# Patient Record
Sex: Female | Born: 1960 | Race: White | Hispanic: No | Marital: Married | Smoking: Former smoker
Health system: Southern US, Community
[De-identification: ages and names within clinical notes are randomized; demographics above are authoritative.]

## PROBLEM LIST (undated history)

## (undated) DIAGNOSIS — U071 COVID-19: Secondary | ICD-10-CM

## (undated) DIAGNOSIS — J309 Allergic rhinitis, unspecified: Principal | ICD-10-CM

## (undated) DIAGNOSIS — B019 Varicella without complication: Secondary | ICD-10-CM

## (undated) DIAGNOSIS — R490 Dysphonia: Principal | ICD-10-CM

## (undated) DIAGNOSIS — M069 Rheumatoid arthritis, unspecified: Secondary | ICD-10-CM

## (undated) DIAGNOSIS — H6980 Other specified disorders of Eustachian tube, unspecified ear: Secondary | ICD-10-CM

## (undated) DIAGNOSIS — I10 Essential (primary) hypertension: Secondary | ICD-10-CM

## (undated) DIAGNOSIS — N393 Stress incontinence (female) (male): Secondary | ICD-10-CM

## (undated) DIAGNOSIS — R739 Hyperglycemia, unspecified: Secondary | ICD-10-CM

## (undated) DIAGNOSIS — Z0000 Encounter for general adult medical examination without abnormal findings: Secondary | ICD-10-CM

## (undated) DIAGNOSIS — G43109 Migraine with aura, not intractable, without status migrainosus: Secondary | ICD-10-CM

## (undated) DIAGNOSIS — K219 Gastro-esophageal reflux disease without esophagitis: Secondary | ICD-10-CM

## (undated) DIAGNOSIS — N3281 Overactive bladder: Secondary | ICD-10-CM

## (undated) DIAGNOSIS — E213 Hyperparathyroidism, unspecified: Secondary | ICD-10-CM

## (undated) DIAGNOSIS — E785 Hyperlipidemia, unspecified: Secondary | ICD-10-CM

## (undated) DIAGNOSIS — R05 Cough: Secondary | ICD-10-CM

## (undated) DIAGNOSIS — R51 Headache: Secondary | ICD-10-CM

## (undated) DIAGNOSIS — F4310 Post-traumatic stress disorder, unspecified: Secondary | ICD-10-CM

## (undated) DIAGNOSIS — R319 Hematuria, unspecified: Secondary | ICD-10-CM

## (undated) DIAGNOSIS — R000 Tachycardia, unspecified: Secondary | ICD-10-CM

## (undated) DIAGNOSIS — C50919 Malignant neoplasm of unspecified site of unspecified female breast: Secondary | ICD-10-CM

## (undated) DIAGNOSIS — E669 Obesity, unspecified: Secondary | ICD-10-CM

## (undated) DIAGNOSIS — Z87891 Personal history of nicotine dependence: Secondary | ICD-10-CM

## (undated) DIAGNOSIS — R945 Abnormal results of liver function studies: Secondary | ICD-10-CM

## (undated) HISTORY — DX: Encounter for general adult medical examination without abnormal findings: Z00.00

## (undated) HISTORY — DX: COVID-19: U07.1

## (undated) HISTORY — PX: OTHER SURGICAL HISTORY: SHX169

## (undated) HISTORY — PX: BREAST EXCISIONAL BIOPSY: SUR124

## (undated) HISTORY — DX: Headache: R51

## (undated) HISTORY — PX: FOOT SURGERY: SHX648

## (undated) HISTORY — DX: Hyperglycemia, unspecified: R73.9

## (undated) HISTORY — DX: Post-traumatic stress disorder, unspecified: F43.10

## (undated) HISTORY — DX: Malignant neoplasm of unspecified site of unspecified female breast: C50.919

## (undated) HISTORY — DX: Varicella without complication: B01.9

## (undated) HISTORY — DX: Allergic rhinitis, unspecified: J30.9

## (undated) HISTORY — DX: Rheumatoid arthritis, unspecified: M06.9

## (undated) HISTORY — DX: Dysphonia: R49.0

## (undated) HISTORY — PX: TUBAL LIGATION: SHX77

## (undated) HISTORY — DX: Migraine with aura, not intractable, without status migrainosus: G43.109

## (undated) HISTORY — DX: Stress incontinence (female) (male): N39.3

## (undated) HISTORY — PX: WISDOM TOOTH EXTRACTION: SHX21

## (undated) HISTORY — DX: Hyperlipidemia, unspecified: E78.5

## (undated) HISTORY — PX: BREAST SURGERY: SHX581

## (undated) HISTORY — DX: Gastro-esophageal reflux disease without esophagitis: K21.9

## (undated) HISTORY — DX: Hematuria, unspecified: R31.9

## (undated) HISTORY — DX: Personal history of nicotine dependence: Z87.891

## (undated) HISTORY — DX: Cough: R05

## (undated) HISTORY — DX: Overactive bladder: N32.81

## (undated) HISTORY — DX: Obesity, unspecified: E66.9

## (undated) HISTORY — DX: Other specified disorders of Eustachian tube, unspecified ear: H69.80

## (undated) HISTORY — DX: Tachycardia, unspecified: R00.0

## (undated) HISTORY — DX: Abnormal results of liver function studies: R94.5

## (undated) HISTORY — DX: Essential (primary) hypertension: I10

---

## 2000-04-30 ENCOUNTER — Other Ambulatory Visit: Admission: RE | Admit: 2000-04-30 | Discharge: 2000-04-30 | Payer: Self-pay | Admitting: Obstetrics & Gynecology

## 2001-03-19 ENCOUNTER — Ambulatory Visit (HOSPITAL_COMMUNITY): Admission: RE | Admit: 2001-03-19 | Discharge: 2001-03-19 | Payer: Self-pay | Admitting: General Surgery

## 2001-06-23 ENCOUNTER — Other Ambulatory Visit: Admission: RE | Admit: 2001-06-23 | Discharge: 2001-06-23 | Payer: Self-pay | Admitting: Family Medicine

## 2002-08-10 ENCOUNTER — Other Ambulatory Visit: Admission: RE | Admit: 2002-08-10 | Discharge: 2002-08-10 | Payer: Self-pay | Admitting: Obstetrics and Gynecology

## 2003-07-27 ENCOUNTER — Encounter: Admission: RE | Admit: 2003-07-27 | Discharge: 2003-07-27 | Payer: Self-pay | Admitting: General Surgery

## 2003-08-23 ENCOUNTER — Other Ambulatory Visit: Admission: RE | Admit: 2003-08-23 | Discharge: 2003-08-23 | Payer: Self-pay | Admitting: Obstetrics and Gynecology

## 2004-01-05 ENCOUNTER — Ambulatory Visit (HOSPITAL_COMMUNITY): Admission: RE | Admit: 2004-01-05 | Discharge: 2004-01-05 | Payer: Self-pay | Admitting: Obstetrics and Gynecology

## 2004-03-10 HISTORY — PX: PELVIC LAPAROSCOPY: SHX162

## 2004-10-03 ENCOUNTER — Encounter: Admission: RE | Admit: 2004-10-03 | Discharge: 2004-10-03 | Payer: Self-pay | Admitting: Family Medicine

## 2004-12-09 ENCOUNTER — Other Ambulatory Visit: Admission: RE | Admit: 2004-12-09 | Discharge: 2004-12-09 | Payer: Self-pay | Admitting: Obstetrics and Gynecology

## 2006-01-16 ENCOUNTER — Encounter: Admission: RE | Admit: 2006-01-16 | Discharge: 2006-01-16 | Payer: Self-pay | Admitting: Obstetrics and Gynecology

## 2006-03-10 LAB — HM COLONOSCOPY

## 2006-08-14 ENCOUNTER — Emergency Department (HOSPITAL_COMMUNITY): Admission: EM | Admit: 2006-08-14 | Discharge: 2006-08-14 | Payer: Self-pay | Admitting: Emergency Medicine

## 2008-03-22 ENCOUNTER — Encounter: Admission: RE | Admit: 2008-03-22 | Discharge: 2008-03-22 | Payer: Self-pay | Admitting: Obstetrics and Gynecology

## 2008-06-16 ENCOUNTER — Encounter: Payer: Self-pay | Admitting: Cardiovascular Disease

## 2008-09-28 ENCOUNTER — Ambulatory Visit: Payer: Self-pay | Admitting: Sports Medicine

## 2008-09-28 DIAGNOSIS — M216X9 Other acquired deformities of unspecified foot: Secondary | ICD-10-CM | POA: Insufficient documentation

## 2008-09-28 DIAGNOSIS — IMO0002 Reserved for concepts with insufficient information to code with codable children: Secondary | ICD-10-CM | POA: Insufficient documentation

## 2008-09-28 DIAGNOSIS — M25569 Pain in unspecified knee: Secondary | ICD-10-CM | POA: Insufficient documentation

## 2008-10-05 ENCOUNTER — Ambulatory Visit: Payer: Self-pay | Admitting: Sports Medicine

## 2009-04-17 ENCOUNTER — Encounter: Admission: RE | Admit: 2009-04-17 | Discharge: 2009-04-17 | Payer: Self-pay | Admitting: Obstetrics and Gynecology

## 2009-08-17 ENCOUNTER — Encounter: Payer: Self-pay | Admitting: Cardiovascular Disease

## 2010-03-10 LAB — HM PAP SMEAR

## 2010-03-20 ENCOUNTER — Encounter
Admission: RE | Admit: 2010-03-20 | Discharge: 2010-03-20 | Payer: Self-pay | Source: Home / Self Care | Attending: Obstetrics and Gynecology | Admitting: Obstetrics and Gynecology

## 2010-03-31 ENCOUNTER — Encounter: Payer: Self-pay | Admitting: Obstetrics and Gynecology

## 2010-04-11 NOTE — Assessment & Plan Note (Signed)
Summary: FU KNEE PAIN/JW   Vital Signs:  Patient profile:   50 year old female Height:      66 inches Weight:      170 pounds BMI:     27.54 BP sitting:   100 / 72  Vitals Entered By: Lillia Pauls CMA (October 05, 2008 10:16 AM)  History of Present Illness: Patient is much better after bilat poes anserine injections last week she wants to get back to exercise she notes first day she cut her work shift to 8 hrs by second day able to go 12 hrs now walking and has done a little of elliptical without much pain has not run  comes for reck  Physical Exam  General:  Well-developed,well-nourished,in no acute distress; alert,appropriate and cooperative throughout examination Msk:  RT knee exam shows no effusion; stable ligaments; negative Mcmurray's and provocative meniscal tests; non painful patellar compression; patellar and quadriceps tendons unremarkable mild swellin over pes anserine bursa persists with slight TTP  LT knee exam shows no effusion; stable ligaments; negative Mcmurray's and provocative meniscal tests; non painful patellar compression; patellar and quadriceps tendons unremarkable also with some swelling over pes anserine but not warm and minimal TTP   good strength on hip adduction, abduction and flesion  good quad strength  sartorius strength slighlty donw bue ? 2/2 pain   Impression & Recommendations:  Problem # 1:  KNEE PAIN, BILATERAL (ICD-719.46) This has resolved down to a mild level 2/10 or so does not hurt a lot of time too much standing or exercise will bring this out  Problem # 2:  ANSERINE BURSITIS (ICD-726.61) Much improived signs wise after injections still slight swelling  will give easy exercise program hamstrings quads sartorius hip abductors and adductors  she may choose to go back to elliptical primarily  cont use somforthotic insoles with scaphoid pads  RTC as needed

## 2010-07-03 ENCOUNTER — Encounter: Payer: Self-pay | Admitting: Cardiovascular Disease

## 2010-07-26 NOTE — Op Note (Signed)
[name], [name]             ACCOUNT NO.:  [id number]   MEDICAL RECORD NO.:  [id number]          PATIENT TYPE:  AMB   LOCATION:  SDC                           FACILITY:  [facility]   PHYSICIAN:  Zenaida Niece, M.D.DATE OF BIRTH:  10-30-1960   DATE OF PROCEDURE:  01/05/2004  DATE OF DISCHARGE:                                 OPERATIVE REPORT   PREOPERATIVE DIAGNOSES:  Pelvic pain.   POSTOPERATIVE DIAGNOSES:  Pelvic pain.   PROCEDURE:  Diagnostic laparoscopy.   SURGEON:  Zenaida Niece, M.D.   ANESTHESIA:  General endotracheal.   ESTIMATED BLOOD LOSS:  Less than 50 mL.   FINDINGS:  She had minimal adhesions of the sigmoid colon to the left pelvic  brim and the omentum was adherent just to beneath the umbilicus. She had a  normal appendix, liver edge and gallbladder.  Pelvic anatomy was otherwise  very normal with evidence of prior tubal ligation.   DESCRIPTION OF PROCEDURE:  The patient was taken to the operating room and  placed in the dorsal supine position.  General anesthesia was induced and  she was placed in mobile stirrups. Abdomen was prepped and draped in the  usual sterile fashion, bladder drained with a red rubber catheter, Hulka  tenaculum applied to the cervix for uterine manipulation. Infraumbilical  skin was then infiltrated with Marcaine 0.25% and a 1-1/2 cm horizontal  incision was made in her previous scar.  The Veress needle was inserted into  the peritoneal cavity and placement confirmed by the water drop test and an  opening pressure of 3 mmHg.  CO2 gas was insufflated to a pressure of 12  mmHg and the Veress needle was removed. The 10/11 disposable trocar was then  introduced and placement confirmed by the laparoscope. Inspection revealed  the above mentioned findings. Both tubes and ovaries, ovarian fossa,  anterior and posterior cul-de-sacs and the uterus appeared very normal. I  was able to find no significant source for her pain. There were a  few  adhesions of the sigmoid to the left pelvic brim and these were taken down  with scissors.  Anatomy otherwise looked normal. I was able to just  visualize an adhesion of the omentum to the left of the umbilicus. The  laparoscope was removed and all gas allowed to deflate from the abdomen. The  umbilical trocar was removed. The fascia was closed with a figure-of-eight  suture of #0 Vicryl and skin was closed with interrupted subcuticular  sutures of 4-0 Vicryl followed by Steri-Strips and a Band-Aid.  Hulka  tenaculum was then removed from the cervix. The patient was extubated in the  operating room and taken to the recovery room in stable condition after  tolerating the procedure well.  Counts were correct and she had PAS hose on  throughout the procedure.     Todd   TDM/MEDQ  D:  01/05/2004  T:  01/05/2004  Job:  [id number]

## 2010-07-26 NOTE — Op Note (Signed)
[facility]  Patient:    Sandra [name], Sandra [name] Visit Number: [id number] MRN: [id number]          Service Type: DSU Location: DAY Attending Physician:  Cherylynn Ridges Proc. 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  PROCEDURE:  Fistulectomy.  SURGEON:  Jimmye Norman, M.D.  ASSISTANT:  None.  ANESTHESIA:  General endotracheal.  ESTIMATED BLOOD LOSS:  Less than 10 cc.  COMPLICATIONS:  None.  CONDITION:  Stable.  INDICATIONS FOR OPERATION:  The patient is a 50 year old female where for a current abscess in the 7 oclock position, currently responding to antibiotics.  She now comes in for fistula in ano repair.  FINDINGS:  The patient had a indurated cavity at the 7 oclock position, which was open and tracked into sinus that coursed somewhat superiorly and then laterally along the patients left side, up to an anal crypt at approximately the 10 oclock position.  DESCRIPTION OF OPERATION:  The patient was taken to the operating room and placed on the table initially in the supine position.  After an adequate endotracheal anesthetic was administered, she was flipped into the jackknife prone position for surgery.  She was then prepped in the usual sterile manner.  An anal retractor was placed as we inspected the indurated fluctuant area in the 7 oclock position.  Using an anal hook, we could identify a crypt perforation only at approximately 10 oclock position.  We opened the cavity externally on the skin and the perianal area; we were able to get into a cavity which coursed superiorly and then somewhat to the patients left laterally, up to an area identified by the  crypt and approximately in the 10 oclock position.   This whole tract was scarred and indurated, and we used a curet to clean out the scarred out until we had opened up the tract using electrocautery.  We obtained hemostasis with cautery and also with a suture ligature of 3-0 Vicryl and 3-0 chromic.  The entire indurated tract was scraped with a curet.  Care was taken to spare the external sphincter muscle; however, we did probably cut across at least in one spot.  The length of the opening was approximately 3 cm long.  We irrigated with saline, then we packed it with the Gelfoam wrapped in bupivacaine.  All sponge, needle and instrument counts were correct.  We also injected .25% Marcaine with epinephrine.Attending Physician:  Cherylynn Ridges DD:  03/19/01 TD:  03/20/01 Job: [id number]

## 2010-07-26 NOTE — H&P (Signed)
Sandra [name], MODESITT             ACCOUNT NO.:  [id number]   MEDICAL RECORD NO.:  [id number]          PATIENT TYPE:  AMB   LOCATION:  SDC                           FACILITY:  [facility]   PHYSICIAN:  Zenaida Niece, M.D.DATE OF BIRTH:  06-23-1960   DATE OF ADMISSION:  DATE OF DISCHARGE:                                HISTORY & PHYSICAL   DATE OF ADMISSION:  January 05, 2004   CHIEF COMPLAINT:  Pelvic pain.   HISTORY OF PRESENT ILLNESS:  This is a 50 year old white female para 2-0-1-2  whom I first saw in June 2005 for pelvic pain.  At that time she had had  pain for approximately 2 months that was fairly constant and worse with  prolonged sitting, and worse with a full rectum and slightly better after  bowel movements.  She also had some perineal pressure.  She had no  dyspareunia.  Exam revealed a slightly tender deep left posterior fornix but  an otherwise normal exam.  She was then treated with Lupron Depot 3 month  11.25 mg for the possibility of endometriosis.  She was treated with Prempro  0.3/1.5 for menopausal symptoms from the Lupron.  On follow-up on September  29 she reported that she had initially a fair amount of improvement in her  pain but now there was minimal improvement in her pain.  All options were  discussed and the patient wishes to proceed with laparoscopic evaluation.   PAST MEDICAL HISTORY:  Significant for osteopenia and a history of  microscopic hematuria for which she has been seen by Dr. Vernie Ammons.   OBSTETRICAL HISTORY:  One spontaneous abortion treated with a D&C and two  vaginal deliveries at term without complications.   SURGERIES:  Tubal ligation, perineal fistula repair, and left breast biopsy.   ALLERGIES:  PENICILLIN and SULFA.   CURRENT MEDICATIONS:  Prempro 0.45/1.5.   GYNECOLOGICAL HISTORY:  No history of abnormal Pap smears.   SOCIAL HISTORY:  The patient is married, has quit smoking, and denies  alcohol or drug use.  The patient is an OR  nurse at [employer].   FAMILY HISTORY:  Maternal great aunt with breast cancer and her mother had  renal cell carcinoma.   PHYSICAL EXAMINATION:  GENERAL:  This is a well-developed white female in no  acute distress.  VITAL SIGNS:  Weight is 156 pounds.  Vital signs are stable.  NECK:  Supple without lymphadenopathy or thyromegaly.  LUNGS:  Clear to auscultation.  HEART:  Regular rate and rhythm without murmur.  ABDOMEN:  Soft, nontender, nondistended, without palpable masses.  EXTREMITIES:  No edema and are nontender.  PELVIC:  External genitalia is normal.  On speculum exam, cervix is normal  and Pap smear was performed which has returned normal.  On bimanual exam,  she has a small, anteverted, nontender uterus and no palpable masses.  However, she is tender in the left posterior cul-de-sac.   ASSESSMENT:  Chronic pelvic pain which has not responded to Lupron Depot.  All surgical and nonsurgical options have been discussed with the patient  and she wishes to proceed  with laparoscopic evaluation.  Risks of surgery  including bleeding, infection, and damage to surrounding organs have been  discussed and she understands.   PLAN:  Admit the patient for a diagnostic laparoscopy with possible  adhesiolysis and possible fulguration of endometriosis.     Todd   TDM/MEDQ  D:  01/04/2004  T:  01/04/2004  Job:  [id number]

## 2010-09-16 ENCOUNTER — Ambulatory Visit: Payer: 59 | Attending: Orthopedic Surgery | Admitting: Physical Therapy

## 2010-09-16 DIAGNOSIS — M25669 Stiffness of unspecified knee, not elsewhere classified: Secondary | ICD-10-CM | POA: Insufficient documentation

## 2010-09-16 DIAGNOSIS — M25569 Pain in unspecified knee: Secondary | ICD-10-CM | POA: Insufficient documentation

## 2010-09-16 DIAGNOSIS — IMO0001 Reserved for inherently not codable concepts without codable children: Secondary | ICD-10-CM | POA: Insufficient documentation

## 2010-09-18 ENCOUNTER — Ambulatory Visit: Payer: 59 | Admitting: Rehabilitation

## 2010-09-25 ENCOUNTER — Ambulatory Visit: Payer: 59 | Admitting: Physical Therapy

## 2010-09-26 ENCOUNTER — Encounter: Payer: Commercial Managed Care - PPO | Admitting: Rehabilitation

## 2010-09-30 ENCOUNTER — Ambulatory Visit: Payer: 59 | Admitting: Physical Therapy

## 2010-10-02 ENCOUNTER — Ambulatory Visit: Payer: 59 | Admitting: Rehabilitation

## 2010-10-07 ENCOUNTER — Ambulatory Visit: Payer: 59 | Admitting: Rehabilitation

## 2010-10-08 ENCOUNTER — Ambulatory Visit: Payer: 59 | Admitting: Rehabilitation

## 2010-10-15 ENCOUNTER — Ambulatory Visit: Payer: 59 | Attending: Family Medicine | Admitting: Rehabilitation

## 2010-10-15 DIAGNOSIS — IMO0001 Reserved for inherently not codable concepts without codable children: Secondary | ICD-10-CM | POA: Insufficient documentation

## 2010-10-15 DIAGNOSIS — M25569 Pain in unspecified knee: Secondary | ICD-10-CM | POA: Insufficient documentation

## 2010-10-15 DIAGNOSIS — M25669 Stiffness of unspecified knee, not elsewhere classified: Secondary | ICD-10-CM | POA: Insufficient documentation

## 2010-10-16 ENCOUNTER — Ambulatory Visit: Payer: 59 | Admitting: Rehabilitation

## 2010-12-26 LAB — CBC
HCT: 38.5
Hemoglobin: 13.1
MCHC: 33.9
MCV: 88.3
Platelets: 235
RBC: 4.36
RDW: 13.2
WBC: 4.4

## 2010-12-26 LAB — POCT CARDIAC MARKERS
CKMB, poc: 1.5
Myoglobin, poc: 57.1
Operator id: 4295
Troponin i, poc: <0.05

## 2010-12-26 LAB — DIFFERENTIAL
Basophils Absolute: 0
Basophils Relative: 0
Eosinophils Absolute: 0
Eosinophils Relative: 0
Lymphocytes Relative: 32
Lymphs Abs: 1.4
Monocytes Absolute: 0.7
Monocytes Relative: 17 — ABNORMAL HIGH
Neutro Abs: 2.3
Neutrophils Relative %: 51

## 2010-12-26 LAB — COMPREHENSIVE METABOLIC PANEL
ALT: 22
AST: 21
Albumin: 3.9
Alkaline Phosphatase: 61
BUN: 8
CO2: 27
Calcium: 10
Chloride: 106
Creatinine, Ser: 0.72
GFR calc Af Amer: >60
GFR calc non Af Amer: >60
Glucose, Bld: 93
Potassium: 3.8
Sodium: 138
Total Bilirubin: 0.6
Total Protein: 6.7

## 2010-12-26 LAB — D-DIMER, QUANTITATIVE: D-Dimer, Quant: <0.22

## 2011-01-01 ENCOUNTER — Encounter: Payer: Self-pay | Admitting: Cardiovascular Disease

## 2011-01-02 ENCOUNTER — Encounter: Payer: Self-pay | Admitting: Cardiovascular Disease

## 2011-01-02 ENCOUNTER — Ambulatory Visit (INDEPENDENT_AMBULATORY_CARE_PROVIDER_SITE_OTHER): Payer: 59 | Admitting: Cardiovascular Disease

## 2011-01-02 VITALS — BP 120/80 | HR 80 | Resp 18 | Ht 65.0 in | Wt 162.8 lb

## 2011-01-02 DIAGNOSIS — I10 Essential (primary) hypertension: Secondary | ICD-10-CM

## 2011-01-02 DIAGNOSIS — E785 Hyperlipidemia, unspecified: Secondary | ICD-10-CM

## 2011-01-02 DIAGNOSIS — R072 Precordial pain: Secondary | ICD-10-CM

## 2011-01-02 DIAGNOSIS — R079 Chest pain, unspecified: Secondary | ICD-10-CM

## 2011-01-02 NOTE — Patient Instructions (Signed)
Your physician recommends that you schedule a follow-up appointment as needed.   Your physician has requested that you have a stress echocardiogram. For further information please visit [email or web address] Please follow instruction sheet as given.

## 2011-01-03 ENCOUNTER — Encounter: Payer: Self-pay | Admitting: Cardiovascular Disease

## 2011-01-03 DIAGNOSIS — I10 Essential (primary) hypertension: Secondary | ICD-10-CM | POA: Insufficient documentation

## 2011-01-03 DIAGNOSIS — E785 Hyperlipidemia, unspecified: Secondary | ICD-10-CM | POA: Insufficient documentation

## 2011-01-03 DIAGNOSIS — R0789 Other chest pain: Secondary | ICD-10-CM | POA: Insufficient documentation

## 2011-01-03 NOTE — Assessment & Plan Note (Signed)
Blood pressure is well controlled on lisinopril hydrochlorothiazide

## 2011-01-03 NOTE — Assessment & Plan Note (Signed)
The patient is on a statin drug. I do not have a copy of her lipids but she tells me they are well treated.

## 2011-01-03 NOTE — Progress Notes (Signed)
HPI:  This is a 49 year old woman presenting for initial evaluation of chest pain. She describes progressive symptoms of right sided chest and shoulder discomfort that feels like a "pressure."  The patient also describes this as a "dull pain." The pain is not affected by arm movement, coughing, or deep breathing. It is not clearly exertional in nature but occasionally occurs with stress and exertion. She denies substernal or left sided chest pain. However, she recently had an episode with a "squeezing" sensation in her left upper arm other associated symptoms include palpitations, headaches, lightheadedness, and shortness of breath. All these symptoms have been frequent over the past month. The patient has had no evaluation of these symptoms up until now. She works as an Producer, television/film/video at [employer].  Outpatient Encounter Prescriptions as of 01/02/2011  Medication Sig Dispense Refill  . aspirin 81 MG tablet Take 81 mg by mouth daily.        Marland Kitchen lisinopril-hydrochlorothiazide (PRINZIDE,ZESTORETIC) 10-12.5 MG per tablet Take 1 tablet by mouth daily.        . simvastatin (ZOCOR) 40 MG tablet Take 40 mg by mouth at bedtime.          Penicillins  Past Medical History  Diagnosis Date  . Hypertension   . Chest pain   . Hyperlipidemia   . Rapid heart beat     No past surgical history on file.  History   Social History  . Marital Status: Married    Spouse Name: N/A    Number of Children: 2  . Years of Education: N/A   Occupational History  . RN [employer]   Social History Main Topics  . Smoking status: Former Smoker    Quit date: 03/10/2005  . Smokeless tobacco: Not on file  . Alcohol Use: No  . Drug Use: No  . Sexually Active: Not on file   Other Topics Concern  . Not on file   Social History Narrative  . No narrative on file    Family History  Problem Relation Age of Onset  . Hypertension Mother 30    alive  . Hypertension Father 85    alive  . Arrhythmia  Father   . Hypertension Brother 56    alive  . Hypertension Sister     2    ROS: General: no fevers/chills/night sweats, positive for hot flashes Eyes: no blurry vision, diplopia, or amaurosis ENT: no sore throat or hearing loss, positive for seasonal allergies with rhinorrhea Resp: no cough, wheezing, or hemoptysis CV: no edema or palpitations GI: no abdominal pain, nausea, vomiting, diarrhea, or constipation GU: no dysuria, frequency, or hematuria Skin: no rash Neuro: Positive for headache, numbness, tingling, or weakness of extremities Musculoskeletal: no joint pain or swelling Heme: no bleeding, DVT, or easy bruising Endo: no polydipsia or polyuria  BP 120/80  Pulse 80  Resp 18  Ht 5\' 5"  (1.651 m)  Wt 162 lb 12.8 oz (73.846 kg)  BMI 27.09 kg/m2  PHYSICAL EXAM: Pt is alert and oriented, WD, WN, in no distress. HEENT: normal Neck: JVP normal. Carotid upstrokes normal without bruits. No thyromegaly. Lungs: equal expansion, clear bilaterally CV: Apex is discrete and nondisplaced, RRR without murmur or gallop Abd: soft, NT, +BS, no bruit, no hepatosplenomegaly Back: no CVA tenderness Ext: no C/C/E        Femoral pulses 2+= without bruits        DP/PT pulses intact and = Skin: warm and dry without rash Neuro: CNII-XII  intact             Strength intact = bilaterally  EKG:  Normal sinus rhythm with sinus arrhythmia 78 beats per minute, within normal limits.  ASSESSMENT AND PLAN:

## 2011-01-03 NOTE — Assessment & Plan Note (Signed)
The patient has chest pain with typical and atypical features. Her electrocardiogram is within normal limits. She does have significant cardiovascular risk factors include hyperlipidemia, essential hypertension, and a 20-pack-year history of tobacco. She does not have a premature family history of coronary artery disease or diabetes. I have recommended a stress echocardiogram to evaluate for myocardial ischemia for evaluation of her progressive chest pain syndrome and shortness of breath. She will continue on her medical regimen which includes aspirin, simvastatin, and lisinopril.

## 2011-01-16 ENCOUNTER — Ambulatory Visit (HOSPITAL_COMMUNITY): Payer: 59 | Attending: Internal Medicine | Admitting: Radiology

## 2011-01-16 ENCOUNTER — Ambulatory Visit (HOSPITAL_BASED_OUTPATIENT_CLINIC_OR_DEPARTMENT_OTHER): Payer: 59 | Admitting: Radiology

## 2011-01-16 DIAGNOSIS — R079 Chest pain, unspecified: Secondary | ICD-10-CM

## 2011-01-16 DIAGNOSIS — R0602 Shortness of breath: Secondary | ICD-10-CM | POA: Insufficient documentation

## 2011-01-16 DIAGNOSIS — R42 Dizziness and giddiness: Secondary | ICD-10-CM | POA: Insufficient documentation

## 2011-01-16 DIAGNOSIS — I10 Essential (primary) hypertension: Secondary | ICD-10-CM | POA: Insufficient documentation

## 2011-01-16 DIAGNOSIS — R0989 Other specified symptoms and signs involving the circulatory and respiratory systems: Secondary | ICD-10-CM

## 2011-01-16 DIAGNOSIS — R002 Palpitations: Secondary | ICD-10-CM | POA: Insufficient documentation

## 2011-01-16 DIAGNOSIS — M79609 Pain in unspecified limb: Secondary | ICD-10-CM | POA: Insufficient documentation

## 2011-01-16 DIAGNOSIS — Z87891 Personal history of nicotine dependence: Secondary | ICD-10-CM | POA: Insufficient documentation

## 2011-01-16 DIAGNOSIS — R072 Precordial pain: Secondary | ICD-10-CM | POA: Insufficient documentation

## 2011-01-16 DIAGNOSIS — E785 Hyperlipidemia, unspecified: Secondary | ICD-10-CM | POA: Insufficient documentation

## 2011-02-06 ENCOUNTER — Telehealth: Payer: Self-pay | Admitting: Cardiovascular Disease

## 2011-02-06 NOTE — Telephone Encounter (Signed)
Left message for pt to call back  °

## 2011-02-06 NOTE — Telephone Encounter (Signed)
FU Call: Pt returning calling regarding results of pt stress test. Please call pt after 2 pm.

## 2011-02-06 NOTE — Telephone Encounter (Signed)
Pt aware of stress echo by phone.

## 2011-03-11 LAB — HM MAMMOGRAPHY

## 2011-03-19 ENCOUNTER — Other Ambulatory Visit: Payer: Self-pay | Admitting: Obstetrics and Gynecology

## 2011-03-19 DIAGNOSIS — Z1231 Encounter for screening mammogram for malignant neoplasm of breast: Secondary | ICD-10-CM

## 2011-04-24 ENCOUNTER — Ambulatory Visit
Admission: RE | Admit: 2011-04-24 | Discharge: 2011-04-24 | Disposition: A | Discharge status: Home / Self Care | Payer: 59 | Source: Ambulatory Visit | Attending: Obstetrics and Gynecology | Admitting: Obstetrics and Gynecology

## 2011-04-24 DIAGNOSIS — Z1231 Encounter for screening mammogram for malignant neoplasm of breast: Secondary | ICD-10-CM

## 2011-06-11 ENCOUNTER — Encounter: Payer: Self-pay | Admitting: Sports Medicine

## 2011-06-11 ENCOUNTER — Ambulatory Visit (INDEPENDENT_AMBULATORY_CARE_PROVIDER_SITE_OTHER): Payer: 59 | Admitting: Sports Medicine

## 2011-06-11 VITALS — BP 122/79 | HR 82

## 2011-06-11 DIAGNOSIS — M719 Bursopathy, unspecified: Secondary | ICD-10-CM

## 2011-06-11 DIAGNOSIS — M75101 Unspecified rotator cuff tear or rupture of right shoulder, not specified as traumatic: Secondary | ICD-10-CM | POA: Insufficient documentation

## 2011-06-11 DIAGNOSIS — M67919 Unspecified disorder of synovium and tendon, unspecified shoulder: Secondary | ICD-10-CM

## 2011-06-11 DIAGNOSIS — M25519 Pain in unspecified shoulder: Secondary | ICD-10-CM

## 2011-06-11 DIAGNOSIS — M25511 Pain in right shoulder: Secondary | ICD-10-CM

## 2011-06-11 MED ORDER — MELOXICAM 15 MG PO TABS: 15.0000 mg | ORAL_TABLET | Freq: Every day | ORAL | Status: DC

## 2011-06-11 NOTE — Assessment & Plan Note (Signed)
Start meloxicam on a regular basis for 2 weeks  Do range of motion exercises and be careful with lifting  Ice or heat during the evenings  Recheck in 2 weeks

## 2011-06-11 NOTE — Assessment & Plan Note (Signed)
We will limit her to do range of motion exercises only and he'll were sure she's not getting signs of a frozen shoulder  On her return we should decide whether to move ahead with injection or nitroglycerin if she has not improved adequately

## 2011-06-11 NOTE — Progress Notes (Signed)
  Subjective:    Patient ID: Sandra [name], female    DOB: [birth year], 51 y.o.   MRN: [id number]  HPI  Pt presents to clinic for eval of rt shoulder pain x 1 month. Does not recall an injury, but pain is gradually getting worse. Shoulder pain is anterior and posterior with elevation and abduction. Pain also radiates down to elbow, and she has recently noticed tingling in her rt hand. 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 Shoulder pain is worst at the end of the day, hard to fall asleep due to pain.  Aleve has been slightly helpful for pain.    Review of Systems     Objective:   Physical Exam  Full neck ROM Painful arc at 70-90 degrees on rt Lacks 20 deg of full abduction and elevation Slight limitation of forward flexion and IR Speed's neg bilat Yergaon's very painful on rt Empty can very pain on rt IR and ER strong, but ER was painful on rt Hawkins painful on rt    MSK [place] Bicipital tendon shows some increased fluid at the superior area  Supraspinatous tendon is intact Subdeltoid bursa is swollen A.c. joint shows some moderate arthritis Infraspinatus and teres minor tendons are normal Subscapularis tendon is difficult to visualize and may have some calcifications      Assessment & Plan:

## 2011-06-11 NOTE — Patient Instructions (Addendum)
Please take mobic once daily for the next 2 weeks  Do range of motion shoulder exercises daily, use broom stick to stretch shoulder on pulley exercise  It is ok to use ice or heat for comfort   Please follow up in 2 weeks   Thank you for seeing [place] today!

## 2011-06-18 ENCOUNTER — Ambulatory Visit: Payer: 59 | Admitting: Sports Medicine

## 2011-06-20 ENCOUNTER — Ambulatory Visit (INDEPENDENT_AMBULATORY_CARE_PROVIDER_SITE_OTHER): Payer: 59 | Admitting: Family Medicine

## 2011-06-20 VITALS — BP 121/85

## 2011-06-20 DIAGNOSIS — M25511 Pain in right shoulder: Secondary | ICD-10-CM

## 2011-06-20 DIAGNOSIS — M67919 Unspecified disorder of synovium and tendon, unspecified shoulder: Secondary | ICD-10-CM

## 2011-06-20 DIAGNOSIS — M25519 Pain in unspecified shoulder: Secondary | ICD-10-CM

## 2011-06-20 DIAGNOSIS — M75101 Unspecified rotator cuff tear or rupture of right shoulder, not specified as traumatic: Secondary | ICD-10-CM

## 2011-06-20 NOTE — Progress Notes (Signed)
  Subjective:    Patient ID: Sandra [name], female    DOB: 1960-05-13, 51 y.o.   MRN: [id number]  HPI Shoulder pain about the last month. Was seen one week ago and started on some pendulum exercises and wall crawls. Is having continued pain so bad that she has difficulty sleeping at night. Pain with any forward flexion or lateral raise. Reports no specific injury that started this. Gradual in onset. Pain is 5-6/10 currently. Pain radiates into the upper deltoid and down to the elbow. Right hand dominant.   PERTINENT  PMH / PSH: Hypertension. She is on aspirin. Review of Systems    denies fever. Has noted no redness or warmth of the shoulder joint or right upper extremity. Objective:   Physical Exam  Vital signs reviewed. GENERAL: Well developed, well nourished, no acute distress SHOULDER: Right. Full range of motion but pain with any passive or active elevation above 80 in the forward flexion plane and 90 in the lateral raise plain. Pain with initiating placing her hand behind her back so we did not complete that exam. She has intact strength in all planes of the rotator cuff. Skin around the shoulder is without lesion, rash, warmth, erythema. IMAGING: Reviewed her images on the ultrasound from her last office visit. Rotator cuff muscles appear intact although the subscapularis muscle was not particularly well seen. There was some small amount of fluid around the biceps tendon. There were some calcifications and degenerative changes.      INJECTION: Patient was given informed consent, signed copy in the chart. Appropriate time out was taken. Area prepped and draped in usual sterile fashion. One cc of methylprednisolone 40 mg/ml plus  3 cc of 1% lidocaine  And 1 cc of bupivacaine without epinephrine was injected into the right subacromial space using a(n) posterior approach. The patient tolerated the procedure well. There were no complications. Post procedure instructions were  given.  Assessment & Plan:  Rotator cuff syndrome. Corticosteroid injection today. I will also increase her exercise regimen in the following manner. In one week she will add internal and external rotation with Thera-Band. I will not have her do supraspinatus strengthening at this time. She should gradually improve over the next one to 4 weeks. She does not I would have her return to clinic. Consider evaluation by x-ray of her shoulder looking for a acromion.

## 2011-06-30 ENCOUNTER — Encounter: Payer: Self-pay | Admitting: Sports Medicine

## 2011-06-30 ENCOUNTER — Ambulatory Visit (INDEPENDENT_AMBULATORY_CARE_PROVIDER_SITE_OTHER): Payer: 59 | Admitting: Sports Medicine

## 2011-06-30 ENCOUNTER — Ambulatory Visit: Payer: 59 | Admitting: Sports Medicine

## 2011-06-30 VITALS — BP 129/91 | HR 83

## 2011-06-30 DIAGNOSIS — M67919 Unspecified disorder of synovium and tendon, unspecified shoulder: Secondary | ICD-10-CM

## 2011-06-30 DIAGNOSIS — M75101 Unspecified rotator cuff tear or rupture of right shoulder, not specified as traumatic: Secondary | ICD-10-CM

## 2011-06-30 DIAGNOSIS — M719 Bursopathy, unspecified: Secondary | ICD-10-CM

## 2011-06-30 NOTE — Patient Instructions (Signed)
Continue with your exercises and come back to see [place] in two weeks.

## 2011-06-30 NOTE — Progress Notes (Signed)
Patient ID: Sandra [name], female   DOB: [birth year], 51 y.o.   MRN: [id number] Patient is a 51 year old female diagnosed with rotator cuff syndrome. She had an injection one week ago at the sports medicine clinic. She reports approximately a 70% improvement in her pain. The patient continues with her exercises and is also noticing improvement in her range of motion. She continues to take Mobic nightly.  Objective: General: Overweight female, well-developed, no distress Shoulder: Right shoulder has no pain on palpation and no deformity. Patient has 5 out of 5 strength to flexion, extension, and abduction. Resistance to either flexion or abduction does reproduce her pain. Range of motion testing shows that she now has 120 of abduction without pain to 90 of flexion without pain.  Assessment and plan: Rotator cuff syndrome of the right shoulder: We will continue with conservative treatment with exercises focusing on range of motion rather than strength. Patient is instructed to continue her Mobic nightly and return to clinic in 2 weeks. If she does not have a pain-free full range of motion at that time we will likely consider starting nitroglycerin patches. Otherwise we will plan to begin general strengthening exercises at that time.

## 2011-07-23 ENCOUNTER — Ambulatory Visit (INDEPENDENT_AMBULATORY_CARE_PROVIDER_SITE_OTHER): Payer: 59 | Admitting: Sports Medicine

## 2011-07-23 VITALS — BP 120/86

## 2011-07-23 DIAGNOSIS — M719 Bursopathy, unspecified: Secondary | ICD-10-CM

## 2011-07-23 DIAGNOSIS — M67919 Unspecified disorder of synovium and tendon, unspecified shoulder: Secondary | ICD-10-CM

## 2011-07-23 DIAGNOSIS — M25511 Pain in right shoulder: Secondary | ICD-10-CM

## 2011-07-23 DIAGNOSIS — M25519 Pain in unspecified shoulder: Secondary | ICD-10-CM

## 2011-07-23 DIAGNOSIS — M75101 Unspecified rotator cuff tear or rupture of right shoulder, not specified as traumatic: Secondary | ICD-10-CM

## 2011-07-23 NOTE — Assessment & Plan Note (Signed)
Rotator cuff impingement is significantly improvement today.  Encouraged patient to continue doing exercises. She has been doing them a few times daily.  May continue to take Alleve prn.  Discussed how patient may still have pain, especially with crossovers, due to [facility] joint arthritis.  Follow-up in 4 weeks.

## 2011-07-23 NOTE — Patient Instructions (Addendum)
Continue to do the exercises at least once a day:  -Pendulum exercises to loosen joint -Walk up wall -In and out -Theraband exercises: 90 deg and then higher as tolerated   Follow-up in 6 weeks.

## 2011-07-23 NOTE — Progress Notes (Signed)
  Subjective:    Patient ID: Sandra [name], female    DOB: January 12, 1961, 51 y.o.   MRN: [id number]  HPI This is a 51 year old female who presented with right shoulder pain in April 2013 and diagnosed with rotator cuff tendinopathy and AC arthritis.  She reports improvement in pain and increased mobility of shoulder.  She has been doing the rotator cuff exercises several times a day.  She takes an occasional Alleve for symptoms.   Review of Systems    Objective:   Physical Exam Gen: NAD, well-nourished, overweight Psych: engaged, appropriate to questions, alert and oriented MSK:   Right shoulder:     Inspection: no abnormality     Palpation: mild tenderness to palpation over upper inner arm     Sensation: intact     Strength: 4+/5 abduction, internal/external rotation of right shoulder compared to 5/5 on left     ROM: intact flexion, extension, abduction, adduction, external rotation, internal rotation less than on right (she is about 10 cm away from her scapula, whereas on left she is able to easily touch her scapula)     Maneuvers: negative on left side; right side:              Negative Neers       Positive Hawkins-Kennedy       Neg Speeds       Neg Yergeson       Positive cross-over    Assessment & Plan:

## 2011-07-24 NOTE — Assessment & Plan Note (Signed)
With her improvement in motion and strength we have less risk of frozen shoulder  Add some RC exercises for abduction and elevation

## 2011-08-27 ENCOUNTER — Ambulatory Visit (INDEPENDENT_AMBULATORY_CARE_PROVIDER_SITE_OTHER): Payer: 59 | Admitting: Sports Medicine

## 2011-08-27 VITALS — BP 126/82

## 2011-08-27 DIAGNOSIS — M67919 Unspecified disorder of synovium and tendon, unspecified shoulder: Secondary | ICD-10-CM

## 2011-08-27 DIAGNOSIS — M719 Bursopathy, unspecified: Secondary | ICD-10-CM

## 2011-08-27 DIAGNOSIS — M75101 Unspecified rotator cuff tear or rupture of right shoulder, not specified as traumatic: Secondary | ICD-10-CM

## 2011-08-27 NOTE — Assessment & Plan Note (Signed)
Improved status post injection home exercises. Continue home exercises as part of her daily routine. Come back to see [place] on an as-needed basis.

## 2011-08-27 NOTE — Progress Notes (Signed)
Patient ID: Sandra [name], female   DOB: May 20, 1960, 51 y.o.   MRN: [id number] Subjective:   WU:[id number] right shoulder  WGN:FAOZH comes in for followup of her right shoulder rotator cuff pathology. She had a cortisone injection, subacromial, and has been doing her home exercises. Overall she is 95% better, and has no complaints, and no limitations.  Past medical history, surgical history, family history, social history, allergies, and medications reviewed from the medical record and no changes needed.  Review of Systems: No fevers, chills, night sweats, weight loss, chest pain, or shortness of breath.    Objective:  General:  Well Developed, well nourished, and in no acute distress. Neuro:  Alert and oriented x3, extra-ocular muscles intact. Skin: Warm and dry, no rashes noted. Respiratory:  Not using accessory muscles, speaking in full sentences. Musculoskeletal: Right Shoulder: Inspection reveals no abnormalities, atrophy or asymmetry. Palpation is normal with no tenderness over AC joint or bicipital groove. ROM is full in all planes. Rotator cuff strength normal throughout. No signs of impingement with negative Neer and Hawkin's tests, empty can sign. Speeds and Yergason's tests normal. No labral pathology noted with negative Obrien's, negative clunk and good stability. Normal scapular function observed. No painful arc and no drop arm sign. No apprehension sign    Assessment & Plan:

## 2012-02-04 ENCOUNTER — Encounter: Payer: Self-pay | Admitting: Family Medicine

## 2012-02-04 ENCOUNTER — Ambulatory Visit (INDEPENDENT_AMBULATORY_CARE_PROVIDER_SITE_OTHER): Payer: 59 | Admitting: Family Medicine

## 2012-02-04 VITALS — BP 112/78 | HR 91 | Temp 98.7°F | Ht 66.0 in | Wt 188.8 lb

## 2012-02-04 DIAGNOSIS — IMO0001 Reserved for inherently not codable concepts without codable children: Secondary | ICD-10-CM

## 2012-02-04 DIAGNOSIS — N393 Stress incontinence (female) (male): Secondary | ICD-10-CM

## 2012-02-04 DIAGNOSIS — Z1211 Encounter for screening for malignant neoplasm of colon: Secondary | ICD-10-CM | POA: Insufficient documentation

## 2012-02-04 DIAGNOSIS — I10 Essential (primary) hypertension: Secondary | ICD-10-CM

## 2012-02-04 DIAGNOSIS — R490 Dysphonia: Secondary | ICD-10-CM

## 2012-02-04 DIAGNOSIS — E785 Hyperlipidemia, unspecified: Secondary | ICD-10-CM

## 2012-02-04 DIAGNOSIS — G43909 Migraine, unspecified, not intractable, without status migrainosus: Secondary | ICD-10-CM | POA: Insufficient documentation

## 2012-02-04 DIAGNOSIS — G43109 Migraine with aura, not intractable, without status migrainosus: Secondary | ICD-10-CM

## 2012-02-04 DIAGNOSIS — R0789 Other chest pain: Secondary | ICD-10-CM

## 2012-02-04 DIAGNOSIS — F411 Generalized anxiety disorder: Secondary | ICD-10-CM

## 2012-02-04 DIAGNOSIS — K219 Gastro-esophageal reflux disease without esophagitis: Secondary | ICD-10-CM

## 2012-02-04 DIAGNOSIS — Z0000 Encounter for general adult medical examination without abnormal findings: Secondary | ICD-10-CM | POA: Insufficient documentation

## 2012-02-04 DIAGNOSIS — F419 Anxiety disorder, unspecified: Secondary | ICD-10-CM

## 2012-02-04 DIAGNOSIS — G43809 Other migraine, not intractable, without status migrainosus: Secondary | ICD-10-CM

## 2012-02-04 HISTORY — DX: Dysphonia: R49.0

## 2012-02-04 HISTORY — DX: Encounter for general adult medical examination without abnormal findings: Z00.00

## 2012-02-04 HISTORY — DX: Stress incontinence (female) (male): N39.3

## 2012-02-04 LAB — HEPATIC FUNCTION PANEL
ALT: 48 U/L — ABNORMAL HIGH (ref 0–35)
AST: 51 U/L — ABNORMAL HIGH (ref 0–37)
Albumin: 4 g/dL (ref 3.5–5.2)
Alkaline Phosphatase: 54 U/L (ref 39–117)
Bilirubin, Direct: 0 mg/dL (ref 0.0–0.3)
Total Bilirubin: 0.7 mg/dL (ref 0.3–1.2)
Total Protein: 7.4 g/dL (ref 6.0–8.3)

## 2012-02-04 LAB — CBC
HCT: 41.3 % (ref 36.0–46.0)
Hemoglobin: 13.3 g/dL (ref 12.0–15.0)
MCHC: 32.3 g/dL (ref 30.0–36.0)
MCV: 91.3 fl (ref 78.0–100.0)
Platelets: 289 10*3/uL (ref 150.0–400.0)
RBC: 4.52 Mil/uL (ref 3.87–5.11)
RDW: 13.6 % (ref 11.5–14.6)
WBC: 9 10*3/uL (ref 4.5–10.5)

## 2012-02-04 LAB — LIPID PANEL
Cholesterol: 188 mg/dL (ref 0–200)
HDL: 47.1 mg/dL (ref >39.00)
Total CHOL/HDL Ratio: 4
Triglycerides: 225 mg/dL — ABNORMAL HIGH (ref 0.0–149.0)
VLDL: 45 mg/dL — ABNORMAL HIGH (ref 0.0–40.0)

## 2012-02-04 LAB — RENAL FUNCTION PANEL
Albumin: 4 g/dL (ref 3.5–5.2)
BUN: 16 mg/dL (ref 6–23)
CO2: 26 mEq/L (ref 19–32)
Calcium: 10.9 mg/dL — ABNORMAL HIGH (ref 8.4–10.5)
Chloride: 101 mEq/L (ref 96–112)
Creatinine, Ser: 0.7 mg/dL (ref 0.4–1.2)
GFR: 92.6 mL/min (ref >60.00)
Glucose, Bld: 95 mg/dL (ref 70–99)
Phosphorus: 3.3 mg/dL (ref 2.3–4.6)
Potassium: 4.2 mEq/L (ref 3.5–5.1)
Sodium: 135 mEq/L (ref 135–145)

## 2012-02-04 LAB — LDL CHOLESTEROL, DIRECT: Direct LDL: 120.1 mg/dL

## 2012-02-04 LAB — T4, FREE: Free T4: 0.62 ng/dL (ref 0.60–1.60)

## 2012-02-04 LAB — TSH: TSH: 2.21 u[IU]/mL (ref 0.35–5.50)

## 2012-02-04 MED ORDER — ALPRAZOLAM 0.25 MG PO TABS: 0.2500 mg | ORAL_TABLET | Freq: Two times a day (BID) | ORAL | Status: DC | PRN

## 2012-02-04 MED ORDER — RANITIDINE HCL 300 MG PO TABS: 300.0000 mg | ORAL_TABLET | Freq: Every day | ORAL | Status: DC

## 2012-02-04 NOTE — Progress Notes (Signed)
Patient ID: Sandra [name], female   DOB: 1960/05/02, 51 y.o.   MRN: [id number] CARMELINA BALDUCCI [id number] 05/10/1960 02/04/2012      Progress Note New Patient  Subjective  Chief Complaint  Chief Complaint  Patient presents with  . Establish Care    new patient    HPI  Patient is a 51 year old Caucasian female who is in today for new patient appointment. She is perimenopausal is following with GYN. Reports that her hormones help her ocular migraines but she still having several hot flashes a day. Her bigger concern is 2 episodes of pain. She said the first episode was in her left arm as a squeezing sensation which radiated to her neck and resolved after 3 minutes. She was at work and there were no associated symptoms such as other palpitations shortness of breath diaphoresis or nausea. Her last episode was about a month ago was in both arms radiating to the chest and neck resolved after 3 minutes and had no associated symptoms. She's had a thorough cardiac workup Dr. Excell Seltzer and they have not been any cardiac cause she understood very anxious and is actually concerned she has blood clots. She is offered reassurance that this would not be bilateral. Has an ongoing problem with hoarseness off and on for the last year. 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 Daily heartburn and does not take medications for that. Has not had a menstrual cycle in several months now since starting her hormone. No acute illness, fevers, chills, chest pain, palpitations, GI concerns. She does not have some mild stress urinary incontinence secondary to her bladder falling but so far has not chosen to correct this  Past Medical History  Diagnosis Date  . Hypertension   . Chest pain   . Hyperlipidemia   . Rapid heart beat   . Chicken pox as achild  . Hoarseness 02/04/2012  . Ocular migraine   . GERD (gastroesophageal reflux disease)   . Preventative health care  02/04/2012  . Atypical chest pain 01/03/2011    Past Surgical History  Procedure Date  . Pelvic laparoscopy 03-2004  . Wisdom tooth extraction 30 yrs ago  . Btl   . Tubal ligation   . Breast surgery     left breast excision of adenoma    Family History  Problem Relation Age of Onset  . Hypertension Mother 85    alive  . Cancer Mother     renal  . Hypertension Father 42    alive  . Atrial fibrillation Father   . Cancer Father     colon  . Hypertension Brother 56    alive  . Hypertension Sister   . Heart disease Paternal Aunt   . Heart disease Paternal Uncle   . Cancer Maternal Grandfather     lung/ smoker  . Emphysema Maternal Grandfather   . Heart disease Paternal Grandmother   . Other Paternal Grandfather     black lung  . Heart disease Paternal Grandfather   . Hypertension Sister     History   Social History  . Marital Status: Married    Spouse Name: N/A    Number of Children: 2  . Years of Education: N/A   Occupational History  . RN [employer]   Social History Main Topics  . Smoking status: Former Smoker -- 1.0 packs/day for 20 years    Types: Cigarettes    Quit date:  03/10/2005  . Smokeless tobacco: Never Used  . Alcohol Use: Yes     Comment: very rarely  . Drug Use: No  . Sexually Active: Yes -- Female partner(s)   Other Topics Concern  . Not on file   Social History Narrative  . No narrative on file    Current Outpatient Prescriptions on File Prior to Visit  Medication Sig Dispense Refill  . aspirin 81 MG tablet Take 81 mg by mouth daily.        Marland Kitchen lisinopril-hydrochlorothiazide (PRINZIDE,ZESTORETIC) 10-12.5 MG per tablet Take 1 tablet by mouth daily.        . ranitidine (ZANTAC) 300 MG tablet Take 1 tablet (300 mg total) by mouth at bedtime.  30 tablet  5    Allergies  Allergen Reactions  . Penicillins     Review of Systems  Review of Systems  Constitutional: Negative for fever, malaise/fatigue and diaphoresis.  HENT:  Negative for congestion.   Eyes: Negative for discharge.  Respiratory: Negative for shortness of breath.   Cardiovascular: Positive for chest pain. Negative for palpitations and leg swelling.  Gastrointestinal: Negative for nausea, abdominal pain and diarrhea.  Genitourinary: Negative for dysuria.  Musculoskeletal: Positive for myalgias. Negative for falls.       Pain b/l upper arms radiating to neck twice, resolves after 3 minutes  Skin: Negative for rash.  Neurological: Negative for loss of consciousness, weakness and headaches.  Endo/Heme/Allergies: Negative for polydipsia.  Psychiatric/Behavioral: Negative for depression and suicidal ideas. The patient is not nervous/anxious and does not have insomnia.     Objective  BP 112/78  Pulse 91  Temp 98.7 F (37.1 C) (Temporal)  Ht 5\' 6"  (1.676 m)  Wt 188 lb 12.8 oz (85.639 kg)  BMI 30.47 kg/m2  SpO2 95%  Physical Exam  Physical Exam  Constitutional: She is oriented to person, place, and time and well-developed, well-nourished, and in no distress. No distress.  HENT:  Head: Normocephalic and atraumatic.  Right Ear: External ear normal.  Left Ear: External ear normal.  Nose: Nose normal.  Mouth/Throat: Oropharynx is clear and moist. No oropharyngeal exudate.  Eyes: Conjunctivae normal are normal. Pupils are equal, round, and reactive to light. Right eye exhibits no discharge. Left eye exhibits no discharge. No scleral icterus.  Neck: Normal range of motion. Neck supple. No thyromegaly present.  Cardiovascular: Normal rate, regular rhythm, normal heart sounds and intact distal pulses.   No murmur heard. Pulmonary/Chest: Effort normal and breath sounds normal. No respiratory distress. She has no wheezes. She has no rales.  Abdominal: Soft. Bowel sounds are normal. She exhibits no distension and no mass. There is no tenderness.  Musculoskeletal: Normal range of motion. She exhibits no edema and no tenderness.  Lymphadenopathy:     She has no cervical adenopathy.  Neurological: She is alert and oriented to person, place, and time. She has normal reflexes. No cranial nerve deficit. Coordination normal.  Skin: Skin is warm and dry. No rash noted. She is not diaphoretic.  Psychiatric: Mood, memory and affect normal.       Assessment & Plan  Preventative health care Check fasting labs, obtain old records, encouraged DASH diet, 8 hours of sleep and daily exercise  Ocular migraine Reports her hormones from dr Newt Minion have helped her migraines.  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 Encouraged vocal rest  and Ranitidine if no improvement will need referral for further consideration  GERD (gastroesophageal reflux disease) Symptoms daily likely contributing to her hoarseness and chest discomfort. Start Ranitidine 300 mg qhs and if she gets the atypical chest and neck pain try a dose of Maalox to see if that helps. If no improvement consider upper endoscopy with GI  Atypical chest pain Has had 2 episodes in the last year. Has undergone a cardiac workup which was unremarkable. Both episodes are somewhat atypical. The first one she describes the squeezing was quite painful in 3 minutes long in her left arm. The second episode which occurred a month ago was a squeezing in both upper arms leading into the chest and upper neck. Also lasted about 3 minutes she was at work when it occurred. Given the negative cardiac workup to 2 other concerns are anxiety and adrenaline and or her heartburn causing the symptoms. We'll start her on ranitidine and Maalox. Is given alprazolam to use as needed if another episode occurs she will return sooner as needed otherwise in 6 weeks so we can reevaluate.  Hyperlipidemia Tolerating Simvastatin, no changes, check lipid panel  Hypertension Well controlled, check labs, no  changes to meds

## 2012-02-04 NOTE — Patient Instructions (Addendum)
Start a krill oil cap daily such as MegaRed and consider Icool   Preventive Care for Adults, Female A healthy lifestyle and preventive care can promote health and wellness. Preventive health guidelines for women include the following key practices.  A routine yearly physical is a good way to check with your caregiver about your health and preventive screening. It is a chance to share any concerns and updates on your health, and to receive a thorough exam.  Visit your dentist for a routine exam and preventive care every 6 months. Brush your teeth twice a day and floss once a day. Good oral hygiene prevents tooth decay and gum disease.  The frequency of eye exams is based on your age, health, family medical history, use of contact lenses, and other factors. Follow your caregiver's recommendations for frequency of eye exams.  Eat a healthy diet. Foods like vegetables, fruits, whole grains, low-fat dairy products, and lean protein foods contain the nutrients you need without too many calories. Decrease your intake of foods high in solid fats, added sugars, and salt. Eat the right amount of calories for you.Get information about a proper diet from your caregiver, if necessary.  Regular physical exercise is one of the most important things you can do for your health. Most adults should get at least 150 minutes of moderate-intensity exercise (any activity that increases your heart rate and causes you to sweat) each week. In addition, most adults need muscle-strengthening exercises on 2 or more days a week.  Maintain a healthy weight. The body mass index (BMI) is a screening tool to identify possible weight problems. It provides an estimate of body fat based on height and weight. Your caregiver can help determine your BMI, and can help you achieve or maintain a healthy weight.For adults 20 years and older:  A BMI below 18.5 is considered underweight.  A BMI of 18.5 to 24.9 is normal.  A BMI of 25 to  29.9 is considered overweight.  A BMI of 30 and above is considered obese.  Maintain normal blood lipids and cholesterol levels by exercising and minimizing your intake of saturated fat. Eat a balanced diet with plenty of fruit and vegetables. Blood tests for lipids and cholesterol should begin at age 7 and be repeated every 5 years. If your lipid or cholesterol levels are high, you are over 50, or you are at high risk for heart disease, you may need your cholesterol levels checked more frequently.Ongoing high lipid and cholesterol levels should be treated with medicines if diet and exercise are not effective.  If you smoke, find out from your caregiver how to quit. If you do not use tobacco, do not start.  If you are pregnant, do not drink alcohol. If you are breastfeeding, be very cautious about drinking alcohol. If you are not pregnant and choose to drink alcohol, do not exceed 1 drink per day. One drink is considered to be 12 ounces (355 mL) of beer, 5 ounces (148 mL) of wine, or 1.5 ounces (44 mL) of liquor.  Avoid use of street drugs. Do not share needles with anyone. Ask for help if you need support or instructions about stopping the use of drugs.  High blood pressure causes heart disease and increases the risk of stroke. Your blood pressure should be checked at least every 1 to 2 years. Ongoing high blood pressure should be treated with medicines if weight loss and exercise are not effective.  If you are 55 to 50 years  old, ask your caregiver if you should take aspirin to prevent strokes.  Diabetes screening involves taking a blood sample to check your fasting blood sugar level. This should be done once every 3 years, after age 72, if you are within normal weight and without risk factors for diabetes. Testing should be considered at a younger age or be carried out more frequently if you are overweight and have at least 1 risk factor for diabetes.  Breast cancer screening is essential  preventive care for women. You should practice "breast self-awareness." This means understanding the normal appearance and feel of your breasts and may include breast self-examination. Any changes detected, no matter how small, should be reported to a caregiver. Women in their 2s and 30s should have a clinical breast exam (CBE) by a caregiver as part of a regular health exam every 1 to 3 years. After age 80, women should have a CBE every year. Starting at age 60, women should consider having a mammography (breast X-ray test) every year. Women who have a family history of breast cancer should talk to their caregiver about genetic screening. Women at a high risk of breast cancer should talk to their caregivers about having magnetic resonance imaging (MRI) and a mammography every year.  The Pap test is a screening test for cervical cancer. A Pap test can show cell changes on the cervix that might become cervical cancer if left untreated. A Pap test is a procedure in which cells are obtained and examined from the lower end of the uterus (cervix).  Women should have a Pap test starting at age 36.  Between ages 46 and 20, Pap tests should be repeated every 2 years.  Beginning at age 23, you should have a Pap test every 3 years as long as the past 3 Pap tests have been normal.  Some women have medical problems that increase the chance of getting cervical cancer. Talk to your caregiver about these problems. It is especially important to talk to your caregiver if a new problem develops soon after your last Pap test. In these cases, your caregiver may recommend more frequent screening and Pap tests.  The above recommendations are the same for women who have or have not gotten the vaccine for human papillomavirus (HPV).  If you had a hysterectomy for a problem that was not cancer or a condition that could lead to cancer, then you no longer need Pap tests. Even if you no longer need a Pap test, a regular exam is  a good idea to make sure no other problems are starting.  If you are between ages 79 and 42, and you have had normal Pap tests going back 10 years, you no longer need Pap tests. Even if you no longer need a Pap test, a regular exam is a good idea to make sure no other problems are starting.  If you have had past treatment for cervical cancer or a condition that could lead to cancer, you need Pap tests and screening for cancer for at least 20 years after your treatment.  If Pap tests have been discontinued, risk factors (such as a new sexual partner) need to be reassessed to determine if screening should be resumed.  The HPV test is an additional test that may be used for cervical cancer screening. The HPV test looks for the virus that can cause the cell changes on the cervix. The cells collected during the Pap test can be tested for HPV. The HPV  test could be used to screen women aged 43 years and older, and should be used in women of any age who have unclear Pap test results. After the age of 15, women should have HPV testing at the same frequency as a Pap test.  Colorectal cancer can be detected and often prevented. Most routine colorectal cancer screening begins at the age of 46 and continues through age 55. However, your caregiver may recommend screening at an earlier age if you have risk factors for colon cancer. On a yearly basis, your caregiver may provide home test kits to check for hidden blood in the stool. Use of a small camera at the end of a tube, to directly examine the colon (sigmoidoscopy or colonoscopy), can detect the earliest forms of colorectal cancer. Talk to your caregiver about this at age 62, when routine screening begins. Direct examination of the colon should be repeated every 5 to 10 years through age 85, unless early forms of pre-cancerous polyps or small growths are found.  Hepatitis C blood testing is recommended for all people born from [birth year] through 1965 and any individual  with known risks for hepatitis C.  Practice safe sex. Use condoms and avoid high-risk sexual practices to reduce the spread of sexually transmitted infections (STIs). STIs include gonorrhea, chlamydia, syphilis, trichomonas, herpes, HPV, and human immunodeficiency virus (HIV). Herpes, HIV, and HPV are viral illnesses that have no cure. They can result in disability, cancer, and death. Sexually active women aged 39 and younger should be checked for chlamydia. Older women with new or multiple partners should also be tested for chlamydia. Testing for other STIs is recommended if you are sexually active and at increased risk.  Osteoporosis is a disease in which the bones lose minerals and strength with aging. This can result in serious bone fractures. The risk of osteoporosis can be identified using a bone density scan. Women ages 52 and over and women at risk for fractures or osteoporosis should discuss screening with their caregivers. Ask your caregiver whether you should take a calcium supplement or vitamin D to reduce the rate of osteoporosis.  Menopause can be associated with physical symptoms and risks. Hormone replacement therapy is available to decrease symptoms and risks. You should talk to your caregiver about whether hormone replacement therapy is right for you.  Use sunscreen with sun protection factor (SPF) of 30 or more. Apply sunscreen liberally and repeatedly throughout the day. You should seek shade when your shadow is shorter than you. Protect yourself by wearing long sleeves, pants, a wide-brimmed hat, and sunglasses year round, whenever you are outdoors.  Once a month, do a whole body skin exam, using a mirror to look at the skin on your back. Notify your caregiver of new moles, moles that have irregular borders, moles that are larger than a pencil eraser, or moles that have changed in shape or color.  Stay current with required immunizations.  Influenza. You need a dose every fall (or  winter). The composition of the flu vaccine changes each year, so being vaccinated once is not enough.  Pneumococcal polysaccharide. You need 1 to 2 doses if you smoke cigarettes or if you have certain chronic medical conditions. You need 1 dose at age 70 (or older) if you have never been vaccinated.  Tetanus, diphtheria, pertussis (Tdap, Td). Get 1 dose of Tdap vaccine if you are younger than age 4, are over 46 and have contact with an infant, are a Research scientist (physical sciences), are pregnant, or  simply want to be protected from whooping cough. After that, you need a Td booster dose every 10 years. Consult your caregiver if you have not had at least 3 tetanus and diphtheria-containing shots sometime in your life or have a deep or dirty wound.  HPV. You need this vaccine if you are a woman age 32 or younger. The vaccine is given in 3 doses over 6 months.  Measles, mumps, rubella (MMR). You need at least 1 dose of MMR if you were born in 1957 or later. You may also need a second dose.  Meningococcal. If you are age 49 to 64 and a first-year college student living in a residence hall, or have one of several medical conditions, you need to get vaccinated against meningococcal disease. You may also need additional booster doses.  Zoster (shingles). If you are age 67 or older, you should get this vaccine.  Varicella (chickenpox). If you have never had chickenpox or you were vaccinated but received only 1 dose, talk to your caregiver to find out if you need this vaccine.  Hepatitis A. You need this vaccine if you have a specific risk factor for hepatitis A virus infection or you simply wish to be protected from this disease. The vaccine is usually given as 2 doses, 6 to 18 months apart.  Hepatitis B. You need this vaccine if you have a specific risk factor for hepatitis B virus infection or you simply wish to be protected from this disease. The vaccine is given in 3 doses, usually over 6 months. Preventive  Services / Frequency Ages 48 to 64  Blood pressure check.** / Every 1 to 2 years.  Lipid and cholesterol check.** / Every 5 years beginning at age 92.  Clinical breast exam.** / Every 3 years for women in their 14s and 30s.  Pap test.** / Every 2 years from ages 18 through 60. Every 3 years starting at age 34 through age 81 or 86 with a history of 3 consecutive normal Pap tests.  HPV screening.** / Every 3 years from ages 42 through ages 1 to 59 with a history of 3 consecutive normal Pap tests.  Hepatitis C blood test.** / For any individual with known risks for hepatitis C.  Skin self-exam. / Monthly.  Influenza immunization.** / Every year.  Pneumococcal polysaccharide immunization.** / 1 to 2 doses if you smoke cigarettes or if you have certain chronic medical conditions.  Tetanus, diphtheria, pertussis (Tdap, Td) immunization. / A one-time dose of Tdap vaccine. After that, you need a Td booster dose every 10 years.  HPV immunization. / 3 doses over 6 months, if you are 45 and younger.  Measles, mumps, rubella (MMR) immunization. / You need at least 1 dose of MMR if you were born in 1957 or later. You may also need a second dose.  Meningococcal immunization. / 1 dose if you are age 17 to 64 and a first-year college student living in a residence hall, or have one of several medical conditions, you need to get vaccinated against meningococcal disease. You may also need additional booster doses.  Varicella immunization.** / Consult your caregiver.  Hepatitis A immunization.** / Consult your caregiver. 2 doses, 6 to 18 months apart.  Hepatitis B immunization.** / Consult your caregiver. 3 doses usually over 6 months. Ages 44 to 60  Blood pressure check.** / Every 1 to 2 years.  Lipid and cholesterol check.** / Every 5 years beginning at age 76.  Clinical breast exam.** /  Every year after age 8.  Mammogram.** / Every year beginning at age 10 and continuing for as long as you  are in good health. Consult with your caregiver.  Pap test.** / Every 3 years starting at age 86 through age 12 or 46 with a history of 3 consecutive normal Pap tests.  HPV screening.** / Every 3 years from ages 42 through ages 65 to 47 with a history of 3 consecutive normal Pap tests.  Fecal occult blood test (FOBT) of stool. / Every year beginning at age 41 and continuing until age 2. You may not need to do this test if you get a colonoscopy every 10 years.  Flexible sigmoidoscopy or colonoscopy.** / Every 5 years for a flexible sigmoidoscopy or every 10 years for a colonoscopy beginning at age 81 and continuing until age 48.  Hepatitis C blood test.** / For all people born from [birth year] through 1964 and any individual with known risks for hepatitis C.  Skin self-exam. / Monthly.  Influenza immunization.** / Every year.  Pneumococcal polysaccharide immunization.** / 1 to 2 doses if you smoke cigarettes or if you have certain chronic medical conditions.  Tetanus, diphtheria, pertussis (Tdap, Td) immunization.** / A one-time dose of Tdap vaccine. After that, you need a Td booster dose every 10 years.  Measles, mumps, rubella (MMR) immunization. / You need at least 1 dose of MMR if you were born in 1957 or later. You may also need a second dose.  Varicella immunization.** / Consult your caregiver.  Meningococcal immunization.** / Consult your caregiver.  Hepatitis A immunization.** / Consult your caregiver. 2 doses, 6 to 18 months apart.  Hepatitis B immunization.** / Consult your caregiver. 3 doses, usually over 6 months. Ages 71 and over  Blood pressure check.** / Every 1 to 2 years.  Lipid and cholesterol check.** / Every 5 years beginning at age 81.  Clinical breast exam.** / Every year after age 53.  Mammogram.** / Every year beginning at age 79 and continuing for as long as you are in good health. Consult with your caregiver.  Pap test.** / Every 3 years starting at age 82  through age 47 or 70 with a 3 consecutive normal Pap tests. Testing can be stopped between 65 and 70 with 3 consecutive normal Pap tests and no abnormal Pap or HPV tests in the past 10 years.  HPV screening.** / Every 3 years from ages 28 through ages 45 or 55 with a history of 3 consecutive normal Pap tests. Testing can be stopped between 65 and 70 with 3 consecutive normal Pap tests and no abnormal Pap or HPV tests in the past 10 years.  Fecal occult blood test (FOBT) of stool. / Every year beginning at age 18 and continuing until age 62. You may not need to do this test if you get a colonoscopy every 10 years.  Flexible sigmoidoscopy or colonoscopy.** / Every 5 years for a flexible sigmoidoscopy or every 10 years for a colonoscopy beginning at age 66 and continuing until age 73.  Hepatitis C blood test.** / For all people born from [birth year] through 1964 and any individual with known risks for hepatitis C.  Osteoporosis screening.** / A one-time screening for women ages 28 and over and women at risk for fractures or osteoporosis.  Skin self-exam. / Monthly.  Influenza immunization.** / Every year.  Pneumococcal polysaccharide immunization.** / 1 dose at age 59 (or older) if you have never been vaccinated.  Tetanus, diphtheria, pertussis (  Tdap, Td) immunization. / A one-time dose of Tdap vaccine if you are over 65 and have contact with an infant, are a Research scientist (physical sciences), or simply want to be protected from whooping cough. After that, you need a Td booster dose every 10 years.  Varicella immunization.** / Consult your caregiver.  Meningococcal immunization.** / Consult your caregiver.  Hepatitis A immunization.** / Consult your caregiver. 2 doses, 6 to 18 months apart.  Hepatitis B immunization.** / Check with your caregiver. 3 doses, usually over 6 months. ** Family history and personal history of risk and conditions may change your caregiver's recommendations. Document Released: 04/22/2001  Document Revised: 05/19/2011 Document Reviewed: 07/22/2010 [facility] Patient Information 2013 [place], [place].

## 2012-02-04 NOTE — Assessment & Plan Note (Signed)
Has had 2 episodes in the last year. Has undergone a cardiac workup which was unremarkable. Both episodes are somewhat atypical. The first one she describes the squeezing was quite painful in 3 minutes long in her left arm. The second episode which occurred a month ago was a squeezing in both upper arms leading into the chest and upper neck. Also lasted about 3 minutes she was at work when it occurred. Given the negative cardiac workup to 2 other concerns are anxiety and adrenaline and or her heartburn causing the symptoms. We'll start her on ranitidine and Maalox. Is given alprazolam to use as needed if another episode occurs she will return sooner as needed otherwise in 6 weeks so we can reevaluate.

## 2012-02-04 NOTE — Assessment & Plan Note (Signed)
Tolerating Simvastatin, no changes, check lipid panel

## 2012-02-04 NOTE — Assessment & Plan Note (Signed)
Symptoms daily likely contributing to her hoarseness and chest discomfort. Start Ranitidine 300 mg qhs and if she gets the atypical chest and neck pain try a dose of Maalox to see if that helps. If no improvement consider upper endoscopy with GI

## 2012-02-04 NOTE — Assessment & Plan Note (Signed)
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 Encouraged vocal rest and Ranitidine if no improvement will need referral for further consideration

## 2012-02-04 NOTE — Assessment & Plan Note (Signed)
Check fasting labs, obtain old records, encouraged DASH diet, 8 hours of sleep and daily exercise

## 2012-02-04 NOTE — Assessment & Plan Note (Signed)
Reports her hormones from dr Newt Minion have helped her migraines.

## 2012-02-04 NOTE — Assessment & Plan Note (Signed)
Well controlled, check labs, no changes to meds

## 2012-03-07 ENCOUNTER — Other Ambulatory Visit: Payer: Self-pay | Admitting: Family Medicine

## 2012-03-16 ENCOUNTER — Ambulatory Visit: Payer: 59 | Admitting: Family Medicine

## 2012-03-17 ENCOUNTER — Ambulatory Visit: Payer: 59 | Admitting: Family Medicine

## 2012-04-18 ENCOUNTER — Other Ambulatory Visit: Payer: Self-pay | Admitting: Family Medicine

## 2012-05-07 ENCOUNTER — Ambulatory Visit: Payer: 59 | Admitting: Family Medicine

## 2012-05-18 ENCOUNTER — Other Ambulatory Visit: Payer: Self-pay

## 2012-05-18 DIAGNOSIS — Z1231 Encounter for screening mammogram for malignant neoplasm of breast: Secondary | ICD-10-CM

## 2012-05-31 ENCOUNTER — Ambulatory Visit (INDEPENDENT_AMBULATORY_CARE_PROVIDER_SITE_OTHER): Payer: 59 | Admitting: Family Medicine

## 2012-05-31 ENCOUNTER — Encounter: Payer: Self-pay | Admitting: Family Medicine

## 2012-05-31 VITALS — BP 132/80 | HR 81 | Temp 98.0°F | Ht 66.0 in | Wt 196.1 lb

## 2012-05-31 DIAGNOSIS — I10 Essential (primary) hypertension: Secondary | ICD-10-CM

## 2012-05-31 DIAGNOSIS — R030 Elevated blood-pressure reading, without diagnosis of hypertension: Secondary | ICD-10-CM

## 2012-05-31 DIAGNOSIS — N3281 Overactive bladder: Secondary | ICD-10-CM

## 2012-05-31 DIAGNOSIS — IMO0001 Reserved for inherently not codable concepts without codable children: Secondary | ICD-10-CM

## 2012-05-31 DIAGNOSIS — E785 Hyperlipidemia, unspecified: Secondary | ICD-10-CM

## 2012-05-31 DIAGNOSIS — N318 Other neuromuscular dysfunction of bladder: Secondary | ICD-10-CM

## 2012-05-31 DIAGNOSIS — F411 Generalized anxiety disorder: Secondary | ICD-10-CM

## 2012-05-31 DIAGNOSIS — N393 Stress incontinence (female) (male): Secondary | ICD-10-CM

## 2012-05-31 DIAGNOSIS — K219 Gastro-esophageal reflux disease without esophagitis: Secondary | ICD-10-CM

## 2012-05-31 DIAGNOSIS — R7989 Other specified abnormal findings of blood chemistry: Secondary | ICD-10-CM

## 2012-05-31 DIAGNOSIS — J309 Allergic rhinitis, unspecified: Secondary | ICD-10-CM

## 2012-05-31 DIAGNOSIS — E669 Obesity, unspecified: Secondary | ICD-10-CM | POA: Insufficient documentation

## 2012-05-31 HISTORY — DX: Other specified abnormal findings of blood chemistry: R79.89

## 2012-05-31 HISTORY — DX: Obesity, unspecified: E66.9

## 2012-05-31 HISTORY — DX: Overactive bladder: N32.81

## 2012-05-31 HISTORY — DX: Allergic rhinitis, unspecified: J30.9

## 2012-05-31 LAB — LIPID PANEL
Cholesterol: 189 mg/dL (ref 0–200)
HDL: 45 mg/dL (ref >39)
LDL Cholesterol: 111 mg/dL — ABNORMAL HIGH (ref 0–99)
Total CHOL/HDL Ratio: 4.2 Ratio
Triglycerides: 164 mg/dL — ABNORMAL HIGH (ref <150)
VLDL: 33 mg/dL (ref 0–40)

## 2012-05-31 LAB — HEPATIC FUNCTION PANEL
ALT: 21 U/L (ref 0–35)
AST: 19 U/L (ref 0–37)
Albumin: 4.3 g/dL (ref 3.5–5.2)
Alkaline Phosphatase: 60 U/L (ref 39–117)
Bilirubin, Direct: 0.1 mg/dL (ref 0.0–0.3)
Indirect Bilirubin: 0.2 mg/dL (ref 0.0–0.9)
Total Bilirubin: 0.3 mg/dL (ref 0.3–1.2)
Total Protein: 7.1 g/dL (ref 6.0–8.3)

## 2012-05-31 LAB — TSH: TSH: 2.251 u[IU]/mL (ref 0.350–4.500)

## 2012-05-31 MED ORDER — FLUTICASONE PROPIONATE 50 MCG/ACT NA SUSP: 2.0000 | Freq: Every day | NASAL | Status: DC | PRN

## 2012-05-31 MED ORDER — ALPRAZOLAM 0.25 MG PO TABS: 0.2500 mg | ORAL_TABLET | Freq: Every evening | ORAL | Status: DC | PRN

## 2012-05-31 NOTE — Patient Instructions (Addendum)
Check BP once monthly and increase LIsinopril hct to twice daily if above 135/90 and call us for bp check 3-4 weeks after that  Cholesterol Cholesterol is a white, waxy, fat-like protein needed by your body in small amounts. The liver makes all the cholesterol you need. It is carried from the liver by the blood through the blood vessels. Deposits (plaque) may build up on blood vessel walls. This makes the arteries narrower and stiffer. Plaque increases the risk for heart attack and stroke. You cannot feel your cholesterol level even if it is very high. The only way to know is by a blood test to check your lipid (fats) levels. Once you know your cholesterol levels, you should keep a record of the test results. Work with your caregiver to to keep your levels in the desired range. WHAT THE RESULTS MEAN:  Total cholesterol is a rough measure of all the cholesterol in your blood.  LDL is the so-called bad cholesterol. This is the type that deposits cholesterol in the walls of the arteries. You want this level to be low.  HDL is the good cholesterol because it cleans the arteries and carries the LDL away. You want this level to be high.  Triglycerides are fat that the body can either burn for energy or store. High levels are closely linked to heart disease. DESIRED LEVELS:  Total cholesterol below 200.  LDL below 100 for people at risk, below 70 for very high risk.  HDL above 50 is good, above 60 is best.  Triglycerides below 150. HOW TO LOWER YOUR CHOLESTEROL:  Diet.  Choose fish or white meat chicken and [place], roasted or baked. Limit fatty cuts of red meat, fried foods, and processed meats, such as sausage and lunch meat.  Eat lots of fresh fruits and vegetables. Choose whole grains, beans, pasta, potatoes and cereals.  Use only small amounts of olive, corn or canola oils. Avoid butter, mayonnaise, shortening or palm kernel oils. Avoid foods with trans-fats.  Use skim/nonfat milk and  low-fat/nonfat yogurt and cheeses. Avoid whole milk, cream, ice cream, egg yolks and cheeses. Healthy desserts include angel food cake, ginger snaps, animal crackers, hard candy, popsicles, and low-fat/nonfat frozen yogurt. Avoid pastries, cakes, pies and cookies.  Exercise.  A regular program helps decrease LDL and raises HDL.  Helps with weight control.  Do things that increase your activity level like gardening, walking, or taking the stairs.  Medication.  May be prescribed by your caregiver to help lowering cholesterol and the risk for heart disease.  You may need medicine even if your levels are normal if you have several risk factors. HOME CARE INSTRUCTIONS   Follow your diet and exercise programs as suggested by your caregiver.  Take medications as directed.  Have blood work done when your caregiver feels it is necessary. MAKE SURE YOU:   Understand these instructions.  Will watch your condition.  Will get help right away if you are not doing well or get worse. Document Released: 11/19/2000 Document Revised: 05/19/2011 Document Reviewed: 05/12/2007 [facility] Patient Information 2013 [place], [place].

## 2012-05-31 NOTE — Assessment & Plan Note (Signed)
Manageable symptoms, avoid offending foods, encouraged DASH diet.

## 2012-05-31 NOTE — Assessment & Plan Note (Signed)
Zyrtec, given rx for Flonase to try, cont Pataday prn

## 2012-05-31 NOTE — Assessment & Plan Note (Signed)
Encouraged DASH diet and increase exercise. 

## 2012-05-31 NOTE — Assessment & Plan Note (Signed)
Was started on Toviaz 4 mg daily by her Gynecologist. Is happy with the results

## 2012-05-31 NOTE — Assessment & Plan Note (Signed)
Mild, started MegaRed krill oil caps encouraged to avoid trans fats and minimize saturated fats, simple carbs. Recheck panel today, increase exercise

## 2012-05-31 NOTE — Assessment & Plan Note (Signed)
Likely fatty liver, if still elevated today will add a acute hepatitis panel, minimize simple carbs

## 2012-05-31 NOTE — Progress Notes (Signed)
Patient ID: Sandra [name], female   DOB: 1961/01/12, 52 y.o.   MRN: [id number] Sandra [name] [id number] 1960-07-05 05/31/2012      Progress Note-Follow Up  Subjective  Chief Complaint  Chief Complaint  Patient presents with  . Follow-up    6 month    HPI  Patient is a 52 year old Caucasian female who is in today for followup. She has been seen by her gynecologist and started until we asked for her overactive bladder that has been helpful. Her hot flashes are somewhat improved with her hormones and Icool. She continues to have trouble sleeping. Week several times a night and has trouble falling asleep. Is pleased with her response to the alprazolam. 8 tabs lasted for 4-5 months. She did find it helpful when she needed it. Has had very few ocular migraines. No new complaints. Does note shortness of breath with exertion but finds that to be in the acceptable level consistent with her debility from not exercising. No chest pain, palpitations, shortness of breath, increased headaches or other complaints noted. Allergies have flared up and she has used an occasional Zyrtec with partial results. Does have itchy watery eyes at times as well  Past Medical History  Diagnosis Date  . Hypertension   . Chest pain   . Hyperlipidemia   . Rapid heart beat   . Chicken pox as achild  . Hoarseness 02/04/2012  . Ocular migraine   . GERD (gastroesophageal reflux disease)   . Preventative health care 02/04/2012  . Atypical chest pain 01/03/2011  . SUI (stress urinary incontinence, female) 02/04/2012    Sees Dr Newt Minion and they have discussed a bladder tack but so far she declines  . Overactive bladder 05/31/2012  . Elevated LFTs 05/31/2012  . Obesity, unspecified 05/31/2012    Past Surgical History  Procedure Laterality Date  . Pelvic laparoscopy  03-2004  . Wisdom tooth extraction  30 yrs ago  . Btl    . Tubal ligation    . Breast surgery      left breast excision of adenoma    Family  History  Problem Relation Age of Onset  . Hypertension Mother 78    alive  . Cancer Mother     renal  . Hypertension Father 52    alive  . Atrial fibrillation Father   . Cancer Father     colon  . Hypertension Brother 56    alive  . Hypertension Sister   . Heart disease Paternal Aunt   . Heart disease Paternal Uncle   . Cancer Maternal Grandfather     lung/ smoker  . Emphysema Maternal Grandfather   . Heart disease Paternal Grandmother   . Other Paternal Grandfather     black lung  . Heart disease Paternal Grandfather   . Hypertension Sister     History   Social History  . Marital Status: Married    Spouse Name: N/A    Number of Children: 2  . Years of Education: N/A   Occupational History  . RN [employer]   Social History Main Topics  . Smoking status: Former Smoker -- 1.00 packs/day for 20 years    Types: Cigarettes    Quit date: 03/10/2005  . Smokeless tobacco: Never Used  . Alcohol Use: Yes     Comment: very rarely  . Drug Use: No  . Sexually Active: Yes -- Female partner(s)   Other Topics Concern  . Not on file   Social History  Narrative  . No narrative on file    Current Outpatient Prescriptions on File Prior to Visit  Medication Sig Dispense Refill  . aspirin 81 MG tablet Take 81 mg by mouth daily.        . Cholecalciferol (VITAMIN D PO) Take 1,000 mg by mouth daily. Vitamin D      . estradiol-norethindrone (ACTIVELLA) 1-0.5 MG per tablet Take 1 tablet by mouth daily.      Marland Kitchen lisinopril-hydrochlorothiazide (PRINZIDE,ZESTORETIC) 10-12.5 MG per tablet TAKE 1 TABLET BY MOUTH DAILY.  30 tablet  3  . ranitidine (ZANTAC) 300 MG tablet Take 1 tablet (300 mg total) by mouth at bedtime.  30 tablet  5  . simvastatin (ZOCOR) 40 MG tablet TAKE 1 TABLET BY MOUTH EVERY DAY  30 tablet  5   No current facility-administered medications on file prior to visit.    Allergies  Allergen Reactions  . Penicillins     Review of Systems  Review of Systems   Constitutional: Positive for malaise/fatigue. Negative for fever.  HENT: Negative for congestion.   Eyes: Negative for discharge.  Respiratory: Negative for shortness of breath.   Cardiovascular: Negative for chest pain, palpitations and leg swelling.  Gastrointestinal: Negative for nausea, abdominal pain and diarrhea.  Genitourinary: Negative for dysuria.  Musculoskeletal: Negative for falls.  Skin: Negative for rash.  Neurological: Negative for loss of consciousness and headaches.  Endo/Heme/Allergies: Negative for polydipsia.  Psychiatric/Behavioral: Negative for depression and suicidal ideas. The patient is not nervous/anxious and does not have insomnia.     Objective  BP 132/80  Pulse 81  Temp(Src) 98 F (36.7 C) (Oral)  Ht 5\' 6"  (1.676 m)  Wt 196 lb 1.9 oz (88.959 kg)  BMI 31.67 kg/m2  SpO2 96%  Physical Exam  Physical Exam  Constitutional: She is oriented to person, place, and time and well-developed, well-nourished, and in no distress. No distress.  HENT:  Head: Normocephalic and atraumatic.  Eyes: Conjunctivae are normal.  Neck: Neck supple. No thyromegaly present.  Cardiovascular: Normal rate, regular rhythm and normal heart sounds.   No murmur heard. Pulmonary/Chest: Effort normal and breath sounds normal. She has no wheezes.  Abdominal: She exhibits no distension and no mass.  Musculoskeletal: She exhibits no edema.  Lymphadenopathy:    She has no cervical adenopathy.  Neurological: She is alert and oriented to person, place, and time.  Skin: Skin is warm and dry. No rash noted. She is not diaphoretic.  Psychiatric: Memory, affect and judgment normal.    Lab Results  Component Value Date   TSH 2.21 02/04/2012   Lab Results  Component Value Date   WBC 9.0 02/04/2012   HGB 13.3 02/04/2012   HCT 41.3 02/04/2012   MCV 91.3 02/04/2012   PLT 289.0 02/04/2012   Lab Results  Component Value Date   CREATININE 0.7 02/04/2012   BUN 16 02/04/2012   NA  135 02/04/2012   K 4.2 02/04/2012   CL 101 02/04/2012   CO2 26 02/04/2012   Lab Results  Component Value Date   ALT 48* 02/04/2012   AST 51* 02/04/2012   ALKPHOS 54 02/04/2012   BILITOT 0.7 02/04/2012   Lab Results  Component Value Date   CHOL 188 02/04/2012   Lab Results  Component Value Date   HDL 47.10 02/04/2012   No results found for this basename: [facility]   Lab Results  Component Value Date   TRIG 225.0* 02/04/2012   Lab Results  Component Value Date  CHOLHDL 4 02/04/2012     Assessment & Plan  GERD (gastroesophageal reflux disease) Manageable symptoms, avoid offending foods, encouraged DASH diet.  Hyperlipidemia Mild, started MegaRed krill oil caps encouraged to avoid trans fats and minimize saturated fats, simple carbs. Recheck panel today, increase exercise  SUI (stress urinary incontinence, female) Was started on Toviaz 4 mg daily by her Gynecologist. Is happy with the results  Hypertension Encouraged DASH diet and check bp every month if hi then increase Lisinoprilhct bid   Elevated LFTs Likely fatty liver, if still elevated today will add a acute hepatitis panel, minimize simple carbs  Obesity, unspecified Encouraged DASH diet and increase exercise  Allergic rhinitis Zyrtec, given rx for Flonase to try, cont Pataday prn

## 2012-05-31 NOTE — Assessment & Plan Note (Signed)
Encouraged DASH diet and check bp every month if hi then increase Lisinoprilhct bid

## 2012-06-01 LAB — VITAMIN D 25 HYDROXY (VIT D DEFICIENCY, FRACTURES): Vit D, 25-Hydroxy: 26 ng/mL — ABNORMAL LOW (ref 30–89)

## 2012-06-03 NOTE — Progress Notes (Signed)
Quick Note:  Patient Informed and voiced understanding ______ 

## 2012-06-21 ENCOUNTER — Ambulatory Visit
Admission: RE | Admit: 2012-06-21 | Discharge: 2012-06-21 | Disposition: A | Discharge status: Home / Self Care | Payer: 59 | Source: Ambulatory Visit

## 2012-06-21 DIAGNOSIS — Z1231 Encounter for screening mammogram for malignant neoplasm of breast: Secondary | ICD-10-CM

## 2012-06-22 ENCOUNTER — Other Ambulatory Visit: Payer: Self-pay | Admitting: Obstetrics and Gynecology

## 2012-06-22 DIAGNOSIS — R928 Other abnormal and inconclusive findings on diagnostic imaging of breast: Secondary | ICD-10-CM

## 2012-07-12 ENCOUNTER — Ambulatory Visit
Admission: RE | Admit: 2012-07-12 | Discharge: 2012-07-12 | Disposition: A | Discharge status: Home / Self Care | Payer: 59 | Source: Ambulatory Visit | Attending: Obstetrics and Gynecology | Admitting: Obstetrics and Gynecology

## 2012-07-12 DIAGNOSIS — R928 Other abnormal and inconclusive findings on diagnostic imaging of breast: Secondary | ICD-10-CM

## 2012-07-24 ENCOUNTER — Other Ambulatory Visit: Payer: Self-pay | Admitting: Family Medicine

## 2012-07-26 NOTE — Telephone Encounter (Signed)
Rx sent in to pharmacy. 

## 2012-09-13 ENCOUNTER — Telehealth: Payer: Self-pay | Admitting: Family Medicine

## 2012-09-13 DIAGNOSIS — IMO0001 Reserved for inherently not codable concepts without codable children: Secondary | ICD-10-CM

## 2012-09-13 DIAGNOSIS — R490 Dysphonia: Secondary | ICD-10-CM

## 2012-09-13 MED ORDER — RANITIDINE HCL 300 MG PO TABS: 300.0000 mg | ORAL_TABLET | Freq: Every day | ORAL | Status: DC

## 2012-09-13 NOTE — Telephone Encounter (Signed)
Rx request to pharmacy/[facility]  

## 2012-09-13 NOTE — Telephone Encounter (Signed)
Refill- ranitidine 300mg  tablet. Take one tablet by mouth at bedtime. Qty 30 last fill 5.16.14

## 2012-11-29 ENCOUNTER — Ambulatory Visit (INDEPENDENT_AMBULATORY_CARE_PROVIDER_SITE_OTHER): Payer: 59 | Admitting: Family Medicine

## 2012-11-29 ENCOUNTER — Encounter: Payer: Self-pay | Admitting: Family Medicine

## 2012-11-29 ENCOUNTER — Other Ambulatory Visit: Payer: Self-pay | Admitting: Family Medicine

## 2012-11-29 ENCOUNTER — Ambulatory Visit (HOSPITAL_BASED_OUTPATIENT_CLINIC_OR_DEPARTMENT_OTHER)
Admission: RE | Admit: 2012-11-29 | Discharge: 2012-11-29 | Disposition: A | Discharge status: Home / Self Care | Payer: 59 | Source: Ambulatory Visit | Attending: Family Medicine | Admitting: Family Medicine

## 2012-11-29 VITALS — BP 110/84 | HR 75 | Temp 98.3°F | Ht 66.0 in | Wt 189.0 lb

## 2012-11-29 DIAGNOSIS — R0602 Shortness of breath: Secondary | ICD-10-CM

## 2012-11-29 DIAGNOSIS — Z87891 Personal history of nicotine dependence: Secondary | ICD-10-CM

## 2012-11-29 DIAGNOSIS — I10 Essential (primary) hypertension: Secondary | ICD-10-CM

## 2012-11-29 DIAGNOSIS — R05 Cough: Secondary | ICD-10-CM | POA: Insufficient documentation

## 2012-11-29 DIAGNOSIS — R059 Cough, unspecified: Secondary | ICD-10-CM | POA: Insufficient documentation

## 2012-11-29 DIAGNOSIS — R7989 Other specified abnormal findings of blood chemistry: Secondary | ICD-10-CM

## 2012-11-29 DIAGNOSIS — E785 Hyperlipidemia, unspecified: Secondary | ICD-10-CM

## 2012-11-29 DIAGNOSIS — K219 Gastro-esophageal reflux disease without esophagitis: Secondary | ICD-10-CM

## 2012-11-29 DIAGNOSIS — R1013 Epigastric pain: Secondary | ICD-10-CM

## 2012-11-29 DIAGNOSIS — Z23 Encounter for immunization: Secondary | ICD-10-CM

## 2012-11-29 DIAGNOSIS — R490 Dysphonia: Secondary | ICD-10-CM

## 2012-11-29 HISTORY — DX: Personal history of nicotine dependence: Z87.891

## 2012-11-29 HISTORY — DX: Cough, unspecified: R05.9

## 2012-11-29 LAB — RENAL FUNCTION PANEL
Albumin: 4.5 g/dL (ref 3.5–5.2)
BUN: 16 mg/dL (ref 6–23)
CO2: 29 mEq/L (ref 19–32)
Calcium: 11.4 mg/dL — ABNORMAL HIGH (ref 8.4–10.5)
Chloride: 101 mEq/L (ref 96–112)
Creat: 0.65 mg/dL (ref 0.50–1.10)
Glucose, Bld: 105 mg/dL — ABNORMAL HIGH (ref 70–99)
Phosphorus: 2.9 mg/dL (ref 2.3–4.6)
Potassium: 4.2 mEq/L (ref 3.5–5.3)
Sodium: 135 mEq/L (ref 135–145)

## 2012-11-29 LAB — LIPID PANEL
Cholesterol: 183 mg/dL (ref 0–200)
HDL: 44 mg/dL (ref >39)
LDL Cholesterol: 106 mg/dL — ABNORMAL HIGH (ref 0–99)
Total CHOL/HDL Ratio: 4.2 Ratio
Triglycerides: 164 mg/dL — ABNORMAL HIGH (ref <150)
VLDL: 33 mg/dL (ref 0–40)

## 2012-11-29 LAB — HEPATIC FUNCTION PANEL
ALT: 17 U/L (ref 0–35)
AST: 18 U/L (ref 0–37)
Albumin: 4.5 g/dL (ref 3.5–5.2)
Alkaline Phosphatase: 60 U/L (ref 39–117)
Bilirubin, Direct: 0.1 mg/dL (ref 0.0–0.3)
Indirect Bilirubin: 0.2 mg/dL (ref 0.0–0.9)
Total Bilirubin: 0.3 mg/dL (ref 0.3–1.2)
Total Protein: 7.4 g/dL (ref 6.0–8.3)

## 2012-11-29 LAB — CBC
HCT: 41.5 % (ref 36.0–46.0)
Hemoglobin: 14 g/dL (ref 12.0–15.0)
MCH: 29.5 pg (ref 26.0–34.0)
MCHC: 33.7 g/dL (ref 30.0–36.0)
MCV: 87.6 fL (ref 78.0–100.0)
Platelets: 285 10*3/uL (ref 150–400)
RBC: 4.74 MIL/uL (ref 3.87–5.11)
RDW: 14.1 % (ref 11.5–15.5)
WBC: 6.6 10*3/uL (ref 4.0–10.5)

## 2012-11-29 LAB — TSH: TSH: 1.953 u[IU]/mL (ref 0.350–4.500)

## 2012-11-29 LAB — CALCIUM, IONIZED: Calcium, Ion: 1.46 mmol/L — ABNORMAL HIGH (ref 1.12–1.32)

## 2012-11-29 MED ORDER — HYDROCHLOROTHIAZIDE 12.5 MG PO CAPS: 12.5000 mg | ORAL_CAPSULE | Freq: Every day | ORAL | Status: DC

## 2012-11-29 MED ORDER — LOSARTAN POTASSIUM 25 MG PO TABS: 25.0000 mg | ORAL_TABLET | Freq: Every day | ORAL | Status: DC

## 2012-11-29 MED ORDER — OMEPRAZOLE 40 MG PO CPDR: 40.0000 mg | DELAYED_RELEASE_CAPSULE | Freq: Every day | ORAL | Status: DC

## 2012-11-29 NOTE — Assessment & Plan Note (Signed)
Likely multifactorial. Reflux is worsening. Add omeprazole, check an H. Pylori and avoid offending foods. Switch from lisinopril to losartan. Is noting some worsening shortness of breath and has a heavy smoking history referred to pulmonology as well. Chest x-ray negative today. Patient reports nonproductive cough present for roughly one year.

## 2012-11-29 NOTE — Assessment & Plan Note (Signed)
Recheck liver panel today.  

## 2012-11-29 NOTE — Assessment & Plan Note (Addendum)
Well controlled, but with cough will switch from Lisinopril to Losartan

## 2012-11-29 NOTE — Patient Instructions (Addendum)

## 2012-11-29 NOTE — Progress Notes (Signed)
Patient ID: Sandra [name], female   DOB: 12/24/1960, 52 y.o.   MRN: [id number] Sandra [name] [id number] [birth year] 11/29/2012      Progress Note-Follow Up  Subjective  Chief Complaint  Chief Complaint  Patient presents with  . Follow-up    on BP  . Injections    flu shot    HPI  Patient is a 52 year old Caucasian female who is in today in followup. He concern is a persistent cough. She believes it has been present for about a year. It is nonproductive but does not result. Her allergies are relatively well controlled at the moment but her heartburn continues to trouble her. She is dyspepsia abdominal discomfort bloating and nausea frequently despite ranitidine use. No chest pain or palpitations. Does complain of worsening shortness of breath and does acknowledge she smoked heavily for numerous years. No fevers or chills. No headaches or other new complaints noted today.  Past Medical History  Diagnosis Date  . Hypertension   . Chest pain   . Hyperlipidemia   . Rapid heart beat   . Chicken pox as achild  . Hoarseness 02/04/2012  . Ocular migraine   . GERD (gastroesophageal reflux disease)   . Preventative health care 02/04/2012  . Atypical chest pain 01/03/2011  . SUI (stress urinary incontinence, female) 02/04/2012    Sees Dr Newt Minion and they have discussed a bladder tack but so far she declines  . Overactive bladder 05/31/2012  . Elevated LFTs 05/31/2012  . Obesity, unspecified 05/31/2012  . Allergic rhinitis 05/31/2012  . Cough 11/29/2012  . H/O tobacco use, presenting hazards to health 11/29/2012    Smoked 1 1/2 ppd for 20 years quit in roughtly 2007    Past Surgical History  Procedure Laterality Date  . Pelvic laparoscopy  03-2004  . Wisdom tooth extraction  30 yrs ago  . Btl    . Tubal ligation    . Breast surgery      left breast excision of adenoma    Family History  Problem Relation Age of Onset  . Hypertension Mother 36    alive  . Cancer Mother     renal  . Hypertension Father 49    alive  . Atrial fibrillation Father   . Cancer Father     colon  . Hypertension Brother 56    alive  . Hypertension Sister   . Heart disease Paternal Aunt   . Heart disease Paternal Uncle   . Cancer Maternal Grandfather     lung/ smoker  . Emphysema Maternal Grandfather   . Heart disease Paternal Grandmother   . Other Paternal Grandfather     black lung  . Heart disease Paternal Grandfather   . Hypertension Sister     History   Social History  . Marital Status: Married    Spouse Name: N/A    Number of Children: 2  . Years of Education: N/A   Occupational History  . RN [employer]   Social History Main Topics  . Smoking status: Former Smoker -- 1.00 packs/day for 20 years    Types: Cigarettes    Quit date: 03/10/2005  . Smokeless tobacco: Never Used  . Alcohol Use: Yes     Comment: very rarely  . Drug Use: No  . Sexual Activity: Yes    Partners: Male   Other Topics Concern  . Not on file   Social History Narrative  . No narrative on file    Current Outpatient  Prescriptions on File Prior to Visit  Medication Sig Dispense Refill  . ALPRAZolam (XANAX) 0.25 MG tablet Take 1 tablet (0.25 mg total) by mouth at bedtime as needed for sleep or anxiety.  20 tablet  1  . aspirin 81 MG tablet Take 81 mg by mouth daily.        . Cholecalciferol (VITAMIN D PO) Take 2,000 mg by mouth daily. Vitamin D      . Coenzyme Q10 (CO Q 10) 100 MG CAPS Take 400 mg by mouth daily.      Marland Kitchen estradiol-norethindrone (ACTIVELLA) 1-0.5 MG per tablet Take 1 tablet by mouth daily.      . fluticasone (FLONASE) 50 MCG/ACT nasal spray Place 2 sprays into the nose daily as needed for rhinitis or allergies.  16 g  6  . OVER THE COUNTER MEDICATION Icool- daily      . OVER THE COUNTER MEDICATION Mega red- 1 capsule daily      . ranitidine (ZANTAC) 300 MG tablet Take 1 tablet (300 mg total) by mouth at bedtime.  30 tablet  5  . simvastatin (ZOCOR) 40 MG tablet  TAKE 1 TABLET BY MOUTH EVERY DAY  30 tablet  5   No current facility-administered medications on file prior to visit.    Allergies  Allergen Reactions  . Penicillins     Review of Systems  Review of Systems  Constitutional: Negative for fever and malaise/fatigue.  HENT: Positive for congestion. Negative for ear pain and sore throat.   Eyes: Negative for discharge.  Respiratory: Positive for cough. Negative for shortness of breath.   Cardiovascular: Negative for chest pain, palpitations and leg swelling.  Gastrointestinal: Positive for heartburn, nausea and abdominal pain. Negative for diarrhea, constipation, blood in stool and melena.  Genitourinary: Negative for dysuria.  Musculoskeletal: Negative for falls.  Skin: Negative for rash.  Neurological: Negative for loss of consciousness and headaches.  Endo/Heme/Allergies: Negative for polydipsia.  Psychiatric/Behavioral: Negative for depression and suicidal ideas. The patient is not nervous/anxious and does not have insomnia.     Objective  BP 110/84  Pulse 75  Temp(Src) 98.3 F (36.8 C) (Oral)  Ht 5\' 6"  (1.676 m)  Wt 189 lb (85.73 kg)  BMI 30.52 kg/m2  SpO2 94%  LMP 04/01/2011  Physical Exam  Physical Exam  Constitutional: She is oriented to person, place, and time and well-developed, well-nourished, and in no distress. No distress.  HENT:  Head: Normocephalic and atraumatic.  Eyes: Conjunctivae are normal.  Neck: Neck supple. No thyromegaly present.  Cardiovascular: Normal rate, regular rhythm and normal heart sounds.   No murmur heard. Pulmonary/Chest: Effort normal and breath sounds normal. She has no wheezes.  Abdominal: She exhibits no distension and no mass.  Musculoskeletal: She exhibits no edema.  Lymphadenopathy:    She has no cervical adenopathy.  Neurological: She is alert and oriented to person, place, and time.  Skin: Skin is warm and dry. No rash noted. She is not diaphoretic.  Psychiatric: Memory,  affect and judgment normal.    Lab Results  Component Value Date   TSH 2.251 05/31/2012   Lab Results  Component Value Date   WBC 9.0 02/04/2012   HGB 13.3 02/04/2012   HCT 41.3 02/04/2012   MCV 91.3 02/04/2012   PLT 289.0 02/04/2012   Lab Results  Component Value Date   CREATININE 0.7 02/04/2012   BUN 16 02/04/2012   NA 135 02/04/2012   K 4.2 02/04/2012   CL 101 02/04/2012  CO2 26 02/04/2012   Lab Results  Component Value Date   ALT 21 05/31/2012   AST 19 05/31/2012   ALKPHOS 60 05/31/2012   BILITOT 0.3 05/31/2012   Lab Results  Component Value Date   CHOL 189 05/31/2012   Lab Results  Component Value Date   HDL 45 05/31/2012   Lab Results  Component Value Date   LDLCALC 111* 05/31/2012   Lab Results  Component Value Date   TRIG 164* 05/31/2012   Lab Results  Component Value Date   CHOLHDL 4.2 05/31/2012     Assessment & Plan  Hypertension Well controlled, but with cough will switch from Lisinopril to Losartan  GERD (gastroesophageal reflux disease) Worsening with nausea and dyspepsia. Add Omeprazole and referred to her Gastroenterologist for further consideration. Add a probiotic  Elevated LFTs Recheck liver panel today  Cough Likely multifactorial. Reflux is worsening. Add omeprazole, check an H. Pylori and avoid offending foods. Switch from lisinopril to losartan. Is noting some worsening shortness of breath and has a heavy smoking history referred to pulmonology as well. Chest x-ray negative today. Patient reports nonproductive cough present for roughly one year.  H/O tobacco use, presenting hazards to health Now with worsening SOB and cough, referred to pulmonology for consideration

## 2012-11-29 NOTE — Assessment & Plan Note (Signed)
Worsening with nausea and dyspepsia. Add Omeprazole and referred to her Gastroenterologist for further consideration. Add a probiotic

## 2012-11-29 NOTE — Assessment & Plan Note (Signed)
Now with worsening SOB and cough, referred to pulmonology for consideration

## 2012-11-30 ENCOUNTER — Other Ambulatory Visit: Payer: Self-pay | Admitting: Family Medicine

## 2012-11-30 ENCOUNTER — Institutional Professional Consult (permissible substitution): Payer: 59 | Admitting: Pulmonary Disease

## 2012-11-30 LAB — PTH, INTACT AND CALCIUM
Calcium: 11.4 mg/dL — ABNORMAL HIGH (ref 8.4–10.5)
PTH: 65.2 pg/mL (ref 14.0–72.0)

## 2012-11-30 LAB — H. PYLORI ANTIBODY, IGG: H Pylori IgG: 0.76 {ISR}

## 2012-12-01 NOTE — Progress Notes (Signed)
Quick Note:  Patient Informed and voiced understanding ______ 

## 2012-12-05 ENCOUNTER — Other Ambulatory Visit: Payer: Self-pay | Admitting: Family Medicine

## 2012-12-06 NOTE — Telephone Encounter (Signed)
Rx request to pharmacy/[facility]  

## 2012-12-08 ENCOUNTER — Telehealth: Payer: Self-pay

## 2012-12-08 NOTE — Telephone Encounter (Signed)
PA for Omeprazole has been approved for coverage until 12-07-13

## 2012-12-16 ENCOUNTER — Encounter: Payer: Self-pay | Admitting: Endocrinology

## 2012-12-16 ENCOUNTER — Ambulatory Visit (INDEPENDENT_AMBULATORY_CARE_PROVIDER_SITE_OTHER): Payer: 59 | Admitting: Endocrinology

## 2012-12-16 MED ORDER — FUROSEMIDE 20 MG PO TABS: 20.0000 mg | ORAL_TABLET | Freq: Every day | ORAL | Status: DC

## 2012-12-16 NOTE — Patient Instructions (Addendum)
Please change the HCTZ to furosemide.  i have sent a prescription to your pharmacy Please come back for a follow-up appointment in 1 month.    1200 Calorie Diet The 1200 calorie diet limits calories to 1200 each day. Following this diet and making healthy meal choices can help improve overall health. It controls blood glucose (sugar) levels and can also help lower blood pressure and cholesterol.  SERVING SIZES Measuring foods and serving sizes helps to make sure you are getting the right amount of food. The list below tells how big or small some common serving sizes are.   1 oz.........4 stacked dice.  3 oz........Marland KitchenDeck of cards.  1 tsp.......Marland KitchenTip of little finger.  1 tbs......Marland KitchenMarland KitchenThumb.  2 tbs.......Marland KitchenGolf ball.   cup......Marland KitchenHalf of a fist.  1 cup.......Marland KitchenA fist. GUIDELINES FOR CHOOSING FOODS The goal of this diet is to eat a variety of foods and limit calories to 1200 each day. This can be done by choosing foods that are low in calories and fat. The diet also suggests eating small amounts of food frequently. Each meal or snack may include a protein food source to help you feel more satisfied. Try to eat about the same amount of food around the same time each day. This includes weekend days, travel days, and days off work. Space your meals about 4 to 5 hours apart, and add a snack between them, if you wish.  For example, a daily food plan could include breakfast, a morning snack, lunch, dinner, and an evening snack. Healthy meals and snacks have different types of foods, including whole grains, vegetables, fruits, lean meats, poultry, fish, and dairy products. As you plan your meals, select a variety of foods. Choose from the bread and starch, vegetable, fruit, dairy, and meat/protein groups. Examples of foods from each group are listed below, with their suggested serving sizes. Use measuring cups and spoons to become familiar with what a healthy portion looks like. Bread and Starch Each  serving equals 15 grams of carbohydrate.  1 slice bread.   bagel.   cup cold cereal (unsweetened).   cup hot cereal or mashed potatoes.  1 small potato (size of a computer mouse).   cup cooked pasta or rice.   English muffin.  1 cup broth-based soup.  3 cups of popcorn.  4 to 6 whole-wheat crackers.   cup cooked beans, peas, or corn. Vegetables Each serving equals 5 grams of carbohydrate.   cup cooked vegetables.  1 cup raw vegetables.   cup tomato or vegetable juice. Fruit Each serving equals 15 grams of carbohydrate.  1 small apple or orange.  1  cup watermelon or strawberries.   cup applesauce (no sugar added).  2 tbs raisins.   banana.   cup canned fruit, packed in water or in its own juice.   cup unsweetened fruit juice. Dairy Each serving equals 12 to 15 grams of carbohydrate.  1 cup fat-free milk.  6 oz artificially sweetened yogurt or plain yogurt.  1 cup low-fat buttermilk.  1 cup soy milk.  1 cup almond milk. Meat/Protein  1 large egg.  2 to 3 oz meat, poultry, or fish.   cup low-fat cottage cheese.  1 tbs peanut butter.  1 oz low-fat cheese.   cup tuna, packed in water.   cup tofu. Fat  1 tsp oil.  1 tsp trans-fat-free margarine.  1 tsp butter.  1 tsp mayonnaise.  2 tbs avocado.  1 tbs salad dressing.  1 tbs cream cheese.  2 tbs sour cream. SAMPLE 1200 CALORIE DIET PLAN Breakfast  1 cup fat-free milk (1 carb serving).  1 small orange (1 carb serving).  1 scrambled egg.  1 slice whole-wheat toast (1 carb serving). Lunch  Sandwich  2 slices whole-wheat bread (2 carb servings).  2 oz lean meat.  2 tsp reduced fat mayonnaise.  1 lettuce leaf.  2 slices tomato.  1 cup carrot sticks. Afternoon Snack  1 small apple (1 carb serving).  1 string cheese. Dinner  2 oz meat.  1 small baked potato (1 carb serving).  1 tsp trans-fat-free margarine.  1 cup steamed broccoli.  1  cup fat-free milk (1 carb serving). Evening Snack   small banana (1 carb serving).  6 vanilla wafers (1 carb serving). MEAL PLAN You can use this worksheet to help you make a daily meal plan based on the 1200 calorie diet suggestions. You may interchange carbohydrate containing foods (dairy, starches, and fruits). Select a variety of fresh foods of varying colors and flavors. The total amount of carbohydrate in your meals or snacks is more important than making sure you include all of the food groups every time you eat. You can choose from approximately this many of the following foods to build your day's meals:  6 Starches.  3 Vegetables.  2 Fruits.  2 Dairy.  4 to 6 oz Meat/Protein.  Up to 3 Fats. Your dietician can use this worksheet to help you decide how many servings and which types of foods are right for you. BREAKFAST Food Group and Servings / Food Choice Starch _________________________________________________________ Dairy __________________________________________________________ Fruit ___________________________________________________________ Meat/Protein____________________________________________________ Fat ____________________________________________________________ LUNCH Food Group and Servings / Food Choice  Starch _________________________________________________________ Meat/Protein ___________________________________________________ Vegetables _____________________________________________________ Fruit __________________________________________________________ Dairy __________________________________________________________ Fat ____________________________________________________________ Aura Fey Food Group and Servings / Food Choice Dairy __________________________________________________________ Fruit ___________________________________________________________ Starch  __________________________________________________________ Meat/Protein____________________________________________________ Laural Golden Food Group and Servings / Food Choice Starch _________________________________________________________ Meat/Protein ___________________________________________________ Dairy __________________________________________________________ Vegetables _____________________________________________________ Fruit __________________________________________________________ Fat ____________________________________________________________ Lollie Sails Food Group and Servings / Food Choice Fruit ___________________________________________________________ Meat/Protein ____________________________________________________ Dairy __________________________________________________________ Starch __________________________________________________________ DAILY TOTALS Starches _________________________ Vegetables _______________________ Fruits ____________________________ Dairy ____________________________ Meat/Protein______________________ Fats _____________________________ Document Released: 09/16/2004 Document Revised: 05/19/2011 Document Reviewed: 01/11/2009 [employer] Patient Information 2014 [employer]

## 2012-12-16 NOTE — Progress Notes (Signed)
Subjective:    Patient ID: Sandra [name], female    DOB: [birth year], 52 y.o.   MRN: [id number]  HPI In late 2013, pt was incidentally noted to have hypercalcemia.  She says she has never had this before.  She has never had urolithiasis, osteoporosis, or bony fracture.  She has slight abdominal pain, and assoc constipation.   Past Medical History  Diagnosis Date  . Hypertension   . Chest pain   . Hyperlipidemia   . Rapid heart beat   . Chicken pox as achild  . Hoarseness 02/04/2012  . Ocular migraine   . GERD (gastroesophageal reflux disease)   . Preventative health care 02/04/2012  . Atypical chest pain 01/03/2011  . SUI (stress urinary incontinence, female) 02/04/2012    Sees Dr Newt Minion and they have discussed a bladder tack but so far she declines  . Overactive bladder 05/31/2012  . Elevated LFTs 05/31/2012  . Obesity, unspecified 05/31/2012  . Allergic rhinitis 05/31/2012  . Cough 11/29/2012  . H/O tobacco use, presenting hazards to health 11/29/2012    Smoked 1 1/2 ppd for 20 years quit in roughtly 2007    Past Surgical History  Procedure Laterality Date  . Pelvic laparoscopy  03-2004  . Wisdom tooth extraction  30 yrs ago  . Btl    . Tubal ligation    . Breast surgery      left breast excision of adenoma    History   Social History  . Marital Status: Married    Spouse Name: N/A    Number of Children: 2  . Years of Education: N/A   Occupational History  . RN [employer]   Social History Main Topics  . Smoking status: Former Smoker -- 1.00 packs/day for 20 years    Types: Cigarettes    Quit date: 03/10/2005  . Smokeless tobacco: Never Used  . Alcohol Use: Yes     Comment: very rarely  . Drug Use: No  . Sexual Activity: Yes    Partners: Male   Other Topics Concern  . Not on file   Social History Narrative  . No narrative on file    Current Outpatient Prescriptions on File Prior to Visit  Medication Sig Dispense Refill  . ALPRAZolam (XANAX)  0.25 MG tablet Take 1 tablet (0.25 mg total) by mouth at bedtime as needed for sleep or anxiety.  20 tablet  1  . aspirin 81 MG tablet Take 81 mg by mouth daily.        . Cholecalciferol (VITAMIN D PO) Take 2,000 mg by mouth daily. Vitamin D      . Coenzyme Q10 (CO Q 10) 100 MG CAPS Take 400 mg by mouth daily.      Marland Kitchen estradiol-norethindrone (ACTIVELLA) 1-0.5 MG per tablet Take 1 tablet by mouth daily.      . fluticasone (FLONASE) 50 MCG/ACT nasal spray Place 2 sprays into the nose daily as needed for rhinitis or allergies.  16 g  6  . hydrochlorothiazide (MICROZIDE) 12.5 MG capsule Take 1 capsule (12.5 mg total) by mouth daily.  30 capsule  3  . losartan (COZAAR) 25 MG tablet Take 1 tablet (25 mg total) by mouth daily.  30 tablet  3  . omeprazole (PRILOSEC) 40 MG capsule Take 1 capsule (40 mg total) by mouth daily.  30 capsule  3  . OVER THE COUNTER MEDICATION Icool- daily      . OVER THE COUNTER MEDICATION Mega red- 1 capsule  daily      . oxybutynin (DITROPAN-XL) 10 MG 24 hr tablet Take 10 mg by mouth daily.      . ranitidine (ZANTAC) 300 MG tablet Take 1 tablet (300 mg total) by mouth at bedtime.  30 tablet  5  . simvastatin (ZOCOR) 40 MG tablet TAKE 1 TABLET BY MOUTH EVERY DAY  30 tablet  5   No current facility-administered medications on file prior to visit.   Allergies  Allergen Reactions  . Penicillins    Family History  Problem Relation Age of Onset  . Hypertension Mother 15    alive  . Cancer Mother     renal  . Hypertension Father 52    alive  . Atrial fibrillation Father   . Cancer Father     colon  . Hypertension Brother 56    alive  . Hypertension Sister   . Heart disease Paternal Aunt   . Heart disease Paternal Uncle   . Cancer Maternal Grandfather     lung/ smoker  . Emphysema Maternal Grandfather   . Heart disease Paternal Grandmother   . Other Paternal Grandfather     black lung  . Heart disease Paternal Grandfather   . Hypertension Sister    hypercalcemia: none  LMP 04/01/2011  Review of Systems denies galactorrhea, hematuria, memory loss, numbness, arthralgias, muscle weakness, urinary frequency, skin rash, visual loss, sob, rhinorrhea, easy bruising, and depression.  She has menopausal sxs, weight gain, and nausea.    Objective:   Physical Exam VS: see vs page GEN: no distress HEAD: head: no deformity eyes: no periorbital swelling, no proptosis external nose and ears are normal mouth: no lesion seen NECK: supple, thyroid is not enlarged CHEST WALL: no deformity LUNGS:  Clear to auscultation CV: reg rate and rhythm, no murmur ABD: abdomen is soft, nontender.  no hepatosplenomegaly.  not distended.  no hernia MUSCULOSKELETAL: muscle bulk and strength are grossly normal.  no obvious joint swelling.  gait is normal and steady.  No deformity. EXTEMITIES: no deformity.  no ulcer on the feet.  feet are of normal color and temp.  Trace bilat leg edema PULSES: dorsalis pedis intact bilat.  no carotid bruit NEURO:  cn 2-12 grossly intact.   readily moves all 4's.  sensation is intact to touch on the feet SKIN:  Normal texture and temperature.  No rash or suspicious lesion is visible.   NODES:  None palpable at the neck PSYCH: alert, oriented x3.  Does not appear anxious nor depressed.  Lab Results  Component Value Date   PTH 65.2 11/29/2012   CALCIUM 11.4* 11/29/2012   CALCIUM 11.4* 11/29/2012   CAION 1.46* 11/29/2012   PHOS 2.9 11/29/2012      Assessment & Plan:  Hypercalcemia, with borderline PTH, uncertain etiology HTN: although lasix would be less effective than HCTZ, we should make this change now, to try to help the Ca++ level.  i'll recheck the BP upon return here. Constipation and other GI sxs, possibly contributed to by the hypercalcemia.

## 2012-12-28 ENCOUNTER — Ambulatory Visit (INDEPENDENT_AMBULATORY_CARE_PROVIDER_SITE_OTHER): Payer: 59 | Admitting: Pulmonary Disease

## 2012-12-28 ENCOUNTER — Encounter: Payer: Self-pay | Admitting: Pulmonary Disease

## 2012-12-28 ENCOUNTER — Other Ambulatory Visit: Payer: Self-pay | Admitting: *Deleted

## 2012-12-28 VITALS — BP 110/80 | HR 88 | Temp 98.7°F | Ht 65.0 in | Wt 190.0 lb

## 2012-12-28 DIAGNOSIS — R0609 Other forms of dyspnea: Secondary | ICD-10-CM

## 2012-12-28 DIAGNOSIS — R0989 Other specified symptoms and signs involving the circulatory and respiratory systems: Secondary | ICD-10-CM

## 2012-12-28 DIAGNOSIS — R0600 Dyspnea, unspecified: Secondary | ICD-10-CM

## 2012-12-28 DIAGNOSIS — R0602 Shortness of breath: Secondary | ICD-10-CM

## 2012-12-28 LAB — BASIC METABOLIC PANEL
BUN: 11 mg/dL (ref 6–23)
CO2: 24 mEq/L (ref 19–32)
Calcium: 10.6 mg/dL — ABNORMAL HIGH (ref 8.4–10.5)
Chloride: 105 mEq/L (ref 96–112)
Creat: 0.63 mg/dL (ref 0.50–1.10)
Glucose, Bld: 85 mg/dL (ref 70–99)
Potassium: 3.8 mEq/L (ref 3.5–5.3)
Sodium: 138 mEq/L (ref 135–145)

## 2012-12-28 NOTE — Assessment & Plan Note (Addendum)
Lung function does not show evidence of airway obstruction Cause of her oxygen desaturation with exertion is unclear Rpt Ca is 10.6, BNp Negative d-dimer makes poor embolism unlikely We'll proceed with 2-D echo with bubble study

## 2012-12-28 NOTE — Progress Notes (Signed)
Subjective:    Patient ID: Sandra [name], female    DOB: 02-27-1961, 52 y.o.   MRN: [id number]  HPI 52 year old ex-smoker presents for evaluation of dyspnea on exertion. She smoked about a pack per day for 20 years before she quit in 2007. She reports class II dyspnea especially on climbing stairs or walking uphill. She denies cough or wheezing or frequent chest colds. This list of chest pain, palpitations, paroxysmal nocturnal dyspnea or orthopnea. She reports mild pedal edema for which was placed thiazide diuretic. She was subsequently evaluated for hypercalcemia (ellison) and changed to furosemide. No cardiac issues have been identified. She developed a tickling cough & lisinopril was dc'd 11/29/12, cough almost gone. Sudafed in the past caused palpitations. She reports seasonal allergies and takes Flonase intranasally. She does have significant GERD and is maintained on PPI and H2 blocker. & EGD is planned by Dr Kinnie Scales. She reports a wt gain of 50 lbs over the past 5y.  Spirometry did not show evidence of airway obstruction with FEV1 2.37-85% and ratio of 83. Surprisingly she did desaturate to 90% on walking 3 laps in the office. Labs showed calcium of 10.6 and negative d-dimer   Past Medical History  Diagnosis Date  . Hypertension   . Chest pain   . Hyperlipidemia   . Rapid heart beat   . Chicken pox as achild  . Hoarseness 02/04/2012  . Ocular migraine   . GERD (gastroesophageal reflux disease)   . Preventative health care 02/04/2012  . Atypical chest pain 01/03/2011  . SUI (stress urinary incontinence, female) 02/04/2012    Sees Dr Newt Minion and they have discussed a bladder tack but so far she declines  . Overactive bladder 05/31/2012  . Elevated LFTs 05/31/2012  . Obesity, unspecified 05/31/2012  . Allergic rhinitis 05/31/2012  . Cough 11/29/2012  . H/O tobacco use, presenting hazards to health 11/29/2012    Smoked 1 1/2 ppd for 20 years quit in roughtly 2007    Past  Surgical History  Procedure Laterality Date  . Pelvic laparoscopy  03-2004  . Wisdom tooth extraction  30 yrs ago  . Btl    . Tubal ligation    . Breast surgery      left breast excision of adenoma    Allergies  Allergen Reactions  . Penicillins     History   Social History  . Marital Status: Married    Spouse Name: N/A    Number of Children: 2  . Years of Education: N/A   Occupational History  . RN   .  [employer]   Social History Main Topics  . Smoking status: Former Smoker -- 1.00 packs/day for 20 years    Types: Cigarettes    Quit date: 03/10/2005  . Smokeless tobacco: Never Used  . Alcohol Use: Yes     Comment: very rarely  . Drug Use: No  . Sexual Activity: Yes    Partners: Male   Other Topics Concern  . Not on file   Social History Narrative  . No narrative on file    Family History  Problem Relation Age of Onset  . Hypertension Mother 77    alive  . Renal cancer Mother     renal  . Hypertension Father 60    alive  . Atrial fibrillation Father   . Colon cancer Father     colon  . Hypertension Brother 56    alive  . Hypertension Sister   . Heart  disease Paternal Aunt   . Heart disease Paternal Uncle   . Lung cancer Maternal Grandfather     lung/ smoker  . Emphysema Maternal Grandfather   . Heart disease Paternal Grandmother   . Other Paternal Grandfather     black lung  . Heart disease Paternal Grandfather   . Hypertension Sister   . COPD Sister     Review of Systems  Constitutional: Positive for unexpected weight change. Negative for fever.  HENT: Negative for congestion, dental problem, ear pain, nosebleeds, postnasal drip, rhinorrhea, sinus pressure, sneezing, sore throat and trouble swallowing.   Eyes: Negative for redness and itching.  Respiratory: Positive for shortness of breath. Negative for cough, chest tightness and wheezing.   Cardiovascular: Negative for palpitations and leg swelling.  Gastrointestinal: Positive for  abdominal pain. Negative for nausea and vomiting.  Genitourinary: Negative for dysuria.  Musculoskeletal: Negative for joint swelling.  Skin: Negative for rash.  Neurological: Positive for headaches.  Hematological: Does not bruise/bleed easily.  Psychiatric/Behavioral: Negative for dysphoric mood. The patient is not nervous/anxious.        Objective:   Physical Exam  Gen. Pleasant, well-nourished, in no distress, normal affect ENT - no lesions, no post nasal drip Neck: No JVD, no thyromegaly, no carotid bruits Lungs: no use of accessory muscles, no dullness to percussion, clear without rales or rhonchi  Cardiovascular: Rhythm regular, heart sounds  normal, no murmurs or gallops, no peripheral edema Abdomen: soft and non-tender, no hepatosplenomegaly, BS normal. Musculoskeletal: No deformities, no cyanosis or clubbing Neuro:  alert, non focal       Assessment & Plan:

## 2012-12-28 NOTE — Patient Instructions (Signed)
Lung function is Ok Your oxygen level dips mildly with walking Blood work today If positive, will proceed with CT scan of chest to r/o blood clot

## 2012-12-29 ENCOUNTER — Other Ambulatory Visit: Payer: Self-pay | Admitting: Pulmonary Disease

## 2012-12-29 DIAGNOSIS — R0600 Dyspnea, unspecified: Secondary | ICD-10-CM

## 2012-12-29 LAB — D-DIMER, QUANTITATIVE: D-Dimer, Quant: 0.27 ug{FEU}/mL (ref 0.00–0.48)

## 2012-12-29 LAB — BRAIN NATRIURETIC PEPTIDE: Brain Natriuretic Peptide: 20.4 pg/mL (ref 0.0–100.0)

## 2013-01-11 ENCOUNTER — Ambulatory Visit (INDEPENDENT_AMBULATORY_CARE_PROVIDER_SITE_OTHER): Payer: 59 | Admitting: Endocrinology

## 2013-01-11 NOTE — Patient Instructions (Addendum)
Here is a letter to request a 24-HR urine for calcium.  Please bring it to a hospital or commercial lab near you.   Please come back for a follow-up appointment in 4 months Please continue the same medications.

## 2013-01-11 NOTE — Progress Notes (Signed)
Subjective:    Patient ID: Sandra [name], female    DOB: September 21, 1960, 52 y.o.   MRN: [id number]  HPI The state of at least three ongoing medical problems is addressed today, with interval history of each noted here: In late 2013, pt was incidentally noted to have hypercalcemia.  She says she has never had this before. She has never had urolithiasis, osteoporosis, or bony fracture.  In October of 2014, HCTZ was changed to lasix.   Edema: pt says no worse. HTN: denies sob Past Medical History  Diagnosis Date  . Hypertension   . Chest pain   . Hyperlipidemia   . Rapid heart beat   . Chicken pox as achild  . Hoarseness 02/04/2012  . Ocular migraine   . GERD (gastroesophageal reflux disease)   . Preventative health care 02/04/2012  . Atypical chest pain 01/03/2011  . SUI (stress urinary incontinence, female) 02/04/2012    Sees Dr Newt Minion and they have discussed a bladder tack but so far she declines  . Overactive bladder 05/31/2012  . Elevated LFTs 05/31/2012  . Obesity, unspecified 05/31/2012  . Allergic rhinitis 05/31/2012  . Cough 11/29/2012  . H/O tobacco use, presenting hazards to health 11/29/2012    Smoked 1 1/2 ppd for 20 years quit in roughtly 2007    Past Surgical History  Procedure Laterality Date  . Pelvic laparoscopy  03-2004  . Wisdom tooth extraction  30 yrs ago  . Btl    . Tubal ligation    . Breast surgery      left breast excision of adenoma    History   Social History  . Marital Status: Married    Spouse Name: N/A    Number of Children: 2  . Years of Education: N/A   Occupational History  . RN   .  [employer]   Social History Main Topics  . Smoking status: Former Smoker -- 1.00 packs/day for 20 years    Types: Cigarettes    Quit date: 03/10/2005  . Smokeless tobacco: Never Used  . Alcohol Use: Yes     Comment: very rarely  . Drug Use: No  . Sexual Activity: Yes    Partners: Male   Other Topics Concern  . Not on file   Social  History Narrative  . No narrative on file    Current Outpatient Prescriptions on File Prior to Visit  Medication Sig Dispense Refill  . aspirin 81 MG tablet Take 81 mg by mouth daily.        . Cholecalciferol (VITAMIN D PO) Take 2,000 mg by mouth daily. Vitamin D      . Coenzyme Q10 (CO Q 10) 100 MG CAPS Take 400 mg by mouth daily.      Marland Kitchen estradiol-norethindrone (ACTIVELLA) 1-0.5 MG per tablet Take 1 tablet by mouth daily.      . fluticasone (FLONASE) 50 MCG/ACT nasal spray Place 2 sprays into the nose daily as needed for rhinitis or allergies.  16 g  6  . furosemide (LASIX) 20 MG tablet Take 1 tablet (20 mg total) by mouth daily.  30 tablet  11  . losartan (COZAAR) 25 MG tablet Take 1 tablet (25 mg total) by mouth daily.  30 tablet  3  . omeprazole (PRILOSEC) 40 MG capsule Take 1 capsule (40 mg total) by mouth daily.  30 capsule  3  . OVER THE COUNTER MEDICATION Icool- daily      . OVER THE COUNTER MEDICATION Mega  red- 1 capsule daily      . oxybutynin (DITROPAN-XL) 10 MG 24 hr tablet Take 10 mg by mouth daily.      . ranitidine (ZANTAC) 300 MG tablet Take 1 tablet (300 mg total) by mouth at bedtime.  30 tablet  5  . simvastatin (ZOCOR) 40 MG tablet TAKE 1 TABLET BY MOUTH EVERY DAY  30 tablet  5   No current facility-administered medications on file prior to visit.    Allergies  Allergen Reactions  . Penicillins     Family History  Problem Relation Age of Onset  . Hypertension Mother 61    alive  . Renal cancer Mother     renal  . Hypertension Father 62    alive  . Atrial fibrillation Father   . Colon cancer Father     colon  . Hypertension Brother 56    alive  . Hypertension Sister   . Heart disease Paternal Aunt   . Heart disease Paternal Uncle   . Lung cancer Maternal Grandfather     lung/ smoker  . Emphysema Maternal Grandfather   . Heart disease Paternal Grandmother   . Other Paternal Grandfather     black lung  . Heart disease Paternal Grandfather   .  Hypertension Sister   . COPD Sister     BP 122/76  Pulse 80  Ht 5\' 5"  (1.651 m)  Wt 192 lb (87.091 kg)  BMI 31.95 kg/m2  SpO2 97%  LMP 04/01/2011    Review of Systems Denies cramps.  She has doe    Objective:   Physical Exam VITAL SIGNS:  See vs page GENERAL: no distress Ext: no edema.     Lab Results  Component Value Date   PTH 65.2 11/29/2012   CALCIUM 10.6* 12/28/2012   CAION 1.46* 11/29/2012   PHOS 2.9 11/29/2012      Assessment & Plan:  Hypercalcemia, uncertain etiology: improved.  We'll need to give this more time in order to determine the cause.   HTN: well-controlled, although lasix is in general less effective than HCTZ Edema: better now on lasix

## 2013-01-12 ENCOUNTER — Telehealth: Payer: Self-pay | Admitting: Endocrinology

## 2013-01-12 NOTE — Telephone Encounter (Signed)
Left message pt to call back

## 2013-01-13 ENCOUNTER — Encounter (HOSPITAL_COMMUNITY): Payer: Self-pay

## 2013-01-13 ENCOUNTER — Ambulatory Visit (HOSPITAL_COMMUNITY): Payer: 59 | Attending: Internal Medicine

## 2013-01-13 DIAGNOSIS — I079 Rheumatic tricuspid valve disease, unspecified: Secondary | ICD-10-CM | POA: Insufficient documentation

## 2013-01-13 DIAGNOSIS — R0600 Dyspnea, unspecified: Secondary | ICD-10-CM

## 2013-01-13 DIAGNOSIS — R0609 Other forms of dyspnea: Secondary | ICD-10-CM | POA: Insufficient documentation

## 2013-01-13 DIAGNOSIS — R0602 Shortness of breath: Secondary | ICD-10-CM

## 2013-01-13 DIAGNOSIS — R609 Edema, unspecified: Secondary | ICD-10-CM | POA: Insufficient documentation

## 2013-01-13 DIAGNOSIS — R0989 Other specified symptoms and signs involving the circulatory and respiratory systems: Secondary | ICD-10-CM | POA: Insufficient documentation

## 2013-01-13 DIAGNOSIS — Z87891 Personal history of nicotine dependence: Secondary | ICD-10-CM | POA: Insufficient documentation

## 2013-01-13 DIAGNOSIS — I10 Essential (primary) hypertension: Secondary | ICD-10-CM | POA: Insufficient documentation

## 2013-01-13 DIAGNOSIS — R079 Chest pain, unspecified: Secondary | ICD-10-CM | POA: Insufficient documentation

## 2013-01-13 DIAGNOSIS — E785 Hyperlipidemia, unspecified: Secondary | ICD-10-CM | POA: Insufficient documentation

## 2013-01-13 NOTE — Progress Notes (Signed)
Patient ID: Sandra [name], female   DOB: [birth year], 52 y.o.   MRN: [id number] IV with 22 G angiocath (R) upper foreman x 1 for an Echo Bubble Study, tolerated well. Irean Hong, RN.

## 2013-01-13 NOTE — Progress Notes (Signed)
Echocardiogram performed.  

## 2013-01-17 ENCOUNTER — Telehealth: Payer: Self-pay | Admitting: Pulmonary Disease

## 2013-01-17 NOTE — Telephone Encounter (Signed)
Result Note    Normal pump function   No shunt   I spoke with patient about results and she verbalized understanding and had no questions

## 2013-01-18 ENCOUNTER — Other Ambulatory Visit: Payer: Self-pay | Admitting: Pulmonary Disease

## 2013-01-18 ENCOUNTER — Ambulatory Visit: Payer: 59 | Admitting: Endocrinology

## 2013-01-18 ENCOUNTER — Ambulatory Visit (INDEPENDENT_AMBULATORY_CARE_PROVIDER_SITE_OTHER): Payer: 59 | Admitting: Pulmonary Disease

## 2013-01-18 ENCOUNTER — Encounter: Payer: Self-pay | Admitting: Pulmonary Disease

## 2013-01-18 ENCOUNTER — Other Ambulatory Visit: Payer: Self-pay | Admitting: Endocrinology

## 2013-01-18 DIAGNOSIS — R0600 Dyspnea, unspecified: Secondary | ICD-10-CM

## 2013-01-18 DIAGNOSIS — R0609 Other forms of dyspnea: Secondary | ICD-10-CM

## 2013-01-18 LAB — SEDIMENTATION RATE: Sed Rate: 6 mm/hr (ref 0–22)

## 2013-01-18 MED ORDER — POTASSIUM CHLORIDE ER 10 MEQ PO TBCR: 10.0000 meq | EXTENDED_RELEASE_TABLET | Freq: Every day | ORAL | Status: DC

## 2013-01-18 NOTE — Patient Instructions (Addendum)
Blood work today Echo was normal - doubt cardiac or pulmonary cause Await 24h calcium results - will talk to dr blythe about possible second opinion for hyperparathyroid Increase lasix to 40 mg daily x 5ds , then back down to 20 mg daily - take potassium x 5 days

## 2013-01-18 NOTE — Assessment & Plan Note (Signed)
Rpt ca/ PTH

## 2013-01-18 NOTE — Assessment & Plan Note (Signed)
Echo was normal except for RVSP 38 - doubt cardiac or pulmonary cause, also note nml stress echo in 2012, doubt pulmonary hypertension is an issue Cardiopulmonary stress testing will be low yield Await 24h calcium results - will talk to dr blythe about possible second opinion for hyperparathyroid Increase lasix to 40 mg daily x 5ds , then back down to 20 mg daily - take potassium x 5 days  Will obtian basic rheum wu incl ANA, ESR, RA factor

## 2013-01-18 NOTE — Progress Notes (Signed)
  Subjective:    Patient ID: Sandra [name], female    DOB: 05-30-1960, 52 y.o.   MRN: [id number]  HPI  52 year old ex-smoker presents for evaluation of dyspnea on exertion.  She smoked about a pack per day for 20 years before she quit in 2007.  She reports class II dyspnea especially on climbing stairs or walking uphill.  She reports mild pedal edema for which was placed thiazide diuretic. She was subsequently evaluated for hypercalcemia (ellison) and changed to furosemide. No cardiac issues have been identified.  She developed a tickling cough & lisinopril was dc'd 11/29/12, cough almost gone.  Sudafed in the past caused palpitations.  She reports seasonal allergies and takes Flonase intranasally. She does have significant GERD and is maintained on PPI and H2 blocker.  & EGD is planned by Dr Kinnie Scales. She reports a wt gain of 50 lbs over the past 5y.  Spirometry did not show evidence of airway obstruction with FEV1 2.37-85% and ratio of 83.  Surprisingly she did desaturate to 90% on walking 3 laps in the office.  Labs showed calcium of 10.6 and negative d-dimer Echo - nml LV fn, no shunt  01/18/2013  Chief Complaint  Patient presents with  . Follow-up    3 wk rtn, pt c/o increased SOB, less stamina, fatigue.     No cough, wheeze,  Rpt Ca was 10.6 Tearful today C/o stiffness of hands & al joints ache esp in am Of note stress echo was nml 2012 Satn better today 95% RA D-dimer neg, BNp low  Past Medical History  Diagnosis Date  . Hypertension   . Chest pain   . Hyperlipidemia   . Rapid heart beat   . Chicken pox as achild  . Hoarseness 02/04/2012  . Ocular migraine   . GERD (gastroesophageal reflux disease)   . Preventative health care 02/04/2012  . Atypical chest pain 01/03/2011  . SUI (stress urinary incontinence, female) 02/04/2012    Sees Dr Newt Minion and they have discussed a bladder tack but so far she declines  . Overactive bladder 05/31/2012  . Elevated LFTs  05/31/2012  . Obesity, unspecified 05/31/2012  . Allergic rhinitis 05/31/2012  . Cough 11/29/2012  . H/O tobacco use, presenting hazards to health 11/29/2012    Smoked 1 1/2 ppd for 20 years quit in roughtly 2007      Review of Systems neg for any significant sore throat, dysphagia, itching, sneezing, nasal congestion or excess/ purulent secretions, fever, chills, sweats, unintended wt loss, pleuritic or exertional cp, hempoptysis, orthopnea pnd or change in chronic leg swelling. Also denies presyncope, palpitations, heartburn, abdominal pain, nausea, vomiting, diarrhea or change in bowel or urinary habits, dysuria,hematuria, rash, arthralgias, visual complaints, headache, numbness weakness or ataxia.     Objective:   Physical Exam  Gen. Pleasant, well-nourished, in no distress ENT - no lesions, no post nasal drip Neck: No JVD, no thyromegaly, no carotid bruits Lungs: no use of accessory muscles, no dullness to percussion, clear without rales or rhonchi  Cardiovascular: Rhythm regular, heart sounds  normal, no murmurs or gallops, no peripheral edema Musculoskeletal: No deformities, no cyanosis or clubbing         Assessment & Plan:

## 2013-01-19 LAB — RHEUMATOID FACTOR: Rhuematoid fact SerPl-aCnc: <10 IU/mL (ref <=14)

## 2013-01-19 LAB — CALCIUM, URINE, 24 HOUR
Calcium, 24 hour urine: 432 mg/d — ABNORMAL HIGH (ref 100–250)
Calcium, Ur: 16 mg/dL

## 2013-01-19 LAB — CREATININE, URINE, 24 HOUR
Creatinine, 24H Ur: 2625 mg/d — ABNORMAL HIGH (ref 700–1800)
Creatinine, Urine: 97.2 mg/dL

## 2013-01-19 LAB — CALCIUM, IONIZED: Calcium, Ion: 1.43 mmol/L — ABNORMAL HIGH (ref 1.12–1.32)

## 2013-01-20 ENCOUNTER — Telehealth: Payer: Self-pay | Admitting: Pulmonary Disease

## 2013-01-20 NOTE — Telephone Encounter (Signed)
Spoke with Katrina at [facility] Dr Vassie Loll ordered a parathyroid hormone test on pt The specimen has to be frozen however, and by the time the courier picked it up and took it to the lab, it had thawed  Katrina has already spoke with the pt how agrees to come back in for another blood draw  New order was faxed to her attn at [phone] Nothing further needed

## 2013-01-27 ENCOUNTER — Telehealth: Payer: Self-pay | Admitting: *Deleted

## 2013-01-27 NOTE — Telephone Encounter (Signed)
She can do this when she goes to endocrine consult

## 2013-01-27 NOTE — Telephone Encounter (Signed)
I called [facility] and confirmed they did not receive any blood specimen for [name] lab. This is still has future in the system. Do you want me to call pt to bring her back in to have this done? Please advise thanks

## 2013-02-13 ENCOUNTER — Encounter: Payer: Self-pay | Admitting: Endocrinology

## 2013-03-14 ENCOUNTER — Telehealth: Payer: Self-pay | Admitting: Family Medicine

## 2013-03-14 ENCOUNTER — Other Ambulatory Visit: Payer: Self-pay | Admitting: Family Medicine

## 2013-03-14 NOTE — Telephone Encounter (Signed)
So patient did have persistently mildly elevated calcium. I will refer to endocrinology for further consideration at this point. Unclear cause

## 2013-03-14 NOTE — Telephone Encounter (Signed)
Had labs etc done after seeing Dr Alva/  Dr Elsworth Soho said he would get with Dr B and have her call with the results

## 2013-03-14 NOTE — Telephone Encounter (Signed)
Please advise 

## 2013-03-15 NOTE — Telephone Encounter (Signed)
Left a message for pt to return call to [facility] for assistance with this

## 2013-03-15 NOTE — Telephone Encounter (Signed)
Patient has seen Dr Loanne Drilling twice and states she would like to be referred to another endocrinologist. Please advise?

## 2013-03-15 NOTE — Telephone Encounter (Signed)
OK we can use Dr Cruzita Lederer in our system if she agrees or we can refer to an outside group like Dr Buddy Duty who is very good. What is patient preference

## 2013-03-25 NOTE — Telephone Encounter (Signed)
Pt is requesting to see Dr Crist Infante will proceed with referrral

## 2013-04-23 ENCOUNTER — Other Ambulatory Visit: Payer: Self-pay | Admitting: Family Medicine

## 2013-04-28 ENCOUNTER — Encounter: Payer: Self-pay | Admitting: Family Medicine

## 2013-04-29 ENCOUNTER — Encounter: Payer: Self-pay | Admitting: Cardiovascular Disease

## 2013-05-12 ENCOUNTER — Ambulatory Visit: Payer: 59 | Admitting: Endocrinology

## 2013-06-16 ENCOUNTER — Other Ambulatory Visit (HOSPITAL_COMMUNITY): Payer: Self-pay | Admitting: Internal Medicine

## 2013-06-16 DIAGNOSIS — E213 Hyperparathyroidism, unspecified: Secondary | ICD-10-CM

## 2013-07-07 ENCOUNTER — Encounter (HOSPITAL_COMMUNITY)
Admission: RE | Admit: 2013-07-07 | Discharge: 2013-07-07 | Disposition: A | Discharge status: Home / Self Care | Payer: 59 | Source: Ambulatory Visit | Attending: Internal Medicine | Admitting: Internal Medicine

## 2013-07-07 ENCOUNTER — Ambulatory Visit (HOSPITAL_COMMUNITY)
Admission: RE | Admit: 2013-07-07 | Discharge: 2013-07-07 | Disposition: A | Discharge status: Home / Self Care | Payer: 59 | Source: Ambulatory Visit | Attending: Internal Medicine | Admitting: Internal Medicine

## 2013-07-07 DIAGNOSIS — E213 Hyperparathyroidism, unspecified: Secondary | ICD-10-CM

## 2013-07-07 MED ORDER — TECHNETIUM TC 99M SESTAMIBI GENERIC - CARDIOLITE
27.0000 | Freq: Once | INTRAVENOUS | Status: AC | PRN
  Administered 2013-07-07: 27 via INTRAVENOUS

## 2013-07-08 ENCOUNTER — Other Ambulatory Visit: Payer: Self-pay | Admitting: Family Medicine

## 2013-07-12 ENCOUNTER — Ambulatory Visit (INDEPENDENT_AMBULATORY_CARE_PROVIDER_SITE_OTHER): Payer: 59 | Admitting: Surgery

## 2013-07-12 ENCOUNTER — Encounter (INDEPENDENT_AMBULATORY_CARE_PROVIDER_SITE_OTHER): Payer: Self-pay | Admitting: Surgery

## 2013-07-12 DIAGNOSIS — E210 Primary hyperparathyroidism: Secondary | ICD-10-CM

## 2013-07-12 NOTE — Patient Instructions (Signed)

## 2013-07-12 NOTE — Progress Notes (Signed)
General Surgery [facility]  Chief Complaint  Patient presents with  . New Evaluation    primary hyperparathyroidism - referral from Dr. 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 Patient was noted approximately one year ago with hypercalcemia. She was evaluated by varying physicians and then referred to Dr. Buddy Duty. Calcium level was elevated at 11.0. Intact PTH level is inappropriately high at 58. 24-hour urine collection was elevated at greater than 400. 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 Patient is now referred for consideration for parathyroidectomy.  Patient has had no prior head or neck surgery. There is no family history of parathyroid disease. There is no family history of other endocrine neoplasm.  Patient notes symptoms including fatigue, frequent urination, bone and joint pain, mental "fog", and irritability.  Past Medical History  Diagnosis Date  . Hypertension   . Chest pain   . Hyperlipidemia   . Rapid heart beat   . Chicken pox as achild  . Hoarseness 02/04/2012  . Ocular migraine   . GERD (gastroesophageal reflux disease)   . Preventative health care 02/04/2012  . Atypical chest pain 01/03/2011  . SUI (stress urinary incontinence, female) 02/04/2012    Sees Dr Perlie Gold and they have discussed a bladder tack but so far she declines  . Overactive bladder 05/31/2012  . Elevated LFTs 05/31/2012  . Obesity, unspecified 05/31/2012  . Allergic rhinitis 05/31/2012  . Cough 11/29/2012  . H/O tobacco use, presenting hazards to health 11/29/2012    Smoked 1 1/2 ppd for 20 years quit in roughtly 2007    Current Outpatient Prescriptions  Medication Sig Dispense Refill  . aspirin 81 MG tablet Take 81 mg by mouth daily.        . Cholecalciferol (VITAMIN D PO) Take 2,000 mg by mouth  daily. Vitamin D      . Coenzyme Q10 (CO Q 10) 100 MG CAPS Take 400 mg by mouth daily.      Marland Kitchen estradiol-norethindrone (ACTIVELLA) 1-0.5 MG per tablet Take 1 tablet by mouth daily.      . fluticasone (FLONASE) 50 MCG/ACT nasal spray Place 2 sprays into the nose daily as needed for rhinitis or allergies.  16 g  6  . furosemide (LASIX) 20 MG tablet Take 1 tablet (20 mg total) by mouth daily.  30 tablet  11  . losartan (COZAAR) 25 MG tablet TAKE 1 TABLET (25 MG TOTAL) BY MOUTH DAILY.  30 tablet  3  . omeprazole (PRILOSEC) 40 MG capsule Take 1 capsule (40 mg total) by mouth daily.  30 capsule  3  . OVER THE COUNTER MEDICATION Icool- daily      . OVER THE COUNTER MEDICATION Mega red- 1 capsule daily      . oxybutynin (DITROPAN-XL) 10 MG 24 hr tablet Take 10 mg by mouth daily.      . potassium chloride (K-DUR) 10 MEQ tablet Take 1 tablet (10 mEq total) by mouth daily.  10 tablet  0  . simvastatin (ZOCOR) 40 MG tablet TAKE 1 TABLET BY MOUTH EVERY DAY  30 tablet  2   No current facility-administered medications for this visit.    Allergies  Allergen Reactions  . Penicillins     Family History  Problem Relation Age of Onset  . Hypertension Mother 13    alive  . Renal cancer Mother  renal  . Hypertension Father 8    alive  . Atrial fibrillation Father   . Colon cancer Father     colon  . Hypertension Brother 61    alive  . Hypertension Sister   . Heart disease Paternal Aunt   . Heart disease Paternal Uncle   . Lung cancer Maternal Grandfather     lung/ smoker  . Emphysema Maternal Grandfather   . Heart disease Paternal Grandmother   . Other Paternal Grandfather     black lung  . Heart disease Paternal Grandfather   . Hypertension Sister   . COPD Sister     History   Social History  . Marital Status: Married    Spouse Name: N/A    Number of Children: 2  . Years of Education: N/A   Occupational History  . RN   .  [employer]   Social History Main Topics  .  Smoking status: Former Smoker -- 1.00 packs/day for 20 years    Types: Cigarettes    Quit date: 03/10/2005  . Smokeless tobacco: Never Used  . Alcohol Use: Yes     Comment: very rarely  . Drug Use: No  . Sexual Activity: Yes    Partners: Male   Other Topics Concern  . None   Social History Narrative  . None    REVIEW OF SYSTEMS - PERTINENT POSITIVES ONLY: Denies tremor. Denies palpitations. Denies palpable masses. Further symptoms as noted above.  EXAM: Filed Vitals:   07/12/13 1602  BP: 130/90  Pulse: 100  Temp: 97.2 F (36.2 C)  Resp: 20    GENERAL: well-developed, well-nourished, no acute distress HEENT: normocephalic; pupils equal and reactive; sclerae clear; dentition good; mucous membranes moist NECK:  No palpable masses in the thyroid bed; symmetric on extension; no palpable anterior or posterior cervical lymphadenopathy; no supraclavicular masses; no tenderness CHEST: clear to auscultation bilaterally without rales, rhonchi, or wheezes CARDIAC: regular rate and rhythm without significant murmur; peripheral pulses are full EXT:  non-tender without edema; no deformity NEURO: no gross focal deficits; no sign of tremor   LABORATORY RESULTS: See [facility] (CHL-Epic) for most recent results  RADIOLOGY RESULTS: See [facility] (CHL-Epic) for most recent results  IMPRESSION: Primary hyperparathyroidism, 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 We reviewed her study results. I provided her with written literature on primary hyperparathyroidism and its surgical management.  I have recommended right inferior minimally invasive parathyroidectomy as an outpatient surgical procedure. We discussed the procedure. We discussed potential complications including recurrent nerve injury, failure to identify the adenoma, need for 4 gland exploration, possibility of second gland adenoma, bleeding, infection. Patient  understands and wishes to proceed with surgery in the near future. We will make arrangements a time convenient for the patient.  The risks and benefits of the procedure have been discussed at length with the patient.  The patient understands the proposed procedure, potential alternative treatments, and the course of recovery to be expected.  All of the patient's questions have been answered at this time.  The patient wishes to proceed with surgery.  Earnstine Regal, MD, [facility]  Primary Care Physician: Penni Homans, MD

## 2013-07-27 LAB — HM MAMMOGRAPHY

## 2013-07-27 LAB — HM DEXA SCAN

## 2013-07-29 ENCOUNTER — Encounter (HOSPITAL_COMMUNITY): Payer: Self-pay | Admitting: Pharmacy Technician

## 2013-08-04 ENCOUNTER — Encounter (HOSPITAL_COMMUNITY)
Admission: RE | Admit: 2013-08-04 | Discharge: 2013-08-04 | Disposition: A | Discharge status: Home / Self Care | Payer: 59 | Source: Ambulatory Visit | Attending: Surgery | Admitting: Surgery

## 2013-08-04 ENCOUNTER — Encounter (INDEPENDENT_AMBULATORY_CARE_PROVIDER_SITE_OTHER): Payer: Self-pay

## 2013-08-04 ENCOUNTER — Encounter (HOSPITAL_COMMUNITY): Payer: Self-pay

## 2013-08-04 DIAGNOSIS — Z01812 Encounter for preprocedural laboratory examination: Secondary | ICD-10-CM | POA: Insufficient documentation

## 2013-08-04 DIAGNOSIS — Z01810 Encounter for preprocedural cardiovascular examination: Secondary | ICD-10-CM | POA: Insufficient documentation

## 2013-08-04 HISTORY — DX: Hyperparathyroidism, unspecified: E21.3

## 2013-08-04 LAB — BASIC METABOLIC PANEL
BUN: 13 mg/dL (ref 6–23)
CO2: 26 mEq/L (ref 19–32)
Calcium: 10.6 mg/dL — ABNORMAL HIGH (ref 8.4–10.5)
Chloride: 100 mEq/L (ref 96–112)
Creatinine, Ser: 0.57 mg/dL (ref 0.50–1.10)
GFR calc Af Amer: >90 mL/min (ref >90)
GFR calc non Af Amer: >90 mL/min (ref >90)
Glucose, Bld: 108 mg/dL — ABNORMAL HIGH (ref 70–99)
Potassium: 5 mEq/L (ref 3.7–5.3)
Sodium: 139 mEq/L (ref 137–147)

## 2013-08-04 LAB — CBC
HCT: 40.1 % (ref 36.0–46.0)
Hemoglobin: 13.4 g/dL (ref 12.0–15.0)
MCH: 29.3 pg (ref 26.0–34.0)
MCHC: 33.4 g/dL (ref 30.0–36.0)
MCV: 87.6 fL (ref 78.0–100.0)
Platelets: 253 10*3/uL (ref 150–400)
RBC: 4.58 MIL/uL (ref 3.87–5.11)
RDW: 13.6 % (ref 11.5–15.5)
WBC: 6.1 10*3/uL (ref 4.0–10.5)

## 2013-08-04 NOTE — Pre-Procedure Instructions (Signed)
CXR REPORT 11/29/12 IN EPIC. EKG WAS DONE TODAY - PREOP - AT [facility].

## 2013-08-04 NOTE — Progress Notes (Signed)
Quick Note:  EKG is acceptable for scheduled surgery.  Brand Siever M. Tarah Buboltz, MD, FACS [facility] Office: [phone]   ______ 

## 2013-08-04 NOTE — Patient Instructions (Addendum)
YOUR SURGERY IS SCHEDULED AT [facility]  ON:  Friday  6/5  REPORT TO  SHORT STAY CENTER AT:  5:30 AM   PLEASE COME IN THE [facility] ENTRANCE AND FOLLOW SIGNS TO SHORT STAY CENTER.  DO NOT EAT OR DRINK ANYTHING AFTER MIDNIGHT THE NIGHT BEFORE YOUR SURGERY.  YOU MAY BRUSH YOUR TEETH, RINSE OUT YOUR MOUTH--BUT NO WATER, NO FOOD, NO CHEWING GUM, NO MINTS, NO CANDIES, NO CHEWING TOBACCO.  PLEASE TAKE THE FOLLOWING MEDICATIONS THE AM OF YOUR SURGERY WITH A FEW SIPS OF WATER:    ESTRADIOL-NORETHINDRONE, PRILOSEC. OXYBUTYNIN  DO NOT BRING VALUABLES, MONEY, CREDIT CARDS.  DO NOT WEAR JEWELRY, MAKE-UP, NAIL POLISH AND NO METAL PINS OR CLIPS IN YOUR HAIR. CONTACT LENS, DENTURES / PARTIALS, GLASSES SHOULD NOT BE WORN TO SURGERY AND IN MOST CASES-HEARING AIDS WILL NEED TO BE REMOVED.  BRING YOUR GLASSES CASE, ANY EQUIPMENT NEEDED FOR YOUR CONTACT LENS. FOR PATIENTS ADMITTED TO THE HOSPITAL--CHECK OUT TIME THE DAY OF DISCHARGE IS 11:00 AM.  ALL INPATIENT ROOMS ARE PRIVATE - WITH BATHROOM, TELEPHONE, TELEVISION AND WIFI INTERNET.  IF YOU ARE BEING DISCHARGED THE SAME DAY OF YOUR SURGERY--YOU CAN NOT DRIVE YOURSELF HOME--AND SHOULD NOT GO HOME ALONE BY TAXI OR BUS.  NO DRIVING OR OPERATING MACHINERY, OR MAKING LEGAL DECISIONS FOR 24 HOURS FOLLOWING ANESTHESIA / PAIN MEDICATIONS.  PLEASE MAKE ARRANGEMENTS FOR SOMEONE TO BE WITH YOU AT HOME THE FIRST 24 HOURS AFTER SURGERY. RESPONSIBLE DRIVER'S NAME / PHONE                                                   PLEASE READ OVER ANY  FACT SHEETS THAT YOU WERE GIVEN: MRSA INFORMATION, BLOOD TRANSFUSION INFORMATION, INCENTIVE SPIROMETER INFORMATION.  PLEASE BE AWARE THAT YOU MAY NEED ADDITIONAL BLOOD DRAWN DAY OF YOUR SURGERY  _______________________________________________________________________   [facility] - Preparing for Surgery Before surgery, you can play an important role.  Because skin is not sterile, your skin needs to be as  free of germs as possible.  You can reduce the number of germs on your skin by washing with CHG (chlorahexidine gluconate) soap before surgery.  CHG is an antiseptic cleaner which kills germs and bonds with the skin to continue killing germs even after washing. Please DO NOT use if you have an allergy to CHG or antibacterial soaps.  If your skin becomes reddened/irritated stop using the CHG and inform your nurse when you arrive at Short Stay. Do not shave (including legs and underarms) for at least 48 hours prior to the first CHG shower.  You may shave your face/neck. Please follow these instructions carefully:  1.  Shower with CHG Soap the night before surgery and the  morning of Surgery.  2.  If you choose to wash your hair, wash your hair first as usual with your  normal  shampoo.  3.  After you shampoo, rinse your hair and body thoroughly to remove the  shampoo.                           4.  Use CHG as you would any other liquid soap.  You can apply chg directly  to the skin and wash  Gently with a scrungie or clean washcloth.  5.  Apply the CHG Soap to your body ONLY FROM THE NECK DOWN.   Do not use on face/ open                           Wound or open sores. Avoid contact with eyes, ears mouth and genitals (private parts).                       Wash face,  Genitals (private parts) with your normal soap.             6.  Wash thoroughly, paying special attention to the area where your surgery  will be performed.  7.  Thoroughly rinse your body with warm water from the neck down.  8.  DO NOT shower/wash with your normal soap after using and rinsing off  the CHG Soap.                9.  Pat yourself dry with a clean towel.            10.  Wear clean pajamas.            11.  Place clean sheets on your bed the night of your first shower and do not  sleep with pets. Day of Surgery : Do not apply any lotions/deodorants the morning of surgery.  Please wear clean clothes to the  hospital/surgery center.  FAILURE TO FOLLOW THESE INSTRUCTIONS MAY RESULT IN THE CANCELLATION OF YOUR SURGERY PATIENT SIGNATURE_________________________________  NURSE SIGNATURE__________________________________  ________________________________________________________________________

## 2013-08-04 NOTE — Progress Notes (Signed)
Quick Note:  These results are acceptable for scheduled surgery.  [name] M. [name], MD, FACS [facility] Office: [phone]   ______ 

## 2013-08-12 ENCOUNTER — Encounter (HOSPITAL_COMMUNITY): Payer: Self-pay

## 2013-08-12 ENCOUNTER — Ambulatory Visit (HOSPITAL_COMMUNITY): Payer: 59 | Admitting: Anesthesiology

## 2013-08-12 ENCOUNTER — Ambulatory Visit (HOSPITAL_COMMUNITY)
Admission: RE | Admit: 2013-08-12 | Discharge: 2013-08-12 | Disposition: A | Discharge status: Home / Self Care | Payer: 59 | Source: Ambulatory Visit | Attending: Surgery | Admitting: Surgery

## 2013-08-12 ENCOUNTER — Encounter (HOSPITAL_COMMUNITY): Payer: 59 | Admitting: Anesthesiology

## 2013-08-12 ENCOUNTER — Encounter (HOSPITAL_COMMUNITY)
Admission: RE | Disposition: A | Discharge status: Home / Self Care | Payer: Self-pay | Source: Ambulatory Visit | Attending: Surgery

## 2013-08-12 DIAGNOSIS — I10 Essential (primary) hypertension: Secondary | ICD-10-CM | POA: Insufficient documentation

## 2013-08-12 DIAGNOSIS — E669 Obesity, unspecified: Secondary | ICD-10-CM | POA: Insufficient documentation

## 2013-08-12 DIAGNOSIS — D351 Benign neoplasm of parathyroid gland: Secondary | ICD-10-CM

## 2013-08-12 DIAGNOSIS — K219 Gastro-esophageal reflux disease without esophagitis: Secondary | ICD-10-CM | POA: Insufficient documentation

## 2013-08-12 DIAGNOSIS — E210 Primary hyperparathyroidism: Secondary | ICD-10-CM | POA: Diagnosis present

## 2013-08-12 DIAGNOSIS — Z7982 Long term (current) use of aspirin: Secondary | ICD-10-CM | POA: Insufficient documentation

## 2013-08-12 DIAGNOSIS — J309 Allergic rhinitis, unspecified: Secondary | ICD-10-CM | POA: Insufficient documentation

## 2013-08-12 DIAGNOSIS — Z79899 Other long term (current) drug therapy: Secondary | ICD-10-CM | POA: Insufficient documentation

## 2013-08-12 DIAGNOSIS — E785 Hyperlipidemia, unspecified: Secondary | ICD-10-CM | POA: Insufficient documentation

## 2013-08-12 DIAGNOSIS — Z87891 Personal history of nicotine dependence: Secondary | ICD-10-CM | POA: Insufficient documentation

## 2013-08-12 HISTORY — PX: PARATHYROIDECTOMY: SHX19

## 2013-08-12 SURGERY — PARATHYROIDECTOMY
Anesthesia: General | Site: Neck | Laterality: Right

## 2013-08-12 MED ORDER — FENTANYL CITRATE 0.05 MG/ML IJ SOLN
INTRAMUSCULAR | Status: DC | PRN
  Administered 2013-08-12 (×2): 50 ug via INTRAVENOUS

## 2013-08-12 MED ORDER — 0.9 % SODIUM CHLORIDE (POUR BTL) OPTIME
TOPICAL | Status: DC | PRN
  Administered 2013-08-12: 1000 mL

## 2013-08-12 MED ORDER — HYDROMORPHONE HCL PF 1 MG/ML IJ SOLN
0.2500 mg | INTRAMUSCULAR | Status: DC | PRN
Start: 2013-08-12 — End: 2013-08-12
  Administered 2013-08-12 (×2): 0.5 mg via INTRAVENOUS

## 2013-08-12 MED ORDER — PROPOFOL 10 MG/ML IV BOLUS
INTRAVENOUS | Status: AC
  Filled 2013-08-12: qty 20

## 2013-08-12 MED ORDER — BUPIVACAINE HCL 0.25 % IJ SOLN
INTRAMUSCULAR | Status: DC | PRN
  Administered 2013-08-12: 10 mL

## 2013-08-12 MED ORDER — PROMETHAZINE HCL 25 MG/ML IJ SOLN: 6.2500 mg | INTRAMUSCULAR | Status: DC | PRN

## 2013-08-12 MED ORDER — PROPOFOL 10 MG/ML IV BOLUS
INTRAVENOUS | Status: DC | PRN
  Administered 2013-08-12: 180 mg via INTRAVENOUS

## 2013-08-12 MED ORDER — ROCURONIUM BROMIDE 100 MG/10ML IV SOLN
INTRAVENOUS | Status: DC | PRN
  Administered 2013-08-12: 10 mg via INTRAVENOUS
  Administered 2013-08-12: 20 mg via INTRAVENOUS

## 2013-08-12 MED ORDER — LIDOCAINE HCL (CARDIAC) 20 MG/ML IV SOLN
INTRAVENOUS | Status: AC
Start: 2013-08-12 — End: 2013-08-12
  Filled 2013-08-12: qty 5

## 2013-08-12 MED ORDER — NEOSTIGMINE METHYLSULFATE 10 MG/10ML IV SOLN
INTRAVENOUS | Status: DC | PRN
  Administered 2013-08-12: 5 mg via INTRAVENOUS

## 2013-08-12 MED ORDER — HYDROCODONE-ACETAMINOPHEN 5-325 MG PO TABS: 1.0000 | ORAL_TABLET | ORAL | Status: DC | PRN

## 2013-08-12 MED ORDER — CIPROFLOXACIN IN D5W 400 MG/200ML IV SOLN
400.0000 mg | INTRAVENOUS | Status: AC
  Administered 2013-08-12: 400 mg via INTRAVENOUS

## 2013-08-12 MED ORDER — PHENYLEPHRINE 40 MCG/ML (10ML) SYRINGE FOR IV PUSH (FOR BLOOD PRESSURE SUPPORT)
PREFILLED_SYRINGE | INTRAVENOUS | Status: AC
  Filled 2013-08-12: qty 10

## 2013-08-12 MED ORDER — DEXAMETHASONE SODIUM PHOSPHATE 10 MG/ML IJ SOLN
INTRAMUSCULAR | Status: DC | PRN
  Administered 2013-08-12: 10 mg via INTRAVENOUS

## 2013-08-12 MED ORDER — LIDOCAINE HCL (CARDIAC) 20 MG/ML IV SOLN
INTRAVENOUS | Status: DC | PRN
  Administered 2013-08-12: 100 mg via INTRAVENOUS

## 2013-08-12 MED ORDER — BUPIVACAINE HCL (PF) 0.25 % IJ SOLN
INTRAMUSCULAR | Status: AC
  Filled 2013-08-12: qty 30

## 2013-08-12 MED ORDER — FENTANYL CITRATE 0.05 MG/ML IJ SOLN
INTRAMUSCULAR | Status: AC
  Filled 2013-08-12: qty 5

## 2013-08-12 MED ORDER — MIDAZOLAM HCL 5 MG/5ML IJ SOLN
INTRAMUSCULAR | Status: DC | PRN
  Administered 2013-08-12: 2 mg via INTRAVENOUS

## 2013-08-12 MED ORDER — MIDAZOLAM HCL 2 MG/2ML IJ SOLN
INTRAMUSCULAR | Status: AC
  Filled 2013-08-12: qty 2

## 2013-08-12 MED ORDER — HYDROCODONE-ACETAMINOPHEN 5-325 MG PO TABS
1.0000 | ORAL_TABLET | Freq: Once | ORAL | Status: AC
  Administered 2013-08-12: 1 via ORAL
  Filled 2013-08-12: qty 1

## 2013-08-12 MED ORDER — CIPROFLOXACIN IN D5W 400 MG/200ML IV SOLN
INTRAVENOUS | Status: AC
  Filled 2013-08-12: qty 200

## 2013-08-12 MED ORDER — OXYCODONE HCL 5 MG PO TABS: 5.0000 mg | ORAL_TABLET | Freq: Once | ORAL | Status: DC | PRN

## 2013-08-12 MED ORDER — PHENYLEPHRINE HCL 10 MG/ML IJ SOLN
INTRAMUSCULAR | Status: DC | PRN
  Administered 2013-08-12: 80 ug via INTRAVENOUS

## 2013-08-12 MED ORDER — NEOSTIGMINE METHYLSULFATE 10 MG/10ML IV SOLN
INTRAVENOUS | Status: AC
  Filled 2013-08-12: qty 1

## 2013-08-12 MED ORDER — GLYCOPYRROLATE 0.2 MG/ML IJ SOLN
INTRAMUSCULAR | Status: AC
  Filled 2013-08-12: qty 3

## 2013-08-12 MED ORDER — LACTATED RINGERS IV SOLN
INTRAVENOUS | Status: DC | PRN
  Administered 2013-08-12 (×2): via INTRAVENOUS

## 2013-08-12 MED ORDER — MEPERIDINE HCL 50 MG/ML IJ SOLN: 6.2500 mg | INTRAMUSCULAR | Status: DC | PRN

## 2013-08-12 MED ORDER — ONDANSETRON HCL 4 MG/2ML IJ SOLN
INTRAMUSCULAR | Status: AC
  Filled 2013-08-12: qty 2

## 2013-08-12 MED ORDER — ONDANSETRON HCL 4 MG/2ML IJ SOLN
INTRAMUSCULAR | Status: DC | PRN
  Administered 2013-08-12: 4 mg via INTRAVENOUS

## 2013-08-12 MED ORDER — SUCCINYLCHOLINE CHLORIDE 20 MG/ML IJ SOLN
INTRAMUSCULAR | Status: DC | PRN
  Administered 2013-08-12: 100 mg via INTRAVENOUS

## 2013-08-12 MED ORDER — ROCURONIUM BROMIDE 100 MG/10ML IV SOLN
INTRAVENOUS | Status: AC
  Filled 2013-08-12: qty 1

## 2013-08-12 MED ORDER — OXYCODONE HCL 5 MG/5ML PO SOLN
5.0000 mg | Freq: Once | ORAL | Status: DC | PRN
  Filled 2013-08-12: qty 5

## 2013-08-12 MED ORDER — HYDROMORPHONE HCL PF 1 MG/ML IJ SOLN
INTRAMUSCULAR | Status: AC
  Filled 2013-08-12: qty 1

## 2013-08-12 MED ORDER — GLYCOPYRROLATE 0.2 MG/ML IJ SOLN
INTRAMUSCULAR | Status: DC | PRN
  Administered 2013-08-12: 0.6 mg via INTRAVENOUS

## 2013-08-12 MED ORDER — DEXAMETHASONE SODIUM PHOSPHATE 10 MG/ML IJ SOLN
INTRAMUSCULAR | Status: AC
  Filled 2013-08-12: qty 1

## 2013-08-12 SURGICAL SUPPLY — 42 items
APL SKNCLS STERI-STRIP NONHPOA (GAUZE/BANDAGES/DRESSINGS) ×1
ATTRACTOMAT 16X20 MAGNETIC DRP (DRAPES) ×2 IMPLANT
BENZOIN TINCTURE PRP APPL 2/3 (GAUZE/BANDAGES/DRESSINGS) ×2 IMPLANT
BLADE HEX COATED 2.75 (ELECTRODE) ×2 IMPLANT
BLADE SURG 15 STRL LF DISP TIS (BLADE) ×1 IMPLANT
BLADE SURG 15 STRL SS (BLADE) ×2
CANISTER SUCTION 2500CC (MISCELLANEOUS) ×1 IMPLANT
CHLORAPREP W/TINT 10.5 ML (MISCELLANEOUS) ×2 IMPLANT
CLIP TI MEDIUM 6 (CLIP) ×4 IMPLANT
CLIP TI WIDE RED SMALL 6 (CLIP) ×4 IMPLANT
DRAPE PED LAPAROTOMY (DRAPES) ×2 IMPLANT
DRESSING SURGICEL FIBRLLR 1X2 (HEMOSTASIS) ×1 IMPLANT
DRSG SURGICEL FIBRILLAR 1X2 (HEMOSTASIS) ×2
ELECT REM PT RETURN 9FT ADLT (ELECTROSURGICAL) ×2
ELECTRODE REM PT RTRN 9FT ADLT (ELECTROSURGICAL) ×1 IMPLANT
GAUZE SPONGE 4X4 16PLY XRAY LF (GAUZE/BANDAGES/DRESSINGS) ×2 IMPLANT
GLOVE BIO SURGEON STRL SZ8 (GLOVE) ×1 IMPLANT
GLOVE BIOGEL PI IND STRL 7.5 (GLOVE) IMPLANT
GLOVE BIOGEL PI IND STRL 8.5 (GLOVE) IMPLANT
GLOVE BIOGEL PI INDICATOR 7.5 (GLOVE) ×1
GLOVE BIOGEL PI INDICATOR 8.5 (GLOVE) ×1
GLOVE SURG ORTHO 8.0 STRL STRW (GLOVE) ×2 IMPLANT
GOWN STRL REUS W/TWL XL LVL3 (GOWN DISPOSABLE) ×6 IMPLANT
KIT BASIN OR (CUSTOM PROCEDURE TRAY) ×2 IMPLANT
NDL HYPO 25X1 1.5 SAFETY (NEEDLE) ×1 IMPLANT
NEEDLE HYPO 25X1 1.5 SAFETY (NEEDLE) ×2 IMPLANT
NS IRRIG 1000ML POUR BTL (IV SOLUTION) ×2 IMPLANT
PACK BASIC VI WITH GOWN DISP (CUSTOM PROCEDURE TRAY) ×2 IMPLANT
PENCIL BUTTON HOLSTER BLD 10FT (ELECTRODE) ×2 IMPLANT
STAPLER VISISTAT 35W (STAPLE) ×2 IMPLANT
STRIP CLOSURE SKIN 1/2X4 (GAUZE/BANDAGES/DRESSINGS) ×2 IMPLANT
SUT MNCRL AB 4-0 PS2 18 (SUTURE) ×2 IMPLANT
SUT SILK 2 0 (SUTURE)
SUT SILK 2-0 18XBRD TIE 12 (SUTURE) IMPLANT
SUT SILK 3 0 (SUTURE)
SUT SILK 3-0 18XBRD TIE 12 (SUTURE) IMPLANT
SUT VIC AB 3-0 SH 18 (SUTURE) ×2 IMPLANT
SYR BULB IRRIGATION 50ML (SYRINGE) ×2 IMPLANT
SYR CONTROL 10ML LL (SYRINGE) ×2 IMPLANT
TOWEL OR 17X26 10 PK STRL BLUE (TOWEL DISPOSABLE) ×2 IMPLANT
TOWEL OR NON WOVEN STRL DISP B (DISPOSABLE) ×2 IMPLANT
YANKAUER SUCT BULB TIP 10FT TU (MISCELLANEOUS) ×2 IMPLANT

## 2013-08-12 NOTE — Anesthesia Preprocedure Evaluation (Addendum)
Anesthesia Evaluation  Patient identified by MRN, date of birth, ID band Patient awake    Reviewed: Allergy & Precautions, H&P , NPO status , Patient's Chart, lab work & pertinent test results  Airway Mallampati: II TM Distance: >3 FB Neck ROM: Full    Dental  (+) Dental Advisory Given   Pulmonary neg pulmonary ROS, shortness of breath, former smoker,  breath sounds clear to auscultation        Cardiovascular hypertension, Pt. on medications Rhythm:Regular Rate:Normal  Echo 01/13/13 - Left ventricle: The cavity size was normal. Wall thickness was normal. The estimated ejection fraction was 65%. Wall  motion was normal; there were no regional wall motion  abnormalities. - Right ventricle: The cavity size was normal. Systolic   function was normal. - Pulmonary arteries: PA peak pressure: 54mm Hg (S). - Impressions: There is no obvious shunt by color flow. The  bubble study shows no evidence of shunting of bubbles.  Impressions: - There is no obvious shunt by color flow. The bubble study  shows no evidence of shunting of bubbles.    Stress Echo 01/15/2013 - LV global systolic function was normal. - Normal wall motion; no LV regional wall motion   abnormalities.  Peak stress: - LV global systolic function was hyperdynamic. - No evidence for new LV regional wall motion abnormalities.    Neuro/Psych  Headaches, negative psych ROS   GI/Hepatic Neg liver ROS, GERD-  Medicated,  Endo/Other  negative endocrine ROS  Renal/GU negative Renal ROS     Musculoskeletal negative musculoskeletal ROS (+)   Abdominal   Peds  Hematology negative hematology ROS (+)   Anesthesia Other Findings   Reproductive/Obstetrics negative OB ROS                        Anesthesia Physical Anesthesia Plan  ASA: II  Anesthesia Plan: General   Post-op Pain Management:    Induction: Intravenous  Airway Management  Planned: Oral ETT  Additional Equipment:   Intra-op Plan:   Post-operative Plan: Extubation in OR  Informed Consent: I have reviewed the patients History and Physical, chart, labs and discussed the procedure including the risks, benefits and alternatives for the proposed anesthesia with the patient or authorized representative who has indicated his/her understanding and acceptance.   Dental advisory given  Plan Discussed with: CRNA  Anesthesia Plan Comments:         Anesthesia Quick Evaluation

## 2013-08-12 NOTE — Anesthesia Postprocedure Evaluation (Signed)
Anesthesia Post Note  Patient: Sandra [name]  Procedure(s) Performed: Procedure(s) (LRB): right inferior frozen section PARATHYROIDECTOMY (Right)  Anesthesia type: General  Patient location: PACU  Post pain: Pain level controlled  Post assessment: Post-op Vital signs reviewed  Last Vitals: BP 110/61  Pulse 84  Temp(Src) 36.6 C (Oral)  Resp 16  SpO2 94%  LMP 04/01/2011  Post vital signs: Reviewed  Level of consciousness: sedated  Complications: No apparent anesthesia complications

## 2013-08-12 NOTE — Op Note (Signed)
OPERATIVE REPORT - PARATHYROIDECTOMY  Preoperative diagnosis: Primary hyperparathyroidism  Postop diagnosis: Same  Procedure: Right inferior minimally invasive parathyroidectomy  Surgeon:  Earnstine Regal, MD, FACS  Anesthesia: Gen. 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 Patient was noted approximately one year ago with hypercalcemia. She was evaluated by varying physicians and then referred to Dr. Buddy Duty. Calcium level was elevated at 11.0. Intact PTH level is inappropriately high at 58. 24-hour urine collection was elevated at greater than 400. 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 Patient is now referred for consideration for parathyroidectomy. Patient has had no prior head or neck surgery. There is no family history of parathyroid disease. There is no family history of other endocrine neoplasm. Patient notes symptoms including fatigue, frequent urination, bone and joint pain, mental "fog", and irritability.  Procedure: Patient was prepared in the holding area. He was brought to operating room and placed in a supine position on the operating room table. Following administration of general anesthesia, the patient was positioned and then prepped and draped in the usual strict aseptic fashion. After ascertaining that an adequate level of anesthesia been achieved, a neck incision was made with a #15 blade. Dissection was carried through subcutaneous tissues and platysma. Hemostasis was obtained with the electrocautery. Skin flaps were developed circumferentially and a Weitlander retractor was placed for exposure.  Strap muscles were incised in the midline. Strap muscles were reflected exposing the thyroid lobe. With gentle blunt dissection the thyroid lobe was  mobilized.  Dissection was carried through adipose tissue and an enlarged parathyroid gland was identified. It was gently mobilized. Vascular structures were divided between small and medium ligaclips. Care was taken to avoid the recurrent laryngeal nerve and the esophagus. The parathyroid gland was completely excised. It was submitted to pathology where frozen section confirmed parathyroid tissue consistent with adenoma.  Neck was irrigated with warm saline and good hemostasis was noted. Fibrillar was placed in the operative field. Strap muscles were reapproximated in the midline with interrupted 3-0 Vicryl sutures. Platysma was closed with interrupted 3-0 Vicryl sutures. Skin was closed with a running 4-0 Monocryl subcuticular suture. Marcaine was infiltrated circumferentially. Wound was washed and dried and benzoin and Steri-Strips were applied. Sterile gauze dressings were applied. Patient was awakened from anesthesia and brought to the recovery room. The patient tolerated the procedure well.   Earnstine Regal, MD, [facility]

## 2013-08-12 NOTE — Transfer of Care (Signed)
Immediate Anesthesia Transfer of Care Note  Patient: Sandra [name]  Procedure(s) Performed: Procedure(s): right inferior frozen section PARATHYROIDECTOMY (Right)  Patient Location: PACU  Anesthesia Type:General  Level of Consciousness: sedated  Airway & Oxygen Therapy: Patient Spontanous Breathing and Patient connected to face mask oxygen  Post-op Assessment: Report given to PACU RN and Post -op Vital signs reviewed and stable  Post vital signs: Reviewed and stable  Complications: No apparent anesthesia complications

## 2013-08-12 NOTE — H&P (Signed)
General Surgery [facility]  Chief Complaint   Patient presents with   .  New Evaluation       primary hyperparathyroidism - referral from Dr. 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 Patient was noted approximately one year ago with hypercalcemia. She was evaluated by varying physicians and then referred to Dr. Buddy Duty. Calcium level was elevated at 11.0. Intact PTH level is inappropriately high at 58. 24-hour urine collection was elevated at greater than 400. 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 Patient is now referred for consideration for parathyroidectomy.  Patient has had no prior head or neck surgery. There is no family history of parathyroid disease. There is no family history of other endocrine neoplasm.  Patient notes symptoms including fatigue, frequent urination, bone and joint pain, mental "fog", and irritability.    Past Medical History   Diagnosis  Date   .  Hypertension     .  Chest pain     .  Hyperlipidemia     .  Rapid heart beat     .  Chicken pox  as achild   .  Hoarseness  02/04/2012   .  Ocular migraine     .  GERD (gastroesophageal reflux disease)     .  Preventative health care  02/04/2012   .  Atypical chest pain  01/03/2011   .  SUI (stress urinary incontinence, female)  02/04/2012       Sees Dr Perlie Gold and they have discussed a bladder tack but so far she declines   .  Overactive bladder  05/31/2012   .  Elevated LFTs  05/31/2012   .  Obesity, unspecified  05/31/2012   .  Allergic rhinitis  05/31/2012   .  Cough  11/29/2012   .  H/O tobacco use, presenting hazards to health  11/29/2012       Smoked 1 1/2 ppd for 20 years quit in roughtly 2007    Current Outpatient Prescriptions   Medication  Sig  Dispense  Refill   .  aspirin 81 MG tablet  Take 81 mg by  mouth daily.           .  Cholecalciferol (VITAMIN D PO)  Take 2,000 mg by mouth daily. Vitamin D         .  Coenzyme Q10 (CO Q 10) 100 MG CAPS  Take 400 mg by mouth daily.         Marland Kitchen  estradiol-norethindrone (ACTIVELLA) 1-0.5 MG per tablet  Take 1 tablet by mouth daily.         .  fluticasone (FLONASE) 50 MCG/ACT nasal spray  Place 2 sprays into the nose daily as needed for rhinitis or allergies.   16 g   6   .  furosemide (LASIX) 20 MG tablet  Take 1 tablet (20 mg total) by mouth daily.   30 tablet   11   .  losartan (COZAAR) 25 MG tablet  TAKE 1 TABLET (25 MG TOTAL) BY MOUTH DAILY.   30 tablet   3   .  omeprazole (PRILOSEC) 40 MG capsule  Take 1 capsule (40 mg total) by mouth daily.   30 capsule   3   .  OVER THE COUNTER MEDICATION  Icool- daily         .  OVER THE COUNTER MEDICATION  Mega red- 1 capsule daily         .  oxybutynin (DITROPAN-XL) 10 MG 24 hr tablet  Take 10 mg by mouth daily.         .  potassium chloride (K-DUR) 10 MEQ tablet  Take 1 tablet (10 mEq total) by mouth daily.   10 tablet   0   .  simvastatin (ZOCOR) 40 MG tablet  TAKE 1 TABLET BY MOUTH EVERY DAY   30 tablet   2    No current facility-administered medications for this visit.    Allergies   Allergen  Reactions   .  Penicillins      Family History   Problem  Relation  Age of Onset   .  Hypertension  Mother  46       alive   .  Renal cancer  Mother         renal   .  Hypertension  Father  46       alive   .  Atrial fibrillation  Father     .  Colon cancer  Father         colon   .  Hypertension  Brother  13       alive   .  Hypertension  Sister     .  Heart disease  Paternal Aunt     .  Heart disease  Paternal Uncle     .  Lung cancer  Maternal Grandfather         lung/ smoker   .  Emphysema  Maternal Grandfather     .  Heart disease  Paternal Grandmother     .  Other  Paternal Grandfather         black lung   .  Heart disease  Paternal Grandfather     .  Hypertension  Sister     .  COPD  Sister          Social History   .  Marital Status:  Married       Spouse Name:  N/A       Number of Children:  2   .  Years of Education:  N/A    Occupational History   .  RN     .    [employer]    Social History Main Topics   .  Smoking status:  Former Smoker -- 1.00 packs/day for 20 years       Types:  Cigarettes       Quit date:  03/10/2005   .  Smokeless tobacco:  Never Used   .  Alcohol Use:  Yes         Comment: very rarely   .  Drug Use:  No   .  Sexual Activity:  Yes       Partners:  Male    Other Topics  Concern   .  None    Social History Narrative   .  None    REVIEW OF SYSTEMS - PERTINENT POSITIVES ONLY: Denies tremor. Denies palpitations. Denies palpable masses. Further symptoms as noted above.  EXAM: Filed Vitals:     07/12/13 1602   BP:  130/90   Pulse:  100   Temp:  97.2 F (36.2 C)   Resp:  20    GENERAL:      well-developed, well-nourished, no acute distress HEENT:  normocephalic; pupils equal and reactive; sclerae clear; dentition good; mucous membranes moist NECK:             No palpable masses in the thyroid bed; symmetric on extension; no palpable anterior or posterior cervical lymphadenopathy; no supraclavicular masses; no tenderness CHEST:           clear to auscultation bilaterally without rales, rhonchi, or wheezes CARDIAC:       regular rate and rhythm without significant murmur; peripheral pulses are full EXT:                non-tender without edema; no deformity NEURO:          no gross focal deficits; no sign of tremor  LABORATORY RESULTS: See [facility] (CHL-Epic) for most recent results  RADIOLOGY RESULTS: See [facility] (CHL-Epic) for most recent results  IMPRESSION: Primary hyperparathyroidism, 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 We reviewed her study results. I provided her with written literature on primary hyperparathyroidism and its surgical  management.   I have recommended right inferior minimally invasive parathyroidectomy as an outpatient surgical procedure. We discussed the procedure. We discussed potential complications including recurrent nerve injury, failure to identify the adenoma, need for 4 gland exploration, possibility of second gland adenoma, bleeding, infection. Patient understands and wishes to proceed with surgery in the near future. We will make arrangements a time convenient for the patient.   The risks and benefits of the procedure have been discussed at length with the patient.  The patient understands the proposed procedure, potential alternative treatments, and the course of recovery to be expected.  All of the patient's questions have been answered at this time.  The patient wishes to proceed with surgery.  Earnstine Regal, MD, [facility]  Primary Care Physician: Penni Homans, MD

## 2013-08-15 ENCOUNTER — Encounter (HOSPITAL_COMMUNITY): Payer: Self-pay | Admitting: Surgery

## 2013-08-17 ENCOUNTER — Telehealth (INDEPENDENT_AMBULATORY_CARE_PROVIDER_SITE_OTHER): Payer: Self-pay

## 2013-08-17 ENCOUNTER — Other Ambulatory Visit (INDEPENDENT_AMBULATORY_CARE_PROVIDER_SITE_OTHER): Payer: Self-pay

## 2013-08-17 DIAGNOSIS — E213 Hyperparathyroidism, unspecified: Secondary | ICD-10-CM

## 2013-08-17 NOTE — Telephone Encounter (Signed)
Pt home doing well. Pt advised of appt and need to have calcium drawn prior to ov. Pt states she is going out of town and will not be able to have labs drawn until the morning of ov. Lab slip mailed to pt and she is advised she will need to call in a few days after labs to get result since she will be traveling. Pt states she understands.

## 2013-08-26 ENCOUNTER — Encounter (INDEPENDENT_AMBULATORY_CARE_PROVIDER_SITE_OTHER): Payer: Self-pay | Admitting: Surgery

## 2013-08-26 ENCOUNTER — Ambulatory Visit (INDEPENDENT_AMBULATORY_CARE_PROVIDER_SITE_OTHER): Payer: 59 | Admitting: Surgery

## 2013-08-26 VITALS — BP 160/100 | HR 106 | Temp 97.8°F | Resp 20 | Ht 65.0 in | Wt 204.8 lb

## 2013-08-26 DIAGNOSIS — E210 Primary hyperparathyroidism: Secondary | ICD-10-CM

## 2013-08-26 NOTE — Progress Notes (Signed)
General Surgery [facility]  Chief Complaint  Patient presents with  . Routine Post Op    parathyroidectomy 08/12/2013    HISTORY: Patient is a 53 year old female who underwent minimally invasive parathyroidectomy on June 50,015. Final pathology shows an enlarged parathyroid gland measuring 1.9 cm in greatest dimension and weighing 1.5 g. Postoperative calcium level is pending this morning.  EXAM: Surgical incision is healing nicely. Remaining Steri-Strips are removed. Minimal soft tissue swelling. Voice quality is normal.  IMPRESSION: Status post minimally invasive parathyroidectomy  PLAN: Patient already notes the bone and joint pain has improved. Frequent urination has also improved.  Patient will begin applying topical creams to her incision.  Patient will return for wound check in 6 weeks. We will check an intact PTH level and serum calcium level prior to that office visit.  Earnstine Regal, MD, [facility]   Visit Diagnoses: 1. Hyperparathyroidism, primary   2. Hypercalcemia

## 2013-08-26 NOTE — Patient Instructions (Signed)
  CARE OF INCISION   Apply cocoa butter/vitamin E cream (Palmer's brand) to your incision 2 - 3 times daily.  Massage cream into incision for one minute with each application.  Use sunscreen (50 SPF or higher) for first 6 months after surgery if area is exposed to sun.  You may alternate Mederma or other scar reducing cream with cocoa butter cream if desired.       Todd M. Gerkin, MD, FACS      [facility]      Office: [phone]    

## 2013-08-27 LAB — CALCIUM: Calcium: 9.4 mg/dL (ref 8.4–10.5)

## 2013-08-30 ENCOUNTER — Telehealth (INDEPENDENT_AMBULATORY_CARE_PROVIDER_SITE_OTHER): Payer: Self-pay

## 2013-08-30 NOTE — Telephone Encounter (Signed)
Message copied by Dois Davenport on Tue Aug 30, 2013  9:56 AM ------      Message from: Earnstine Regal      Created: Tue Aug 30, 2013  9:45 AM       Jenny Reichmann,            Let her know that calcium level was normal!            tmg                  ----- Message -----         From: [facility] in [facility] Interface         Sent: 08/27/2013   1:20 AM           To: Earnstine Regal, MD                   ------

## 2013-08-30 NOTE — Telephone Encounter (Signed)
LMOM for pt to call. Per Dr Harlow Asa attached lab msg pt can be advised Calcium normal.

## 2013-08-31 ENCOUNTER — Telehealth (INDEPENDENT_AMBULATORY_CARE_PROVIDER_SITE_OTHER): Payer: Self-pay

## 2013-08-31 NOTE — Telephone Encounter (Signed)
Patient called back and was given below calcium level. She wanted me to let Dr Harlow Asa know she is working in [employer] and fell. She broke her radial head. Advised I would pass the msg along.

## 2013-09-11 ENCOUNTER — Other Ambulatory Visit: Payer: Self-pay | Admitting: Family Medicine

## 2013-09-12 NOTE — Telephone Encounter (Signed)
Please call and inform pt that we sent in the refill request with a refill but she will need to schedule an appt by 11-29-13 to keep getting refills

## 2013-09-12 NOTE — Telephone Encounter (Signed)
Informed patient of medication refill and she scheduled appointment for 11/01/13

## 2013-09-21 ENCOUNTER — Encounter: Payer: Self-pay | Admitting: Medical

## 2013-09-21 ENCOUNTER — Ambulatory Visit (INDEPENDENT_AMBULATORY_CARE_PROVIDER_SITE_OTHER): Payer: 59 | Admitting: Medical

## 2013-09-21 VITALS — BP 150/90 | HR 109 | Temp 98.9°F | Ht 65.0 in | Wt 198.4 lb

## 2013-09-21 DIAGNOSIS — R197 Diarrhea, unspecified: Secondary | ICD-10-CM

## 2013-09-21 MED ORDER — CIPROFLOXACIN HCL 500 MG PO TABS: 500.0000 mg | ORAL_TABLET | Freq: Two times a day (BID) | ORAL | Status: DC

## 2013-09-21 NOTE — Progress Notes (Signed)
   Subjective:    Patient ID: Sandra [name], female    DOB: 1960/11/18, 53 y.o.   MRN: [id number]  HPI  Pt states on Saturday had diarrhea. Had several times. 8 time first day. Sunday some less but still some. Monday less than on Sunday. Tuesday none but then reocccurence today again. About 8 times today. Today some ginger tea and water. Yesterday drank fluids and had banana and yogurt. Pt husband family did visit recently from the [place]. His daughter had stomach pain and diarrhea for 5 days. Pt states she has been vaccinated for hepatitis A. Up to date on all vaccines.  Pt bp recheck was 150/90. Did not take med. Pulse recheck 109.(She stats when pulse was higher she was having abdominal pain) O2 sat recheck 97.  Note she is reporting more upper abdominal cramping and not llq type pain.     Review of Systems  Constitutional: Negative for fever, chills and fatigue.  Respiratory: Negative for chest tightness and shortness of breath.   Cardiovascular: Negative for chest pain and palpitations.  Gastrointestinal: Positive for abdominal pain and diarrhea. Negative for nausea, vomiting, constipation, blood in stool, abdominal distention, anal bleeding and rectal pain.       Intermittent varying in intensity abdominal cramps. Not much by time I examined.  Endocrine: Negative for polydipsia, polyphagia and polyuria.  Genitourinary: Negative.   Musculoskeletal: Negative.   Skin: Negative for color change and pallor.       Objective:   Physical Exam General Appearance- Not in acute distress.  HEENT Eyes- Scleraeral/Conjuntiva-bilat- Not Yellow. Mouth & Throat- Normal.  Chest and Lung Exam Auscultation: Breath sounds:-Normal. Adventitious sounds:- No Adventitious sounds.  Cardiovascular Auscultation:Rythm - Regular. Heart Sounds -Normal heart sounds.  Abdomen Inspection:-Inspection Normal.  Palpation/Perucssion: Palpation and Percussion of the abdomen reveal- Non Tender,  No Rebound tenderness, No rigidity(Guarding) and No Palpable abdominal masses.  Liver:-Normal.  Spleen:- Normal.   Skin- no tenting of skin. Feel moist.        Assessment & Plan:

## 2013-09-21 NOTE — Assessment & Plan Note (Addendum)
Hydrate propel. Bland foods. Immodium otc. Stool culture and c.dificile studies. Pt did have  contact with person from [place]. Pt states that she is up  to date on vaccines particularly states had hep A vaccine series. Pt is a Marine scientist. If not better or if worsens before leaving for [place] then start cipro. If by time gets back from [place] if she is still symptomatic and stool studies neg then consider labs. Advised pt to turn in  Stool studies sooner the better.(Defintitley before leaving town.)

## 2013-09-21 NOTE — Patient Instructions (Signed)
Rest hydrate, bland foods. Advised propel hydration. Immodium otc. If diarrhea persists same degree by Friday then start cipro.(Since going out of town to [place]). Be seen in [place] pending stool study results if worsens. Follow up with [place] on return if any symptoms persist.

## 2013-09-21 NOTE — Progress Notes (Signed)
Pre visit review using our clinic review tool, if applicable. No additional management support is needed unless otherwise documented below in the visit note. 

## 2013-09-22 ENCOUNTER — Telehealth: Payer: Self-pay | Admitting: Family Medicine

## 2013-09-22 NOTE — Telephone Encounter (Signed)
Please advise 

## 2013-09-22 NOTE — Telephone Encounter (Signed)
Patient left message requesting a call back to know if she starts her antibiotics today if she will be OK to go to her family reunion on Saturday without getting anyone else sick

## 2013-09-22 NOTE — Telephone Encounter (Signed)
She can go ahead and start cipro. Advise to go ahead and start probiotic as well. Did she turn in stool tests? Make sure that is done before antibiotics started. Regarding attending family reunion. Reasonably safe to attend. Make sure she is washing hands. Avoid shaking hands with persons and don't serve any food.

## 2013-09-22 NOTE — Telephone Encounter (Signed)
Patient left message returning Sharon's call

## 2013-09-22 NOTE — Telephone Encounter (Signed)
LMOM with contact name and number for return call RE: provider instructions/SLS

## 2013-09-24 LAB — CLOSTRIDIUM DIFFICILE BY PCR: Toxigenic C. Difficile by PCR: NOT DETECTED

## 2013-09-26 ENCOUNTER — Telehealth: Payer: Self-pay | Admitting: Medical

## 2013-09-26 LAB — STOOL CULTURE

## 2013-09-26 NOTE — Telephone Encounter (Signed)
I did talk with pt and advised hr on negative stool tests. Her diarrhea is resolved. So advised stop antibiotic. Advised could use probiotic next 4-5 days. If diarrhea is recurrent asked pt to notify us.

## 2013-09-27 NOTE — Telephone Encounter (Signed)
Pt previously notified by MD.

## 2013-10-05 ENCOUNTER — Other Ambulatory Visit: Payer: Self-pay | Admitting: Family Medicine

## 2013-10-07 NOTE — Telephone Encounter (Signed)
See below and please advise if ok to refill. Medication is listed under historical provider.  Thanks!

## 2013-10-08 ENCOUNTER — Other Ambulatory Visit: Payer: Self-pay | Admitting: Family Medicine

## 2013-10-21 LAB — PTH, INTACT AND CALCIUM
Calcium: 9.4 mg/dL (ref 8.4–10.5)
PTH: 62.5 pg/mL (ref 14.0–72.0)

## 2013-10-24 ENCOUNTER — Ambulatory Visit (INDEPENDENT_AMBULATORY_CARE_PROVIDER_SITE_OTHER): Payer: 59 | Admitting: Surgery

## 2013-10-24 ENCOUNTER — Encounter (INDEPENDENT_AMBULATORY_CARE_PROVIDER_SITE_OTHER): Payer: Self-pay | Admitting: Surgery

## 2013-10-24 VITALS — BP 122/80 | HR 93 | Temp 98.0°F | Ht 65.0 in | Wt 198.0 lb

## 2013-10-24 DIAGNOSIS — E210 Primary hyperparathyroidism: Secondary | ICD-10-CM

## 2013-10-24 NOTE — Progress Notes (Signed)
General Surgery [facility]  Chief Complaint  Patient presents with  . Routine Post Op    parathryoidectomy 08/12/2013    HISTORY: Patient is a 53 year old female who underwent minimally invasive parathyroidectomy on 08/12/2013. Laboratory studies drawn on 10/20/2013 showing normal calcium of 9.4 and a normal intact PTH level of 62.5.  Patient notes that her energy level is improving. She has no complaints.  EXAM: Incision is well-healed. Good cosmetic result. No soft tissue swelling. No palpable abnormality. Voice quality is normal.  IMPRESSION: Status post minimally invasive parathyroidectomy  PLAN: Patient will continue to apply topical creams to her incision. She should have a calcium level obtained annually.  Patient will return for surgical care as needed.  Earnstine Regal, MD, [facility]   Visit Diagnoses: 1. Hyperparathyroidism, primary   2. Hypercalcemia

## 2013-10-24 NOTE — Patient Instructions (Signed)
  CARE OF INCISION   Apply cocoa butter/vitamin E cream (Palmer's brand) to your incision 2 - 3 times daily.  Massage cream into incision for one minute with each application.  Use sunscreen (50 SPF or higher) for first 6 months after surgery if area is exposed to sun.  You may alternate Mederma or other scar reducing cream with cocoa butter cream if desired.       Lorely Bubb M. Chantee Cerino, MD, FACS      [facility]      Office: [phone]    

## 2013-11-01 ENCOUNTER — Encounter: Payer: Self-pay | Admitting: Family Medicine

## 2013-11-01 ENCOUNTER — Ambulatory Visit (INDEPENDENT_AMBULATORY_CARE_PROVIDER_SITE_OTHER): Payer: 59 | Admitting: Family Medicine

## 2013-11-01 VITALS — BP 122/83 | HR 65 | Temp 98.9°F | Ht 65.0 in | Wt 200.1 lb

## 2013-11-01 DIAGNOSIS — F411 Generalized anxiety disorder: Secondary | ICD-10-CM

## 2013-11-01 DIAGNOSIS — K219 Gastro-esophageal reflux disease without esophagitis: Secondary | ICD-10-CM

## 2013-11-01 DIAGNOSIS — Z23 Encounter for immunization: Secondary | ICD-10-CM

## 2013-11-01 DIAGNOSIS — E210 Primary hyperparathyroidism: Secondary | ICD-10-CM

## 2013-11-01 DIAGNOSIS — E785 Hyperlipidemia, unspecified: Secondary | ICD-10-CM

## 2013-11-01 DIAGNOSIS — I10 Essential (primary) hypertension: Secondary | ICD-10-CM

## 2013-11-01 DIAGNOSIS — E669 Obesity, unspecified: Secondary | ICD-10-CM

## 2013-11-01 MED ORDER — ALPRAZOLAM 0.25 MG PO TABS: 0.2500 mg | ORAL_TABLET | Freq: Two times a day (BID) | ORAL | Status: DC | PRN

## 2013-11-01 MED ORDER — OMEPRAZOLE 40 MG PO CPDR: 40.0000 mg | DELAYED_RELEASE_CAPSULE | Freq: Every day | ORAL | Status: DC

## 2013-11-01 MED ORDER — LOSARTAN POTASSIUM 25 MG PO TABS: 25.0000 mg | ORAL_TABLET | Freq: Every day | ORAL | Status: DC

## 2013-11-01 MED ORDER — SIMVASTATIN 40 MG PO TABS: 40.0000 mg | ORAL_TABLET | Freq: Every day | ORAL | Status: DC

## 2013-11-01 MED ORDER — FUROSEMIDE 20 MG PO TABS: 20.0000 mg | ORAL_TABLET | Freq: Every day | ORAL | Status: DC

## 2013-11-01 NOTE — Progress Notes (Signed)
Pre visit review using our clinic review tool, if applicable. No additional management support is needed unless otherwise documented below in the visit note. 

## 2013-11-01 NOTE — Patient Instructions (Signed)
Labs in 1 month appt in roughly 4 months   Cholesterol Cholesterol is a white, waxy, fat-like substance needed by your body in small amounts. The liver makes all the cholesterol you need. Cholesterol is carried from the liver by the blood through the blood vessels. Deposits of cholesterol (plaque) may build up on blood vessel walls. These make the arteries narrower and stiffer. Cholesterol plaques increase the risk for heart attack and stroke.  You cannot feel your cholesterol level even if it is very high. The only way to know it is high is with a blood test. Once you know your cholesterol levels, you should keep a record of the test results. Work with your health care provider to keep your levels in the desired range.  WHAT DO THE RESULTS MEAN?  Total cholesterol is a rough measure of all the cholesterol in your blood.   LDL is the so-called bad cholesterol. This is the type that deposits cholesterol in the walls of the arteries. You want this level to be low.   HDL is the good cholesterol because it cleans the arteries and carries the LDL away. You want this level to be high.  Triglycerides are fat that the body can either burn for energy or store. High levels are closely linked to heart disease.  WHAT ARE THE DESIRED LEVELS OF CHOLESTEROL?  Total cholesterol below 200.   LDL below 100 for people at risk, below 70 for those at very high risk.   HDL above 50 is good, above 60 is best.   Triglycerides below 150.  HOW CAN I LOWER MY CHOLESTEROL?  Diet. Follow your diet programs as directed by your health care provider.   Choose fish or white meat chicken and [place], roasted or baked. Limit fatty cuts of red meat, fried foods, and processed meats, such as sausage and lunch meats.   Eat lots of fresh fruits and vegetables.  Choose whole grains, beans, pasta, potatoes, and cereals.   Use only small amounts of olive, corn, or canola oils.   Avoid butter, mayonnaise,  shortening, or palm kernel oils.  Avoid foods with trans fats.   Drink skim or nonfat milk and eat low-fat or nonfat yogurt and cheeses. Avoid whole milk, cream, ice cream, egg yolks, and full-fat cheeses.   Healthy desserts include angel food cake, ginger snaps, animal crackers, hard candy, popsicles, and low-fat or nonfat frozen yogurt. Avoid pastries, cakes, pies, and cookies.   Exercise. Follow your exercise programs as directed by your health care provider.   A regular program helps decrease LDL and raise HDL.   A regular program helps with weight control.   Do things that increase your activity level like gardening, walking, or taking the stairs. Ask your health care provider about how you can be more active in your daily life.   Medicine. Take medicine only as directed by your health care provider.   Medicine may be prescribed by your health care provider to help lower cholesterol and decrease the risk for heart disease.   If you have several risk factors, you may need medicine even if your levels are normal. Document Released: 11/19/2000 Document Revised: 07/11/2013 Document Reviewed: 12/08/2012 [facility] Patient Information 2015 [place], [place]. This information is not intended to replace advice given to you by your health care provider. Make sure you discuss any questions you have with your health care provider.

## 2013-11-02 ENCOUNTER — Encounter: Payer: Self-pay | Admitting: Family Medicine

## 2013-11-02 ENCOUNTER — Telehealth: Payer: Self-pay | Admitting: Family Medicine

## 2013-11-02 NOTE — Assessment & Plan Note (Signed)
Avoid offending foods, start probiotics. Do not eat large meals in late evening and consider raising head of bed.  

## 2013-11-02 NOTE — Assessment & Plan Note (Signed)
Well controlled, no changes to meds. Encouraged heart healthy diet such as the DASH diet and exercise as tolerated.  °

## 2013-11-02 NOTE — Assessment & Plan Note (Signed)
resolved 

## 2013-11-02 NOTE — Assessment & Plan Note (Signed)
Encouraged DASH diet, decrease po intake and increase exercise as tolerated. Needs 7-8 hours of sleep nightly. Avoid trans fats, eat small, frequent meals every 4-5 hours with lean proteins, complex carbs and healthy fats. Minimize simple carbs 

## 2013-11-02 NOTE — Progress Notes (Signed)
Patient ID: Sandra [name], female   DOB: 1960/05/12, 53 y.o.   MRN: [id number] Sandra [name] [id number] 05/27/1960 11/02/2013      Progress Note-Follow Up  Subjective  Chief Complaint  Chief Complaint  Patient presents with  . Medication Refill  . Injections    flu    HPI  Patient is a 53 year old female in today for routine medical care. Doing well s/p her surgery for her hyperparathyroidism. Feels good. Less tired less myalgias and arthralgias. No new concerns.Did [place] but is healing well. Denies CP/palp/SOB/HA/congestion/fevers/GI or GU c/o. Taking meds as prescribed  Past Medical History  Diagnosis Date  . Hypertension   . Chest pain     PT SAW CARDIOLOGIST DR. Burt Knack - STRESS ECHO DONE NEGATIVE - NO PROBLEM SINCE  . Hyperlipidemia   . Rapid heart beat     PT FEELS PROB RELATED TO HYPER PARATHYROIDISM  . Chicken pox as achild  . Hoarseness 02/04/2012    RESOLVED - THOUGHT TO HAVE BEEN ALLERY RELATED  . Ocular migraine   . GERD (gastroesophageal reflux disease)   . Preventative health care 02/04/2012  . SUI (stress urinary incontinence, female) 02/04/2012    Sees Dr Perlie Gold and they have discussed a bladder tack but so far she declines  . Overactive bladder 05/31/2012  . Elevated LFTs 05/31/2012  . Obesity, unspecified 05/31/2012  . Allergic rhinitis 05/31/2012  . Cough 11/29/2012    RESOLVED - IT WAS CAUSED BY LISINOPRIL  . H/O tobacco use, presenting hazards to health 11/29/2012    Smoked 1 1/2 ppd for 20 years quit in roughtly 2007  . Hyperparathyroidism     Past Surgical History  Procedure Laterality Date  . Pelvic laparoscopy  03-2004  . Wisdom tooth extraction  30 yrs ago  . Btl    . Tubal ligation    . Breast surgery      left breast excision of adenoma  . Parathyroidectomy Right 08/12/2013    Procedure: right inferior frozen section PARATHYROIDECTOMY;  Surgeon: Earnstine Regal, MD;  Location:  [facility];  Service: General;  Laterality: Right;    Family History  Problem Relation Age of Onset  . Hypertension Mother 23    alive  . Renal cancer Mother     renal  . Hypertension Father 80    alive  . Atrial fibrillation Father   . Colon cancer Father     colon  . Hypertension Brother 94    alive  . Hypertension Sister   . Heart disease Paternal Aunt   . Heart disease Paternal Uncle   . Lung cancer Maternal Grandfather     lung/ smoker  . Emphysema Maternal Grandfather   . Heart disease Paternal Grandmother   . Other Paternal Grandfather     black lung  . Heart disease Paternal Grandfather   . Hypertension Sister   . COPD Sister     History   Social History  . Marital Status: Married    Spouse Name: N/A    Number of Children: 2  . Years of Education: N/A   Occupational History  . RN   .  [employer]   Social History Main Topics  . Smoking status: Former Smoker -- 1.00 packs/day for 20 years    Types: Cigarettes    Quit date: 03/10/2005  . Smokeless tobacco: Never Used  . Alcohol Use: Yes  Comment: very rarely  . Drug Use: No  . Sexual Activity: Yes    Partners: Male   Other Topics Concern  . Not on file   Social History Narrative  . No narrative on file    Current Outpatient Prescriptions on File Prior to Visit  Medication Sig Dispense Refill  . aspirin EC 81 MG tablet Take 81 mg by mouth at bedtime.      . Cholecalciferol (VITAMIN D PO) Take 2,000 mg by mouth daily. Vitamin D      . Coenzyme Q10 (CO Q 10) 100 MG CAPS Take 400 mg by mouth daily.      Marland Kitchen estradiol-norethindrone (ACTIVELLA) 1-0.5 MG per tablet Take 1 tablet by mouth daily.      Marland Kitchen OVER THE COUNTER MEDICATION Take 1 tablet by mouth daily. Icool- daily      . OVER THE COUNTER MEDICATION Take 1 capsule by mouth daily. Mega red- 1 capsule daily       No current facility-administered medications on file prior to visit.    Allergies  Allergen Reactions  . Codeine Nausea And  Vomiting  . Penicillins     RASH, ITCHING    Review of Systems  Review of Systems  Constitutional: Negative for fever and malaise/fatigue.  HENT: Negative for congestion.   Eyes: Negative for discharge.  Respiratory: Negative for shortness of breath.   Cardiovascular: Negative for chest pain, palpitations and leg swelling.  Gastrointestinal: Negative for nausea, abdominal pain and diarrhea.  Genitourinary: Negative for dysuria.  Musculoskeletal: Negative for falls.  Skin: Negative for rash.  Neurological: Negative for loss of consciousness and headaches.  Endo/Heme/Allergies: Negative for polydipsia.  Psychiatric/Behavioral: Negative for depression and suicidal ideas. The patient is not nervous/anxious and does not have insomnia.     Objective  BP 122/83  Pulse 65  Temp(Src) 98.9 F (37.2 C) (Oral)  Ht 5\' 5"  (1.651 m)  Wt 200 lb 1.9 oz (90.774 kg)  BMI 33.30 kg/m2  SpO2 97%  LMP 04/01/2011  Physical Exam  Physical Exam  Constitutional: She is oriented to person, place, and time and well-developed, well-nourished, and in no distress. No distress.  HENT:  Head: Normocephalic and atraumatic.  Eyes: Conjunctivae are normal.  Neck: Neck supple. No thyromegaly present.  Cardiovascular: Normal rate, regular rhythm and normal heart sounds.   No murmur heard. Pulmonary/Chest: Effort normal and breath sounds normal. She has no wheezes.  Abdominal: She exhibits no distension and no mass.  Musculoskeletal: She exhibits no edema.  Lymphadenopathy:    She has no cervical adenopathy.  Neurological: She is alert and oriented to person, place, and time.  Skin: Skin is warm and dry. No rash noted. She is not diaphoretic.  Psychiatric: Memory, affect and judgment normal.    Lab Results  Component Value Date   TSH 1.953 11/29/2012   Lab Results  Component Value Date   WBC 6.1 08/04/2013   HGB 13.4 08/04/2013   HCT 40.1 08/04/2013   MCV 87.6 08/04/2013   PLT 253 08/04/2013    Lab Results  Component Value Date   CREATININE 0.57 08/04/2013   BUN 13 08/04/2013   NA 139 08/04/2013   K 5.0 08/04/2013   CL 100 08/04/2013   CO2 26 08/04/2013   Lab Results  Component Value Date   ALT 17 11/29/2012   AST 18 11/29/2012   ALKPHOS 60 11/29/2012   BILITOT 0.3 11/29/2012   Lab Results  Component Value Date   CHOL 183  11/29/2012   Lab Results  Component Value Date   HDL 44 11/29/2012   Lab Results  Component Value Date   LDLCALC 106* 11/29/2012   Lab Results  Component Value Date   TRIG 164* 11/29/2012   Lab Results  Component Value Date   CHOLHDL 4.2 11/29/2012     Assessment & Plan  Hypertension Well controlled, no changes to meds. Encouraged heart healthy diet such as the DASH diet and exercise as tolerated.   Hyperparathyroidism, primary Feels much better after surgical intervention. Is reporting less fatigue, less arthralgias. Has just seen her surgeon in follow up and was given a good report  Hyperlipidemia Tolerating statin, encouraged heart healthy diet, avoid trans fats, minimize simple carbs and saturated fats. Increase exercise as tolerated. Will need new lipid panel prior to visit  Hypercalcemia resolved  GERD (gastroesophageal reflux disease) Avoid offending foods, start probiotics. Do not eat large meals in late evening and consider raising head of bed.   Obesity, unspecified Encouraged DASH diet, decrease po intake and increase exercise as tolerated. Needs 7-8 hours of sleep nightly. Avoid trans fats, eat small, frequent meals every 4-5 hours with lean proteins, complex carbs and healthy fats. Minimize simple carbs

## 2013-11-02 NOTE — Assessment & Plan Note (Signed)
Tolerating statin, encouraged heart healthy diet, avoid trans fats, minimize simple carbs and saturated fats. Increase exercise as tolerated. Will need new lipid panel prior to visit

## 2013-11-02 NOTE — Telephone Encounter (Signed)
Relevant patient education assigned to patient using Emmi. ° °

## 2013-11-02 NOTE — Assessment & Plan Note (Signed)
Feels much better after surgical intervention. Is reporting less fatigue, less arthralgias. Has just seen her surgeon in follow up and was given a good report

## 2014-01-02 ENCOUNTER — Other Ambulatory Visit: Payer: Self-pay | Admitting: Family Medicine

## 2014-01-04 ENCOUNTER — Telehealth: Payer: Self-pay | Admitting: Family Medicine

## 2014-01-04 DIAGNOSIS — Z0000 Encounter for general adult medical examination without abnormal findings: Secondary | ICD-10-CM

## 2014-01-04 NOTE — Telephone Encounter (Signed)
Caller name: [place]  Call back [phone]   Reason for call:  Pt forgot to come get labs before the office moved. Would like whatever is needed for labs to be ordered and we get her scheduled.

## 2014-01-04 NOTE — Telephone Encounter (Signed)
Cbc. Renal, hepatic, tsh, lipid for annual, needs appt for annual also if we are going to run annual labs

## 2014-01-05 NOTE — Telephone Encounter (Signed)
Please inform pt that the labs are ordered but needs an appt for annual if were going to run annual labs

## 2014-01-09 ENCOUNTER — Other Ambulatory Visit: Payer: Self-pay | Admitting: Family Medicine

## 2014-01-30 ENCOUNTER — Encounter: Payer: Self-pay | Admitting: Medical

## 2014-01-30 ENCOUNTER — Ambulatory Visit (INDEPENDENT_AMBULATORY_CARE_PROVIDER_SITE_OTHER): Payer: 59 | Admitting: Medical

## 2014-01-30 VITALS — BP 153/96 | HR 95 | Temp 98.7°F | Ht 65.0 in | Wt 204.2 lb

## 2014-01-30 DIAGNOSIS — J0100 Acute maxillary sinusitis, unspecified: Secondary | ICD-10-CM

## 2014-01-30 MED ORDER — AZITHROMYCIN 250 MG PO TABS: ORAL_TABLET | ORAL | Status: DC

## 2014-01-30 NOTE — Progress Notes (Signed)
Subjective:    Patient ID: Sandra [name], female    DOB: 10/23/1960, 53 y.o.   MRN: [id number]  HPI   Pt in with feeling sick x 1 wk with mostly nasal congestion .Then last 2 days sore throat, ear pain, and sinus pressure also hoarse.   Pt son had illness that got better quickly with zpack.  Pt had flu vaccine last visit.  Past Medical History  Diagnosis Date  . Hypertension   . Chest pain     PT SAW CARDIOLOGIST DR. Burt Knack - STRESS ECHO DONE NEGATIVE - NO PROBLEM SINCE  . Hyperlipidemia   . Rapid heart beat     PT FEELS PROB RELATED TO HYPER PARATHYROIDISM  . Chicken pox as achild  . Hoarseness 02/04/2012    RESOLVED - THOUGHT TO HAVE BEEN ALLERY RELATED  . Ocular migraine   . GERD (gastroesophageal reflux disease)   . Preventative health care 02/04/2012  . SUI (stress urinary incontinence, female) 02/04/2012    Sees Dr Perlie Gold and they have discussed a bladder tack but so far she declines  . Overactive bladder 05/31/2012  . Elevated LFTs 05/31/2012  . Obesity, unspecified 05/31/2012  . Allergic rhinitis 05/31/2012  . Cough 11/29/2012    RESOLVED - IT WAS CAUSED BY LISINOPRIL  . H/O tobacco use, presenting hazards to health 11/29/2012    Smoked 1 1/2 ppd for 20 years quit in roughtly 2007  . Hyperparathyroidism     History   Social History  . Marital Status: Married    Spouse Name: N/A    Number of Children: 2  . Years of Education: N/A   Occupational History  . RN   .  [employer]   Social History Main Topics  . Smoking status: Former Smoker -- 1.00 packs/day for 20 years    Types: Cigarettes    Quit date: 03/10/2005  . Smokeless tobacco: Never Used  . Alcohol Use: Yes     Comment: very rarely  . Drug Use: No  . Sexual Activity:    Partners: Male   Other Topics Concern  . Not on file   Social History Narrative    Past Surgical History  Procedure Laterality Date  . Pelvic laparoscopy  03-2004  . Wisdom tooth extraction  30 yrs ago  .  Btl    . Tubal ligation    . Breast surgery      left breast excision of adenoma  . Parathyroidectomy Right 08/12/2013    Procedure: right inferior frozen section PARATHYROIDECTOMY;  Surgeon: Earnstine Regal, MD;  Location: [facility];  Service: General;  Laterality: Right;    Family History  Problem Relation Age of Onset  . Hypertension Mother 39    alive  . Renal cancer Mother     renal  . Hypertension Father 57    alive  . Atrial fibrillation Father   . Colon cancer Father     colon  . Hypertension Brother 26    alive  . Hypertension Sister   . Heart disease Paternal Aunt   . Heart disease Paternal Uncle   . Lung cancer Maternal Grandfather     lung/ smoker  . Emphysema Maternal Grandfather   . Heart disease Paternal Grandmother   . Other Paternal Grandfather     black lung  . Heart disease Paternal Grandfather   . Hypertension Sister   . COPD Sister     Allergies  Allergen Reactions  . Codeine Nausea And  Vomiting  . Penicillins     RASH, ITCHING    Current Outpatient Prescriptions on File Prior to Visit  Medication Sig Dispense Refill  . ALPRAZolam (XANAX) 0.25 MG tablet Take 1 tablet (0.25 mg total) by mouth 2 (two) times daily as needed for anxiety. 10 tablet 0  . aspirin EC 81 MG tablet Take 81 mg by mouth at bedtime.    . Cholecalciferol (VITAMIN D PO) Take 2,000 mg by mouth daily. Vitamin D    . Coenzyme Q10 (CO Q 10) 100 MG CAPS Take 400 mg by mouth daily.    Marland Kitchen estradiol-norethindrone (ACTIVELLA) 1-0.5 MG per tablet Take 1 tablet by mouth daily.    . furosemide (LASIX) 20 MG tablet TAKE 1 TABLET (20 MG TOTAL) BY MOUTH DAILY. 90 tablet 0  . losartan (COZAAR) 25 MG tablet TAKE 1 TABLET (25 MG TOTAL) BY MOUTH DAILY. 90 tablet 0  . omeprazole (PRILOSEC) 40 MG capsule Take 1 capsule (40 mg total) by mouth daily. 90 capsule 1  . OVER THE COUNTER MEDICATION Take 1 tablet by mouth daily. Icool- daily    . OVER THE COUNTER MEDICATION Take 1 capsule by mouth daily. Mega  red- 1 capsule daily    . simvastatin (ZOCOR) 40 MG tablet Take 1 tablet (40 mg total) by mouth daily at 6 PM. 90 tablet 1   No current facility-administered medications on file prior to visit.    BP 153/96 mmHg  Pulse 95  Temp(Src) 98.7 F (37.1 C) (Oral)  Ht 5\' 5"  (1.651 m)  Wt 204 lb 3.2 oz (92.625 kg)  BMI 33.98 kg/m2  SpO2 97%  LMP 04/01/2011        Review of Systems  Constitutional: Negative for fever, chills and fatigue.  HENT: Positive for congestion, ear pain, sinus pressure and sore throat. Negative for ear discharge, nosebleeds, postnasal drip, rhinorrhea and trouble swallowing.   Respiratory: Positive for cough. Negative for chest tightness, shortness of breath and wheezing.        Occasional cough not severe.  Cardiovascular: Negative for chest pain and palpitations.  Gastrointestinal: Negative for nausea, vomiting, abdominal pain, diarrhea and constipation.  Genitourinary: Negative for dysuria and flank pain.  Musculoskeletal: Negative for back pain.  Neurological: Negative for dizziness, tremors, seizures, syncope, weakness, light-headedness, numbness and headaches.  Hematological: Negative for adenopathy. Does not bruise/bleed easily.  Psychiatric/Behavioral: Negative for behavioral problems.       Objective:   Physical Exam  General  Mental Status - Alert. General Appearance - Well groomed. Not in acute distress.  Skin Rashes- No Rashes.  HEENT Head- Normal. Ear Auditory Canal - Left- Normal. Right - Normal.Tympanic Membrane- Left- Normal. Right- Normal. Eye Sclera/Conjunctiva- Left- Normal. Right- Normal. Nose & Sinuses Nasal Mucosa- Left-  Boggy + Congested. Right- Boggy + Congested. Maxillary sinus pressure Mouth & Throat Lips: Upper Lip- Normal: no dryness, cracking, pallor, cyanosis, or vesicular eruption. Lower Lip-Normal: no dryness, cracking, pallor, cyanosis or vesicular eruption. Buccal Mucosa- Bilateral- No Aphthous  ulcers. Oropharynx- No Discharge or Erythema. Tonsils: Characteristics- Bilateral-  Erythema +  Congestion. Size/Enlargement- 1+  Bilateral- No enlargement. Discharge- bilateral-None.  Neck Neck- Supple. No Masses.   Chest and Lung Exam Auscultation: Breath Sounds:-Normal, CTA  Cardiovascular Auscultation:Rythm- Regular, rate and rhythm. Murmurs & Other Heart Sounds:Ausculatation of the heart reveal- No Murmurs.  Lymphatic Head & Neck General Head & Neck Lymphatics: Bilateral: Description- No Localized lymphadenopathy.        Assessment & Plan:

## 2014-01-30 NOTE — Patient Instructions (Addendum)
You appear to have sinusitis after having one week of nasal congestion. I am prescribing azithromycin. Continue benzonatate and nasal steroid spray.  Follow up in 7 days or as needed.

## 2014-01-30 NOTE — Progress Notes (Signed)
Pre visit review using our clinic review tool, if applicable. No additional management support is needed unless otherwise documented below in the visit note. 

## 2014-01-31 DIAGNOSIS — J0100 Acute maxillary sinusitis, unspecified: Secondary | ICD-10-CM | POA: Insufficient documentation

## 2014-01-31 NOTE — Assessment & Plan Note (Signed)
sinusitis after having one week of nasal congestion. I am prescribing azithromycin. Continue benzonatate and nasal steroid spray.(Both which she has at home)

## 2014-03-10 LAB — HM PAP SMEAR

## 2014-03-25 ENCOUNTER — Other Ambulatory Visit: Payer: Self-pay | Admitting: Family Medicine

## 2014-03-28 NOTE — Telephone Encounter (Signed)
Patient called and lab appointment made for 04/04/14 for fasting labs.  Patient unable to schedule physical at this time, will call back to schedule.   eal

## 2014-04-04 ENCOUNTER — Other Ambulatory Visit (INDEPENDENT_AMBULATORY_CARE_PROVIDER_SITE_OTHER): Payer: 59

## 2014-04-04 DIAGNOSIS — E210 Primary hyperparathyroidism: Secondary | ICD-10-CM

## 2014-04-04 DIAGNOSIS — K219 Gastro-esophageal reflux disease without esophagitis: Secondary | ICD-10-CM

## 2014-04-04 DIAGNOSIS — E785 Hyperlipidemia, unspecified: Secondary | ICD-10-CM

## 2014-04-04 DIAGNOSIS — I10 Essential (primary) hypertension: Secondary | ICD-10-CM

## 2014-04-04 DIAGNOSIS — F411 Generalized anxiety disorder: Secondary | ICD-10-CM

## 2014-04-04 LAB — LIPID PANEL
Cholesterol: 168 mg/dL (ref 0–200)
HDL: 43.1 mg/dL (ref >39.00)
LDL Cholesterol: 90 mg/dL (ref 0–99)
NonHDL: 124.9
Total CHOL/HDL Ratio: 4
Triglycerides: 175 mg/dL — ABNORMAL HIGH (ref 0.0–149.0)
VLDL: 35 mg/dL (ref 0.0–40.0)

## 2014-04-04 LAB — RENAL FUNCTION PANEL
Albumin: 4.2 g/dL (ref 3.5–5.2)
BUN: 11 mg/dL (ref 6–23)
CO2: 27 mEq/L (ref 19–32)
Calcium: 9.7 mg/dL (ref 8.4–10.5)
Chloride: 104 mEq/L (ref 96–112)
Creatinine, Ser: 0.75 mg/dL (ref 0.40–1.20)
GFR: 86.18 mL/min (ref >60.00)
Glucose, Bld: 108 mg/dL — ABNORMAL HIGH (ref 70–99)
Phosphorus: 2.6 mg/dL (ref 2.3–4.6)
Potassium: 3.9 mEq/L (ref 3.5–5.1)
Sodium: 140 mEq/L (ref 135–145)

## 2014-04-04 LAB — CBC WITH DIFFERENTIAL/PLATELET
Basophils Absolute: 0 10*3/uL (ref 0.0–0.1)
Basophils Relative: 0.7 % (ref 0.0–3.0)
Eosinophils Absolute: 0 10*3/uL (ref 0.0–0.7)
Eosinophils Relative: 0.7 % (ref 0.0–5.0)
HCT: 40.2 % (ref 36.0–46.0)
Hemoglobin: 13.4 g/dL (ref 12.0–15.0)
Lymphocytes Relative: 32.3 % (ref 12.0–46.0)
Lymphs Abs: 2 10*3/uL (ref 0.7–4.0)
MCHC: 33.4 g/dL (ref 30.0–36.0)
MCV: 86.7 fl (ref 78.0–100.0)
Monocytes Absolute: 0.4 10*3/uL (ref 0.1–1.0)
Monocytes Relative: 7.2 % (ref 3.0–12.0)
Neutro Abs: 3.7 10*3/uL (ref 1.4–7.7)
Neutrophils Relative %: 59.1 % (ref 43.0–77.0)
Platelets: 302 10*3/uL (ref 150.0–400.0)
RBC: 4.64 Mil/uL (ref 3.87–5.11)
RDW: 14 % (ref 11.5–15.5)
WBC: 6.3 10*3/uL (ref 4.0–10.5)

## 2014-04-04 LAB — HEPATIC FUNCTION PANEL
ALT: 19 U/L (ref 0–35)
AST: 17 U/L (ref 0–37)
Albumin: 4.2 g/dL (ref 3.5–5.2)
Alkaline Phosphatase: 69 U/L (ref 39–117)
Bilirubin, Direct: 0 mg/dL (ref 0.0–0.3)
Total Bilirubin: 0.2 mg/dL (ref 0.2–1.2)
Total Protein: 7.3 g/dL (ref 6.0–8.3)

## 2014-04-04 LAB — TSH: TSH: 3.49 u[IU]/mL (ref 0.35–4.50)

## 2014-04-04 NOTE — Addendum Note (Signed)
Addended by: Harl Bowie on: 04/04/2014 08:24 AM   Modules accepted: Orders

## 2014-04-06 ENCOUNTER — Ambulatory Visit (INDEPENDENT_AMBULATORY_CARE_PROVIDER_SITE_OTHER): Payer: 59 | Admitting: Family Medicine

## 2014-04-06 ENCOUNTER — Encounter: Payer: Self-pay | Admitting: Family Medicine

## 2014-04-06 VITALS — BP 155/98 | HR 87 | Temp 98.3°F | Resp 16 | Ht 65.0 in | Wt 204.2 lb

## 2014-04-06 DIAGNOSIS — Z0000 Encounter for general adult medical examination without abnormal findings: Secondary | ICD-10-CM

## 2014-04-06 DIAGNOSIS — E669 Obesity, unspecified: Secondary | ICD-10-CM

## 2014-04-06 DIAGNOSIS — R319 Hematuria, unspecified: Secondary | ICD-10-CM

## 2014-04-06 DIAGNOSIS — R300 Dysuria: Secondary | ICD-10-CM

## 2014-04-06 DIAGNOSIS — K219 Gastro-esophageal reflux disease without esophagitis: Secondary | ICD-10-CM

## 2014-04-06 DIAGNOSIS — E785 Hyperlipidemia, unspecified: Secondary | ICD-10-CM

## 2014-04-06 DIAGNOSIS — R739 Hyperglycemia, unspecified: Secondary | ICD-10-CM

## 2014-04-06 DIAGNOSIS — E1169 Type 2 diabetes mellitus with other specified complication: Secondary | ICD-10-CM | POA: Insufficient documentation

## 2014-04-06 DIAGNOSIS — I10 Essential (primary) hypertension: Secondary | ICD-10-CM

## 2014-04-06 HISTORY — DX: Hyperglycemia, unspecified: R73.9

## 2014-04-06 LAB — POCT URINALYSIS DIPSTICK
Bilirubin, UA: NEGATIVE
Glucose, UA: NEGATIVE
Ketones, UA: NEGATIVE
Leukocytes, UA: NEGATIVE
Nitrite, UA: NEGATIVE
Protein, UA: NEGATIVE
Spec Grav, UA: 1.025
Urobilinogen, UA: 4
pH, UA: 6

## 2014-04-06 LAB — HEMOGLOBIN A1C: Hgb A1c MFr Bld: 6.1 % (ref 4.6–6.5)

## 2014-04-06 MED ORDER — LOSARTAN POTASSIUM 50 MG PO TABS: 50.0000 mg | ORAL_TABLET | Freq: Every day | ORAL | Status: DC

## 2014-04-06 MED ORDER — SULFAMETHOXAZOLE-TRIMETHOPRIM 800-160 MG PO TABS: 1.0000 | ORAL_TABLET | Freq: Two times a day (BID) | ORAL | Status: DC

## 2014-04-06 NOTE — Assessment & Plan Note (Signed)
Sugar and Triglycerides still up some, check [id number]

## 2014-04-06 NOTE — Progress Notes (Signed)
Pre visit review using our clinic review tool, if applicable. No additional management support is needed unless otherwise documented below in the visit note. 

## 2014-04-06 NOTE — Patient Instructions (Signed)
Consider daily probiotic such as [employer] or [facility] health  NEEDS nurse only appt in 4 week for BP check   Appt with MD in 6 months     Preventive Care for Adults A healthy lifestyle and preventive care can promote health and wellness. Preventive health guidelines for women include the following key practices.  A routine yearly physical is a good way to check with your health care provider about your health and preventive screening. It is a chance to share any concerns and updates on your health and to receive a thorough exam.  Visit your dentist for a routine exam and preventive care every 6 months. Brush your teeth twice a day and floss once a day. Good oral hygiene prevents tooth decay and gum disease.  The frequency of eye exams is based on your age, health, family medical history, use of contact lenses, and other factors. Follow your health care provider's recommendations for frequency of eye exams.  Eat a healthy diet. Foods like vegetables, fruits, whole grains, low-fat dairy products, and lean protein foods contain the nutrients you need without too many calories. Decrease your intake of foods high in solid fats, added sugars, and salt. Eat the right amount of calories for you.Get information about a proper diet from your health care provider, if necessary.  Regular physical exercise is one of the most important things you can do for your health. Most adults should get at least 150 minutes of moderate-intensity exercise (any activity that increases your heart rate and causes you to sweat) each week. In addition, most adults need muscle-strengthening exercises on 2 or more days a week.  Maintain a healthy weight. The body mass index (BMI) is a screening tool to identify possible weight problems. It provides an estimate of body fat based on height and weight. Your health care provider can find your BMI and can help you achieve or maintain a healthy weight.For adults  20 years and older:  A BMI below 18.5 is considered underweight.  A BMI of 18.5 to 24.9 is normal.  A BMI of 25 to 29.9 is considered overweight.  A BMI of 30 and above is considered obese.  Maintain normal blood lipids and cholesterol levels by exercising and minimizing your intake of saturated fat. Eat a balanced diet with plenty of fruit and vegetables. Blood tests for lipids and cholesterol should begin at age 63 and be repeated every 5 years. If your lipid or cholesterol levels are high, you are over 50, or you are at high risk for heart disease, you may need your cholesterol levels checked more frequently.Ongoing high lipid and cholesterol levels should be treated with medicines if diet and exercise are not working.  If you smoke, find out from your health care provider how to quit. If you do not use tobacco, do not start.  Lung cancer screening is recommended for adults aged 31-80 years who are at high risk for developing lung cancer because of a history of smoking. A yearly low-dose CT scan of the lungs is recommended for people who have at least a 30-pack-year history of smoking and are a current smoker or have quit within the past 15 years. A pack year of smoking is smoking an average of 1 pack of cigarettes a day for 1 year (for example: 1 pack a day for 30 years or 2 packs a day for 15 years). Yearly screening should continue until the smoker has stopped smoking for at least 15 years.  Yearly screening should be stopped for people who develop a health problem that would prevent them from having lung cancer treatment.  If you are pregnant, do not drink alcohol. If you are breastfeeding, be very cautious about drinking alcohol. If you are not pregnant and choose to drink alcohol, do not have more than 1 drink per day. One drink is considered to be 12 ounces (355 mL) of beer, 5 ounces (148 mL) of wine, or 1.5 ounces (44 mL) of liquor.  Avoid use of street drugs. Do not share needles with  anyone. Ask for help if you need support or instructions about stopping the use of drugs.  High blood pressure causes heart disease and increases the risk of stroke. Your blood pressure should be checked at least every 1 to 2 years. Ongoing high blood pressure should be treated with medicines if weight loss and exercise do not work.  If you are 53-9 years old, ask your health care provider if you should take aspirin to prevent strokes.  Diabetes screening involves taking a blood sample to check your fasting blood sugar level. This should be done once every 3 years, after age 68, if you are within normal weight and without risk factors for diabetes. Testing should be considered at a younger age or be carried out more frequently if you are overweight and have at least 1 risk factor for diabetes.  Breast cancer screening is essential preventive care for women. You should practice "breast self-awareness." This means understanding the normal appearance and feel of your breasts and may include breast self-examination. Any changes detected, no matter how small, should be reported to a health care provider. Women in their 80s and 30s should have a clinical breast exam (CBE) by a health care provider as part of a regular health exam every 1 to 3 years. After age 13, women should have a CBE every year. Starting at age 32, women should consider having a mammogram (breast X-ray test) every year. Women who have a family history of breast cancer should talk to their health care provider about genetic screening. Women at a high risk of breast cancer should talk to their health care providers about having an MRI and a mammogram every year.  Breast cancer gene (BRCA)-related cancer risk assessment is recommended for women who have family members with BRCA-related cancers. BRCA-related cancers include breast, ovarian, tubal, and peritoneal cancers. Having family members with these cancers may be associated with an  increased risk for harmful changes (mutations) in the breast cancer genes BRCA1 and BRCA2. Results of the assessment will determine the need for genetic counseling and BRCA1 and BRCA2 testing.  Routine pelvic exams to screen for cancer are no longer recommended for nonpregnant women who are considered low risk for cancer of the pelvic organs (ovaries, uterus, and vagina) and who do not have symptoms. Ask your health care provider if a screening pelvic exam is right for you.  If you have had past treatment for cervical cancer or a condition that could lead to cancer, you need Pap tests and screening for cancer for at least 20 years after your treatment. If Pap tests have been discontinued, your risk factors (such as having a new sexual partner) need to be reassessed to determine if screening should be resumed. Some women have medical problems that increase the chance of getting cervical cancer. In these cases, your health care provider may recommend more frequent screening and Pap tests.  The HPV test is an additional  test that may be used for cervical cancer screening. The HPV test looks for the virus that can cause the cell changes on the cervix. The cells collected during the Pap test can be tested for HPV. The HPV test could be used to screen women aged 50 years and older, and should be used in women of any age who have unclear Pap test results. After the age of 69, women should have HPV testing at the same frequency as a Pap test.  Colorectal cancer can be detected and often prevented. Most routine colorectal cancer screening begins at the age of 24 years and continues through age 35 years. However, your health care provider may recommend screening at an earlier age if you have risk factors for colon cancer. On a yearly basis, your health care provider may provide home test kits to check for hidden blood in the stool. Use of a small camera at the end of a tube, to directly examine the colon  (sigmoidoscopy or colonoscopy), can detect the earliest forms of colorectal cancer. Talk to your health care provider about this at age 29, when routine screening begins. Direct exam of the colon should be repeated every 5-10 years through age 28 years, unless early forms of pre-cancerous polyps or small growths are found.  People who are at an increased risk for hepatitis B should be screened for this virus. You are considered at high risk for hepatitis B if:  You were born in a country where hepatitis B occurs often. Talk with your health care provider about which countries are considered high risk.  Your parents were born in a high-risk country and you have not received a shot to protect against hepatitis B (hepatitis B vaccine).  You have HIV or AIDS.  You use needles to inject street drugs.  You live with, or have sex with, someone who has hepatitis B.  You get hemodialysis treatment.  You take certain medicines for conditions like cancer, organ transplantation, and autoimmune conditions.  Hepatitis C blood testing is recommended for all people born from [birth year] through 1965 and any individual with known risks for hepatitis C.  Practice safe sex. Use condoms and avoid high-risk sexual practices to reduce the spread of sexually transmitted infections (STIs). STIs include gonorrhea, chlamydia, syphilis, trichomonas, herpes, HPV, and human immunodeficiency virus (HIV). Herpes, HIV, and HPV are viral illnesses that have no cure. They can result in disability, cancer, and death.  You should be screened for sexually transmitted illnesses (STIs) including gonorrhea and chlamydia if:  You are sexually active and are younger than 24 years.  You are older than 24 years and your health care provider tells you that you are at risk for this type of infection.  Your sexual activity has changed since you were last screened and you are at an increased risk for chlamydia or gonorrhea. Ask your health  care provider if you are at risk.  If you are at risk of being infected with HIV, it is recommended that you take a prescription medicine daily to prevent HIV infection. This is called preexposure prophylaxis (PrEP). You are considered at risk if:  You are a heterosexual woman, are sexually active, and are at increased risk for HIV infection.  You take drugs by injection.  You are sexually active with a partner who has HIV.  Talk with your health care provider about whether you are at high risk of being infected with HIV. If you choose to begin PrEP, you should  first be tested for HIV. You should then be tested every 3 months for as long as you are taking PrEP.  Osteoporosis is a disease in which the bones lose minerals and strength with aging. This can result in serious bone fractures or breaks. The risk of osteoporosis can be identified using a bone density scan. Women ages 8 years and over and women at risk for fractures or osteoporosis should discuss screening with their health care providers. Ask your health care provider whether you should take a calcium supplement or vitamin D to reduce the rate of osteoporosis.  Menopause can be associated with physical symptoms and risks. Hormone replacement therapy is available to decrease symptoms and risks. You should talk to your health care provider about whether hormone replacement therapy is right for you.  Use sunscreen. Apply sunscreen liberally and repeatedly throughout the day. You should seek shade when your shadow is shorter than you. Protect yourself by wearing long sleeves, pants, a wide-brimmed hat, and sunglasses year round, whenever you are outdoors.  Once a month, do a whole body skin exam, using a mirror to look at the skin on your back. Tell your health care provider of new moles, moles that have irregular borders, moles that are larger than a pencil eraser, or moles that have changed in shape or color.  Stay current with required  vaccines (immunizations).  Influenza vaccine. All adults should be immunized every year.  Tetanus, diphtheria, and acellular pertussis (Td, Tdap) vaccine. Pregnant women should receive 1 dose of Tdap vaccine during each pregnancy. The dose should be obtained regardless of the length of time since the last dose. Immunization is preferred during the 27th-36th week of gestation. An adult who has not previously received Tdap or who does not know her vaccine status should receive 1 dose of Tdap. This initial dose should be followed by tetanus and diphtheria toxoids (Td) booster doses every 10 years. Adults with an unknown or incomplete history of completing a 3-dose immunization series with Td-containing vaccines should begin or complete a primary immunization series including a Tdap dose. Adults should receive a Td booster every 10 years.  Varicella vaccine. An adult without evidence of immunity to varicella should receive 2 doses or a second dose if she has previously received 1 dose. Pregnant females who do not have evidence of immunity should receive the first dose after pregnancy. This first dose should be obtained before leaving the health care facility. The second dose should be obtained 4-8 weeks after the first dose.  Human papillomavirus (HPV) vaccine. Females aged 13-26 years who have not received the vaccine previously should obtain the 3-dose series. The vaccine is not recommended for use in pregnant females. However, pregnancy testing is not needed before receiving a dose. If a female is found to be pregnant after receiving a dose, no treatment is needed. In that case, the remaining doses should be delayed until after the pregnancy. Immunization is recommended for any person with an immunocompromised condition through the age of 39 years if she did not get any or all doses earlier. During the 3-dose series, the second dose should be obtained 4-8 weeks after the first dose. The third dose should be  obtained 24 weeks after the first dose and 16 weeks after the second dose.  Zoster vaccine. One dose is recommended for adults aged 46 years or older unless certain conditions are present.  Measles, mumps, and rubella (MMR) vaccine. Adults born before [birth year] generally are considered immune to  measles and mumps. Adults born in [birth year] or later should have 1 or more doses of MMR vaccine unless there is a contraindication to the vaccine or there is laboratory evidence of immunity to each of the three diseases. A routine second dose of MMR vaccine should be obtained at least 28 days after the first dose for students attending postsecondary schools, health care workers, or international travelers. People who received inactivated measles vaccine or an unknown type of measles vaccine during 1963-1967 should receive 2 doses of MMR vaccine. People who received inactivated mumps vaccine or an unknown type of mumps vaccine before 1979 and are at high risk for mumps infection should consider immunization with 2 doses of MMR vaccine. For females of childbearing age, rubella immunity should be determined. If there is no evidence of immunity, females who are not pregnant should be vaccinated. If there is no evidence of immunity, females who are pregnant should delay immunization until after pregnancy. Unvaccinated health care workers born before 30 who lack laboratory evidence of measles, mumps, or rubella immunity or laboratory confirmation of disease should consider measles and mumps immunization with 2 doses of MMR vaccine or rubella immunization with 1 dose of MMR vaccine.  Pneumococcal 13-valent conjugate (PCV13) vaccine. When indicated, a person who is uncertain of her immunization history and has no record of immunization should receive the PCV13 vaccine. An adult aged 8 years or older who has certain medical conditions and has not been previously immunized should receive 1 dose of PCV13 vaccine. This PCV13 should be  followed with a dose of pneumococcal polysaccharide (PPSV23) vaccine. The PPSV23 vaccine dose should be obtained at least 8 weeks after the dose of PCV13 vaccine. An adult aged 40 years or older who has certain medical conditions and previously received 1 or more doses of PPSV23 vaccine should receive 1 dose of PCV13. The PCV13 vaccine dose should be obtained 1 or more years after the last PPSV23 vaccine dose.  Pneumococcal polysaccharide (PPSV23) vaccine. When PCV13 is also indicated, PCV13 should be obtained first. All adults aged 53 years and older should be immunized. An adult younger than age 22 years who has certain medical conditions should be immunized. Any person who resides in a nursing home or long-term care facility should be immunized. An adult smoker should be immunized. People with an immunocompromised condition and certain other conditions should receive both PCV13 and PPSV23 vaccines. People with human immunodeficiency virus (HIV) infection should be immunized as soon as possible after diagnosis. Immunization during chemotherapy or radiation therapy should be avoided. Routine use of PPSV23 vaccine is not recommended for American Indians, [place] Natives, or people younger than 65 years unless there are medical conditions that require PPSV23 vaccine. When indicated, people who have unknown immunization and have no record of immunization should receive PPSV23 vaccine. One-time revaccination 5 years after the first dose of PPSV23 is recommended for people aged 19-64 years who have chronic kidney failure, nephrotic syndrome, asplenia, or immunocompromised conditions. People who received 1-2 doses of PPSV23 before age 10 years should receive another dose of PPSV23 vaccine at age 35 years or later if at least 5 years have passed since the previous dose. Doses of PPSV23 are not needed for people immunized with PPSV23 at or after age 30 years.  Meningococcal vaccine. Adults with asplenia or persistent  complement component deficiencies should receive 2 doses of quadrivalent meningococcal conjugate (MenACWY-D) vaccine. The doses should be obtained at least 2 months apart. Microbiologists working with certain meningococcal  bacteria, [employer] recruits, people at risk during an outbreak, and people who travel to or live in countries with a high rate of meningitis should be immunized. A first-year college student up through age 37 years who is living in a residence hall should receive a dose if she did not receive a dose on or after her 16th birthday. Adults who have certain high-risk conditions should receive one or more doses of vaccine.  Hepatitis A vaccine. Adults who wish to be protected from this disease, have certain high-risk conditions, work with hepatitis A-infected animals, work in hepatitis A research labs, or travel to or work in countries with a high rate of hepatitis A should be immunized. Adults who were previously unvaccinated and who anticipate close contact with an international adoptee during the first 60 days after arrival in the [place] from a country with a high rate of hepatitis A should be immunized.  Hepatitis B vaccine. Adults who wish to be protected from this disease, have certain high-risk conditions, may be exposed to blood or other infectious body fluids, are household contacts or sex partners of hepatitis B positive people, are clients or workers in certain care facilities, or travel to or work in countries with a high rate of hepatitis B should be immunized.  Haemophilus influenzae type b (Hib) vaccine. A previously unvaccinated person with asplenia or sickle cell disease or having a scheduled splenectomy should receive 1 dose of Hib vaccine. Regardless of previous immunization, a recipient of a hematopoietic stem cell transplant should receive a 3-dose series 6-12 months after her successful transplant. Hib vaccine is not recommended for adults with HIV  infection. Preventive Services / Frequency Ages 35 to 33 years  Blood pressure check.** / Every 1 to 2 years.  Lipid and cholesterol check.** / Every 5 years beginning at age 46.  Clinical breast exam.** / Every 3 years for women in their 40s and 33s.  BRCA-related cancer risk assessment.** / For women who have family members with a BRCA-related cancer (breast, ovarian, tubal, or peritoneal cancers).  Pap test.** / Every 2 years from ages 61 through 46. Every 3 years starting at age 18 through age 38 or 81 with a history of 3 consecutive normal Pap tests.  HPV screening.** / Every 3 years from ages 75 through ages 64 to 41 with a history of 3 consecutive normal Pap tests.  Hepatitis C blood test.** / For any individual with known risks for hepatitis C.  Skin self-exam. / Monthly.  Influenza vaccine. / Every year.  Tetanus, diphtheria, and acellular pertussis (Tdap, Td) vaccine.** / Consult your health care provider. Pregnant women should receive 1 dose of Tdap vaccine during each pregnancy. 1 dose of Td every 10 years.  Varicella vaccine.** / Consult your health care provider. Pregnant females who do not have evidence of immunity should receive the first dose after pregnancy.  HPV vaccine. / 3 doses over 6 months, if 36 and younger. The vaccine is not recommended for use in pregnant females. However, pregnancy testing is not needed before receiving a dose.  Measles, mumps, rubella (MMR) vaccine.** / You need at least 1 dose of MMR if you were born in 1957 or later. You may also need a 2nd dose. For females of childbearing age, rubella immunity should be determined. If there is no evidence of immunity, females who are not pregnant should be vaccinated. If there is no evidence of immunity, females who are pregnant should delay immunization until after pregnancy.  Pneumococcal 13-valent conjugate (PCV13) vaccine.** / Consult your health care provider.  Pneumococcal polysaccharide  (PPSV23) vaccine.** / 1 to 2 doses if you smoke cigarettes or if you have certain conditions.  Meningococcal vaccine.** / 1 dose if you are age 3 to 49 years and a Market researcher living in a residence hall, or have one of several medical conditions, you need to get vaccinated against meningococcal disease. You may also need additional booster doses.  Hepatitis A vaccine.** / Consult your health care provider.  Hepatitis B vaccine.** / Consult your health care provider.  Haemophilus influenzae type b (Hib) vaccine.** / Consult your health care provider. Ages 27 to 92 years  Blood pressure check.** / Every 1 to 2 years.  Lipid and cholesterol check.** / Every 5 years beginning at age 56 years.  Lung cancer screening. / Every year if you are aged 14-80 years and have a 30-pack-year history of smoking and currently smoke or have quit within the past 15 years. Yearly screening is stopped once you have quit smoking for at least 15 years or develop a health problem that would prevent you from having lung cancer treatment.  Clinical breast exam.** / Every year after age 34 years.  BRCA-related cancer risk assessment.** / For women who have family members with a BRCA-related cancer (breast, ovarian, tubal, or peritoneal cancers).  Mammogram.** / Every year beginning at age 56 years and continuing for as long as you are in good health. Consult with your health care provider.  Pap test.** / Every 3 years starting at age 69 years through age 88 or 66 years with a history of 3 consecutive normal Pap tests.  HPV screening.** / Every 3 years from ages 22 years through ages 40 to 52 years with a history of 3 consecutive normal Pap tests.  Fecal occult blood test (FOBT) of stool. / Every year beginning at age 49 years and continuing until age 31 years. You may not need to do this test if you get a colonoscopy every 10 years.  Flexible sigmoidoscopy or colonoscopy.** / Every 5 years for a  flexible sigmoidoscopy or every 10 years for a colonoscopy beginning at age 33 years and continuing until age 42 years.  Hepatitis C blood test.** / For all people born from [birth year] through 1964 and any individual with known risks for hepatitis C.  Skin self-exam. / Monthly.  Influenza vaccine. / Every year.  Tetanus, diphtheria, and acellular pertussis (Tdap/Td) vaccine.** / Consult your health care provider. Pregnant women should receive 1 dose of Tdap vaccine during each pregnancy. 1 dose of Td every 10 years.  Varicella vaccine.** / Consult your health care provider. Pregnant females who do not have evidence of immunity should receive the first dose after pregnancy.  Zoster vaccine.** / 1 dose for adults aged 90 years or older.  Measles, mumps, rubella (MMR) vaccine.** / You need at least 1 dose of MMR if you were born in 1956 or later. You may also need a 2nd dose. For females of childbearing age, rubella immunity should be determined. If there is no evidence of immunity, females who are not pregnant should be vaccinated. If there is no evidence of immunity, females who are pregnant should delay immunization until after pregnancy.  Pneumococcal 13-valent conjugate (PCV13) vaccine.** / Consult your health care provider.  Pneumococcal polysaccharide (PPSV23) vaccine.** / 1 to 2 doses if you smoke cigarettes or if you have certain conditions.  Meningococcal vaccine.** / Consult your health care provider.  Hepatitis A vaccine.** / Consult your health care provider.  Hepatitis B vaccine.** / Consult your health care provider.  Haemophilus influenzae type b (Hib) vaccine.** / Consult your health care provider. Ages 33 years and over  Blood pressure check.** / Every 1 to 2 years.  Lipid and cholesterol check.** / Every 5 years beginning at age 41 years.  Lung cancer screening. / Every year if you are aged 34-80 years and have a 30-pack-year history of smoking and currently smoke or have  quit within the past 15 years. Yearly screening is stopped once you have quit smoking for at least 15 years or develop a health problem that would prevent you from having lung cancer treatment.  Clinical breast exam.** / Every year after age 38 years.  BRCA-related cancer risk assessment.** / For women who have family members with a BRCA-related cancer (breast, ovarian, tubal, or peritoneal cancers).  Mammogram.** / Every year beginning at age 73 years and continuing for as long as you are in good health. Consult with your health care provider.  Pap test.** / Every 3 years starting at age 69 years through age 35 or 35 years with 3 consecutive normal Pap tests. Testing can be stopped between 65 and 70 years with 3 consecutive normal Pap tests and no abnormal Pap or HPV tests in the past 10 years.  HPV screening.** / Every 3 years from ages 61 years through ages 46 or 52 years with a history of 3 consecutive normal Pap tests. Testing can be stopped between 65 and 70 years with 3 consecutive normal Pap tests and no abnormal Pap or HPV tests in the past 10 years.  Fecal occult blood test (FOBT) of stool. / Every year beginning at age 19 years and continuing until age 54 years. You may not need to do this test if you get a colonoscopy every 10 years.  Flexible sigmoidoscopy or colonoscopy.** / Every 5 years for a flexible sigmoidoscopy or every 10 years for a colonoscopy beginning at age 104 years and continuing until age 3 years.  Hepatitis C blood test.** / For all people born from [birth year] through 1964 and any individual with known risks for hepatitis C.  Osteoporosis screening.** / A one-time screening for women ages 72 years and over and women at risk for fractures or osteoporosis.  Skin self-exam. / Monthly.  Influenza vaccine. / Every year.  Tetanus, diphtheria, and acellular pertussis (Tdap/Td) vaccine.** / 1 dose of Td every 10 years.  Varicella vaccine.** / Consult your health care  provider.  Zoster vaccine.** / 1 dose for adults aged 75 years or older.  Pneumococcal 13-valent conjugate (PCV13) vaccine.** / Consult your health care provider.  Pneumococcal polysaccharide (PPSV23) vaccine.** / 1 dose for all adults aged 36 years and older.  Meningococcal vaccine.** / Consult your health care provider.  Hepatitis A vaccine.** / Consult your health care provider.  Hepatitis B vaccine.** / Consult your health care provider.  Haemophilus influenzae type b (Hib) vaccine.** / Consult your health care provider. ** Family history and personal history of risk and conditions may change your health care provider's recommendations. Document Released: 04/22/2001 Document Revised: 07/11/2013 Document Reviewed: 07/22/2010 [facility] Patient Information 2015 [place], [place]. This information is not intended to replace advice given to you by your health care provider. Make sure you discuss any questions you have with your health care provider.

## 2014-04-07 ENCOUNTER — Encounter: Payer: Self-pay | Admitting: *Deleted

## 2014-04-09 ENCOUNTER — Encounter: Payer: Self-pay | Admitting: Family Medicine

## 2014-04-09 DIAGNOSIS — R319 Hematuria, unspecified: Secondary | ICD-10-CM

## 2014-04-09 HISTORY — DX: Hematuria, unspecified: R31.9

## 2014-04-09 NOTE — Assessment & Plan Note (Addendum)
Poor controlled. Encouraged heart healthy diet such as the DASH diet and exercise as tolerated. Increase Losartan

## 2014-04-09 NOTE — Assessment & Plan Note (Addendum)
Patient encouraged to maintain heart healthy diet, regular exercise, adequate sleep. Consider daily probiotics. Take medications as prescribed. Follows with GYN. Had colonoscopy in 2008 with Dr Jaci Carrel. Will need follow up surveillance in future.

## 2014-04-09 NOTE — Assessment & Plan Note (Signed)
Encouraged DASH diet, decrease po intake and increase exercise as tolerated. Needs 7-8 hours of sleep nightly. Avoid trans fats, eat small, frequent meals every 4-5 hours with lean proteins, complex carbs and healthy fats. Minimize simple carbs, GMO foods. 

## 2014-04-09 NOTE — Assessment & Plan Note (Signed)
Avoid offending foods, start probiotics. Do not eat large meals in late evening and consider raising head of bed.  

## 2014-04-09 NOTE — Progress Notes (Signed)
Patient ID: Sandra [name], female   DOB: 12-03-1960, 54 y.o.   MRN: [id number]   CAMMI CONSALVO  [id number] 10/16/1960 04/09/2014      Progress Note-Follow Up  Subjective  Chief Complaint  Chief Complaint  Patient presents with  . Annual Exam    non-fasting  . Pelvic Pain    with urinary burning (has been drinking cranberry)    HPI  Patient is a 54 y.o. female in today for routine medical care. Patient in for annual exam. No recent illness but is complaining urinary frequency and urgency with some mild abdominal discomfort. Is also noting some low back pain but this is not unusal. Follows with Dr Perlie Gold for GYN care. Denies CP/palp/SOB/HA/congestion/fevers/GI c/o. Taking meds as prescribed  Past Medical History  Diagnosis Date  . Hypertension   . Chest pain     PT SAW CARDIOLOGIST DR. Burt Knack - STRESS ECHO DONE NEGATIVE - NO PROBLEM SINCE  . Hyperlipidemia   . Rapid heart beat     PT FEELS PROB RELATED TO HYPER PARATHYROIDISM  . Chicken pox as achild  . Hoarseness 02/04/2012    RESOLVED - THOUGHT TO HAVE BEEN ALLERY RELATED  . Ocular migraine   . GERD (gastroesophageal reflux disease)   . Preventative health care 02/04/2012  . SUI (stress urinary incontinence, female) 02/04/2012    Sees Dr Perlie Gold and they have discussed a bladder tack but so far she declines  . Overactive bladder 05/31/2012  . Elevated LFTs 05/31/2012  . Obesity, unspecified 05/31/2012  . Allergic rhinitis 05/31/2012  . Cough 11/29/2012    RESOLVED - IT WAS CAUSED BY LISINOPRIL  . H/O tobacco use, presenting hazards to health 11/29/2012    Smoked 1 1/2 ppd for 20 years quit in roughtly 2007  . Hyperparathyroidism   . Hyperglycemia 04/06/2014  . Hematuria 04/09/2014    Past Surgical History  Procedure Laterality Date  . Pelvic laparoscopy  03-2004  . Wisdom tooth extraction  30 yrs ago  . Btl    . Tubal ligation    . Breast surgery      left breast excision of adenoma  . Parathyroidectomy  Right 08/12/2013    Procedure: right inferior frozen section PARATHYROIDECTOMY;  Surgeon: Earnstine Regal, MD;  Location: [facility];  Service: General;  Laterality: Right;    Family History  Problem Relation Age of Onset  . Hypertension Mother 5    alive  . Renal cancer Mother     renal  . Hypertension Father 54    alive  . Atrial fibrillation Father   . Colon cancer Father     colon  . Hypertension Brother 63    alive  . Hypertension Sister   . Heart disease Paternal Aunt   . Heart disease Paternal Uncle   . Lung cancer Maternal Grandfather     lung/ smoker  . Emphysema Maternal Grandfather   . Heart disease Paternal Grandmother   . Other Paternal Grandfather     black lung  . Heart disease Paternal Grandfather   . Hypertension Sister   . COPD Sister     History   Social History  . Marital Status: Married    Spouse Name: N/A    Number of Children: 2  . Years of Education: N/A   Occupational History  . RN   .  [employer]   Social History Main Topics  . Smoking status: Former Smoker -- 1.00 packs/day for 20 years  Types: Cigarettes    Quit date: 03/10/2005  . Smokeless tobacco: Never Used  . Alcohol Use: Yes     Comment: very rarely  . Drug Use: No  . Sexual Activity:    Partners: Male   Other Topics Concern  . Not on file   Social History Narrative    Current Outpatient Prescriptions on File Prior to Visit  Medication Sig Dispense Refill  . aspirin EC 81 MG tablet Take 81 mg by mouth at bedtime.    . Cholecalciferol (VITAMIN D PO) Take 2,000 mg by mouth daily. Vitamin D    . Coenzyme Q10 (CO Q 10) 100 MG CAPS Take 400 mg by mouth daily.    Marland Kitchen estradiol-norethindrone (ACTIVELLA) 1-0.5 MG per tablet Take 1 tablet by mouth daily.    . furosemide (LASIX) 20 MG tablet TAKE 1 TABLET (20 MG TOTAL) BY MOUTH DAILY. 90 tablet 0  . omeprazole (PRILOSEC) 40 MG capsule Take 1 capsule (40 mg total) by mouth daily. 90 capsule 1  . OVER THE COUNTER MEDICATION  Take 1 tablet by mouth daily. Icool- daily    . OVER THE COUNTER MEDICATION Take 1 capsule by mouth daily. Mega red- 1 capsule daily    . simvastatin (ZOCOR) 40 MG tablet TAKE 1 TABLET (40 MG TOTAL) BY MOUTH DAILY AT 6 PM. 90 tablet 0   No current facility-administered medications on file prior to visit.    Allergies  Allergen Reactions  . Codeine Nausea And Vomiting  . Penicillins     RASH, ITCHING    Review of Systems  Review of Systems  Constitutional: Negative for fever, chills and malaise/fatigue.  HENT: Negative for congestion, hearing loss and nosebleeds.   Eyes: Negative for discharge.  Respiratory: Negative for cough, sputum production, shortness of breath and wheezing.   Cardiovascular: Negative for chest pain, palpitations and leg swelling.  Gastrointestinal: Positive for abdominal pain. Negative for heartburn, nausea, vomiting, diarrhea, constipation and blood in stool.  Genitourinary: Positive for urgency and frequency. Negative for dysuria and hematuria.  Musculoskeletal: Negative for myalgias, back pain and falls.  Skin: Negative for rash.  Neurological: Negative for dizziness, tremors, sensory change, focal weakness, loss of consciousness, weakness and headaches.  Endo/Heme/Allergies: Negative for polydipsia. Does not bruise/bleed easily.  Psychiatric/Behavioral: Negative for depression and suicidal ideas. The patient is not nervous/anxious and does not have insomnia.     Objective  BP 155/98 mmHg  Pulse 87  Temp(Src) 98.3 F (36.8 C) (Oral)  Resp 16  Ht 5\' 5"  (1.651 m)  Wt 204 lb 3.2 oz (92.625 kg)  BMI 33.98 kg/m2  SpO2 96%  LMP 04/01/2011  Physical Exam  Physical Exam  Constitutional: She is oriented to person, place, and time and well-developed, well-nourished, and in no distress. No distress.  HENT:  Head: Normocephalic and atraumatic.  Right Ear: External ear normal.  Left Ear: External ear normal.  Nose: Nose normal.  Mouth/Throat:  Oropharynx is clear and moist. No oropharyngeal exudate.  Eyes: Conjunctivae are normal. Pupils are equal, round, and reactive to light. Right eye exhibits no discharge. Left eye exhibits no discharge. No scleral icterus.  Neck: Normal range of motion. Neck supple. No thyromegaly present.  Cardiovascular: Normal rate, regular rhythm, normal heart sounds and intact distal pulses.   No murmur heard. Pulmonary/Chest: Effort normal and breath sounds normal. No respiratory distress. She has no wheezes. She has no rales.  Abdominal: Soft. Bowel sounds are normal. She exhibits no distension and no mass.  There is no tenderness.  Musculoskeletal: Normal range of motion. She exhibits no edema or tenderness.  Lymphadenopathy:    She has no cervical adenopathy.  Neurological: She is alert and oriented to person, place, and time. She has normal reflexes. No cranial nerve deficit. Coordination normal.  Skin: Skin is warm and dry. No rash noted. She is not diaphoretic.  Psychiatric: Mood, memory and affect normal.    Lab Results  Component Value Date   TSH 3.49 04/04/2014   Lab Results  Component Value Date   WBC 6.3 04/04/2014   HGB 13.4 04/04/2014   HCT 40.2 04/04/2014   MCV 86.7 04/04/2014   PLT 302.0 04/04/2014   Lab Results  Component Value Date   CREATININE 0.75 04/04/2014   BUN 11 04/04/2014   NA 140 04/04/2014   K 3.9 04/04/2014   CL 104 04/04/2014   CO2 27 04/04/2014   Lab Results  Component Value Date   ALT 19 04/04/2014   AST 17 04/04/2014   ALKPHOS 69 04/04/2014   BILITOT 0.2 04/04/2014   Lab Results  Component Value Date   CHOL 168 04/04/2014   Lab Results  Component Value Date   HDL 43.10 04/04/2014   Lab Results  Component Value Date   LDLCALC 90 04/04/2014   Lab Results  Component Value Date   TRIG 175.0* 04/04/2014   Lab Results  Component Value Date   CHOLHDL 4 04/04/2014     Assessment & Plan  Hyperglycemia Sugar and Triglycerides still up some,  check hgba1c   Hypertension Poor controlled. Encouraged heart healthy diet such as the DASH diet and exercise as tolerated. Increase Losartan   GERD (gastroesophageal reflux disease) Avoid offending foods, start probiotics. Do not eat large meals in late evening and consider raising head of bed.    Hyperlipidemia Tolerating statin, encouraged heart healthy diet, avoid trans fats, minimize simple carbs and saturated fats. Increase exercise as tolerated   Obesity Encouraged DASH diet, decrease po intake and increase exercise as tolerated. Needs 7-8 hours of sleep nightly. Avoid trans fats, eat small, frequent meals every 4-5 hours with lean proteins, complex carbs and healthy fats. Minimize simple carbs, GMO foods.   Preventative health care Patient encouraged to maintain heart healthy diet, regular exercise, adequate sleep. Consider daily probiotics. Take medications as prescribed. Follows with GYN. Had colonoscopy in 2008 with Dr Jaci Carrel. Will need follow up surveillance in future.    Hematuria Repeat UA in 1-2 months, treated with Bactrim due to symptoms

## 2014-04-09 NOTE — Assessment & Plan Note (Signed)
Tolerating statin, encouraged heart healthy diet, avoid trans fats, minimize simple carbs and saturated fats. Increase exercise as tolerated 

## 2014-04-09 NOTE — Assessment & Plan Note (Signed)
Repeat UA in 1-2 months, treated with Bactrim due to symptoms

## 2014-04-11 ENCOUNTER — Telehealth: Payer: Self-pay | Admitting: *Deleted

## 2014-04-11 NOTE — Telephone Encounter (Signed)
Prior authorization for Omeprazole initiated. Awaiting determination from [facility]. JG//CMA

## 2014-04-13 NOTE — Telephone Encounter (Signed)
PA approved through 04/12/2015

## 2014-04-14 ENCOUNTER — Telehealth: Payer: Self-pay | Admitting: *Deleted

## 2014-04-14 NOTE — Telephone Encounter (Signed)
Called and spoke with the pt and informed her of the note below.  Pt verbalized understanding.  Pt stated that she always has blood in her urine.  New ordered placed and send.//AB/CMA

## 2014-04-14 NOTE — Telephone Encounter (Signed)
-----   Message from [name], MD sent at 04/09/2014  9:16 PM EST ----- Reviewing labs noted blood in urine but no other findings. Recommend repeat UA in 4-8 weeks. Please order and let her know

## 2014-04-29 ENCOUNTER — Other Ambulatory Visit: Payer: Self-pay | Admitting: Family Medicine

## 2014-04-30 ENCOUNTER — Other Ambulatory Visit: Payer: Self-pay | Admitting: Family Medicine

## 2014-05-04 ENCOUNTER — Ambulatory Visit (INDEPENDENT_AMBULATORY_CARE_PROVIDER_SITE_OTHER): Payer: 59

## 2014-05-04 VITALS — BP 125/84 | HR 100 | Wt 201.8 lb

## 2014-05-04 DIAGNOSIS — R319 Hematuria, unspecified: Secondary | ICD-10-CM

## 2014-05-04 DIAGNOSIS — Z0130 Encounter for examination of blood pressure without abnormal findings: Secondary | ICD-10-CM

## 2014-05-04 NOTE — Progress Notes (Signed)
BP much improved.    Urine was also collected as requested and sent to lab for UA/UC.    Pt also brought in a Healthcare Provider Screening Form to be completed. Form filled in and placed in Dr. Frederik Pear red folder for review and signature.

## 2014-05-04 NOTE — Progress Notes (Signed)
Pre visit review using our clinic review tool, if applicable. No additional management support is needed unless otherwise documented below in the visit note. 

## 2014-05-05 LAB — POCT URINALYSIS DIPSTICK
Bilirubin, UA: NEGATIVE
Glucose, UA: NEGATIVE
Ketones, UA: NEGATIVE
Leukocytes, UA: NEGATIVE
Nitrite, UA: NEGATIVE
Protein, UA: NEGATIVE
Spec Grav, UA: 1.025
Urobilinogen, UA: 2
pH, UA: 6

## 2014-05-05 NOTE — Addendum Note (Signed)
Addended by: Ewing Schlein on: 05/05/2014 01:02 PM   Modules accepted: Orders

## 2014-05-05 NOTE — Addendum Note (Signed)
Addended by: Ewing Schlein on: 05/05/2014 01:08 PM   Modules accepted: Orders

## 2014-05-06 LAB — URINE CULTURE
Colony Count: NO GROWTH
Organism ID, Bacteria: NO GROWTH

## 2014-05-10 ENCOUNTER — Telehealth: Payer: Self-pay | Admitting: *Deleted

## 2014-05-10 NOTE — Telephone Encounter (Signed)
Completed/signed biometric screening form faxed to [employer] at [phone] successfully. Copy sent for scanning and original mailed to patient. JG//CMA

## 2014-05-11 ENCOUNTER — Other Ambulatory Visit: Payer: Self-pay | Admitting: Family Medicine

## 2014-07-21 ENCOUNTER — Encounter: Payer: Self-pay | Admitting: Family Medicine

## 2014-07-26 ENCOUNTER — Other Ambulatory Visit: Payer: Self-pay | Admitting: Family Medicine

## 2014-10-02 ENCOUNTER — Encounter: Payer: Self-pay | Admitting: *Deleted

## 2014-10-05 ENCOUNTER — Encounter: Payer: Self-pay | Admitting: Family Medicine

## 2014-10-05 ENCOUNTER — Ambulatory Visit (INDEPENDENT_AMBULATORY_CARE_PROVIDER_SITE_OTHER): Payer: 59 | Admitting: Family Medicine

## 2014-10-05 VITALS — BP 126/78 | HR 88 | Temp 98.6°F | Ht 66.0 in | Wt 194.6 lb

## 2014-10-05 DIAGNOSIS — E669 Obesity, unspecified: Secondary | ICD-10-CM | POA: Diagnosis not present

## 2014-10-05 DIAGNOSIS — I10 Essential (primary) hypertension: Secondary | ICD-10-CM | POA: Diagnosis not present

## 2014-10-05 DIAGNOSIS — R7989 Other specified abnormal findings of blood chemistry: Secondary | ICD-10-CM

## 2014-10-05 DIAGNOSIS — R945 Abnormal results of liver function studies: Secondary | ICD-10-CM

## 2014-10-05 DIAGNOSIS — K219 Gastro-esophageal reflux disease without esophagitis: Secondary | ICD-10-CM | POA: Diagnosis not present

## 2014-10-05 DIAGNOSIS — R739 Hyperglycemia, unspecified: Secondary | ICD-10-CM | POA: Diagnosis not present

## 2014-10-05 LAB — COMPREHENSIVE METABOLIC PANEL
ALT: 24 U/L (ref 0–35)
AST: 19 U/L (ref 0–37)
Albumin: 4.1 g/dL (ref 3.5–5.2)
Alkaline Phosphatase: 66 U/L (ref 39–117)
BUN: 15 mg/dL (ref 6–23)
CO2: 29 mEq/L (ref 19–32)
Calcium: 9.5 mg/dL (ref 8.4–10.5)
Chloride: 103 mEq/L (ref 96–112)
Creatinine, Ser: 0.71 mg/dL (ref 0.40–1.20)
GFR: 91.63 mL/min (ref >60.00)
Glucose, Bld: 103 mg/dL — ABNORMAL HIGH (ref 70–99)
Potassium: 4.8 mEq/L (ref 3.5–5.1)
Sodium: 140 mEq/L (ref 135–145)
Total Bilirubin: 0.3 mg/dL (ref 0.2–1.2)
Total Protein: 7.2 g/dL (ref 6.0–8.3)

## 2014-10-05 LAB — CBC
HCT: 40.1 % (ref 36.0–46.0)
Hemoglobin: 13.4 g/dL (ref 12.0–15.0)
MCHC: 33.5 g/dL (ref 30.0–36.0)
MCV: 88.4 fl (ref 78.0–100.0)
Platelets: 276 10*3/uL (ref 150.0–400.0)
RBC: 4.54 Mil/uL (ref 3.87–5.11)
RDW: 14 % (ref 11.5–15.5)
WBC: 6.6 10*3/uL (ref 4.0–10.5)

## 2014-10-05 LAB — HEMOGLOBIN A1C: Hgb A1c MFr Bld: 5.8 % (ref 4.6–6.5)

## 2014-10-05 LAB — LIPID PANEL
Cholesterol: 198 mg/dL (ref 0–200)
HDL: 55.5 mg/dL (ref >39.00)
LDL Cholesterol: 115 mg/dL — ABNORMAL HIGH (ref 0–99)
NonHDL: 142.81
Total CHOL/HDL Ratio: 4
Triglycerides: 138 mg/dL (ref 0.0–149.0)
VLDL: 27.6 mg/dL (ref 0.0–40.0)

## 2014-10-05 LAB — TSH: TSH: 2.03 u[IU]/mL (ref 0.35–4.50)

## 2014-10-05 MED ORDER — SIMVASTATIN 40 MG PO TABS: ORAL_TABLET | ORAL | Status: DC

## 2014-10-05 NOTE — Assessment & Plan Note (Signed)
Encouraged DASH diet, decrease po intake and increase exercise as tolerated. Needs 7-8 hours of sleep nightly. Avoid trans fats, eat small, frequent meals every 4-5 hours with lean proteins, complex carbs and healthy fats. Minimize simple carbs 

## 2014-10-05 NOTE — Assessment & Plan Note (Signed)
Well controlled, no changes to meds. Encouraged heart healthy diet such as the DASH diet and exercise as tolerated.  °

## 2014-10-05 NOTE — Progress Notes (Signed)
Sandra [name]  [id number] 25-Dec-1960 10/05/2014      Progress Note-Follow Up  Subjective  Chief Complaint  Chief Complaint  Patient presents with  . Follow-up    HPI  Patient is a 54 y.o. female in today for routine medical care. Patient is feeling well. After her last visit she made some positive changes with decreased carbs, less fatty foods, smaller portions and increased exercise. She feels better with less heartburn and fatigue. No new complaints. No recent illness. Denies CP/palp/SOB/HA/congestion/fevers/GI or GU c/o. Taking meds as prescribed  Past Medical History  Diagnosis Date  . Hypertension   . Chest pain     PT SAW CARDIOLOGIST DR. Burt Knack - STRESS ECHO DONE NEGATIVE - NO PROBLEM SINCE  . Hyperlipidemia   . Rapid heart beat     PT FEELS PROB RELATED TO HYPER PARATHYROIDISM  . Chicken pox as achild  . Hoarseness 02/04/2012    RESOLVED - THOUGHT TO HAVE BEEN ALLERY RELATED  . Ocular migraine   . GERD (gastroesophageal reflux disease)   . Preventative health care 02/04/2012  . SUI (stress urinary incontinence, female) 02/04/2012    Sees Dr Perlie Gold and they have discussed a bladder tack but so far she declines  . Overactive bladder 05/31/2012  . Elevated LFTs 05/31/2012  . Obesity, unspecified 05/31/2012  . Allergic rhinitis 05/31/2012  . Cough 11/29/2012    RESOLVED - IT WAS CAUSED BY LISINOPRIL  . H/O tobacco use, presenting hazards to health 11/29/2012    Smoked 1 1/2 ppd for 20 years quit in roughtly 2007  . Hyperparathyroidism   . Hyperglycemia 04/06/2014  . Hematuria 04/09/2014    Past Surgical History  Procedure Laterality Date  . Pelvic laparoscopy  03-2004  . Wisdom tooth extraction  30 yrs ago  . Btl    . Tubal ligation    . Breast surgery      left breast excision of adenoma  . Parathyroidectomy Right 08/12/2013    Procedure: right inferior frozen section PARATHYROIDECTOMY;  Surgeon: Earnstine Regal, MD;  Location: [facility];  Service: General;   Laterality: Right;    Family History  Problem Relation Age of Onset  . Hypertension Mother 46    alive  . Renal cancer Mother     renal  . Hypertension Father 76    alive  . Atrial fibrillation Father   . Colon cancer Father     colon  . Hypertension Brother 109    alive  . Hypertension Sister   . Heart disease Paternal Aunt   . Heart disease Paternal Uncle   . Lung cancer Maternal Grandfather     lung/ smoker  . Emphysema Maternal Grandfather   . Heart disease Paternal Grandmother   . Other Paternal Grandfather     black lung  . Heart disease Paternal Grandfather   . Hypertension Sister   . COPD Sister     History   Social History  . Marital Status: Married    Spouse Name: N/A  . Number of Children: 2  . Years of Education: N/A   Occupational History  . RN   .  [employer]   Social History Main Topics  . Smoking status: Former Smoker -- 1.00 packs/day for 20 years    Types: Cigarettes    Quit date: 03/10/2005  . Smokeless tobacco: Never Used  . Alcohol Use: Yes     Comment: very rarely  . Drug Use: No  . Sexual Activity:  Partners: Male   Other Topics Concern  . Not on file   Social History Narrative    Current Outpatient Prescriptions on File Prior to Visit  Medication Sig Dispense Refill  . aspirin EC 81 MG tablet Take 81 mg by mouth at bedtime.    . Cholecalciferol (VITAMIN D PO) Take 2,000 mg by mouth daily. Vitamin D    . Coenzyme Q10 (CO Q 10) 100 MG CAPS Take 400 mg by mouth daily.    . furosemide (LASIX) 20 MG tablet TAKE 1 TABLET (20 MG TOTAL) BY MOUTH DAILY. 90 tablet 3  . losartan (COZAAR) 50 MG tablet TAKE 1 TABLET (50 MG TOTAL) BY MOUTH DAILY. 30 tablet 8  . omeprazole (PRILOSEC) 40 MG capsule TAKE 1 CAPSULE (40 MG TOTAL) BY MOUTH DAILY. 90 capsule 3  . OVER THE COUNTER MEDICATION Take 1 tablet by mouth daily. Icool- daily    . OVER THE COUNTER MEDICATION Take 1 capsule by mouth daily. Mega red- 1 capsule daily     No current  facility-administered medications on file prior to visit.    Allergies  Allergen Reactions  . Cefdinir Other (See Comments)    Severe Stomach cramps and diarrhea for several days.  . Codeine Nausea And Vomiting  . Penicillins     RASH, ITCHING    Review of Systems  Review of Systems  Constitutional: Negative for fever and malaise/fatigue.  HENT: Negative for congestion.   Eyes: Negative for discharge.  Respiratory: Negative for shortness of breath.   Cardiovascular: Negative for chest pain, palpitations and leg swelling.  Gastrointestinal: Negative for nausea, abdominal pain and diarrhea.  Genitourinary: Negative for dysuria.  Musculoskeletal: Negative for falls.  Skin: Negative for rash.  Neurological: Negative for loss of consciousness and headaches.  Endo/Heme/Allergies: Negative for polydipsia.  Psychiatric/Behavioral: Negative for depression and suicidal ideas. The patient is not nervous/anxious and does not have insomnia.     Objective  BP 132/88 mmHg  Pulse 88  Temp(Src) 98.6 F (37 C) (Oral)  Ht 5\' 6"  (1.676 m)  Wt 194 lb 9 oz (88.253 kg)  BMI 31.42 kg/m2  SpO2 96%  LMP 04/01/2011  Physical Exam  Physical Exam  Constitutional: She is oriented to person, place, and time and well-developed, well-nourished, and in no distress. No distress.  HENT:  Head: Normocephalic and atraumatic.  Eyes: Conjunctivae are normal.  Neck: Neck supple. No thyromegaly present.  Cardiovascular: Normal rate, regular rhythm and normal heart sounds.   No murmur heard. Pulmonary/Chest: Effort normal and breath sounds normal. She has no wheezes.  Abdominal: She exhibits no distension and no mass.  Musculoskeletal: She exhibits no edema.  Lymphadenopathy:    She has no cervical adenopathy.  Neurological: She is alert and oriented to person, place, and time.  Skin: Skin is warm and dry. No rash noted. She is not diaphoretic.  Psychiatric: Memory, affect and judgment normal.     Lab Results  Component Value Date   TSH 3.49 04/04/2014   Lab Results  Component Value Date   WBC 6.3 04/04/2014   HGB 13.4 04/04/2014   HCT 40.2 04/04/2014   MCV 86.7 04/04/2014   PLT 302.0 04/04/2014   Lab Results  Component Value Date   CREATININE 0.75 04/04/2014   BUN 11 04/04/2014   NA 140 04/04/2014   K 3.9 04/04/2014   CL 104 04/04/2014   CO2 27 04/04/2014   Lab Results  Component Value Date   ALT 19 04/04/2014  AST 17 04/04/2014   ALKPHOS 69 04/04/2014   BILITOT 0.2 04/04/2014   Lab Results  Component Value Date   CHOL 168 04/04/2014   Lab Results  Component Value Date   HDL 43.10 04/04/2014   Lab Results  Component Value Date   LDLCALC 90 04/04/2014   Lab Results  Component Value Date   TRIG 175.0* 04/04/2014   Lab Results  Component Value Date   CHOLHDL 4 04/04/2014     Assessment & Plan  Hypertension Well controlled, no changes to meds. Encouraged heart healthy diet such as the DASH diet and exercise as tolerated.   GERD (gastroesophageal reflux disease) Avoid offending foods, start probiotics. Do not eat large meals in late evening and consider raising head of bed.   Obesity Encouraged DASH diet, decrease po intake and increase exercise as tolerated. Needs 7-8 hours of sleep nightly. Avoid trans fats, eat small, frequent meals every 4-5 hours with lean proteins, complex carbs and healthy fats. Minimize simple carbs  Hyperglycemia hgba1c acceptable, minimize simple carbs. Increase exercise as tolerated.

## 2014-10-05 NOTE — Assessment & Plan Note (Signed)
Avoid offending foods, start probiotics. Do not eat large meals in late evening and consider raising head of bed.  

## 2014-10-05 NOTE — Patient Instructions (Signed)

## 2014-10-05 NOTE — Assessment & Plan Note (Signed)
hgba1c acceptable, minimize simple carbs. Increase exercise as tolerated.  

## 2015-01-16 ENCOUNTER — Other Ambulatory Visit: Payer: Self-pay | Admitting: Family Medicine

## 2015-01-17 ENCOUNTER — Other Ambulatory Visit: Payer: Self-pay | Admitting: Family Medicine

## 2015-01-31 ENCOUNTER — Other Ambulatory Visit: Payer: Self-pay | Admitting: Family Medicine

## 2015-03-08 ENCOUNTER — Other Ambulatory Visit: Payer: Self-pay | Admitting: Family Medicine

## 2015-03-26 ENCOUNTER — Encounter: Payer: Self-pay | Admitting: Physician Assistant

## 2015-03-26 ENCOUNTER — Ambulatory Visit (INDEPENDENT_AMBULATORY_CARE_PROVIDER_SITE_OTHER): Payer: 59 | Admitting: Physician Assistant

## 2015-03-26 VITALS — BP 130/74 | HR 89 | Temp 98.1°F | Ht 66.0 in | Wt 196.8 lb

## 2015-03-26 DIAGNOSIS — J019 Acute sinusitis, unspecified: Secondary | ICD-10-CM | POA: Diagnosis not present

## 2015-03-26 DIAGNOSIS — B9689 Other specified bacterial agents as the cause of diseases classified elsewhere: Secondary | ICD-10-CM

## 2015-03-26 MED ORDER — DOXYCYCLINE HYCLATE 100 MG PO CAPS: 100.0000 mg | ORAL_CAPSULE | Freq: Two times a day (BID) | ORAL | Status: DC

## 2015-03-26 MED ORDER — BENZONATATE 200 MG PO CAPS: 200.0000 mg | ORAL_CAPSULE | Freq: Three times a day (TID) | ORAL | Status: DC | PRN

## 2015-03-26 NOTE — Progress Notes (Signed)
Pre visit review using our clinic review tool, if applicable. No additional management support is needed unless otherwise documented below in the visit note. 

## 2015-03-26 NOTE — Progress Notes (Signed)
Patient presents to clinic today c/o 2 weeks of sinus pressure, sinus pain with headache and facial pain, PND with scratchy throat and intermittent fevers. Denies chest pain or SOB but has noted chest congestion over the past couple of days with cough. Denies recent travel or sick contact.   Past Medical History  Diagnosis Date  . Hypertension   . Chest pain     PT SAW CARDIOLOGIST DR. Burt Knack - STRESS ECHO DONE NEGATIVE - NO PROBLEM SINCE  . Hyperlipidemia   . Rapid heart beat     PT FEELS PROB RELATED TO HYPER PARATHYROIDISM  . Chicken pox as achild  . Hoarseness 02/04/2012    RESOLVED - THOUGHT TO HAVE BEEN ALLERY RELATED  . Ocular migraine   . GERD (gastroesophageal reflux disease)   . Preventative health care 02/04/2012  . SUI (stress urinary incontinence, female) 02/04/2012    Sees Dr Perlie Gold and they have discussed a bladder tack but so far she declines  . Overactive bladder 05/31/2012  . Elevated LFTs 05/31/2012  . Obesity, unspecified 05/31/2012  . Allergic rhinitis 05/31/2012  . Cough 11/29/2012    RESOLVED - IT WAS CAUSED BY LISINOPRIL  . H/O tobacco use, presenting hazards to health 11/29/2012    Smoked 1 1/2 ppd for 20 years quit in roughtly 2007  . Hyperparathyroidism ([place])   . Hyperglycemia 04/06/2014  . Hematuria 04/09/2014    Current Outpatient Prescriptions on File Prior to Visit  Medication Sig Dispense Refill  . aspirin EC 81 MG tablet Take 81 mg by mouth at bedtime.    . Cholecalciferol (VITAMIN D PO) Take 2,000 mg by mouth daily. Vitamin D    . Coenzyme Q10 (CO Q 10) 100 MG CAPS Take 400 mg by mouth daily.    . furosemide (LASIX) 20 MG tablet TAKE 1 TABLET (20 MG TOTAL) BY MOUTH DAILY. 90 tablet 3  . losartan (COZAAR) 50 MG tablet TAKE 1 TABLET BY MOUTH EVERY DAY 30 tablet 6  . omeprazole (PRILOSEC) 40 MG capsule TAKE 1 CAPSULE (40 MG TOTAL) BY MOUTH DAILY. 90 capsule 0  . OVER THE COUNTER MEDICATION Take 1 tablet by mouth daily. Icool- daily    . OVER THE  COUNTER MEDICATION Take 1 capsule by mouth daily. Mega red- 1 capsule daily    . simvastatin (ZOCOR) 40 MG tablet TAKE 1 TABLET (40 MG TOTAL) BY MOUTH DAILY AT 6 PM. 90 tablet 0   No current facility-administered medications on file prior to visit.    Allergies  Allergen Reactions  . Cefdinir Other (See Comments)    Severe Stomach cramps and diarrhea for several days.  . Codeine Nausea And Vomiting  . Penicillins     RASH, ITCHING    Family History  Problem Relation Age of Onset  . Hypertension Mother 45    alive  . Renal cancer Mother     renal  . Hypertension Father 58    alive  . Atrial fibrillation Father   . Colon cancer Father     colon  . Hypertension Brother 48    alive  . Hypertension Sister   . Heart disease Paternal Aunt   . Heart disease Paternal Uncle   . Lung cancer Maternal Grandfather     lung/ smoker  . Emphysema Maternal Grandfather   . Heart disease Paternal Grandmother   . Other Paternal Grandfather     black lung  . Heart disease Paternal Grandfather   . Hypertension Sister   .  COPD Sister     Social History   Social History  . Marital Status: Married    Spouse Name: N/A  . Number of Children: 2  . Years of Education: N/A   Occupational History  . RN   .  [employer]   Social History Main Topics  . Smoking status: Former Smoker -- 1.00 packs/day for 20 years    Types: Cigarettes    Quit date: 03/10/2005  . Smokeless tobacco: Never Used  . Alcohol Use: Yes     Comment: very rarely  . Drug Use: No  . Sexual Activity:    Partners: Male   Other Topics Concern  . None   Social History Narrative   Review of Systems - See HPI.  All other ROS are negative.  BP 130/74 mmHg  Pulse 89  Temp(Src) 98.1 F (36.7 C) (Oral)  Ht 5\' 6"  (1.676 m)  Wt 196 lb 12.8 oz (89.268 kg)  BMI 31.78 kg/m2  SpO2 97%  LMP 04/01/2011  Physical Exam  Constitutional: She is oriented to person, place, and time and well-developed,  well-nourished, and in no distress.  HENT:  Head: Normocephalic and atraumatic.  Right Ear: Tympanic membrane normal.  Left Ear: Tympanic membrane normal.  Nose: Right sinus exhibits frontal sinus tenderness. Left sinus exhibits frontal sinus tenderness.  Mouth/Throat: Uvula is midline, oropharynx is clear and moist and mucous membranes are normal.  Eyes: Conjunctivae are normal.  Neck: Neck supple.  Cardiovascular: Normal rate, regular rhythm, normal heart sounds and intact distal pulses.   Pulmonary/Chest: Effort normal and breath sounds normal. No respiratory distress. She has no wheezes. She has no rales. She exhibits no tenderness.  Neurological: She is alert and oriented to person, place, and time.  Skin: Skin is warm and dry. No rash noted.  Psychiatric: Affect normal.  Vitals reviewed.   No results found for this or any previous visit (from the past 2160 hour(s)).  Assessment/Plan: Acute bacterial sinusitis Rx Doxycycline.  Increase fluids.  Rest.  Saline nasal spray.  Probiotic.  Mucinex as directed.  Humidifier in bedroom. Tessalon per orders.  Call or return to clinic if symptoms are not improving.

## 2015-03-26 NOTE — Patient Instructions (Signed)
Please take antibiotic as directed.  Increase fluid intake.  Use Saline nasal spray.  Take a daily multivitamin.  Place a humidifier in the bedroom.  Please call or return clinic if symptoms are not improving.  Sinusitis Sinusitis is redness, soreness, and swelling (inflammation) of the paranasal sinuses. Paranasal sinuses are air pockets within the bones of your face (beneath the eyes, the middle of the forehead, or above the eyes). In healthy paranasal sinuses, mucus is able to drain out, and air is able to circulate through them by way of your nose. However, when your paranasal sinuses are inflamed, mucus and air can become trapped. This can allow bacteria and other germs to grow and cause infection. Sinusitis can develop quickly and last only a short time (acute) or continue over a long period (chronic). Sinusitis that lasts for more than 12 weeks is considered chronic.  CAUSES  Causes of sinusitis include:  Allergies.  Structural abnormalities, such as displacement of the cartilage that separates your nostrils (deviated septum), which can decrease the air flow through your nose and sinuses and affect sinus drainage.  Functional abnormalities, such as when the small hairs (cilia) that line your sinuses and help remove mucus do not work properly or are not present. SYMPTOMS  Symptoms of acute and chronic sinusitis are the same. The primary symptoms are pain and pressure around the affected sinuses. Other symptoms include:  Upper toothache.  Earache.  Headache.  Bad breath.  Decreased sense of smell and taste.  A cough, which worsens when you are lying flat.  Fatigue.  Fever.  Thick drainage from your nose, which often is green and may contain pus (purulent).  Swelling and warmth over the affected sinuses. DIAGNOSIS  Your caregiver will perform a physical exam. During the exam, your caregiver may:  Look in your nose for signs of abnormal growths in your nostrils (nasal  polyps).  Tap over the affected sinus to check for signs of infection.  View the inside of your sinuses (endoscopy) with a special imaging device with a light attached (endoscope), which is inserted into your sinuses. If your caregiver suspects that you have chronic sinusitis, one or more of the following tests may be recommended:  Allergy tests.  Nasal culture A sample of mucus is taken from your nose and sent to a lab and screened for bacteria.  Nasal cytology A sample of mucus is taken from your nose and examined by your caregiver to determine if your sinusitis is related to an allergy. TREATMENT  Most cases of acute sinusitis are related to a viral infection and will resolve on their own within 10 days. Sometimes medicines are prescribed to help relieve symptoms (pain medicine, decongestants, nasal steroid sprays, or saline sprays).  However, for sinusitis related to a bacterial infection, your caregiver will prescribe antibiotic medicines. These are medicines that will help kill the bacteria causing the infection.  Rarely, sinusitis is caused by a fungal infection. In theses cases, your caregiver will prescribe antifungal medicine. For some cases of chronic sinusitis, surgery is needed. Generally, these are cases in which sinusitis recurs more than 3 times per year, despite other treatments. HOME CARE INSTRUCTIONS   Drink plenty of water. Water helps thin the mucus so your sinuses can drain more easily.  Use a humidifier.  Inhale steam 3 to 4 times a day (for example, sit in the bathroom with the shower running).  Apply a warm, moist washcloth to your face 3 to 4 times a day,  or as directed by your caregiver.  Use saline nasal sprays to help moisten and clean your sinuses.  Take over-the-counter or prescription medicines for pain, discomfort, or fever only as directed by your caregiver. SEEK IMMEDIATE MEDICAL CARE IF:  You have increasing pain or severe headaches.  You have  nausea, vomiting, or drowsiness.  You have swelling around your face.  You have vision problems.  You have a stiff neck.  You have difficulty breathing. MAKE SURE YOU:   Understand these instructions.  Will watch your condition.  Will get help right away if you are not doing well or get worse. Document Released: 02/24/2005 Document Revised: 05/19/2011 Document Reviewed: 03/11/2011 [facility] Patient Information 2014 [place], [place].

## 2015-03-26 NOTE — Assessment & Plan Note (Signed)
Rx Doxycycline.  Increase fluids.  Rest.  Saline nasal spray.  Probiotic.  Mucinex as directed.  Humidifier in bedroom. Tessalon per orders.  Call or return to clinic if symptoms are not improving.  

## 2015-04-12 ENCOUNTER — Telehealth: Payer: Self-pay | Admitting: *Deleted

## 2015-04-12 ENCOUNTER — Other Ambulatory Visit: Payer: Self-pay | Admitting: Family Medicine

## 2015-04-12 ENCOUNTER — Encounter: Payer: Self-pay | Admitting: *Deleted

## 2015-04-12 MED ORDER — SIMVASTATIN 40 MG PO TABS: ORAL_TABLET | ORAL | Status: DC

## 2015-04-12 MED ORDER — FUROSEMIDE 20 MG PO TABS: ORAL_TABLET | ORAL | Status: DC

## 2015-04-12 MED ORDER — LOSARTAN POTASSIUM 50 MG PO TABS: 50.0000 mg | ORAL_TABLET | Freq: Every day | ORAL | Status: DC

## 2015-04-12 MED ORDER — OMEPRAZOLE 40 MG PO CPDR: DELAYED_RELEASE_CAPSULE | ORAL | Status: DC

## 2015-04-12 NOTE — Telephone Encounter (Signed)
Pre-Visit Call completed with patient and chart updated.   Pre-Visit Info documented in Specialty Comments under [employer].    

## 2015-04-12 NOTE — Addendum Note (Signed)
Addended by: Dorrene German on: 04/12/2015 12:12 PM   Modules accepted: Medications

## 2015-04-13 ENCOUNTER — Telehealth: Payer: Self-pay | Admitting: Family Medicine

## 2015-04-13 ENCOUNTER — Encounter: Payer: 59 | Admitting: Family Medicine

## 2015-04-13 DIAGNOSIS — I10 Essential (primary) hypertension: Secondary | ICD-10-CM

## 2015-04-13 DIAGNOSIS — E785 Hyperlipidemia, unspecified: Secondary | ICD-10-CM

## 2015-04-13 NOTE — Telephone Encounter (Signed)
Dr. Charlett Blake she scheduled for August 1. Do you want to work her in before then?

## 2015-04-13 NOTE — Telephone Encounter (Signed)
No charge and if we have trouble finding her a spo let me know and I will meet her early one morning.

## 2015-04-13 NOTE — Telephone Encounter (Signed)
Patient called twice this morning before her scheduled appointment stating that she was in traffic due to an [place] [id number]. She finally called and stated that she could not make it on time and she scheduled for another time. CHARGE or NO CHARGE?

## 2015-04-14 NOTE — Telephone Encounter (Signed)
Please check with her, if she is doing well without concerns and wants to wait til august that is fine, if we just do some labs to review. If she has concerns and wants to be seen sooner I will work that out. If she agrees to labs would do a lipid, cmp, cbc and tsh for HTN and hyperlipidemia and I will let her know the results

## 2015-04-16 NOTE — Addendum Note (Signed)
Addended by: Sharon Seller B on: 04/16/2015 01:22 PM   Modules accepted: Orders

## 2015-04-16 NOTE — Telephone Encounter (Signed)
Let patient know labs are ordered just schedule her a lab appointment and Dr. Charlett Blake can complete her form, thanks,

## 2015-04-16 NOTE — Telephone Encounter (Signed)
That is fine I can do it off of last visit as long as that is acceptable to her work

## 2015-04-16 NOTE — Telephone Encounter (Signed)
I have put an order in for her labs to be done, she is ok to wait until her appt. To see you, but does have a screening form from her insurance to sign,  Advise if ok to do once labs are completed.

## 2015-04-16 NOTE — Telephone Encounter (Signed)
Patient states she is doing fine and will come in for labs. She also states that she needs Dr. Charlett Blake to sign her Screening form for her insurance. Will call patient back if labs are ordered to schedule appt

## 2015-04-17 NOTE — Telephone Encounter (Signed)
Left message for patient to call and schedule appointment for Labs

## 2015-04-18 ENCOUNTER — Other Ambulatory Visit (INDEPENDENT_AMBULATORY_CARE_PROVIDER_SITE_OTHER): Payer: 59

## 2015-04-18 DIAGNOSIS — E785 Hyperlipidemia, unspecified: Secondary | ICD-10-CM | POA: Diagnosis not present

## 2015-04-18 DIAGNOSIS — I10 Essential (primary) hypertension: Secondary | ICD-10-CM | POA: Diagnosis not present

## 2015-04-18 LAB — COMPREHENSIVE METABOLIC PANEL
ALT: 32 U/L (ref 0–35)
AST: 23 U/L (ref 0–37)
Albumin: 4.4 g/dL (ref 3.5–5.2)
Alkaline Phosphatase: 71 U/L (ref 39–117)
BUN: 13 mg/dL (ref 6–23)
CO2: 32 mEq/L (ref 19–32)
Calcium: 9.9 mg/dL (ref 8.4–10.5)
Chloride: 102 mEq/L (ref 96–112)
Creatinine, Ser: 0.69 mg/dL (ref 0.40–1.20)
GFR: 94.51 mL/min (ref >60.00)
Glucose, Bld: 98 mg/dL (ref 70–99)
Potassium: 4.1 mEq/L (ref 3.5–5.1)
Sodium: 141 mEq/L (ref 135–145)
Total Bilirubin: 0.5 mg/dL (ref 0.2–1.2)
Total Protein: 7.4 g/dL (ref 6.0–8.3)

## 2015-04-18 LAB — LIPID PANEL
Cholesterol: 195 mg/dL (ref 0–200)
HDL: 51.5 mg/dL (ref >39.00)
LDL Cholesterol: 119 mg/dL — ABNORMAL HIGH (ref 0–99)
NonHDL: 143.78
Total CHOL/HDL Ratio: 4
Triglycerides: 126 mg/dL (ref 0.0–149.0)
VLDL: 25.2 mg/dL (ref 0.0–40.0)

## 2015-04-18 LAB — CBC WITH DIFFERENTIAL/PLATELET
Basophils Absolute: 0 10*3/uL (ref 0.0–0.1)
Basophils Relative: 0.6 % (ref 0.0–3.0)
Eosinophils Absolute: 0 10*3/uL (ref 0.0–0.7)
Eosinophils Relative: 1 % (ref 0.0–5.0)
HCT: 39.8 % (ref 36.0–46.0)
Hemoglobin: 13.2 g/dL (ref 12.0–15.0)
Lymphocytes Relative: 43 % (ref 12.0–46.0)
Lymphs Abs: 2.1 10*3/uL (ref 0.7–4.0)
MCHC: 33 g/dL (ref 30.0–36.0)
MCV: 87.8 fl (ref 78.0–100.0)
Monocytes Absolute: 0.4 10*3/uL (ref 0.1–1.0)
Monocytes Relative: 9.1 % (ref 3.0–12.0)
Neutro Abs: 2.2 10*3/uL (ref 1.4–7.7)
Neutrophils Relative %: 46.3 % (ref 43.0–77.0)
Platelets: 247 10*3/uL (ref 150.0–400.0)
RBC: 4.54 Mil/uL (ref 3.87–5.11)
RDW: 13.9 % (ref 11.5–15.5)
WBC: 4.8 10*3/uL (ref 4.0–10.5)

## 2015-04-18 LAB — TSH: TSH: 3.25 u[IU]/mL (ref 0.35–4.50)

## 2015-04-22 ENCOUNTER — Other Ambulatory Visit: Payer: Self-pay | Admitting: Family Medicine

## 2015-06-06 ENCOUNTER — Telehealth: Payer: Self-pay

## 2015-06-06 NOTE — Telephone Encounter (Signed)
Pre VIsit call completed with patient. 

## 2015-06-07 ENCOUNTER — Ambulatory Visit (INDEPENDENT_AMBULATORY_CARE_PROVIDER_SITE_OTHER): Payer: 59 | Admitting: Family Medicine

## 2015-06-07 ENCOUNTER — Encounter: Payer: Self-pay | Admitting: Family Medicine

## 2015-06-07 VITALS — BP 138/88 | HR 91 | Temp 98.4°F | Ht 65.0 in | Wt 183.0 lb

## 2015-06-07 DIAGNOSIS — Z23 Encounter for immunization: Secondary | ICD-10-CM | POA: Diagnosis not present

## 2015-06-07 DIAGNOSIS — E785 Hyperlipidemia, unspecified: Secondary | ICD-10-CM

## 2015-06-07 DIAGNOSIS — E210 Primary hyperparathyroidism: Secondary | ICD-10-CM | POA: Diagnosis not present

## 2015-06-07 DIAGNOSIS — I10 Essential (primary) hypertension: Secondary | ICD-10-CM

## 2015-06-07 DIAGNOSIS — R739 Hyperglycemia, unspecified: Secondary | ICD-10-CM

## 2015-06-07 DIAGNOSIS — Z0000 Encounter for general adult medical examination without abnormal findings: Secondary | ICD-10-CM

## 2015-06-07 DIAGNOSIS — E669 Obesity, unspecified: Secondary | ICD-10-CM | POA: Diagnosis not present

## 2015-06-07 NOTE — Progress Notes (Signed)
Subjective:    Patient ID: Sandra [name], female    DOB: 01-02-1961, 55 y.o.   MRN: [id number]  Chief Complaint  Patient presents with  . Annual Exam    HPI Patient is in today for Annual Physical Exam. Patient reports some joint pain in arms that she reports is caused by simvastatin, patient stopped medication and did not have any joint pain.  Denies CP/palp/SOB/HA/congestion/fevers/GI or GU c/o. Taking meds as prescribed. No recent hospitalization.      Past Medical History  Diagnosis Date  . Hypertension   . Chest pain     PT SAW CARDIOLOGIST DR. Burt Knack - STRESS ECHO DONE NEGATIVE - NO PROBLEM SINCE  . Hyperlipidemia   . Rapid heart beat     PT FEELS PROB RELATED TO HYPER PARATHYROIDISM  . Chicken pox as achild  . Hoarseness 02/04/2012    RESOLVED - THOUGHT TO HAVE BEEN ALLERY RELATED  . Ocular migraine   . GERD (gastroesophageal reflux disease)   . Preventative health care 02/04/2012  . SUI (stress urinary incontinence, female) 02/04/2012    Sees Dr Perlie Gold and they have discussed a bladder tack but so far she declines  . Overactive bladder 05/31/2012  . Elevated LFTs 05/31/2012  . Obesity, unspecified 05/31/2012  . Allergic rhinitis 05/31/2012  . Cough 11/29/2012    RESOLVED - IT WAS CAUSED BY LISINOPRIL  . H/O tobacco use, presenting hazards to health 11/29/2012    Smoked 1 1/2 ppd for 20 years quit in roughtly 2007  . Hyperparathyroidism ([place])   . Hyperglycemia 04/06/2014  . Hematuria 04/09/2014    Past Surgical History  Procedure Laterality Date  . Pelvic laparoscopy  03-2004  . Wisdom tooth extraction  30 yrs ago  . Btl    . Tubal ligation    . Breast surgery      left breast excision of adenoma  . Parathyroidectomy Right 08/12/2013    Procedure: right inferior frozen section PARATHYROIDECTOMY;  Surgeon: Earnstine Regal, MD;  Location: [facility];  Service: General;  Laterality: Right;    Family History  Problem Relation Age of Onset  . Hypertension Mother 18     alive  . Renal cancer Mother     renal  . Hypertension Father 46    alive  . Atrial fibrillation Father   . Colon cancer Father     colon  . Hyperlipidemia Father   . Hypertension Brother 58    alive  . Hypertension Sister   . Heart disease Paternal Aunt   . Heart disease Paternal Uncle   . Lung cancer Maternal Grandfather     lung/ smoker  . Emphysema Maternal Grandfather   . Heart disease Paternal Grandmother   . Other Paternal Grandfather     black lung  . Heart disease Paternal Grandfather   . Hypertension Sister   . COPD Sister     Social History   Social History  . Marital Status: Married    Spouse Name: N/A  . Number of Children: 2  . Years of Education: N/A   Occupational History  . RN   .  [employer]   Social History Main Topics  . Smoking status: Former Smoker -- 1.00 packs/day for 20 years    Types: Cigarettes    Quit date: 03/10/2005  . Smokeless tobacco: Never Used  . Alcohol Use: Yes     Comment: very rarely  . Drug Use: No  . Sexual Activity:  Partners: Male   Other Topics Concern  . Not on file   Social History Narrative    Outpatient Prescriptions Prior to Visit  Medication Sig Dispense Refill  . aspirin EC 81 MG tablet Take 81 mg by mouth at bedtime.    . Cholecalciferol (VITAMIN D PO) Take 2,000 mg by mouth daily. Vitamin D    . Coenzyme Q10 (CO Q 10) 100 MG CAPS Take 400 mg by mouth daily.    . furosemide (LASIX) 20 MG tablet TAKE 1 TABLET (20 MG TOTAL) BY MOUTH DAILY. 90 tablet 2  . losartan (COZAAR) 50 MG tablet Take 1 tablet (50 mg total) by mouth daily. 90 tablet 2  . omeprazole (PRILOSEC) 40 MG capsule TAKE 1 CAPSULE (40 MG TOTAL) BY MOUTH DAILY. 90 capsule 2  . OVER THE COUNTER MEDICATION Take 1 capsule by mouth daily. Mega red- 1 capsule daily    . simvastatin (ZOCOR) 40 MG tablet TAKE 1 TABLET (40 MG TOTAL) BY MOUTH DAILY AT 6 PM. 90 tablet 2   No facility-administered medications prior to visit.    Allergies   Allergen Reactions  . Cefdinir Other (See Comments)    Severe Stomach cramps and diarrhea for several days.  . Codeine Nausea And Vomiting  . Penicillins     RASH, ITCHING    Review of Systems  Constitutional: Negative for fever and malaise/fatigue.  HENT: Negative for congestion.   Eyes: Negative for blurred vision.  Respiratory: Negative for shortness of breath.   Cardiovascular: Negative for chest pain, palpitations and leg swelling.  Gastrointestinal: Negative for nausea, abdominal pain and blood in stool.  Genitourinary: Negative for dysuria and frequency.  Musculoskeletal: Negative for falls.  Skin: Negative for rash.  Neurological: Negative for dizziness, loss of consciousness and headaches.  Endo/Heme/Allergies: Negative for environmental allergies.  Psychiatric/Behavioral: Negative for depression. The patient is not nervous/anxious.        Objective:    Physical Exam  Constitutional: She is oriented to person, place, and time. She appears well-developed and well-nourished. No distress.  HENT:  Head: Normocephalic and atraumatic.  Eyes: Conjunctivae are normal.  Neck: Neck supple. No thyromegaly present.  Cardiovascular: Normal rate, regular rhythm and normal heart sounds.   No murmur heard. Pulmonary/Chest: Effort normal and breath sounds normal. No respiratory distress.  Abdominal: Soft. Bowel sounds are normal. She exhibits no distension and no mass. There is no tenderness.  Musculoskeletal: She exhibits no edema.  Lymphadenopathy:    She has no cervical adenopathy.  Neurological: She is alert and oriented to person, place, and time.  Skin: Skin is warm and dry.  Psychiatric: She has a normal mood and affect. Her behavior is normal.    BP 138/88 mmHg  Pulse 91  Temp(Src) 98.4 F (36.9 C) (Oral)  Ht 5\' 5"  (1.651 m)  Wt 183 lb (83.008 kg)  BMI 30.45 kg/m2  SpO2 95%  LMP 04/01/2011 Wt Readings from Last 3 Encounters:  06/07/15 183 lb (83.008 kg)    03/26/15 196 lb 12.8 oz (89.268 kg)  10/05/14 194 lb 9 oz (88.253 kg)     Lab Results  Component Value Date   WBC 4.8 04/18/2015   HGB 13.2 04/18/2015   HCT 39.8 04/18/2015   PLT 247.0 04/18/2015   GLUCOSE 98 04/18/2015   CHOL 195 04/18/2015   TRIG 126.0 04/18/2015   HDL 51.50 04/18/2015   LDLDIRECT 120.1 02/04/2012   LDLCALC 119* 04/18/2015   ALT 32 04/18/2015   AST 23  04/18/2015   NA 141 04/18/2015   K 4.1 04/18/2015   CL 102 04/18/2015   CREATININE 0.69 04/18/2015   BUN 13 04/18/2015   CO2 32 04/18/2015   TSH 3.25 04/18/2015   HGBA1C 5.8 10/05/2014    Lab Results  Component Value Date   TSH 3.25 04/18/2015   Lab Results  Component Value Date   WBC 4.8 04/18/2015   HGB 13.2 04/18/2015   HCT 39.8 04/18/2015   MCV 87.8 04/18/2015   PLT 247.0 04/18/2015   Lab Results  Component Value Date   NA 141 04/18/2015   K 4.1 04/18/2015   CO2 32 04/18/2015   GLUCOSE 98 04/18/2015   BUN 13 04/18/2015   CREATININE 0.69 04/18/2015   BILITOT 0.5 04/18/2015   ALKPHOS 71 04/18/2015   AST 23 04/18/2015   ALT 32 04/18/2015   PROT 7.4 04/18/2015   ALBUMIN 4.4 04/18/2015   CALCIUM 9.9 04/18/2015   GFR 94.51 04/18/2015   Lab Results  Component Value Date   CHOL 195 04/18/2015   Lab Results  Component Value Date   HDL 51.50 04/18/2015   Lab Results  Component Value Date   LDLCALC 119* 04/18/2015   Lab Results  Component Value Date   TRIG 126.0 04/18/2015   Lab Results  Component Value Date   CHOLHDL 4 04/18/2015   Lab Results  Component Value Date   HGBA1C 5.8 10/05/2014       Assessment & Plan:   Problem List Items Addressed This Visit    Hyperglycemia    minimize simple carbs. Increase exercise as tolerated.      Relevant Orders   CBC   TSH   Lipid panel   Comprehensive metabolic panel   Hemoglobin A1c   Hyperlipidemia   Relevant Orders   CBC   TSH   Lipid panel   Comprehensive metabolic panel   Hemoglobin A1c   Lipid panel    Hyperparathyroidism, primary (HCC)   Relevant Orders   CBC   TSH   Lipid panel   Comprehensive metabolic panel   Hemoglobin A1c   Hypertension    Well controlled, no changes to meds. Encouraged heart healthy diet such as the DASH diet and exercise as tolerated.       Relevant Orders   CBC   TSH   Lipid panel   Comprehensive metabolic panel   Hemoglobin A1c   Obesity - Primary    Encouraged DASH diet, decrease po intake and increase exercise as tolerated. Needs 7-8 hours of sleep nightly. Avoid trans fats, eat small, frequent meals every 4-5 hours with lean proteins, complex carbs and healthy fats. Minimize simple carbs      Preventative health care    Patient encouraged to maintain heart healthy diet, regular exercise, adequate sleep. Consider daily probiotics. Take medications as prescribed. Given and reviewed copy of ACP documents from [employer] and encouraged to complete and return      Relevant Orders   CBC   TSH   Lipid panel   Comprehensive metabolic panel   Hemoglobin A1c    Other Visit Diagnoses    Need for Tdap vaccination        Relevant Orders    Tdap vaccine greater than or equal to 7yo IM (Completed)    CBC    TSH    Lipid panel    Comprehensive metabolic panel    Hemoglobin A1c       I have discontinued Ms.  Morency's simvastatin. I am also having her maintain her Cholecalciferol (VITAMIN D PO), OVER THE COUNTER MEDICATION, Co Q 10, aspirin EC, furosemide, losartan, and omeprazole.  No orders of the defined types were placed in this encounter.     Penni Homans, MD

## 2015-06-07 NOTE — Assessment & Plan Note (Signed)
Patient encouraged to maintain heart healthy diet, regular exercise, adequate sleep. Consider daily probiotics. Take medications as prescribed. Given and reviewed copy of ACP documents from [place] Secretary of State and encouraged to complete and return 

## 2015-06-07 NOTE — Assessment & Plan Note (Signed)
Well controlled, no changes to meds. Encouraged heart healthy diet such as the DASH diet and exercise as tolerated.  °

## 2015-06-07 NOTE — Patient Instructions (Signed)
Preventive Care for Adults, Female A healthy lifestyle and preventive care can promote health and wellness. Preventive health guidelines for women include the following key practices.  A routine yearly physical is a good way to check with your health care provider about your health and preventive screening. It is a chance to share any concerns and updates on your health and to receive a thorough exam.  Visit your dentist for a routine exam and preventive care every 6 months. Brush your teeth twice a day and floss once a day. Good oral hygiene prevents tooth decay and gum disease.  The frequency of eye exams is based on your age, health, family medical history, use of contact lenses, and other factors. Follow your health care provider's recommendations for frequency of eye exams.  Eat a healthy diet. Foods like vegetables, fruits, whole grains, low-fat dairy products, and lean protein foods contain the nutrients you need without too many calories. Decrease your intake of foods high in solid fats, added sugars, and salt. Eat the right amount of calories for you.Get information about a proper diet from your health care provider, if necessary.  Regular physical exercise is one of the most important things you can do for your health. Most adults should get at least 150 minutes of moderate-intensity exercise (any activity that increases your heart rate and causes you to sweat) each week. In addition, most adults need muscle-strengthening exercises on 2 or more days a week.  Maintain a healthy weight. The body mass index (BMI) is a screening tool to identify possible weight problems. It provides an estimate of body fat based on height and weight. Your health care provider can find your BMI and can help you achieve or maintain a healthy weight.For adults 20 years and older:  A BMI below 18.5 is considered underweight.  A BMI of 18.5 to 24.9 is normal.  A BMI of 25 to 29.9 is considered overweight.  A  BMI of 30 and above is considered obese.  Maintain normal blood lipids and cholesterol levels by exercising and minimizing your intake of saturated fat. Eat a balanced diet with plenty of fruit and vegetables. Blood tests for lipids and cholesterol should begin at age 45 and be repeated every 5 years. If your lipid or cholesterol levels are high, you are over 50, or you are at high risk for heart disease, you may need your cholesterol levels checked more frequently.Ongoing high lipid and cholesterol levels should be treated with medicines if diet and exercise are not working.  If you smoke, find out from your health care provider how to quit. If you do not use tobacco, do not start.  Lung cancer screening is recommended for adults aged 45-80 years who are at high risk for developing lung cancer because of a history of smoking. A yearly low-dose CT scan of the lungs is recommended for people who have at least a 30-pack-year history of smoking and are a current smoker or have quit within the past 15 years. A pack year of smoking is smoking an average of 1 pack of cigarettes a day for 1 year (for example: 1 pack a day for 30 years or 2 packs a day for 15 years). Yearly screening should continue until the smoker has stopped smoking for at least 15 years. Yearly screening should be stopped for people who develop a health problem that would prevent them from having lung cancer treatment.  If you are pregnant, do not drink alcohol. If you are  breastfeeding, be very cautious about drinking alcohol. If you are not pregnant and choose to drink alcohol, do not have more than 1 drink per day. One drink is considered to be 12 ounces (355 mL) of beer, 5 ounces (148 mL) of wine, or 1.5 ounces (44 mL) of liquor.  Avoid use of street drugs. Do not share needles with anyone. Ask for help if you need support or instructions about stopping the use of drugs.  High blood pressure causes heart disease and increases the risk  of stroke. Your blood pressure should be checked at least every 1 to 2 years. Ongoing high blood pressure should be treated with medicines if weight loss and exercise do not work.  If you are 55-79 years old, ask your health care provider if you should take aspirin to prevent strokes.  Diabetes screening is done by taking a blood sample to check your blood glucose level after you have not eaten for a certain period of time (fasting). If you are not overweight and you do not have risk factors for diabetes, you should be screened once every 3 years starting at age 45. If you are overweight or obese and you are 40-70 years of age, you should be screened for diabetes every year as part of your cardiovascular risk assessment.  Breast cancer screening is essential preventive care for women. You should practice "breast self-awareness." This means understanding the normal appearance and feel of your breasts and may include breast self-examination. Any changes detected, no matter how small, should be reported to a health care provider. Women in their 20s and 30s should have a clinical breast exam (CBE) by a health care provider as part of a regular health exam every 1 to 3 years. After age 40, women should have a CBE every year. Starting at age 40, women should consider having a mammogram (breast X-ray test) every year. Women who have a family history of breast cancer should talk to their health care provider about genetic screening. Women at a high risk of breast cancer should talk to their health care providers about having an MRI and a mammogram every year.  Breast cancer gene (BRCA)-related cancer risk assessment is recommended for women who have family members with BRCA-related cancers. BRCA-related cancers include breast, ovarian, tubal, and peritoneal cancers. Having family members with these cancers may be associated with an increased risk for harmful changes (mutations) in the breast cancer genes BRCA1 and  BRCA2. Results of the assessment will determine the need for genetic counseling and BRCA1 and BRCA2 testing.  Your health care provider may recommend that you be screened regularly for cancer of the pelvic organs (ovaries, uterus, and vagina). This screening involves a pelvic examination, including checking for microscopic changes to the surface of your cervix (Pap test). You may be encouraged to have this screening done every 3 years, beginning at age 21.  For women ages 30-65, health care providers may recommend pelvic exams and Pap testing every 3 years, or they may recommend the Pap and pelvic exam, combined with testing for human papilloma virus (HPV), every 5 years. Some types of HPV increase your risk of cervical cancer. Testing for HPV may also be done on women of any age with unclear Pap test results.  Other health care providers may not recommend any screening for nonpregnant women who are considered low risk for pelvic cancer and who do not have symptoms. Ask your health care provider if a screening pelvic exam is right for   you.  If you have had past treatment for cervical cancer or a condition that could lead to cancer, you need Pap tests and screening for cancer for at least 20 years after your treatment. If Pap tests have been discontinued, your risk factors (such as having a new sexual partner) need to be reassessed to determine if screening should resume. Some women have medical problems that increase the chance of getting cervical cancer. In these cases, your health care provider may recommend more frequent screening and Pap tests.  Colorectal cancer can be detected and often prevented. Most routine colorectal cancer screening begins at the age of 50 years and continues through age 75 years. However, your health care provider may recommend screening at an earlier age if you have risk factors for colon cancer. On a yearly basis, your health care provider may provide home test kits to check  for hidden blood in the stool. Use of a small camera at the end of a tube, to directly examine the colon (sigmoidoscopy or colonoscopy), can detect the earliest forms of colorectal cancer. Talk to your health care provider about this at age 50, when routine screening begins. Direct exam of the colon should be repeated every 5-10 years through age 75 years, unless early forms of precancerous polyps or small growths are found.  People who are at an increased risk for hepatitis B should be screened for this virus. You are considered at high risk for hepatitis B if:  You were born in a country where hepatitis B occurs often. Talk with your health care provider about which countries are considered high risk.  Your parents were born in a high-risk country and you have not received a shot to protect against hepatitis B (hepatitis B vaccine).  You have HIV or AIDS.  You use needles to inject street drugs.  You live with, or have sex with, someone who has hepatitis B.  You get hemodialysis treatment.  You take certain medicines for conditions like cancer, organ transplantation, and autoimmune conditions.  Hepatitis C blood testing is recommended for all people born from 1945 through 1965 and any individual with known risks for hepatitis C.  Practice safe sex. Use condoms and avoid high-risk sexual practices to reduce the spread of sexually transmitted infections (STIs). STIs include gonorrhea, chlamydia, syphilis, trichomonas, herpes, HPV, and human immunodeficiency virus (HIV). Herpes, HIV, and HPV are viral illnesses that have no cure. They can result in disability, cancer, and death.  You should be screened for sexually transmitted illnesses (STIs) including gonorrhea and chlamydia if:  You are sexually active and are younger than 24 years.  You are older than 24 years and your health care provider tells you that you are at risk for this type of infection.  Your sexual activity has changed  since you were last screened and you are at an increased risk for chlamydia or gonorrhea. Ask your health care provider if you are at risk.  If you are at risk of being infected with HIV, it is recommended that you take a prescription medicine daily to prevent HIV infection. This is called preexposure prophylaxis (PrEP). You are considered at risk if:  You are sexually active and do not regularly use condoms or know the HIV status of your partner(s).  You take drugs by injection.  You are sexually active with a partner who has HIV.  Talk with your health care provider about whether you are at high risk of being infected with HIV. If   you choose to begin PrEP, you should first be tested for HIV. You should then be tested every 3 months for as long as you are taking PrEP.  Osteoporosis is a disease in which the bones lose minerals and strength with aging. This can result in serious bone fractures or breaks. The risk of osteoporosis can be identified using a bone density scan. Women ages 67 years and over and women at risk for fractures or osteoporosis should discuss screening with their health care providers. Ask your health care provider whether you should take a calcium supplement or vitamin D to reduce the rate of osteoporosis.  Menopause can be associated with physical symptoms and risks. Hormone replacement therapy is available to decrease symptoms and risks. You should talk to your health care provider about whether hormone replacement therapy is right for you.  Use sunscreen. Apply sunscreen liberally and repeatedly throughout the day. You should seek shade when your shadow is shorter than you. Protect yourself by wearing long sleeves, pants, a wide-brimmed hat, and sunglasses year round, whenever you are outdoors.  Once a month, do a whole body skin exam, using a mirror to look at the skin on your back. Tell your health care provider of new moles, moles that have irregular borders, moles that  are larger than a pencil eraser, or moles that have changed in shape or color.  Stay current with required vaccines (immunizations).  Influenza vaccine. All adults should be immunized every year.  Tetanus, diphtheria, and acellular pertussis (Td, Tdap) vaccine. Pregnant women should receive 1 dose of Tdap vaccine during each pregnancy. The dose should be obtained regardless of the length of time since the last dose. Immunization is preferred during the 27th-36th week of gestation. An adult who has not previously received Tdap or who does not know her vaccine status should receive 1 dose of Tdap. This initial dose should be followed by tetanus and diphtheria toxoids (Td) booster doses every 10 years. Adults with an unknown or incomplete history of completing a 3-dose immunization series with Td-containing vaccines should begin or complete a primary immunization series including a Tdap dose. Adults should receive a Td booster every 10 years.  Varicella vaccine. An adult without evidence of immunity to varicella should receive 2 doses or a second dose if she has previously received 1 dose. Pregnant females who do not have evidence of immunity should receive the first dose after pregnancy. This first dose should be obtained before leaving the health care facility. The second dose should be obtained 4-8 weeks after the first dose.  Human papillomavirus (HPV) vaccine. Females aged 13-26 years who have not received the vaccine previously should obtain the 3-dose series. The vaccine is not recommended for use in pregnant females. However, pregnancy testing is not needed before receiving a dose. If a female is found to be pregnant after receiving a dose, no treatment is needed. In that case, the remaining doses should be delayed until after the pregnancy. Immunization is recommended for any person with an immunocompromised condition through the age of 61 years if she did not get any or all doses earlier. During the  3-dose series, the second dose should be obtained 4-8 weeks after the first dose. The third dose should be obtained 24 weeks after the first dose and 16 weeks after the second dose.  Zoster vaccine. One dose is recommended for adults aged 30 years or older unless certain conditions are present.  Measles, mumps, and rubella (MMR) vaccine. Adults born  before 1957 generally are considered immune to measles and mumps. Adults born in 1957 or later should have 1 or more doses of MMR vaccine unless there is a contraindication to the vaccine or there is laboratory evidence of immunity to each of the three diseases. A routine second dose of MMR vaccine should be obtained at least 28 days after the first dose for students attending postsecondary schools, health care workers, or international travelers. People who received inactivated measles vaccine or an unknown type of measles vaccine during 1963-1967 should receive 2 doses of MMR vaccine. People who received inactivated mumps vaccine or an unknown type of mumps vaccine before 1979 and are at high risk for mumps infection should consider immunization with 2 doses of MMR vaccine. For females of childbearing age, rubella immunity should be determined. If there is no evidence of immunity, females who are not pregnant should be vaccinated. If there is no evidence of immunity, females who are pregnant should delay immunization until after pregnancy. Unvaccinated health care workers born before 1957 who lack laboratory evidence of measles, mumps, or rubella immunity or laboratory confirmation of disease should consider measles and mumps immunization with 2 doses of MMR vaccine or rubella immunization with 1 dose of MMR vaccine.  Pneumococcal 13-valent conjugate (PCV13) vaccine. When indicated, a person who is uncertain of his immunization history and has no record of immunization should receive the PCV13 vaccine. All adults 65 years of age and older should receive this  vaccine. An adult aged 19 years or older who has certain medical conditions and has not been previously immunized should receive 1 dose of PCV13 vaccine. This PCV13 should be followed with a dose of pneumococcal polysaccharide (PPSV23) vaccine. Adults who are at high risk for pneumococcal disease should obtain the PPSV23 vaccine at least 8 weeks after the dose of PCV13 vaccine. Adults older than 54 years of age who have normal immune system function should obtain the PPSV23 vaccine dose at least 1 year after the dose of PCV13 vaccine.  Pneumococcal polysaccharide (PPSV23) vaccine. When PCV13 is also indicated, PCV13 should be obtained first. All adults aged 65 years and older should be immunized. An adult younger than age 65 years who has certain medical conditions should be immunized. Any person who resides in a nursing home or long-term care facility should be immunized. An adult smoker should be immunized. People with an immunocompromised condition and certain other conditions should receive both PCV13 and PPSV23 vaccines. People with human immunodeficiency virus (HIV) infection should be immunized as soon as possible after diagnosis. Immunization during chemotherapy or radiation therapy should be avoided. Routine use of PPSV23 vaccine is not recommended for American Indians, [place] Natives, or people younger than 65 years unless there are medical conditions that require PPSV23 vaccine. When indicated, people who have unknown immunization and have no record of immunization should receive PPSV23 vaccine. One-time revaccination 5 years after the first dose of PPSV23 is recommended for people aged 19-64 years who have chronic kidney failure, nephrotic syndrome, asplenia, or immunocompromised conditions. People who received 1-2 doses of PPSV23 before age 65 years should receive another dose of PPSV23 vaccine at age 65 years or later if at least 5 years have passed since the previous dose. Doses of PPSV23 are not  needed for people immunized with PPSV23 at or after age 65 years.  Meningococcal vaccine. Adults with asplenia or persistent complement component deficiencies should receive 2 doses of quadrivalent meningococcal conjugate (MenACWY-D) vaccine. The doses should be obtained   at least 2 months apart. Microbiologists working with certain meningococcal bacteria, [place] recruits, people at risk during an outbreak, and people who travel to or live in countries with a high rate of meningitis should be immunized. A first-year college student up through age 34 years who is living in a residence hall should receive a dose if she did not receive a dose on or after her 16th birthday. Adults who have certain high-risk conditions should receive one or more doses of vaccine.  Hepatitis A vaccine. Adults who wish to be protected from this disease, have certain high-risk conditions, work with hepatitis A-infected animals, work in hepatitis A research labs, or travel to or work in countries with a high rate of hepatitis A should be immunized. Adults who were previously unvaccinated and who anticipate close contact with an international adoptee during the first 60 days after arrival in the [place] from a country with a high rate of hepatitis A should be immunized.  Hepatitis B vaccine. Adults who wish to be protected from this disease, have certain high-risk conditions, may be exposed to blood or other infectious body fluids, are household contacts or sex partners of hepatitis B positive people, are clients or workers in certain care facilities, or travel to or work in countries with a high rate of hepatitis B should be immunized.  Haemophilus influenzae type b (Hib) vaccine. A previously unvaccinated person with asplenia or sickle cell disease or having a scheduled splenectomy should receive 1 dose of Hib vaccine. Regardless of previous immunization, a recipient of a hematopoietic stem cell transplant should receive a  3-dose series 6-12 months after her successful transplant. Hib vaccine is not recommended for adults with HIV infection. Preventive Services / Frequency Ages 35 to 4 years  Blood pressure check.** / Every 3-5 years.  Lipid and cholesterol check.** / Every 5 years beginning at age 60.  Clinical breast exam.** / Every 3 years for women in their 71s and 10s.  BRCA-related cancer risk assessment.** / For women who have family members with a BRCA-related cancer (breast, ovarian, tubal, or peritoneal cancers).  Pap test.** / Every 2 years from ages 76 through 26. Every 3 years starting at age 61 through age 76 or 93 with a history of 3 consecutive normal Pap tests.  HPV screening.** / Every 3 years from ages 37 through ages 60 to 51 with a history of 3 consecutive normal Pap tests.  Hepatitis C blood test.** / For any individual with known risks for hepatitis C.  Skin self-exam. / Monthly.  Influenza vaccine. / Every year.  Tetanus, diphtheria, and acellular pertussis (Tdap, Td) vaccine.** / Consult your health care provider. Pregnant women should receive 1 dose of Tdap vaccine during each pregnancy. 1 dose of Td every 10 years.  Varicella vaccine.** / Consult your health care provider. Pregnant females who do not have evidence of immunity should receive the first dose after pregnancy.  HPV vaccine. / 3 doses over 6 months, if 93 and younger. The vaccine is not recommended for use in pregnant females. However, pregnancy testing is not needed before receiving a dose.  Measles, mumps, rubella (MMR) vaccine.** / You need at least 1 dose of MMR if you were born in 1957 or later. You may also need a 2nd dose. For females of childbearing age, rubella immunity should be determined. If there is no evidence of immunity, females who are not pregnant should be vaccinated. If there is no evidence of immunity, females who are  pregnant should delay immunization until after pregnancy.  Pneumococcal  13-valent conjugate (PCV13) vaccine.** / Consult your health care provider.  Pneumococcal polysaccharide (PPSV23) vaccine.** / 1 to 2 doses if you smoke cigarettes or if you have certain conditions.  Meningococcal vaccine.** / 1 dose if you are age 68 to 8 years and a Market researcher living in a residence hall, or have one of several medical conditions, you need to get vaccinated against meningococcal disease. You may also need additional booster doses.  Hepatitis A vaccine.** / Consult your health care provider.  Hepatitis B vaccine.** / Consult your health care provider.  Haemophilus influenzae type b (Hib) vaccine.** / Consult your health care provider. Ages 7 to 53 years  Blood pressure check.** / Every year.  Lipid and cholesterol check.** / Every 5 years beginning at age 25 years.  Lung cancer screening. / Every year if you are aged 11-80 years and have a 30-pack-year history of smoking and currently smoke or have quit within the past 15 years. Yearly screening is stopped once you have quit smoking for at least 15 years or develop a health problem that would prevent you from having lung cancer treatment.  Clinical breast exam.** / Every year after age 48 years.  BRCA-related cancer risk assessment.** / For women who have family members with a BRCA-related cancer (breast, ovarian, tubal, or peritoneal cancers).  Mammogram.** / Every year beginning at age 41 years and continuing for as long as you are in good health. Consult with your health care provider.  Pap test.** / Every 3 years starting at age 65 years through age 37 or 70 years with a history of 3 consecutive normal Pap tests.  HPV screening.** / Every 3 years from ages 72 years through ages 60 to 40 years with a history of 3 consecutive normal Pap tests.  Fecal occult blood test (FOBT) of stool. / Every year beginning at age 21 years and continuing until age 5 years. You may not need to do this test if you get  a colonoscopy every 10 years.  Flexible sigmoidoscopy or colonoscopy.** / Every 5 years for a flexible sigmoidoscopy or every 10 years for a colonoscopy beginning at age 35 years and continuing until age 48 years.  Hepatitis C blood test.** / For all people born from [birth year] through 1965 and any individual with known risks for hepatitis C.  Skin self-exam. / Monthly.  Influenza vaccine. / Every year.  Tetanus, diphtheria, and acellular pertussis (Tdap/Td) vaccine.** / Consult your health care provider. Pregnant women should receive 1 dose of Tdap vaccine during each pregnancy. 1 dose of Td every 10 years.  Varicella vaccine.** / Consult your health care provider. Pregnant females who do not have evidence of immunity should receive the first dose after pregnancy.  Zoster vaccine.** / 1 dose for adults aged 30 years or older.  Measles, mumps, rubella (MMR) vaccine.** / You need at least 1 dose of MMR if you were born in 1957 or later. You may also need a second dose. For females of childbearing age, rubella immunity should be determined. If there is no evidence of immunity, females who are not pregnant should be vaccinated. If there is no evidence of immunity, females who are pregnant should delay immunization until after pregnancy.  Pneumococcal 13-valent conjugate (PCV13) vaccine.** / Consult your health care provider.  Pneumococcal polysaccharide (PPSV23) vaccine.** / 1 to 2 doses if you smoke cigarettes or if you have certain conditions.  Meningococcal vaccine.** /  Consult your health care provider.  Hepatitis A vaccine.** / Consult your health care provider.  Hepatitis B vaccine.** / Consult your health care provider.  Haemophilus influenzae type b (Hib) vaccine.** / Consult your health care provider. Ages 64 years and over  Blood pressure check.** / Every year.  Lipid and cholesterol check.** / Every 5 years beginning at age 23 years.  Lung cancer screening. / Every year if you  are aged 16-80 years and have a 30-pack-year history of smoking and currently smoke or have quit within the past 15 years. Yearly screening is stopped once you have quit smoking for at least 15 years or develop a health problem that would prevent you from having lung cancer treatment.  Clinical breast exam.** / Every year after age 74 years.  BRCA-related cancer risk assessment.** / For women who have family members with a BRCA-related cancer (breast, ovarian, tubal, or peritoneal cancers).  Mammogram.** / Every year beginning at age 44 years and continuing for as long as you are in good health. Consult with your health care provider.  Pap test.** / Every 3 years starting at age 58 years through age 22 or 39 years with 3 consecutive normal Pap tests. Testing can be stopped between 65 and 70 years with 3 consecutive normal Pap tests and no abnormal Pap or HPV tests in the past 10 years.  HPV screening.** / Every 3 years from ages 64 years through ages 70 or 61 years with a history of 3 consecutive normal Pap tests. Testing can be stopped between 65 and 70 years with 3 consecutive normal Pap tests and no abnormal Pap or HPV tests in the past 10 years.  Fecal occult blood test (FOBT) of stool. / Every year beginning at age 40 years and continuing until age 27 years. You may not need to do this test if you get a colonoscopy every 10 years.  Flexible sigmoidoscopy or colonoscopy.** / Every 5 years for a flexible sigmoidoscopy or every 10 years for a colonoscopy beginning at age 7 years and continuing until age 32 years.  Hepatitis C blood test.** / For all people born from [birth year] through 1965 and any individual with known risks for hepatitis C.  Osteoporosis screening.** / A one-time screening for women ages 30 years and over and women at risk for fractures or osteoporosis.  Skin self-exam. / Monthly.  Influenza vaccine. / Every year.  Tetanus, diphtheria, and acellular pertussis (Tdap/Td)  vaccine.** / 1 dose of Td every 10 years.  Varicella vaccine.** / Consult your health care provider.  Zoster vaccine.** / 1 dose for adults aged 35 years or older.  Pneumococcal 13-valent conjugate (PCV13) vaccine.** / Consult your health care provider.  Pneumococcal polysaccharide (PPSV23) vaccine.** / 1 dose for all adults aged 46 years and older.  Meningococcal vaccine.** / Consult your health care provider.  Hepatitis A vaccine.** / Consult your health care provider.  Hepatitis B vaccine.** / Consult your health care provider.  Haemophilus influenzae type b (Hib) vaccine.** / Consult your health care provider. ** Family history and personal history of risk and conditions may change your health care provider's recommendations.   This information is not intended to replace advice given to you by your health care provider. Make sure you discuss any questions you have with your health care provider.   Document Released: 04/22/2001 Document Revised: 03/17/2014 Document Reviewed: 07/22/2010 Elsevier Interactive Patient Education [employer].

## 2015-06-07 NOTE — Assessment & Plan Note (Signed)
minimize simple carbs. Increase exercise as tolerated.  

## 2015-06-07 NOTE — Progress Notes (Signed)
Pre visit review using our clinic review tool, if applicable. No additional management support is needed unless otherwise documented below in the visit note. 

## 2015-06-07 NOTE — Assessment & Plan Note (Signed)
Encouraged DASH diet, decrease po intake and increase exercise as tolerated. Needs 7-8 hours of sleep nightly. Avoid trans fats, eat small, frequent meals every 4-5 hours with lean proteins, complex carbs and healthy fats. Minimize simple carbs 

## 2015-09-12 ENCOUNTER — Other Ambulatory Visit (INDEPENDENT_AMBULATORY_CARE_PROVIDER_SITE_OTHER): Payer: 59

## 2015-09-12 DIAGNOSIS — Z0000 Encounter for general adult medical examination without abnormal findings: Secondary | ICD-10-CM

## 2015-09-12 DIAGNOSIS — E785 Hyperlipidemia, unspecified: Secondary | ICD-10-CM

## 2015-09-12 DIAGNOSIS — Z23 Encounter for immunization: Secondary | ICD-10-CM | POA: Diagnosis not present

## 2015-09-12 DIAGNOSIS — E210 Primary hyperparathyroidism: Secondary | ICD-10-CM | POA: Diagnosis not present

## 2015-09-12 DIAGNOSIS — I10 Essential (primary) hypertension: Secondary | ICD-10-CM

## 2015-09-12 DIAGNOSIS — R739 Hyperglycemia, unspecified: Secondary | ICD-10-CM

## 2015-09-12 LAB — COMPREHENSIVE METABOLIC PANEL
ALT: 19 U/L (ref 0–35)
AST: 17 U/L (ref 0–37)
Albumin: 4.2 g/dL (ref 3.5–5.2)
Alkaline Phosphatase: 84 U/L (ref 39–117)
BUN: 22 mg/dL (ref 6–23)
CO2: 30 mEq/L (ref 19–32)
Calcium: 9.9 mg/dL (ref 8.4–10.5)
Chloride: 104 mEq/L (ref 96–112)
Creatinine, Ser: 0.75 mg/dL (ref 0.40–1.20)
GFR: 85.71 mL/min (ref >60.00)
Glucose, Bld: 107 mg/dL — ABNORMAL HIGH (ref 70–99)
Potassium: 4.7 mEq/L (ref 3.5–5.1)
Sodium: 139 mEq/L (ref 135–145)
Total Bilirubin: 0.3 mg/dL (ref 0.2–1.2)
Total Protein: 7.4 g/dL (ref 6.0–8.3)

## 2015-09-12 LAB — LIPID PANEL
Cholesterol: 243 mg/dL — ABNORMAL HIGH (ref 0–200)
HDL: 50.8 mg/dL (ref >39.00)
LDL Cholesterol: 160 mg/dL — ABNORMAL HIGH (ref 0–99)
NonHDL: 192.59
Total CHOL/HDL Ratio: 5
Triglycerides: 165 mg/dL — ABNORMAL HIGH (ref 0.0–149.0)
VLDL: 33 mg/dL (ref 0.0–40.0)

## 2015-09-12 LAB — CBC
HCT: 40.5 % (ref 36.0–46.0)
Hemoglobin: 13.5 g/dL (ref 12.0–15.0)
MCHC: 33.2 g/dL (ref 30.0–36.0)
MCV: 87.7 fl (ref 78.0–100.0)
Platelets: 296 10*3/uL (ref 150.0–400.0)
RBC: 4.62 Mil/uL (ref 3.87–5.11)
RDW: 14.1 % (ref 11.5–15.5)
WBC: 6.6 10*3/uL (ref 4.0–10.5)

## 2015-09-12 LAB — HEMOGLOBIN A1C: Hgb A1c MFr Bld: 5.7 % (ref 4.6–6.5)

## 2015-09-12 LAB — TSH: TSH: 4.14 u[IU]/mL (ref 0.35–4.50)

## 2015-10-08 LAB — HM MAMMOGRAPHY

## 2015-10-09 ENCOUNTER — Encounter: Payer: 59 | Admitting: Family Medicine

## 2015-10-12 LAB — HM PAP SMEAR: HM Pap smear: NEGATIVE

## 2015-10-17 ENCOUNTER — Other Ambulatory Visit: Payer: Self-pay | Admitting: Family Medicine

## 2015-10-17 NOTE — Telephone Encounter (Signed)
Refill sent per [facility] refill protocol/SLS  

## 2015-11-20 ENCOUNTER — Ambulatory Visit (INDEPENDENT_AMBULATORY_CARE_PROVIDER_SITE_OTHER): Payer: 59 | Admitting: Family Medicine

## 2015-11-20 ENCOUNTER — Encounter: Payer: Self-pay | Admitting: Family Medicine

## 2015-11-20 DIAGNOSIS — Z23 Encounter for immunization: Secondary | ICD-10-CM | POA: Diagnosis not present

## 2015-11-20 DIAGNOSIS — R519 Headache, unspecified: Secondary | ICD-10-CM

## 2015-11-20 DIAGNOSIS — E785 Hyperlipidemia, unspecified: Secondary | ICD-10-CM

## 2015-11-20 DIAGNOSIS — I10 Essential (primary) hypertension: Secondary | ICD-10-CM | POA: Diagnosis not present

## 2015-11-20 DIAGNOSIS — R51 Headache: Secondary | ICD-10-CM

## 2015-11-20 DIAGNOSIS — K219 Gastro-esophageal reflux disease without esophagitis: Secondary | ICD-10-CM

## 2015-11-20 DIAGNOSIS — R739 Hyperglycemia, unspecified: Secondary | ICD-10-CM

## 2015-11-20 NOTE — Patient Instructions (Signed)

## 2015-11-20 NOTE — Progress Notes (Signed)
Pre visit review using our clinic review tool, if applicable. No additional management support is needed unless otherwise documented below in the visit note. 

## 2015-11-25 ENCOUNTER — Encounter: Payer: Self-pay | Admitting: Family Medicine

## 2015-11-25 DIAGNOSIS — R519 Headache, unspecified: Secondary | ICD-10-CM

## 2015-11-25 DIAGNOSIS — R51 Headache: Secondary | ICD-10-CM

## 2015-11-25 HISTORY — DX: Headache, unspecified: R51.9

## 2015-11-25 NOTE — Assessment & Plan Note (Signed)
Avoid offending foods, take probiotics. Do not eat large meals in late evening and consider raising head of bed.  

## 2015-11-25 NOTE — Assessment & Plan Note (Signed)
minimize simple carbs. Increase exercise as tolerated.  

## 2015-11-25 NOTE — Assessment & Plan Note (Signed)
Well controlled, no changes to meds. Encouraged heart healthy diet such as the DASH diet and exercise as tolerated.  °

## 2015-11-25 NOTE — Progress Notes (Signed)
Patient ID: Sandra [name], female   DOB: March 02, 1961, 55 y.o.   MRN: [id number]   Subjective:    Patient ID: Sandra [name], female    DOB: [birth year], 55 y.o.   MRN: [id number]  Chief Complaint  Patient presents with  . Headache    HPI Patient is in today for follow up. She is struggling with intermittent low grade headaches recently. No other neurologic complaints, congestion or recent acute illness. Denies CP/palp/SOB/HA/congestion/fevers/GI or GU c/o. Taking meds as prescribed.   Past Medical History:  Diagnosis Date  . Allergic rhinitis 05/31/2012  . Chest pain    PT SAW CARDIOLOGIST DR. Burt Knack - STRESS ECHO DONE NEGATIVE - NO PROBLEM SINCE  . Chicken pox as achild  . Cough 11/29/2012   RESOLVED - IT WAS CAUSED BY LISINOPRIL  . Elevated LFTs 05/31/2012  . GERD (gastroesophageal reflux disease)   . H/O tobacco use, presenting hazards to health 11/29/2012   Smoked 1 1/2 ppd for 20 years quit in roughtly 2007  . Headache 11/25/2015  . Hematuria 04/09/2014  . Hoarseness 02/04/2012   RESOLVED - THOUGHT TO HAVE BEEN ALLERY RELATED  . Hyperglycemia 04/06/2014  . Hyperlipidemia   . Hyperparathyroidism ([place])   . Hypertension   . Obesity, unspecified 05/31/2012  . Ocular migraine   . Overactive bladder 05/31/2012  . Preventative health care 02/04/2012  . Rapid heart beat    PT FEELS PROB RELATED TO HYPER PARATHYROIDISM  . SUI (stress urinary incontinence, female) 02/04/2012   Sees Dr Perlie Gold and they have discussed a bladder tack but so far she declines    Past Surgical History:  Procedure Laterality Date  . BREAST SURGERY     left breast excision of adenoma  . btl    . PARATHYROIDECTOMY Right 08/12/2013   Procedure: right inferior frozen section PARATHYROIDECTOMY;  Surgeon: Earnstine Regal, MD;  Location: [facility];  Service: General;  Laterality: Right;  . PELVIC LAPAROSCOPY  03-2004  . TUBAL LIGATION    . WISDOM TOOTH EXTRACTION  30 yrs ago    Family History  Problem  Relation Age of Onset  . Hypertension Mother 74    alive  . Renal cancer Mother     renal  . Hyperlipidemia Mother   . Hypertension Father 46    alive  . Atrial fibrillation Father   . Colon cancer Father     colon  . Hyperlipidemia Father   . Hypertension Brother 47    alive  . Hypertension Sister   . Lung cancer Maternal Grandfather     lung/ smoker  . Emphysema Maternal Grandfather   . Heart disease Paternal Grandmother   . Other Paternal Grandfather     black lung  . Heart disease Paternal Grandfather   . Hypertension Sister   . Heart disease Paternal Aunt   . Heart disease Paternal Uncle   . COPD Sister     Social History   Social History  . Marital status: Married    Spouse name: N/A  . Number of children: 2  . Years of education: N/A   Occupational History  . RN   .  [employer]   Social History Main Topics  . Smoking status: Former Smoker    Packs/day: 1.00    Years: 20.00    Types: Cigarettes    Quit date: 03/10/2005  . Smokeless tobacco: Never Used  . Alcohol use Yes     Comment: very rarely  . Drug use:  No  . Sexual activity: Yes    Partners: Male   Other Topics Concern  . Not on file   Social History Narrative  . No narrative on file    Outpatient Medications Prior to Visit  Medication Sig Dispense Refill  . aspirin EC 81 MG tablet Take 81 mg by mouth at bedtime.    . Cholecalciferol (VITAMIN D PO) Take 2,000 mg by mouth daily. Vitamin D    . furosemide (LASIX) 20 MG tablet Take 1 tablet by mouth  daily 90 tablet 1  . losartan (COZAAR) 50 MG tablet Take 1 tablet by mouth  daily 90 tablet 1  . omeprazole (PRILOSEC) 40 MG capsule Take 1 capsule by mouth  daily 90 capsule 1  . OVER THE COUNTER MEDICATION Take 1 capsule by mouth daily. Mega red- 1 capsule daily    . Coenzyme Q10 (CO Q 10) 100 MG CAPS Take 400 mg by mouth daily.     No facility-administered medications prior to visit.     Allergies  Allergen Reactions  . Cefdinir  Other (See Comments)    Severe Stomach cramps and diarrhea for several days.  . Codeine Nausea And Vomiting  . Penicillins     RASH, ITCHING  . Simvastatin     myalgias    Review of Systems  Constitutional: Negative for chills, fever and malaise/fatigue.  HENT: Negative for congestion and hearing loss.   Eyes: Negative for blurred vision and discharge.  Respiratory: Negative for cough, sputum production and shortness of breath.   Cardiovascular: Negative for chest pain, palpitations and leg swelling.  Gastrointestinal: Negative for abdominal pain, blood in stool, constipation, diarrhea, heartburn, nausea and vomiting.  Genitourinary: Negative for dysuria, frequency, hematuria and urgency.  Musculoskeletal: Negative for back pain, falls and myalgias.  Skin: Negative for rash.  Neurological: Positive for headaches. Negative for dizziness, sensory change, loss of consciousness and weakness.  Endo/Heme/Allergies: Negative for environmental allergies. Does not bruise/bleed easily.  Psychiatric/Behavioral: Negative for depression and suicidal ideas. The patient is not nervous/anxious and does not have insomnia.        Objective:    Physical Exam  Constitutional: She is oriented to person, place, and time. She appears well-developed and well-nourished. No distress.  HENT:  Head: Normocephalic and atraumatic.  Nose: Nose normal.  Eyes: Right eye exhibits no discharge. Left eye exhibits no discharge.  Neck: Normal range of motion. Neck supple.  Cardiovascular: Normal rate and regular rhythm.   No murmur heard. Pulmonary/Chest: Effort normal and breath sounds normal.  Abdominal: Soft. Bowel sounds are normal. There is no tenderness.  Musculoskeletal: She exhibits no edema.  Neurological: She is alert and oriented to person, place, and time.  Skin: Skin is warm and dry.  Psychiatric: She has a normal mood and affect.  Nursing note and vitals reviewed.   BP 134/88 (BP Location: Right  Arm, Patient Position: Sitting, Cuff Size: Large)   Pulse 80   Temp 98.6 F (37 C) (Oral)   Ht 5\' 5"  (1.651 m)   Wt 193 lb 6 oz (87.7 kg)   LMP 04/01/2011   SpO2 94%   BMI 32.18 kg/m  Wt Readings from Last 3 Encounters:  11/20/15 193 lb 6 oz (87.7 kg)  06/07/15 183 lb (83 kg)  03/26/15 196 lb 12.8 oz (89.3 kg)     Lab Results  Component Value Date   WBC 6.6 09/12/2015   HGB 13.5 09/12/2015   HCT 40.5 09/12/2015   PLT  296.0 09/12/2015   GLUCOSE 107 (H) 09/12/2015   CHOL 243 (H) 09/12/2015   TRIG 165.0 (H) 09/12/2015   HDL 50.80 09/12/2015   LDLDIRECT 120.1 02/04/2012   LDLCALC 160 (H) 09/12/2015   ALT 19 09/12/2015   AST 17 09/12/2015   NA 139 09/12/2015   K 4.7 09/12/2015   CL 104 09/12/2015   CREATININE 0.75 09/12/2015   BUN 22 09/12/2015   CO2 30 09/12/2015   TSH 4.14 09/12/2015   HGBA1C 5.7 09/12/2015    Lab Results  Component Value Date   TSH 4.14 09/12/2015   Lab Results  Component Value Date   WBC 6.6 09/12/2015   HGB 13.5 09/12/2015   HCT 40.5 09/12/2015   MCV 87.7 09/12/2015   PLT 296.0 09/12/2015   Lab Results  Component Value Date   NA 139 09/12/2015   K 4.7 09/12/2015   CO2 30 09/12/2015   GLUCOSE 107 (H) 09/12/2015   BUN 22 09/12/2015   CREATININE 0.75 09/12/2015   BILITOT 0.3 09/12/2015   ALKPHOS 84 09/12/2015   AST 17 09/12/2015   ALT 19 09/12/2015   PROT 7.4 09/12/2015   ALBUMIN 4.2 09/12/2015   CALCIUM 9.9 09/12/2015   GFR 85.71 09/12/2015   Lab Results  Component Value Date   CHOL 243 (H) 09/12/2015   Lab Results  Component Value Date   HDL 50.80 09/12/2015   Lab Results  Component Value Date   LDLCALC 160 (H) 09/12/2015   Lab Results  Component Value Date   TRIG 165.0 (H) 09/12/2015   Lab Results  Component Value Date   CHOLHDL 5 09/12/2015   Lab Results  Component Value Date   HGBA1C 5.7 09/12/2015       Assessment & Plan:   Problem List Items Addressed This Visit    Hypertension    Well controlled,  no changes to meds. Encouraged heart healthy diet such as the DASH diet and exercise as tolerated.       Hyperlipidemia    Encouraged heart healthy diet, increase exercise, avoid trans fats, consider a krill oil cap daily      GERD (gastroesophageal reflux disease)    Avoid offending foods, take probiotics. Do not eat large meals in late evening and consider raising head of bed.       Relevant Medications   lactobacillus acidophilus (BACID) TABS tablet   Hyperglycemia    minimize simple carbs. Increase exercise as tolerated.       Headache    Encouraged increased hydration, 64 ounces of clear fluids daily. Minimize alcohol and caffeine. Eat small frequent meals with lean proteins and complex carbs. Avoid high and low blood sugars. Get adequate sleep, 7-8 hours a night. Needs exercise daily preferably in the morning.       Other Visit Diagnoses    Encounter for immunization       Relevant Orders   Flu Vaccine QUAD 36+ mos IM (Completed)      I have discontinued Ms. Zeitlin's Co Q 10. I am also having her maintain her Cholecalciferol (VITAMIN D PO), OVER THE COUNTER MEDICATION, aspirin EC, furosemide, losartan, omeprazole, and lactobacillus acidophilus.  Meds ordered this encounter  Medications  . lactobacillus acidophilus (BACID) TABS tablet    Sig: Take 2 tablets by mouth 3 (three) times daily.     Penni Homans, MD

## 2015-11-25 NOTE — Assessment & Plan Note (Signed)
Encouraged heart healthy diet, increase exercise, avoid trans fats, consider a krill oil cap daily 

## 2015-11-25 NOTE — Assessment & Plan Note (Signed)
Encouraged increased hydration, 64 ounces of clear fluids daily. Minimize alcohol and caffeine. Eat small frequent meals with lean proteins and complex carbs. Avoid high and low blood sugars. Get adequate sleep, 7-8 hours a night. Needs exercise daily preferably in the morning.  

## 2015-12-10 ENCOUNTER — Telehealth: Payer: Self-pay | Admitting: Family Medicine

## 2015-12-10 ENCOUNTER — Ambulatory Visit: Payer: 59 | Admitting: Family Medicine

## 2015-12-10 NOTE — Telephone Encounter (Signed)
Patient cancelled appointment for today and [facility] to Friday 12/14/15. Charge or no charge

## 2015-12-10 NOTE — Telephone Encounter (Signed)
No charge. 

## 2015-12-14 ENCOUNTER — Encounter: Payer: Self-pay | Admitting: Family Medicine

## 2015-12-14 ENCOUNTER — Ambulatory Visit (INDEPENDENT_AMBULATORY_CARE_PROVIDER_SITE_OTHER): Payer: 59 | Admitting: Family Medicine

## 2015-12-14 VITALS — BP 120/80 | HR 81 | Temp 98.6°F | Ht 65.0 in | Wt 197.0 lb

## 2015-12-14 DIAGNOSIS — I10 Essential (primary) hypertension: Secondary | ICD-10-CM

## 2015-12-14 DIAGNOSIS — E785 Hyperlipidemia, unspecified: Secondary | ICD-10-CM | POA: Diagnosis not present

## 2015-12-14 DIAGNOSIS — R739 Hyperglycemia, unspecified: Secondary | ICD-10-CM

## 2015-12-14 DIAGNOSIS — N3281 Overactive bladder: Secondary | ICD-10-CM

## 2015-12-14 DIAGNOSIS — K219 Gastro-esophageal reflux disease without esophagitis: Secondary | ICD-10-CM | POA: Diagnosis not present

## 2015-12-14 LAB — LIPID PANEL
Cholesterol: 286 mg/dL — ABNORMAL HIGH (ref 0–200)
HDL: 53.1 mg/dL (ref >39.00)
NonHDL: 232.91
Total CHOL/HDL Ratio: 5
Triglycerides: 208 mg/dL — ABNORMAL HIGH (ref 0.0–149.0)
VLDL: 41.6 mg/dL — ABNORMAL HIGH (ref 0.0–40.0)

## 2015-12-14 LAB — LDL CHOLESTEROL, DIRECT: Direct LDL: 201 mg/dL

## 2015-12-14 NOTE — Patient Instructions (Signed)

## 2015-12-14 NOTE — Progress Notes (Signed)
Pre visit review using our clinic review tool, if applicable. No additional management support is needed unless otherwise documented below in the visit note. 

## 2015-12-14 NOTE — Progress Notes (Signed)
Patient ID: Sandra [name], female   DOB: 01-05-1961, 55 y.o.   MRN: [id number]   Subjective:    Patient ID: Sandra [name], female    DOB: 01-24-1961, 55 y.o.   MRN: [id number]  Chief Complaint  Patient presents with  . Follow-up    HPI Patient is in today for follow up. She is doing well today but has been struggling with several stressors. manging well enough not to need any medications. No suicidal ideation. Was struggling with overactive bladder but a supplement called Prelief has been helping. Denies CP/palp/SOB/HA/congestion/fevers/GI or GU c/o. Taking meds as prescribed  Past Medical History:  Diagnosis Date  . Allergic rhinitis 05/31/2012  . Chest pain    PT SAW CARDIOLOGIST DR. Burt Knack - STRESS ECHO DONE NEGATIVE - NO PROBLEM SINCE  . Chicken pox as achild  . Cough 11/29/2012   RESOLVED - IT WAS CAUSED BY LISINOPRIL  . Elevated LFTs 05/31/2012  . GERD (gastroesophageal reflux disease)   . H/O tobacco use, presenting hazards to health 11/29/2012   Smoked 1 1/2 ppd for 20 years quit in roughtly 2007  . Headache 11/25/2015  . Hematuria 04/09/2014  . Hoarseness 02/04/2012   RESOLVED - THOUGHT TO HAVE BEEN ALLERY RELATED  . Hyperglycemia 04/06/2014  . Hyperlipidemia   . Hyperparathyroidism ([place])   . Hypertension   . Obesity, unspecified 05/31/2012  . Ocular migraine   . Overactive bladder 05/31/2012  . Preventative health care 02/04/2012  . Rapid heart beat    PT FEELS PROB RELATED TO HYPER PARATHYROIDISM  . SUI (stress urinary incontinence, female) 02/04/2012   Sees Dr Perlie Gold and they have discussed a bladder tack but so far she declines    Past Surgical History:  Procedure Laterality Date  . BREAST SURGERY     left breast excision of adenoma  . btl    . PARATHYROIDECTOMY Right 08/12/2013   Procedure: right inferior frozen section PARATHYROIDECTOMY;  Surgeon: Earnstine Regal, MD;  Location: [facility];  Service: General;  Laterality: Right;  . PELVIC LAPAROSCOPY  03-2004   . TUBAL LIGATION    . WISDOM TOOTH EXTRACTION  30 yrs ago    Family History  Problem Relation Age of Onset  . Hypertension Mother 65    alive  . Renal cancer Mother     renal  . Hyperlipidemia Mother   . Hypertension Father 5    alive  . Atrial fibrillation Father   . Colon cancer Father     colon  . Hyperlipidemia Father   . Hypertension Brother 66    alive  . Hypertension Sister   . Lung cancer Maternal Grandfather     lung/ smoker  . Emphysema Maternal Grandfather   . Heart disease Paternal Grandmother   . Other Paternal Grandfather     black lung  . Heart disease Paternal Grandfather   . Hypertension Sister   . Heart disease Paternal Aunt   . Heart disease Paternal Uncle   . COPD Sister     Social History   Social History  . Marital status: Married    Spouse name: N/A  . Number of children: 2  . Years of education: N/A   Occupational History  . RN   .  [employer]   Social History Main Topics  . Smoking status: Former Smoker    Packs/day: 1.00    Years: 20.00    Types: Cigarettes    Quit date: 03/10/2005  . Smokeless tobacco: Never  Used  . Alcohol use Yes     Comment: very rarely  . Drug use: No  . Sexual activity: Yes    Partners: Male   Other Topics Concern  . Not on file   Social History Narrative  . No narrative on file    Outpatient Medications Prior to Visit  Medication Sig Dispense Refill  . aspirin EC 81 MG tablet Take 81 mg by mouth at bedtime.    . Cholecalciferol (VITAMIN D PO) Take 2,000 mg by mouth daily. Vitamin D    . furosemide (LASIX) 20 MG tablet Take 1 tablet by mouth  daily 90 tablet 1  . lactobacillus acidophilus (BACID) TABS tablet Take 2 tablets by mouth 3 (three) times daily.    Marland Kitchen losartan (COZAAR) 50 MG tablet Take 1 tablet by mouth  daily 90 tablet 1  . omeprazole (PRILOSEC) 40 MG capsule Take 1 capsule by mouth  daily 90 capsule 1  . OVER THE COUNTER MEDICATION Take 1 capsule by mouth daily. Mega red- 1  capsule daily     No facility-administered medications prior to visit.     Allergies  Allergen Reactions  . Cefdinir Other (See Comments)    Severe Stomach cramps and diarrhea for several days.  . Codeine Nausea And Vomiting  . Penicillins     RASH, ITCHING  . Simvastatin     myalgias    Review of Systems  Constitutional: Negative for fever and malaise/fatigue.  HENT: Negative for congestion.   Eyes: Negative for blurred vision.  Respiratory: Negative for shortness of breath.   Cardiovascular: Negative for chest pain, palpitations and leg swelling.  Gastrointestinal: Negative for abdominal pain, blood in stool and nausea.  Genitourinary: Negative for dysuria and frequency.  Musculoskeletal: Negative for falls.  Skin: Negative for rash.  Neurological: Negative for dizziness, loss of consciousness and headaches.  Endo/Heme/Allergies: Negative for environmental allergies.  Psychiatric/Behavioral: Negative for depression. The patient is not nervous/anxious.        Objective:    Physical Exam  Constitutional: She is oriented to person, place, and time. She appears well-developed and well-nourished. No distress.  HENT:  Head: Normocephalic and atraumatic.  Nose: Nose normal.  Eyes: Right eye exhibits no discharge. Left eye exhibits no discharge.  Neck: Normal range of motion. Neck supple.  Cardiovascular: Normal rate and regular rhythm.   No murmur heard. Pulmonary/Chest: Effort normal and breath sounds normal.  Abdominal: Soft. Bowel sounds are normal. There is no tenderness.  Musculoskeletal: She exhibits no edema.  Neurological: She is alert and oriented to person, place, and time. Coordination normal.  Skin: Skin is warm and dry.  Psychiatric: She has a normal mood and affect.  Nursing note and vitals reviewed.   BP 120/80 (BP Location: Left Arm, Patient Position: Sitting, Cuff Size: Normal)   Pulse 81   Temp 98.6 F (37 C) (Oral)   Ht 5\' 5"  (1.651 m)   Wt 197  lb (89.4 kg)   LMP 04/01/2011   SpO2 96%   BMI 32.78 kg/m  Wt Readings from Last 3 Encounters:  12/14/15 197 lb (89.4 kg)  11/20/15 193 lb 6 oz (87.7 kg)  06/07/15 183 lb (83 kg)     Lab Results  Component Value Date   WBC 6.6 09/12/2015   HGB 13.5 09/12/2015   HCT 40.5 09/12/2015   PLT 296.0 09/12/2015   GLUCOSE 107 (H) 09/12/2015   CHOL 243 (H) 09/12/2015   TRIG 165.0 (H) 09/12/2015  HDL 50.80 09/12/2015   LDLDIRECT 120.1 02/04/2012   LDLCALC 160 (H) 09/12/2015   ALT 19 09/12/2015   AST 17 09/12/2015   NA 139 09/12/2015   K 4.7 09/12/2015   CL 104 09/12/2015   CREATININE 0.75 09/12/2015   BUN 22 09/12/2015   CO2 30 09/12/2015   TSH 4.14 09/12/2015   HGBA1C 5.7 09/12/2015    Lab Results  Component Value Date   TSH 4.14 09/12/2015   Lab Results  Component Value Date   WBC 6.6 09/12/2015   HGB 13.5 09/12/2015   HCT 40.5 09/12/2015   MCV 87.7 09/12/2015   PLT 296.0 09/12/2015   Lab Results  Component Value Date   NA 139 09/12/2015   K 4.7 09/12/2015   CO2 30 09/12/2015   GLUCOSE 107 (H) 09/12/2015   BUN 22 09/12/2015   CREATININE 0.75 09/12/2015   BILITOT 0.3 09/12/2015   ALKPHOS 84 09/12/2015   AST 17 09/12/2015   ALT 19 09/12/2015   PROT 7.4 09/12/2015   ALBUMIN 4.2 09/12/2015   CALCIUM 9.9 09/12/2015   GFR 85.71 09/12/2015   Lab Results  Component Value Date   CHOL 243 (H) 09/12/2015   Lab Results  Component Value Date   HDL 50.80 09/12/2015   Lab Results  Component Value Date   LDLCALC 160 (H) 09/12/2015   Lab Results  Component Value Date   TRIG 165.0 (H) 09/12/2015   Lab Results  Component Value Date   CHOLHDL 5 09/12/2015   Lab Results  Component Value Date   HGBA1C 5.7 09/12/2015       Assessment & Plan:   Problem List Items Addressed This Visit    Hypertension   Relevant Orders   TSH   CBC   Comprehensive metabolic panel   Hyperlipidemia - Primary   Relevant Orders   Lipid panel   Hyperglycemia   Relevant  Orders   Hemoglobin A1c    Other Visit Diagnoses   None.     I am having Ms. Cyphers maintain her Cholecalciferol (VITAMIN D PO), OVER THE COUNTER MEDICATION, aspirin EC, furosemide, losartan, omeprazole, and lactobacillus acidophilus.  No orders of the defined types were placed in this encounter.    Penni Homans, MD

## 2015-12-16 NOTE — Assessment & Plan Note (Signed)
Avoid offending foods, take probiotics. Do not eat large meals in late evening and consider raising head of bed. Doing well on current meds

## 2015-12-16 NOTE — Assessment & Plan Note (Signed)
Encouraged heart healthy diet, increase exercise, avoid trans fats, consider a krill oil cap daily 

## 2015-12-16 NOTE — Assessment & Plan Note (Addendum)
Taking a supplement called Prelief which has helped.

## 2015-12-16 NOTE — Assessment & Plan Note (Signed)
minimize simple carbs. Increase exercise as tolerated.  

## 2015-12-17 ENCOUNTER — Other Ambulatory Visit: Payer: Self-pay | Admitting: Family Medicine

## 2015-12-17 MED ORDER — ATORVASTATIN CALCIUM 10 MG PO TABS: ORAL_TABLET | ORAL | 0 refills | Status: DC

## 2015-12-31 ENCOUNTER — Encounter: Payer: Self-pay | Admitting: Family Medicine

## 2016-02-05 ENCOUNTER — Other Ambulatory Visit: Payer: Self-pay | Admitting: Family Medicine

## 2016-02-22 ENCOUNTER — Telehealth: Payer: Self-pay | Admitting: Family Medicine

## 2016-02-22 NOTE — Telephone Encounter (Signed)
FYI:   Friend of patient called to inform provider that patient has been taken to the ER for possible TIA. Patient was taken to [facility] @ [facility]

## 2016-03-07 ENCOUNTER — Other Ambulatory Visit: Payer: Self-pay | Admitting: Family Medicine

## 2016-03-07 ENCOUNTER — Ambulatory Visit (INDEPENDENT_AMBULATORY_CARE_PROVIDER_SITE_OTHER): Payer: 59 | Admitting: Family Medicine

## 2016-03-07 DIAGNOSIS — J309 Allergic rhinitis, unspecified: Secondary | ICD-10-CM

## 2016-03-07 MED ORDER — METOPROLOL SUCCINATE ER 25 MG PO TB24: 25.0000 mg | ORAL_TABLET | Freq: Every day | ORAL | 3 refills | Status: DC

## 2016-03-07 MED ORDER — ATORVASTATIN CALCIUM 10 MG PO TABS: 10.0000 mg | ORAL_TABLET | Freq: Every day | ORAL | 0 refills | Status: DC

## 2016-03-07 MED ORDER — AZITHROMYCIN 250 MG PO TABS: ORAL_TABLET | ORAL | 0 refills | Status: DC

## 2016-03-07 MED ORDER — VERAPAMIL HCL ER 120 MG PO TBCR: 120.0000 mg | EXTENDED_RELEASE_TABLET | Freq: Every day | ORAL | 0 refills | Status: DC

## 2016-03-07 NOTE — Progress Notes (Signed)
Pre visit review using our clinic review tool, if applicable. No additional management support is needed unless otherwise documented below in the visit note. 

## 2016-03-07 NOTE — Patient Instructions (Signed)
Encouraged increased rest and hydration, add probiotics, zinc such as Coldeze or Xicam. Treat fevers as needed Elderberry, vitamin C 500 to 1000 mg daily, aged or black garlic, mucinex twice daily x 10 days    Sinusitis, Adult Sinusitis is soreness and inflammation of your sinuses. Sinuses are hollow spaces in the bones around your face. Your sinuses are located:  Around your eyes.  In the middle of your forehead.  Behind your nose.  In your cheekbones. Your sinuses and nasal passages are lined with a stringy fluid (mucus). Mucus normally drains out of your sinuses. When your nasal tissues become inflamed or swollen, the mucus can become trapped or blocked so air cannot flow through your sinuses. This allows bacteria, viruses, and funguses to grow, which leads to infection. Sinusitis can develop quickly and last for 7?10 days (acute) or for more than 12 weeks (chronic). Sinusitis often develops after a cold. What are the causes? This condition is caused by anything that creates swelling in the sinuses or stops mucus from draining, including:  Allergies.  Asthma.  Bacterial or viral infection.  Abnormally shaped bones between the nasal passages.  Nasal growths that contain mucus (nasal polyps).  Narrow sinus openings.  Pollutants, such as chemicals or irritants in the air.  A foreign object stuck in the nose.  A fungal infection. This is rare. What increases the risk? The following factors may make you more likely to develop this condition:  Having allergies or asthma.  Having had a recent cold or respiratory tract infection.  Having structural deformities or blockages in your nose or sinuses.  Having a weak immune system.  Doing a lot of swimming or diving.  Overusing nasal sprays.  Smoking. What are the signs or symptoms? The main symptoms of this condition are pain and a feeling of pressure around the affected sinuses. Other symptoms include:  Upper  toothache.  Earache.  Headache.  Bad breath.  Decreased sense of smell and taste.  A cough that may get worse at night.  Fatigue.  Fever.  Thick drainage from your nose. The drainage is often green and it may contain pus (purulent).  Stuffy nose or congestion.  Postnasal drip. This is when extra mucus collects in the throat or back of the nose.  Swelling and warmth over the affected sinuses.  Sore throat.  Sensitivity to light. How is this diagnosed? This condition is diagnosed based on symptoms, a medical history, and a physical exam. To find out if your condition is acute or chronic, your health care provider may:  Look in your nose for signs of nasal polyps.  Tap over the affected sinus to check for signs of infection.  View the inside of your sinuses using an imaging device that has a light attached (endoscope). If your health care provider suspects that you have chronic sinusitis, you may also:  Be tested for allergies.  Have a sample of mucus taken from your nose (nasal culture) and checked for bacteria.  Have a mucus sample examined to see if your sinusitis is related to an allergy. If your sinusitis does not respond to treatment and it lasts longer than 8 weeks, you may have an MRI or CT scan to check your sinuses. These scans also help to determine how severe your infection is. In rare cases, a bone biopsy may be done to rule out more serious types of fungal sinus disease. How is this treated? Treatment for sinusitis depends on the cause and whether your  condition is chronic or acute. If a virus is causing your sinusitis, your symptoms will go away on their own within 10 days. You may be given medicines to relieve your symptoms, including:  Topical nasal decongestants. They shrink swollen nasal passages and let mucus drain from your sinuses.  Antihistamines. These drugs block inflammation that is triggered by allergies. This can help to ease swelling in your  nose and sinuses.  Topical nasal corticosteroids. These are nasal sprays that ease inflammation and swelling in your nose and sinuses.  Nasal saline washes. These rinses can help to get rid of thick mucus in your nose. If your condition is caused by bacteria, you will be given an antibiotic medicine. If your condition is caused by a fungus, you will be given an antifungal medicine. Surgery may be needed to correct underlying conditions, such as narrow nasal passages. Surgery may also be needed to remove polyps. Follow these instructions at home: Medicines  Take, use, or apply over-the-counter and prescription medicines only as told by your health care provider. These may include nasal sprays.  If you were prescribed an antibiotic medicine, take it as told by your health care provider. Do not stop taking the antibiotic even if you start to feel better. Hydrate and Humidify  Drink enough water to keep your urine clear or pale yellow. Staying hydrated will help to thin your mucus.  Use a cool mist humidifier to keep the humidity level in your home above 50%.  Inhale steam for 10-15 minutes, 3-4 times a day or as told by your health care provider. You can do this in the bathroom while a hot shower is running.  Limit your exposure to cool or dry air. Rest  Rest as much as possible.  Sleep with your head raised (elevated).  Make sure to get enough sleep each night. General instructions  Apply a warm, moist washcloth to your face 3-4 times a day or as told by your health care provider. This will help with discomfort.  Wash your hands often with soap and water to reduce your exposure to viruses and other germs. If soap and water are not available, use hand sanitizer.  Do not smoke. Avoid being around people who are smoking (secondhand smoke).  Keep all follow-up visits as told by your health care provider. This is important. Contact a health care provider if:  You have a  fever.  Your symptoms get worse.  Your symptoms do not improve within 10 days. Get help right away if:  You have a severe headache.  You have persistent vomiting.  You have pain or swelling around your face or eyes.  You have vision problems.  You develop confusion.  Your neck is stiff.  You have trouble breathing. This information is not intended to replace advice given to you by your health care provider. Make sure you discuss any questions you have with your health care provider. Document Released: 02/24/2005 Document Revised: 10/21/2015 Document Reviewed: 12/20/2014 Elsevier Interactive Patient Education  2017 [employer].

## 2016-03-07 NOTE — Assessment & Plan Note (Signed)
Encouraged DASH diet, decrease po intake and increase exercise as tolerated. Needs 7-8 hours of sleep nightly. Avoid trans fats, eat small, frequent meals every 4-5 hours with lean proteins, complex carbs and healthy fats. Minimize simple carbs 

## 2016-03-07 NOTE — Assessment & Plan Note (Signed)
Not well controlled, no changes to meds. Encouraged heart healthy diet such as the DASH diet and exercise as tolerated. Add Metoprolol XR 25 mg tab daily

## 2016-03-07 NOTE — Assessment & Plan Note (Signed)
Encouraged daily antihistamines during acute illness.

## 2016-03-07 NOTE — Progress Notes (Signed)
Patient ID: Sandra [name], female   DOB: 29-Jun-1960, 55 y.o.   MRN: [id number]   Subjective:    Patient ID: Sandra [name], female    DOB: 03-Apr-1960, 55 y.o.   MRN: [id number]  Chief Complaint  Patient presents with  . Hospitalization Follow-up    HPI Patient is in today for follow up patient c/o headaches, congestions   Past Medical History:  Diagnosis Date  . Allergic rhinitis 05/31/2012  . Chest pain    PT SAW CARDIOLOGIST DR. Burt Knack - STRESS ECHO DONE NEGATIVE - NO PROBLEM SINCE  . Chicken pox as achild  . Cough 11/29/2012   RESOLVED - IT WAS CAUSED BY LISINOPRIL  . Elevated LFTs 05/31/2012  . GERD (gastroesophageal reflux disease)   . H/O tobacco use, presenting hazards to health 11/29/2012   Smoked 1 1/2 ppd for 20 years quit in roughtly 2007  . Headache 11/25/2015  . Hematuria 04/09/2014  . Hoarseness 02/04/2012   RESOLVED - THOUGHT TO HAVE BEEN ALLERY RELATED  . Hyperglycemia 04/06/2014  . Hyperlipidemia   . Hyperparathyroidism ([place])   . Hypertension   . Obesity, unspecified 05/31/2012  . Ocular migraine   . Overactive bladder 05/31/2012  . Preventative health care 02/04/2012  . Rapid heart beat    PT FEELS PROB RELATED TO HYPER PARATHYROIDISM  . SUI (stress urinary incontinence, female) 02/04/2012   Sees Dr Perlie Gold and they have discussed a bladder tack but so far she declines    Past Surgical History:  Procedure Laterality Date  . BREAST SURGERY     left breast excision of adenoma  . btl    . PARATHYROIDECTOMY Right 08/12/2013   Procedure: right inferior frozen section PARATHYROIDECTOMY;  Surgeon: Earnstine Regal, MD;  Location: [facility];  Service: General;  Laterality: Right;  . PELVIC LAPAROSCOPY  03-2004  . TUBAL LIGATION    . WISDOM TOOTH EXTRACTION  30 yrs ago    Family History  Problem Relation Age of Onset  . Hypertension Mother 7    alive  . Renal cancer Mother     renal  . Hyperlipidemia Mother   . Hypertension Father 30    alive  . Atrial  fibrillation Father   . Colon cancer Father     colon  . Hyperlipidemia Father   . Hypertension Brother 80    alive  . Hypertension Sister   . Lung cancer Maternal Grandfather     lung/ smoker  . Emphysema Maternal Grandfather   . Heart disease Paternal Grandmother   . Other Paternal Grandfather     black lung  . Heart disease Paternal Grandfather   . Hypertension Sister   . Heart disease Paternal Aunt   . Heart disease Paternal Uncle   . COPD Sister     Social History   Social History  . Marital status: Married    Spouse name: N/A  . Number of children: 2  . Years of education: N/A   Occupational History  . RN   .  [employer]   Social History Main Topics  . Smoking status: Former Smoker    Packs/day: 1.00    Years: 20.00    Types: Cigarettes    Quit date: 03/10/2005  . Smokeless tobacco: Never Used  . Alcohol use Yes     Comment: very rarely  . Drug use: No  . Sexual activity: Yes    Partners: Male   Other Topics Concern  . Not on file  Social History Narrative  . No narrative on file    Outpatient Medications Prior to Visit  Medication Sig Dispense Refill  . aspirin EC 81 MG tablet Take 81 mg by mouth at bedtime.    Marland Kitchen atorvastatin (LIPITOR) 10 MG tablet TAKE 1/2 TABLET BY MOUTH EVERY OTHER DAY 15 tablet 0  . Cholecalciferol (VITAMIN D PO) Take 2,000 mg by mouth daily. Vitamin D    . furosemide (LASIX) 20 MG tablet Take 1 tablet by mouth  daily 90 tablet 1  . lactobacillus acidophilus (BACID) TABS tablet Take 2 tablets by mouth 3 (three) times daily.    Marland Kitchen losartan (COZAAR) 50 MG tablet Take 1 tablet by mouth  daily 90 tablet 1  . omeprazole (PRILOSEC) 40 MG capsule Take 1 capsule by mouth  daily 90 capsule 1  . OVER THE COUNTER MEDICATION Take 1 capsule by mouth daily. Mega red- 1 capsule daily     No facility-administered medications prior to visit.     Allergies  Allergen Reactions  . Cefdinir Other (See Comments)    Severe Stomach cramps  and diarrhea for several days.  . Doxycycline Diarrhea  . Codeine Nausea And Vomiting  . Penicillins     RASH, ITCHING  . Simvastatin     myalgias    Review of Systems  Constitutional: Positive for chills and malaise/fatigue. Negative for fever.  HENT: Positive for congestion.   Eyes: Negative for blurred vision.  Respiratory: Negative for cough and shortness of breath.   Cardiovascular: Negative for chest pain, palpitations and leg swelling.  Gastrointestinal: Negative for vomiting.  Musculoskeletal: Negative for back pain.  Skin: Negative for rash.  Neurological: Positive for headaches. Negative for loss of consciousness.       Objective:    Physical Exam  Constitutional: She is oriented to person, place, and time. She appears well-developed and well-nourished. No distress.  HENT:  Head: Normocephalic and atraumatic.  Nasal mucosa boggy and erythematous  Eyes: Conjunctivae are normal.  Neck: Normal range of motion. No thyromegaly present.  Cardiovascular: Normal rate and regular rhythm.   Pulmonary/Chest: Effort normal and breath sounds normal. She has no wheezes.  Abdominal: Soft. Bowel sounds are normal. There is no tenderness.  Musculoskeletal: Normal range of motion. She exhibits no edema or deformity.  Neurological: She is alert and oriented to person, place, and time.  Skin: Skin is warm and dry. She is not diaphoretic.  Psychiatric: She has a normal mood and affect.    BP (!) 158/98 (BP Location: Left Arm, Patient Position: Sitting, Cuff Size: Normal)   Pulse 80   Temp 98.2 F (36.8 C) (Oral)   Wt 200 lb 6.4 oz (90.9 kg)   LMP 04/01/2011   SpO2 96%   BMI 33.35 kg/m  Wt Readings from Last 3 Encounters:  03/07/16 200 lb 6.4 oz (90.9 kg)  12/14/15 197 lb (89.4 kg)  11/20/15 193 lb 6 oz (87.7 kg)     Lab Results  Component Value Date   WBC 6.6 09/12/2015   HGB 13.5 09/12/2015   HCT 40.5 09/12/2015   PLT 296.0 09/12/2015   GLUCOSE 107 (H) 09/12/2015     CHOL 286 (H) 12/14/2015   TRIG 208.0 (H) 12/14/2015   HDL 53.10 12/14/2015   LDLDIRECT 201.0 12/14/2015   LDLCALC 160 (H) 09/12/2015   ALT 19 09/12/2015   AST 17 09/12/2015   NA 139 09/12/2015   K 4.7 09/12/2015   CL 104 09/12/2015   CREATININE 0.75  09/12/2015   BUN 22 09/12/2015   CO2 30 09/12/2015   TSH 4.14 09/12/2015   HGBA1C 5.7 09/12/2015    Lab Results  Component Value Date   TSH 4.14 09/12/2015   Lab Results  Component Value Date   WBC 6.6 09/12/2015   HGB 13.5 09/12/2015   HCT 40.5 09/12/2015   MCV 87.7 09/12/2015   PLT 296.0 09/12/2015   Lab Results  Component Value Date   NA 139 09/12/2015   K 4.7 09/12/2015   CO2 30 09/12/2015   GLUCOSE 107 (H) 09/12/2015   BUN 22 09/12/2015   CREATININE 0.75 09/12/2015   BILITOT 0.3 09/12/2015   ALKPHOS 84 09/12/2015   AST 17 09/12/2015   ALT 19 09/12/2015   PROT 7.4 09/12/2015   ALBUMIN 4.2 09/12/2015   CALCIUM 9.9 09/12/2015   GFR 85.71 09/12/2015   Lab Results  Component Value Date   CHOL 286 (H) 12/14/2015   Lab Results  Component Value Date   HDL 53.10 12/14/2015   Lab Results  Component Value Date   LDLCALC 160 (H) 09/12/2015   Lab Results  Component Value Date   TRIG 208.0 (H) 12/14/2015   Lab Results  Component Value Date   CHOLHDL 5 12/14/2015   Lab Results  Component Value Date   HGBA1C 5.7 09/12/2015   I acted as a Education administrator for Dr. Charlett Blake. Woodrow Dulski, RMA     Assessment & Plan:   Problem List Items Addressed This Visit    None      I am having Ms. Rentfrow start on metoprolol succinate and azithromycin. I am also having her maintain her Cholecalciferol (VITAMIN D PO), OVER THE COUNTER MEDICATION, aspirin EC, furosemide, losartan, omeprazole, lactobacillus acidophilus, atorvastatin, Co-Enzyme Q-10, DOCOSAHEXAENOIC ACID PO, verapamil, and losartan.  Meds ordered this encounter  Medications  . Co-Enzyme Q-10 30 MG CAPS    Sig: Take 100 mg by mouth at bedtime.  . DOCOSAHEXAENOIC  ACID PO    Sig: Take 1 tablet by mouth daily.  . verapamil (CALAN-SR) 120 MG CR tablet    Sig: Take 120 mg by mouth at bedtime.  Marland Kitchen losartan (COZAAR) 50 MG tablet    Sig: Take 25 mg by mouth at bedtime.  . metoprolol succinate (TOPROL-XL) 25 MG 24 hr tablet    Sig: Take 1 tablet (25 mg total) by mouth daily.    Dispense:  90 tablet    Refill:  3  . azithromycin (ZITHROMAX Z-PAK) 250 MG tablet    Sig: 2 tab by mouth one day the 1 tablet by mouth for 4 days    Dispense:  6 each    Refill:  0     CMA served as Education administrator during this encounter. History, Physical and Plan were performed by MD Magdalene Molly, RMA

## 2016-03-07 NOTE — Assessment & Plan Note (Signed)
Encouraged increased rest and hydration, add probiotics, zinc such as Coldeze or Xicam. Treat fevers as needed. Elderberry, vitamin C, aged garlic.start Azithromycin

## 2016-03-21 ENCOUNTER — Ambulatory Visit (INDEPENDENT_AMBULATORY_CARE_PROVIDER_SITE_OTHER): Payer: 59 | Admitting: Family Medicine

## 2016-03-21 VITALS — BP 137/82 | HR 80

## 2016-03-21 DIAGNOSIS — I10 Essential (primary) hypertension: Secondary | ICD-10-CM | POA: Diagnosis not present

## 2016-03-21 NOTE — Patient Instructions (Addendum)
Per Dr. Charlett Blake: Continue current medications & regimen. Call the office if you notice any new symptoms or changes. Keep appointment with PCP on 06/24/16 at 7:15 AM.

## 2016-03-21 NOTE — Progress Notes (Signed)
RN blood pressure check note reviewed. Agree with documention and plan. 

## 2016-03-21 NOTE — Progress Notes (Signed)
Pre visit review using our clinic review tool, if applicable. No additional management support is needed unless otherwise documented below in the visit note.  Patient came in office today for a blood pressure check. Dr. Charlett Blake gave verbal order. Reviewed medications & regimen with the patient. Readings were as follow: BP 145/88 P 85 & BP 137/82 P 80.  Per Dr. Charlett Blake: Continue current medications & regimen. Call the office if you notice any new symptoms or changes. Keep appointment with PCP on 06/24/16 at 7:15 AM.  Patient was made aware of the provider's recommendations and voiced understanding. She did not have any further concerns before leaving the nurse visit.  RN blood check note reviewed. Agree with documention and plan.  RN blood pressure check note reviewed. Agree with documention and plan.

## 2016-04-07 ENCOUNTER — Other Ambulatory Visit: Payer: Self-pay | Admitting: Family Medicine

## 2016-04-07 MED ORDER — METOPROLOL SUCCINATE ER 25 MG PO TB24: 25.0000 mg | ORAL_TABLET | Freq: Every day | ORAL | 3 refills | Status: DC

## 2016-04-07 NOTE — Telephone Encounter (Signed)
Patient is requesting a refill of metoprolol succinate (TOPROL-XL) 25 MG 24 hr tablet Please advise  Pharmacy: [facility], [address]

## 2016-04-07 NOTE — Telephone Encounter (Signed)
Medication sent to pharmacy    PC 

## 2016-04-08 ENCOUNTER — Other Ambulatory Visit: Payer: Self-pay | Admitting: Family Medicine

## 2016-04-30 ENCOUNTER — Other Ambulatory Visit: Payer: Self-pay | Admitting: Family Medicine

## 2016-05-21 ENCOUNTER — Other Ambulatory Visit: Payer: Self-pay | Admitting: Family Medicine

## 2016-06-20 ENCOUNTER — Other Ambulatory Visit (INDEPENDENT_AMBULATORY_CARE_PROVIDER_SITE_OTHER): Payer: 59

## 2016-06-20 DIAGNOSIS — R739 Hyperglycemia, unspecified: Secondary | ICD-10-CM | POA: Diagnosis not present

## 2016-06-20 DIAGNOSIS — I10 Essential (primary) hypertension: Secondary | ICD-10-CM

## 2016-06-20 DIAGNOSIS — R946 Abnormal results of thyroid function studies: Secondary | ICD-10-CM | POA: Diagnosis not present

## 2016-06-20 LAB — HEMOGLOBIN A1C: Hgb A1c MFr Bld: 6.3 % (ref 4.6–6.5)

## 2016-06-20 LAB — COMPREHENSIVE METABOLIC PANEL
ALT: 28 U/L (ref 0–35)
AST: 19 U/L (ref 0–37)
Albumin: 4.1 g/dL (ref 3.5–5.2)
Alkaline Phosphatase: 98 U/L (ref 39–117)
BUN: 20 mg/dL (ref 6–23)
CO2: 33 mEq/L — ABNORMAL HIGH (ref 19–32)
Calcium: 9.7 mg/dL (ref 8.4–10.5)
Chloride: 100 mEq/L (ref 96–112)
Creatinine, Ser: 0.71 mg/dL (ref 0.40–1.20)
GFR: 91.04 mL/min (ref >60.00)
Glucose, Bld: 104 mg/dL — ABNORMAL HIGH (ref 70–99)
Potassium: 4.7 mEq/L (ref 3.5–5.1)
Sodium: 139 mEq/L (ref 135–145)
Total Bilirubin: 0.3 mg/dL (ref 0.2–1.2)
Total Protein: 7.4 g/dL (ref 6.0–8.3)

## 2016-06-20 LAB — CBC
HCT: 41.2 % (ref 36.0–46.0)
Hemoglobin: 13.6 g/dL (ref 12.0–15.0)
MCHC: 33 g/dL (ref 30.0–36.0)
MCV: 88.1 fl (ref 78.0–100.0)
Platelets: 278 10*3/uL (ref 150.0–400.0)
RBC: 4.68 Mil/uL (ref 3.87–5.11)
RDW: 13.9 % (ref 11.5–15.5)
WBC: 6.6 10*3/uL (ref 4.0–10.5)

## 2016-06-20 LAB — T3, FREE: T3, Free: 4.2 pg/mL (ref 2.3–4.2)

## 2016-06-20 LAB — T4, FREE: Free T4: 0.69 ng/dL (ref 0.60–1.60)

## 2016-06-20 LAB — TSH: TSH: 5.34 u[IU]/mL — ABNORMAL HIGH (ref 0.35–4.50)

## 2016-06-24 ENCOUNTER — Other Ambulatory Visit: Payer: Self-pay | Admitting: Family Medicine

## 2016-06-24 ENCOUNTER — Ambulatory Visit (INDEPENDENT_AMBULATORY_CARE_PROVIDER_SITE_OTHER): Payer: 59 | Admitting: Family Medicine

## 2016-06-24 ENCOUNTER — Encounter: Payer: Self-pay | Admitting: Family Medicine

## 2016-06-24 VITALS — BP 138/90 | HR 72 | Temp 98.0°F | Resp 16 | Ht 65.0 in | Wt 205.6 lb

## 2016-06-24 DIAGNOSIS — R739 Hyperglycemia, unspecified: Secondary | ICD-10-CM

## 2016-06-24 DIAGNOSIS — E782 Mixed hyperlipidemia: Secondary | ICD-10-CM | POA: Diagnosis not present

## 2016-06-24 DIAGNOSIS — E6609 Other obesity due to excess calories: Secondary | ICD-10-CM | POA: Diagnosis not present

## 2016-06-24 DIAGNOSIS — Z0000 Encounter for general adult medical examination without abnormal findings: Secondary | ICD-10-CM | POA: Diagnosis not present

## 2016-06-24 DIAGNOSIS — E210 Primary hyperparathyroidism: Secondary | ICD-10-CM

## 2016-06-24 DIAGNOSIS — I10 Essential (primary) hypertension: Secondary | ICD-10-CM

## 2016-06-24 LAB — LIPID PANEL
Cholesterol: 224 mg/dL — ABNORMAL HIGH (ref 0–200)
HDL: 55.6 mg/dL (ref >39.00)
LDL Cholesterol: 136 mg/dL — ABNORMAL HIGH (ref 0–99)
NonHDL: 168.16
Total CHOL/HDL Ratio: 4
Triglycerides: 159 mg/dL — ABNORMAL HIGH (ref 0.0–149.0)
VLDL: 31.8 mg/dL (ref 0.0–40.0)

## 2016-06-24 MED ORDER — ATORVASTATIN CALCIUM 20 MG PO TABS: 20.0000 mg | ORAL_TABLET | Freq: Every day | ORAL | 1 refills | Status: DC

## 2016-06-24 NOTE — Assessment & Plan Note (Signed)
Well controlled, no changes to meds. Encouraged heart healthy diet such as the DASH diet and exercise as tolerated.  °

## 2016-06-24 NOTE — Progress Notes (Signed)
Pre visit review using our clinic review tool, if applicable. No additional management support is needed unless otherwise documented below in the visit note. 

## 2016-06-24 NOTE — Assessment & Plan Note (Signed)
Patient encouraged to maintain heart healthy diet, regular exercise, adequate sleep. Consider daily probiotics. Take medications as prescribed 

## 2016-06-24 NOTE — Assessment & Plan Note (Signed)
Encouraged DASH diet, decrease po intake and increase exercise as tolerated. Needs 7-8 hours of sleep nightly. Avoid trans fats, eat small, frequent meals every 4-5 hours with lean proteins, complex carbs and healthy fats. Minimize simple carbs, referred to bariatrics 

## 2016-06-24 NOTE — Progress Notes (Signed)
Patient ID: Sandra [name], female   DOB: 1960/04/23, 56 y.o.   MRN: [id number]   Subjective:    Patient ID: Sandra [name], female    DOB: 1960/12/03, 56 y.o.   MRN: [id number]  Chief Complaint  Patient presents with  . Annual Exam    HPI Patient is in today for Annual preventative exam and follow-up on chronic medical conditions including hypertension, hyperlipidemia, hyperglycemia and more. She has been under a great deal stress secondary to ailing parents in [place]. Her father has had a disabling stroke and is in a nursing home and her mother's health continues to decline. She has been going to [place] to help out her sister. As a result of all the stress she acknowledges she has not been exercising, eating well or taking care of herself over the winter. As a result she is very frustrated with her weight gain. No recent febrile illness or hospitalization. Denies CP/palp/SOB/HA/congestion/fevers/GI or GU c/o. Taking meds as prescribed  Past Medical History:  Diagnosis Date  . Allergic rhinitis 05/31/2012  . Chest pain    PT SAW CARDIOLOGIST DR. Burt Knack - STRESS ECHO DONE NEGATIVE - NO PROBLEM SINCE  . Chicken pox as achild  . Cough 11/29/2012   RESOLVED - IT WAS CAUSED BY LISINOPRIL  . Elevated LFTs 05/31/2012  . GERD (gastroesophageal reflux disease)   . H/O tobacco use, presenting hazards to health 11/29/2012   Smoked 1 1/2 ppd for 20 years quit in roughtly 2007  . Headache 11/25/2015  . Hematuria 04/09/2014  . Hoarseness 02/04/2012   RESOLVED - THOUGHT TO HAVE BEEN ALLERY RELATED  . Hyperglycemia 04/06/2014  . Hyperlipidemia   . Hyperparathyroidism ([place])   . Hypertension   . Obesity, unspecified 05/31/2012  . Ocular migraine   . Overactive bladder 05/31/2012  . Preventative health care 02/04/2012  . Rapid heart beat    PT FEELS PROB RELATED TO HYPER PARATHYROIDISM  . SUI (stress urinary incontinence, female) 02/04/2012   Sees Dr Perlie Gold and they have  discussed a bladder tack but so far she declines    Past Surgical History:  Procedure Laterality Date  . BREAST SURGERY     left breast excision of adenoma  . btl    . PARATHYROIDECTOMY Right 08/12/2013   Procedure: right inferior frozen section PARATHYROIDECTOMY;  Surgeon: Earnstine Regal, MD;  Location: [facility];  Service: General;  Laterality: Right;  . PELVIC LAPAROSCOPY  03-2004  . TUBAL LIGATION    . WISDOM TOOTH EXTRACTION  30 yrs ago    Family History  Problem Relation Age of Onset  . Hypertension Mother 20    alive  . Renal cancer Mother     renal  . Hyperlipidemia Mother   . Osteoporosis Mother     humerus   . Macular degeneration Mother   . Tremor Mother   . Hypertension Father 60    alive  . Atrial fibrillation Father   . Colon cancer Father     colon  . Hyperlipidemia Father   . Stroke Father   . Hypertension Brother 66    alive  . Hypertension Sister   . Lung cancer Maternal Grandfather     lung/ smoker  . Emphysema Maternal Grandfather   . Heart disease Paternal Grandmother   . Other Paternal Grandfather     black lung  . Heart disease Paternal Grandfather   . Heart disease Paternal Aunt   . Heart disease Paternal Uncle  Social History   Social History  . Marital status: Married    Spouse name: N/A  . Number of children: 2  . Years of education: N/A   Occupational History  . RN   .  [employer]   Social History Main Topics  . Smoking status: Former Smoker    Packs/day: 1.00    Years: 20.00    Types: Cigarettes    Quit date: 03/10/2005  . Smokeless tobacco: Never Used  . Alcohol use Yes     Comment: very rarely  . Drug use: No  . Sexual activity: Yes    Partners: Male   Other Topics Concern  . Not on file   Social History Narrative  . No narrative on file    Outpatient Medications Prior to Visit  Medication Sig Dispense Refill  . aspirin EC 81 MG tablet Take 81 mg by mouth at bedtime.    Marland Kitchen atorvastatin (LIPITOR) 10 MG  tablet TAKE 1 TABLET BY MOUTH  DAILY 90 tablet 1  . Cholecalciferol (VITAMIN D PO) Take 2,000 mg by mouth daily. Vitamin D    . Co-Enzyme Q-10 30 MG CAPS Take 100 mg by mouth at bedtime.    . DOCOSAHEXAENOIC ACID PO Take 1 tablet by mouth daily.    . furosemide (LASIX) 20 MG tablet TAKE 1 TABLET BY MOUTH  DAILY 90 tablet 1  . lactobacillus acidophilus (BACID) TABS tablet Take 2 tablets by mouth 3 (three) times daily.    Marland Kitchen losartan (COZAAR) 50 MG tablet TAKE 1 TABLET BY MOUTH  DAILY 90 tablet 1  . metoprolol succinate (TOPROL-XL) 25 MG 24 hr tablet Take 1 tablet (25 mg total) by mouth daily. 90 tablet 3  . omeprazole (PRILOSEC) 40 MG capsule TAKE 1 CAPSULE BY MOUTH  DAILY 90 capsule 1  . OVER THE COUNTER MEDICATION Take 1 capsule by mouth daily. Mega red- 1 capsule daily    . verapamil (CALAN-SR) 120 MG CR tablet TAKE 1 TABLET BY MOUTH AT  BEDTIME 90 tablet 1  . azithromycin (ZITHROMAX Z-PAK) 250 MG tablet 2 tab by mouth one day the 1 tablet by mouth for 4 days (Patient not taking: Reported on 06/24/2016) 6 each 0   No facility-administered medications prior to visit.     Allergies  Allergen Reactions  . Cefdinir Other (See Comments)    Severe Stomach cramps and diarrhea for several days.  . Doxycycline Diarrhea  . Codeine Nausea And Vomiting  . Penicillins     RASH, ITCHING  . Simvastatin     myalgias    Review of Systems  Constitutional: Positive for malaise/fatigue. Negative for chills and fever.  HENT: Negative for congestion and hearing loss.   Eyes: Negative for discharge.  Respiratory: Negative for cough, sputum production and shortness of breath.   Cardiovascular: Negative for chest pain, palpitations and leg swelling.  Gastrointestinal: Negative for abdominal pain, blood in stool, constipation, diarrhea, heartburn, nausea and vomiting.  Genitourinary: Negative for dysuria, frequency, hematuria and urgency.  Musculoskeletal: Negative for back pain, falls and myalgias.    Skin: Negative for rash.  Neurological: Negative for dizziness, sensory change, loss of consciousness, weakness and headaches.  Endo/Heme/Allergies: Negative for environmental allergies. Does not bruise/bleed easily.  Psychiatric/Behavioral: Positive for depression. Negative for suicidal ideas. The patient is nervous/anxious. The patient does not have insomnia.        Objective:    Physical Exam  Constitutional: She is oriented to person, place, and time. She appears  well-developed and well-nourished. No distress.  HENT:  Head: Normocephalic and atraumatic.  Eyes: Conjunctivae are normal.  Neck: Neck supple. No thyromegaly present.  Cardiovascular: Normal rate, regular rhythm and normal heart sounds.   No murmur heard. Pulmonary/Chest: Effort normal and breath sounds normal. No respiratory distress.  Abdominal: Soft. Bowel sounds are normal. She exhibits no distension and no mass. There is no tenderness.  Musculoskeletal: She exhibits no edema.  Lymphadenopathy:    She has no cervical adenopathy.  Neurological: She is alert and oriented to person, place, and time.  Skin: Skin is warm and dry.  Psychiatric: She has a normal mood and affect. Her behavior is normal.    BP 138/90 (BP Location: Right Arm, Patient Position: Sitting, Cuff Size: Normal)   Pulse 72   Temp 98 F (36.7 C) (Oral)   Resp 16   Ht 5\' 5"  (1.651 m)   Wt 205 lb 9.6 oz (93.3 kg)   LMP 04/01/2011   SpO2 97%   BMI 34.21 kg/m  Wt Readings from Last 3 Encounters:  06/24/16 205 lb 9.6 oz (93.3 kg)  03/07/16 200 lb 6.4 oz (90.9 kg)  12/14/15 197 lb (89.4 kg)     Lab Results  Component Value Date   WBC 6.6 06/20/2016   HGB 13.6 06/20/2016   HCT 41.2 06/20/2016   PLT 278.0 06/20/2016   GLUCOSE 104 (H) 06/20/2016   CHOL 224 (H) 06/24/2016   TRIG 159.0 (H) 06/24/2016   HDL 55.60 06/24/2016   LDLDIRECT 201.0 12/14/2015   LDLCALC 136 (H) 06/24/2016   ALT 28 06/20/2016   AST 19 06/20/2016   NA 139  06/20/2016   K 4.7 06/20/2016   CL 100 06/20/2016   CREATININE 0.71 06/20/2016   BUN 20 06/20/2016   CO2 33 (H) 06/20/2016   TSH 5.34 (H) 06/20/2016   HGBA1C 6.3 06/20/2016    Lab Results  Component Value Date   TSH 5.34 (H) 06/20/2016   Lab Results  Component Value Date   WBC 6.6 06/20/2016   HGB 13.6 06/20/2016   HCT 41.2 06/20/2016   MCV 88.1 06/20/2016   PLT 278.0 06/20/2016   Lab Results  Component Value Date   NA 139 06/20/2016   K 4.7 06/20/2016   CO2 33 (H) 06/20/2016   GLUCOSE 104 (H) 06/20/2016   BUN 20 06/20/2016   CREATININE 0.71 06/20/2016   BILITOT 0.3 06/20/2016   ALKPHOS 98 06/20/2016   AST 19 06/20/2016   ALT 28 06/20/2016   PROT 7.4 06/20/2016   ALBUMIN 4.1 06/20/2016   CALCIUM 9.7 06/20/2016   GFR 91.04 06/20/2016   Lab Results  Component Value Date   CHOL 224 (H) 06/24/2016   Lab Results  Component Value Date   HDL 55.60 06/24/2016   Lab Results  Component Value Date   LDLCALC 136 (H) 06/24/2016   Lab Results  Component Value Date   TRIG 159.0 (H) 06/24/2016   Lab Results  Component Value Date   CHOLHDL 4 06/24/2016   Lab Results  Component Value Date   HGBA1C 6.3 06/20/2016       Assessment & Plan:   Problem List Items Addressed This Visit    Hypertension - Primary    Well controlled, no changes to meds. Encouraged heart healthy diet such as the DASH diet and exercise as tolerated.       Relevant Orders   CBC   Comprehensive metabolic panel   TSH   Hyperlipidemia  Encouraged heart healthy diet, increase exercise, avoid trans fats, consider a krill oil cap daily      Relevant Orders   Lipid panel (Completed)   Lipid panel   Preventative health care    Patient encouraged to maintain heart healthy diet, regular exercise, adequate sleep. Consider daily probiotics. Take medications as prescribed      Obesity    Encouraged DASH diet, decrease po intake and increase exercise as tolerated. Needs 7-8 hours of sleep  nightly. Avoid trans fats, eat small, frequent meals every 4-5 hours with lean proteins, complex carbs and healthy fats. Minimize simple carbs, referred to bariatrics      Hyperparathyroidism, primary ([place])    Check pth      Hyperglycemia    hgba1c acceptable, minimize simple carbs. Increase exercise as tolerated.       Relevant Orders   Hemoglobin A1c      I am having Ms. Tokunaga maintain her Cholecalciferol (VITAMIN D PO), OVER THE COUNTER MEDICATION, aspirin EC, lactobacillus acidophilus, Co-Enzyme Q-10, DOCOSAHEXAENOIC ACID PO, azithromycin, metoprolol succinate, atorvastatin, verapamil, losartan, furosemide, and omeprazole.  No orders of the defined types were placed in this encounter.   CMA served as Education administrator during this visit. History, Physical and Plan performed by medical provider. Documentation and orders reviewed and attested to.  Penni Homans, MD

## 2016-06-24 NOTE — Assessment & Plan Note (Signed)
hgba1c acceptable, minimize simple carbs. Increase exercise as tolerated.  

## 2016-06-24 NOTE — Patient Instructions (Signed)
DASH diet or MIND diet    Carbohydrate Counting for Diabetes Mellitus, Adult Carbohydrate counting is a method for keeping track of how many carbohydrates you eat. Eating carbohydrates naturally increases the amount of sugar (glucose) in the blood. Counting how many carbohydrates you eat helps keep your blood glucose within normal limits, which helps you manage your diabetes (diabetes mellitus). It is important to know how many carbohydrates you can safely have in each meal. This is different for every person. A diet and nutrition specialist (registered dietitian) can help you make a meal plan and calculate how many carbohydrates you should have at each meal and snack. Carbohydrates are found in the following foods:  Grains, such as breads and cereals.  Dried beans and soy products.  Starchy vegetables, such as potatoes, peas, and corn.  Fruit and fruit juices.  Milk and yogurt.  Sweets and snack foods, such as cake, cookies, candy, chips, and soft drinks. How do I count carbohydrates? There are two ways to count carbohydrates in food. You can use either of the methods or a combination of both. Reading "Nutrition Facts" on packaged food  The "Nutrition Facts" list is included on the labels of almost all packaged foods and beverages in the [place] It includes:  The serving size.  Information about nutrients in each serving, including the grams (g) of carbohydrate per serving. To use the "Nutrition Facts":  Decide how many servings you will have.  Multiply the number of servings by the number of carbohydrates per serving.  The resulting number is the total amount of carbohydrates that you will be having. Learning standard serving sizes of other foods  When you eat foods containing carbohydrates that are not packaged or do not include "Nutrition Facts" on the label, you need to measure the servings in order to count the amount of carbohydrates:  Measure the foods that you will eat with  a food scale or measuring cup, if needed.  Decide how many standard-size servings you will eat.  Multiply the number of servings by 15. Most carbohydrate-rich foods have about 15 g of carbohydrates per serving.  For example, if you eat 8 oz (170 g) of strawberries, you will have eaten 2 servings and 30 g of carbohydrates (2 servings x 15 g = 30 g).  For foods that have more than one food mixed, such as soups and casseroles, you must count the carbohydrates in each food that is included. The following list contains standard serving sizes of common carbohydrate-rich foods. Each of these servings has about 15 g of carbohydrates:   hamburger bun or  English muffin.   oz (15 mL) syrup.   oz (14 g) jelly.  1 slice of bread.  1 six-inch tortilla.  3 oz (85 g) cooked rice or pasta.  4 oz (113 g) cooked dried beans.  4 oz (113 g) starchy vegetable, such as peas, corn, or potatoes.  4 oz (113 g) hot cereal.  4 oz (113 g) mashed potatoes or  of a large baked potato.  4 oz (113 g) canned or frozen fruit.  4 oz (120 mL) fruit juice.  4-6 crackers.  6 chicken nuggets.  6 oz (170 g) unsweetened dry cereal.  6 oz (170 g) plain fat-free yogurt or yogurt sweetened with artificial sweeteners.  8 oz (240 mL) milk.  8 oz (170 g) fresh fruit or one small piece of fruit.  24 oz (680 g) popped popcorn. Example of carbohydrate counting Sample meal  3 oz (85 g) chicken breast.  6 oz (170 g) brown rice.  4 oz (113 g) corn.  8 oz (240 mL) milk.  8 oz (170 g) strawberries with sugar-free whipped topping. Carbohydrate calculation  1. Identify the foods that contain carbohydrates:  Rice.  Corn.  Milk.  Strawberries. 2. Calculate how many servings you have of each food:  2 servings rice.  1 serving corn.  1 serving milk.  1 serving strawberries. 3. Multiply each number of servings by 15 g:  2 servings rice x 15 g = 30 g.  1 serving corn x 15 g = 15 g.  1  serving milk x 15 g = 15 g.  1 serving strawberries x 15 g = 15 g. 4. Add together all of the amounts to find the total grams of carbohydrates eaten:  30 g + 15 g + 15 g + 15 g = 75 g of carbohydrates total. This information is not intended to replace advice given to you by your health care provider. Make sure you discuss any questions you have with your health care provider. Document Released: 02/24/2005 Document Revised: 09/14/2015 Document Reviewed: 08/08/2015 Elsevier Interactive Patient Education  2017 [employer].

## 2016-06-24 NOTE — Assessment & Plan Note (Signed)
Check pth

## 2016-06-24 NOTE — Assessment & Plan Note (Signed)
Encouraged heart healthy diet, increase exercise, avoid trans fats, consider a krill oil cap daily 

## 2016-07-10 ENCOUNTER — Ambulatory Visit: Payer: 59 | Admitting: Sports Medicine

## 2016-09-04 ENCOUNTER — Encounter: Payer: Self-pay | Admitting: Family Medicine

## 2016-09-04 ENCOUNTER — Ambulatory Visit (INDEPENDENT_AMBULATORY_CARE_PROVIDER_SITE_OTHER): Payer: 59 | Admitting: Family Medicine

## 2016-09-04 VITALS — BP 124/84 | HR 80 | Temp 98.4°F | Ht 65.0 in | Wt 203.6 lb

## 2016-09-04 DIAGNOSIS — Z205 Contact with and (suspected) exposure to viral hepatitis: Secondary | ICD-10-CM | POA: Diagnosis not present

## 2016-09-04 DIAGNOSIS — Z23 Encounter for immunization: Secondary | ICD-10-CM | POA: Diagnosis not present

## 2016-09-04 LAB — HEPATITIS A ANTIBODY, IGM: Hep A IgM: NONREACTIVE

## 2016-09-04 NOTE — Patient Instructions (Addendum)
Give [place] 2-3 business days to get the results of your labs back.   Let us know if you need anything.   Your next vaccination is in 6 months. It can be after, but no sooner. After that, you will have acceptable immunity for 20 years (unless guidelines change).

## 2016-09-04 NOTE — Progress Notes (Signed)
Chief Complaint  Patient presents with  . Exposed to Hep A    pt ate at [facility] in [place]     Subjective: Patient is a 56 y.o. female here for Exposure to hepatitis A.  The patient was eating out at a restaurant in [place]. Her husband heard on the radio that the specific restaurant they ate at have been exposed to hepatitis A. They ate there 8 days ago. She is not having any symptoms such as nausea, vomiting, abdominal pain, or diarrhea. She was vaccinated for hepatitis A many years ago, however she is not sure of the exact date.   ROS: GI: As noted in HPI  Family History  Problem Relation Age of Onset  . Hypertension Mother 51       alive  . Renal cancer Mother        renal  . Hyperlipidemia Mother   . Osteoporosis Mother        humerus   . Macular degeneration Mother   . Tremor Mother   . Hypertension Father 60       alive  . Atrial fibrillation Father   . Colon cancer Father        colon  . Hyperlipidemia Father   . Stroke Father   . Hypertension Brother 60       alive  . Hypertension Sister   . Lung cancer Maternal Grandfather        lung/ smoker  . Emphysema Maternal Grandfather   . Heart disease Paternal Grandmother   . Other Paternal Grandfather        black lung  . Heart disease Paternal Grandfather   . Heart disease Paternal Aunt   . Heart disease Paternal Uncle    Past Medical History:  Diagnosis Date  . Allergic rhinitis 05/31/2012  . Chest pain    PT SAW CARDIOLOGIST DR. Burt Knack - STRESS ECHO DONE NEGATIVE - NO PROBLEM SINCE  . Chicken pox as achild  . Cough 11/29/2012   RESOLVED - IT WAS CAUSED BY LISINOPRIL  . Elevated LFTs 05/31/2012  . GERD (gastroesophageal reflux disease)   . H/O tobacco use, presenting hazards to health 11/29/2012   Smoked 1 1/2 ppd for 20 years quit in roughtly 2007  . Headache 11/25/2015  . Hematuria 04/09/2014  . Hoarseness 02/04/2012   RESOLVED - THOUGHT TO HAVE BEEN ALLERY RELATED  . Hyperglycemia 04/06/2014  .  Hyperlipidemia   . Hyperparathyroidism ([place])   . Hypertension   . Obesity, unspecified 05/31/2012  . Ocular migraine   . Overactive bladder 05/31/2012  . Preventative health care 02/04/2012  . Rapid heart beat    PT FEELS PROB RELATED TO HYPER PARATHYROIDISM  . SUI (stress urinary incontinence, female) 02/04/2012   Sees Dr Perlie Gold and they have discussed a bladder tack but so far she declines   Allergies  Allergen Reactions  . Cefdinir Other (See Comments)    Severe Stomach cramps and diarrhea for several days.  . Doxycycline Diarrhea  . Codeine Nausea And Vomiting  . Penicillins     RASH, ITCHING  . Simvastatin     myalgias    Current Outpatient Prescriptions:  .  aspirin EC 81 MG tablet, Take 81 mg by mouth at bedtime., Disp: , Rfl:  .  atorvastatin (LIPITOR) 20 MG tablet, Take 1 tablet (20 mg total) by mouth daily., Disp: 90 tablet, Rfl: 1 .  Cholecalciferol (VITAMIN D PO), Take 2,000 mg by mouth daily. Vitamin D, Disp: , Rfl:  .  Co-Enzyme Q-10 30 MG CAPS, Take 100 mg by mouth at bedtime., Disp: , Rfl:  .  DOCOSAHEXAENOIC ACID PO, Take 1 tablet by mouth daily., Disp: , Rfl:  .  furosemide (LASIX) 20 MG tablet, TAKE 1 TABLET BY MOUTH  DAILY, Disp: 90 tablet, Rfl: 1 .  lactobacillus acidophilus (BACID) TABS tablet, Take 2 tablets by mouth 3 (three) times daily., Disp: , Rfl:  .  losartan (COZAAR) 50 MG tablet, TAKE 1 TABLET BY MOUTH  DAILY, Disp: 90 tablet, Rfl: 1 .  metoprolol succinate (TOPROL-XL) 25 MG 24 hr tablet, Take 1 tablet (25 mg total) by mouth daily., Disp: 90 tablet, Rfl: 3 .  omeprazole (PRILOSEC) 40 MG capsule, TAKE 1 CAPSULE BY MOUTH  DAILY, Disp: 90 capsule, Rfl: 1 .  OVER THE COUNTER MEDICATION, Take 1 capsule by mouth daily. Mega red- 1 capsule daily, Disp: , Rfl:  .  verapamil (CALAN-SR) 120 MG CR tablet, TAKE 1 TABLET BY MOUTH AT  BEDTIME, Disp: 90 tablet, Rfl: 1  Objective: BP 124/84 (BP Location: Left Arm, Patient Position: Sitting, Cuff Size: Large)    Pulse 80   Temp 98.4 F (36.9 C) (Oral)   Ht 5\' 5"  (1.651 m)   Wt 203 lb 9.6 oz (92.4 kg)   LMP 04/01/2011   SpO2 97%   BMI 33.88 kg/m  General: Awake, appears stated age 16: MMM Heart: RRR, no murmurs Lungs: CTAB, no rales, wheezes or rhonchi. No accessory muscle use Abd: BS+, soft, NT, ND, no masses or organomegaly Psych: Age appropriate judgment and insight, normal affect and mood  Assessment and Plan: Exposure to hepatitis A - Plan: Hepatitis A antibody, IgM  Orders as above. Vaccination today and again in 6 months. Practice good hand hygiene. Continue to push fluids. Encouraged other close contacts to receive vaccination for hepatitis A. Alert [place] if she is starting to have symptoms. Follow-up with regular PCP as needed. The patient voiced understanding and agreement to the plan.  [place], DO 09/04/16  8:12 AM

## 2016-09-04 NOTE — Addendum Note (Signed)
Addended by: Harl Bowie on: 09/04/2016 05:30 PM   Modules accepted: Orders

## 2016-09-23 ENCOUNTER — Encounter: Payer: Self-pay | Admitting: Family Medicine

## 2016-09-23 ENCOUNTER — Ambulatory Visit (INDEPENDENT_AMBULATORY_CARE_PROVIDER_SITE_OTHER): Payer: 59 | Admitting: Family Medicine

## 2016-09-23 VITALS — BP 120/84 | HR 71 | Temp 98.2°F | Resp 18 | Wt 206.4 lb

## 2016-09-23 DIAGNOSIS — I10 Essential (primary) hypertension: Secondary | ICD-10-CM

## 2016-09-23 DIAGNOSIS — R739 Hyperglycemia, unspecified: Secondary | ICD-10-CM

## 2016-09-23 DIAGNOSIS — E782 Mixed hyperlipidemia: Secondary | ICD-10-CM

## 2016-09-23 DIAGNOSIS — E6609 Other obesity due to excess calories: Secondary | ICD-10-CM

## 2016-09-23 LAB — COMPREHENSIVE METABOLIC PANEL
ALT: 28 U/L (ref 0–35)
AST: 20 U/L (ref 0–37)
Albumin: 4.2 g/dL (ref 3.5–5.2)
Alkaline Phosphatase: 98 U/L (ref 39–117)
BUN: 22 mg/dL (ref 6–23)
CO2: 31 mEq/L (ref 19–32)
Calcium: 10.1 mg/dL (ref 8.4–10.5)
Chloride: 102 mEq/L (ref 96–112)
Creatinine, Ser: 0.7 mg/dL (ref 0.40–1.20)
GFR: 92.45 mL/min (ref >60.00)
Glucose, Bld: 124 mg/dL — ABNORMAL HIGH (ref 70–99)
Potassium: 4.9 mEq/L (ref 3.5–5.1)
Sodium: 140 mEq/L (ref 135–145)
Total Bilirubin: 0.4 mg/dL (ref 0.2–1.2)
Total Protein: 7.4 g/dL (ref 6.0–8.3)

## 2016-09-23 LAB — CBC
HCT: 42.6 % (ref 36.0–46.0)
Hemoglobin: 14.2 g/dL (ref 12.0–15.0)
MCHC: 33.4 g/dL (ref 30.0–36.0)
MCV: 89 fl (ref 78.0–100.0)
Platelets: 284 10*3/uL (ref 150.0–400.0)
RBC: 4.79 Mil/uL (ref 3.87–5.11)
RDW: 14.4 % (ref 11.5–15.5)
WBC: 7.6 10*3/uL (ref 4.0–10.5)

## 2016-09-23 LAB — LIPID PANEL
Cholesterol: 216 mg/dL — ABNORMAL HIGH (ref 0–200)
HDL: 66 mg/dL (ref >39.00)
LDL Cholesterol: 123 mg/dL — ABNORMAL HIGH (ref 0–99)
NonHDL: 150.13
Total CHOL/HDL Ratio: 3
Triglycerides: 138 mg/dL (ref 0.0–149.0)
VLDL: 27.6 mg/dL (ref 0.0–40.0)

## 2016-09-23 LAB — TSH: TSH: 3.25 u[IU]/mL (ref 0.35–4.50)

## 2016-09-23 LAB — HEMOGLOBIN A1C: Hgb A1c MFr Bld: 6.1 % (ref 4.6–6.5)

## 2016-09-23 NOTE — Assessment & Plan Note (Signed)
hgba1c acceptable, minimize simple carbs. Increase exercise as tolerated. Continue current meds 

## 2016-09-23 NOTE — Patient Instructions (Signed)

## 2016-09-23 NOTE — Assessment & Plan Note (Signed)
Check cmp 

## 2016-09-23 NOTE — Assessment & Plan Note (Signed)
Encouraged DASH diet, decrease po intake and increase exercise as tolerated. Needs 7-8 hours of sleep nightly. Avoid trans fats, eat small, frequent meals every 4-5 hours with lean proteins, complex carbs and healthy fats. Minimize simple carbs. Bariatric referral encouraged 

## 2016-09-23 NOTE — Assessment & Plan Note (Signed)
Tolerating statin, encouraged heart healthy diet, avoid trans fats, minimize simple carbs and saturated fats. Increase exercise as tolerated 

## 2016-09-23 NOTE — Progress Notes (Signed)
Subjective:  I acted as a Education administrator for Dr. Charlett Blake. Sandra [name], [place]  Patient ID: Sandra [name], female    DOB: 1960-12-19, 56 y.o.   MRN: [id number]  No chief complaint on file.   HPI  Patient is in today for 3 month follow up. Patient is following up on her HTN, hyperlipidemia and other medical concerns. Patient presents with no acute concerns. No recent febrile illness or acute hospitalizations. Denies CP/palp/SOB/HA/congestion/fevers/GI or GU c/o. Taking meds as prescribed  Her father passed away in 09-09-22 so she is sad. She feels she is managing well but she is sad and not exercising and eating well.   Patient Care Team: [name], MD as PCP - General (Family Medicine) Richmond Campbell, MD as Consulting Physician (Gastroenterology) Cheri Fowler, MD as Consulting Physician (Obstetrics and Gynecology)   Past Medical History:  Diagnosis Date  . Allergic rhinitis 05/31/2012  . Chest pain    PT SAW CARDIOLOGIST DR. Burt Knack - STRESS ECHO DONE NEGATIVE - NO PROBLEM SINCE  . Chicken pox as achild  . Cough 11/29/2012   RESOLVED - IT WAS CAUSED BY LISINOPRIL  . Elevated LFTs 05/31/2012  . GERD (gastroesophageal reflux disease)   . H/O tobacco use, presenting hazards to health 11/29/2012   Smoked 1 1/2 ppd for 20 years quit in roughtly 2007  . Headache 11/25/2015  . Hematuria 04/09/2014  . Hoarseness 02/04/2012   RESOLVED - THOUGHT TO HAVE BEEN ALLERY RELATED  . Hyperglycemia 04/06/2014  . Hyperlipidemia   . Hyperparathyroidism ([place])   . Hypertension   . Obesity, unspecified 05/31/2012  . Ocular migraine   . Overactive bladder 05/31/2012  . Preventative health care 02/04/2012  . Rapid heart beat    PT FEELS PROB RELATED TO HYPER PARATHYROIDISM  . SUI (stress urinary incontinence, female) 02/04/2012   Sees Dr Perlie Gold and they have discussed a bladder tack but so far she declines    Past Surgical History:  Procedure Laterality Date  . BREAST SURGERY     left breast excision of  adenoma  . btl    . PARATHYROIDECTOMY Right 08/12/2013   Procedure: right inferior frozen section PARATHYROIDECTOMY;  Surgeon: Earnstine Regal, MD;  Location: [facility];  Service: General;  Laterality: Right;  . PELVIC LAPAROSCOPY  03-2004  . TUBAL LIGATION    . WISDOM TOOTH EXTRACTION  30 yrs ago    Family History  Problem Relation Age of Onset  . Hypertension Mother 40       alive  . Renal cancer Mother        renal  . Hyperlipidemia Mother   . Osteoporosis Mother        humerus   . Macular degeneration Mother   . Tremor Mother   . Hypertension Father 81       alive  . Atrial fibrillation Father   . Colon cancer Father        colon  . Hyperlipidemia Father   . Stroke Father   . Hypertension Brother 6       alive  . Hypertension Sister   . Lung cancer Maternal Grandfather        lung/ smoker  . Emphysema Maternal Grandfather   . Heart disease Paternal Grandmother   . Other Paternal Grandfather        black lung  . Heart disease Paternal Grandfather   . Heart disease Paternal Aunt   . Heart disease Paternal Uncle     Social History  Social History  . Marital status: Married    Spouse name: N/A  . Number of children: 2  . Years of education: N/A   Occupational History  . RN   .  [employer]   Social History Main Topics  . Smoking status: Former Smoker    Packs/day: 1.00    Years: 20.00    Types: Cigarettes    Quit date: 03/10/2005  . Smokeless tobacco: Never Used  . Alcohol use Yes     Comment: very rarely  . Drug use: No  . Sexual activity: Yes    Partners: Male   Other Topics Concern  . Not on file   Social History Narrative  . No narrative on file    Outpatient Medications Prior to Visit  Medication Sig Dispense Refill  . aspirin EC 81 MG tablet Take 81 mg by mouth at bedtime.    Marland Kitchen atorvastatin (LIPITOR) 20 MG tablet Take 1 tablet (20 mg total) by mouth daily. 90 tablet 1  . Cholecalciferol (VITAMIN D PO) Take 2,000 mg by mouth daily.  Vitamin D    . Co-Enzyme Q-10 30 MG CAPS Take 100 mg by mouth at bedtime.    . DOCOSAHEXAENOIC ACID PO Take 1 tablet by mouth daily.    . furosemide (LASIX) 20 MG tablet TAKE 1 TABLET BY MOUTH  DAILY 90 tablet 1  . lactobacillus acidophilus (BACID) TABS tablet Take 2 tablets by mouth 3 (three) times daily.    Marland Kitchen losartan (COZAAR) 50 MG tablet TAKE 1 TABLET BY MOUTH  DAILY 90 tablet 1  . metoprolol succinate (TOPROL-XL) 25 MG 24 hr tablet Take 1 tablet (25 mg total) by mouth daily. 90 tablet 3  . omeprazole (PRILOSEC) 40 MG capsule TAKE 1 CAPSULE BY MOUTH  DAILY 90 capsule 1  . OVER THE COUNTER MEDICATION Take 1 capsule by mouth daily. Mega red- 1 capsule daily    . verapamil (CALAN-SR) 120 MG CR tablet TAKE 1 TABLET BY MOUTH AT  BEDTIME 90 tablet 1   No facility-administered medications prior to visit.     Allergies  Allergen Reactions  . Cefdinir Other (See Comments)    Severe Stomach cramps and diarrhea for several days.  . Doxycycline Diarrhea  . Codeine Nausea And Vomiting  . Penicillins     RASH, ITCHING  . Simvastatin     myalgias    Review of Systems  Constitutional: Positive for malaise/fatigue. Negative for fever.  HENT: Negative for congestion.   Eyes: Negative for blurred vision.  Respiratory: Negative for cough and shortness of breath.   Cardiovascular: Negative for chest pain, palpitations and leg swelling.  Gastrointestinal: Negative for vomiting.  Musculoskeletal: Negative for back pain.  Skin: Negative for rash.  Neurological: Negative for loss of consciousness and headaches.  Psychiatric/Behavioral: Positive for depression. The patient does not have insomnia.        Objective:    Physical Exam  Constitutional: She is oriented to person, place, and time. She appears well-developed and well-nourished. No distress.  HENT:  Head: Normocephalic and atraumatic.  Eyes: Conjunctivae are normal.  Neck: Normal range of motion. No thyromegaly present.    Cardiovascular: Normal rate and regular rhythm.   Pulmonary/Chest: Effort normal and breath sounds normal. She has no wheezes.  Abdominal: Soft. Bowel sounds are normal. There is no tenderness.  Musculoskeletal: Normal range of motion. She exhibits no edema or deformity.  Neurological: She is alert and oriented to person, place, and time.  Skin:  Skin is warm and dry. She is not diaphoretic.  Psychiatric: She has a normal mood and affect.    BP 120/84 (BP Location: Left Arm, Patient Position: Sitting, Cuff Size: Normal)   Pulse 71   Temp 98.2 F (36.8 C) (Oral)   Resp 18   Wt 206 lb 6.4 oz (93.6 kg)   LMP 04/01/2011   SpO2 96%   BMI 34.35 kg/m  Wt Readings from Last 3 Encounters:  09/23/16 206 lb 6.4 oz (93.6 kg)  09/04/16 203 lb 9.6 oz (92.4 kg)  06/24/16 205 lb 9.6 oz (93.3 kg)   BP Readings from Last 3 Encounters:  09/23/16 120/84  09/04/16 124/84  06/24/16 138/90     Immunization History  Administered Date(s) Administered  . Hepatitis A, Adult 09/04/2016  . Influenza Split 12/09/2011  . Influenza,inj,Quad PF,36+ Mos 11/29/2012, 11/01/2013, 11/20/2015  . Tdap 06/07/2015    Health Maintenance  Topic Date Due  . Hepatitis C Screening  06/24/2017 (Originally 1960-12-09)  . HIV Screening  06/24/2017 (Originally 01/20/1977)  . INFLUENZA VACCINE  10/08/2016  . PAP SMEAR  03/10/2017  . MAMMOGRAM  10/07/2017  . COLONOSCOPY  07/03/2019  . DEXA SCAN  07/28/2023  . TETANUS/TDAP  06/06/2025    Lab Results  Component Value Date   WBC 6.6 06/20/2016   HGB 13.6 06/20/2016   HCT 41.2 06/20/2016   PLT 278.0 06/20/2016   GLUCOSE 104 (H) 06/20/2016   CHOL 224 (H) 06/24/2016   TRIG 159.0 (H) 06/24/2016   HDL 55.60 06/24/2016   LDLDIRECT 201.0 12/14/2015   LDLCALC 136 (H) 06/24/2016   ALT 28 06/20/2016   AST 19 06/20/2016   NA 139 06/20/2016   K 4.7 06/20/2016   CL 100 06/20/2016   CREATININE 0.71 06/20/2016   BUN 20 06/20/2016   CO2 33 (H) 06/20/2016   TSH 5.34  (H) 06/20/2016   HGBA1C 6.3 06/20/2016    Lab Results  Component Value Date   TSH 5.34 (H) 06/20/2016   Lab Results  Component Value Date   WBC 6.6 06/20/2016   HGB 13.6 06/20/2016   HCT 41.2 06/20/2016   MCV 88.1 06/20/2016   PLT 278.0 06/20/2016   Lab Results  Component Value Date   NA 139 06/20/2016   K 4.7 06/20/2016   CO2 33 (H) 06/20/2016   GLUCOSE 104 (H) 06/20/2016   BUN 20 06/20/2016   CREATININE 0.71 06/20/2016   BILITOT 0.3 06/20/2016   ALKPHOS 98 06/20/2016   AST 19 06/20/2016   ALT 28 06/20/2016   PROT 7.4 06/20/2016   ALBUMIN 4.1 06/20/2016   CALCIUM 9.7 06/20/2016   GFR 91.04 06/20/2016   Lab Results  Component Value Date   CHOL 224 (H) 06/24/2016   Lab Results  Component Value Date   HDL 55.60 06/24/2016   Lab Results  Component Value Date   LDLCALC 136 (H) 06/24/2016   Lab Results  Component Value Date   TRIG 159.0 (H) 06/24/2016   Lab Results  Component Value Date   CHOLHDL 4 06/24/2016   Lab Results  Component Value Date   HGBA1C 6.3 06/20/2016         Assessment & Plan:   Problem List Items Addressed This Visit    Hypertension    Well controlled, no changes to meds. Encouraged heart healthy diet such as the DASH diet and exercise as tolerated.       Relevant Orders   CBC   Comprehensive metabolic panel   TSH  Hyperlipidemia    Tolerating statin, encouraged heart healthy diet, avoid trans fats, minimize simple carbs and saturated fats. Increase exercise as tolerated      Obesity    Encouraged DASH diet, decrease po intake and increase exercise as tolerated. Needs 7-8 hours of sleep nightly. Avoid trans fats, eat small, frequent meals every 4-5 hours with lean proteins, complex carbs and healthy fats. Minimize simple carbs. Bariatric referral encouraged      Hypercalcemia    Check cmp      Hyperglycemia    hgba1c acceptable, minimize simple carbs. Increase exercise as tolerated. Continue current meds       Relevant Orders   Hemoglobin A1c    Other Visit Diagnoses    Hyperlipidemia, mixed    -  Primary   Relevant Orders   Lipid panel      I am having Ms. Vigil maintain her Cholecalciferol (VITAMIN D PO), OVER THE COUNTER MEDICATION, aspirin EC, lactobacillus acidophilus, Co-Enzyme Q-10, DOCOSAHEXAENOIC ACID PO, metoprolol succinate, verapamil, losartan, furosemide, omeprazole, and atorvastatin.  No orders of the defined types were placed in this encounter.   CMA served as Education administrator during this visit. History, Physical and Plan performed by medical provider. Documentation and orders reviewed and attested to.  Penni Homans, MD

## 2016-09-23 NOTE — Assessment & Plan Note (Signed)
Well controlled, no changes to meds. Encouraged heart healthy diet such as the DASH diet and exercise as tolerated.  °

## 2016-10-29 ENCOUNTER — Telehealth: Payer: Self-pay | Admitting: Family Medicine

## 2016-10-29 NOTE — Telephone Encounter (Signed)
Relation to KT:GYBW Call back number:[phone]   Reason for call:  Patient would like most recent labs mailed to home, please advise

## 2016-10-30 NOTE — Telephone Encounter (Signed)
Last labs printed and mailed to patient.   PC

## 2016-11-03 ENCOUNTER — Other Ambulatory Visit: Payer: Self-pay | Admitting: Family Medicine

## 2016-11-19 ENCOUNTER — Other Ambulatory Visit: Payer: Self-pay | Admitting: Family Medicine

## 2016-12-09 ENCOUNTER — Other Ambulatory Visit: Payer: Self-pay | Admitting: Family Medicine

## 2016-12-19 ENCOUNTER — Other Ambulatory Visit: Payer: Self-pay

## 2016-12-19 MED ORDER — OMEPRAZOLE 40 MG PO CPDR: 40.0000 mg | DELAYED_RELEASE_CAPSULE | Freq: Every day | ORAL | 1 refills | Status: DC

## 2016-12-19 MED ORDER — LOSARTAN POTASSIUM 50 MG PO TABS: 50.0000 mg | ORAL_TABLET | Freq: Every day | ORAL | 1 refills | Status: DC

## 2016-12-23 ENCOUNTER — Other Ambulatory Visit: Payer: Self-pay

## 2016-12-23 ENCOUNTER — Other Ambulatory Visit: Payer: Self-pay | Admitting: Family Medicine

## 2016-12-25 ENCOUNTER — Ambulatory Visit: Payer: 59 | Admitting: Family Medicine

## 2017-01-14 LAB — HM MAMMOGRAPHY

## 2017-01-16 ENCOUNTER — Telehealth: Payer: Self-pay | Admitting: Family Medicine

## 2017-01-16 DIAGNOSIS — E782 Mixed hyperlipidemia: Secondary | ICD-10-CM

## 2017-01-16 DIAGNOSIS — I10 Essential (primary) hypertension: Secondary | ICD-10-CM

## 2017-01-16 DIAGNOSIS — R739 Hyperglycemia, unspecified: Secondary | ICD-10-CM

## 2017-01-16 NOTE — Telephone Encounter (Signed)
Orders placed.

## 2017-01-16 NOTE — Telephone Encounter (Signed)
Patient notified

## 2017-01-16 NOTE — Telephone Encounter (Signed)
Caller name: Relation to VC:BSWH Call back [place]:  Reason for call: pt came into the office for labs, however there was not a current order in the system, pt states she missed the current order, pt has appt on 01/20/17 at 5:45 would like know if Dr. Charlett Blake can put the order back in the system and she would like to come Monday morning to have the labs done. Please advise

## 2017-01-19 ENCOUNTER — Other Ambulatory Visit (INDEPENDENT_AMBULATORY_CARE_PROVIDER_SITE_OTHER): Payer: 59

## 2017-01-19 DIAGNOSIS — I10 Essential (primary) hypertension: Secondary | ICD-10-CM

## 2017-01-19 DIAGNOSIS — R739 Hyperglycemia, unspecified: Secondary | ICD-10-CM | POA: Diagnosis not present

## 2017-01-19 DIAGNOSIS — E782 Mixed hyperlipidemia: Secondary | ICD-10-CM

## 2017-01-19 LAB — LIPID PANEL
Cholesterol: 204 mg/dL — ABNORMAL HIGH (ref 0–200)
HDL: 47.4 mg/dL (ref >39.00)
LDL Cholesterol: 128 mg/dL — ABNORMAL HIGH (ref 0–99)
NonHDL: 156.6
Total CHOL/HDL Ratio: 4
Triglycerides: 144 mg/dL (ref 0.0–149.0)
VLDL: 28.8 mg/dL (ref 0.0–40.0)

## 2017-01-19 LAB — COMPREHENSIVE METABOLIC PANEL
ALT: 26 U/L (ref 0–35)
AST: 20 U/L (ref 0–37)
Albumin: 4 g/dL (ref 3.5–5.2)
Alkaline Phosphatase: 84 U/L (ref 39–117)
BUN: 14 mg/dL (ref 6–23)
CO2: 30 mEq/L (ref 19–32)
Calcium: 9.6 mg/dL (ref 8.4–10.5)
Chloride: 103 mEq/L (ref 96–112)
Creatinine, Ser: 0.6 mg/dL (ref 0.40–1.20)
GFR: 110.32 mL/min (ref >60.00)
Glucose, Bld: 121 mg/dL — ABNORMAL HIGH (ref 70–99)
Potassium: 4.3 mEq/L (ref 3.5–5.1)
Sodium: 141 mEq/L (ref 135–145)
Total Bilirubin: 0.4 mg/dL (ref 0.2–1.2)
Total Protein: 7 g/dL (ref 6.0–8.3)

## 2017-01-19 LAB — CBC
HCT: 40.8 % (ref 36.0–46.0)
Hemoglobin: 13.3 g/dL (ref 12.0–15.0)
MCHC: 32.6 g/dL (ref 30.0–36.0)
MCV: 90.4 fl (ref 78.0–100.0)
Platelets: 266 10*3/uL (ref 150.0–400.0)
RBC: 4.51 Mil/uL (ref 3.87–5.11)
RDW: 13.8 % (ref 11.5–15.5)
WBC: 5 10*3/uL (ref 4.0–10.5)

## 2017-01-19 LAB — HEMOGLOBIN A1C: Hgb A1c MFr Bld: 5.9 % (ref 4.6–6.5)

## 2017-01-19 LAB — TSH: TSH: 2.73 u[IU]/mL (ref 0.35–4.50)

## 2017-01-20 ENCOUNTER — Encounter: Payer: Self-pay | Admitting: Family Medicine

## 2017-01-20 ENCOUNTER — Ambulatory Visit (INDEPENDENT_AMBULATORY_CARE_PROVIDER_SITE_OTHER): Payer: 59 | Admitting: Family Medicine

## 2017-01-20 DIAGNOSIS — E782 Mixed hyperlipidemia: Secondary | ICD-10-CM

## 2017-01-20 DIAGNOSIS — E6609 Other obesity due to excess calories: Secondary | ICD-10-CM

## 2017-01-20 DIAGNOSIS — I10 Essential (primary) hypertension: Secondary | ICD-10-CM

## 2017-01-20 DIAGNOSIS — R739 Hyperglycemia, unspecified: Secondary | ICD-10-CM | POA: Diagnosis not present

## 2017-01-20 NOTE — Assessment & Plan Note (Signed)
Well controlled, no changes to meds. Encouraged heart healthy diet such as the DASH diet and exercise as tolerated.  °

## 2017-01-20 NOTE — Patient Instructions (Signed)

## 2017-01-20 NOTE — Assessment & Plan Note (Signed)
Encouraged heart healthy diet, increase exercise, avoid trans fats, consider a krill oil cap daily 

## 2017-01-20 NOTE — Progress Notes (Signed)
Subjective:  I acted as a Education administrator for Dr. Charlett Blake. [place], [place]  Patient ID: Sandra [name], female    DOB: 09-08-1960, 56 y.o.   MRN: [id number]  No chief complaint on file.   HPI  Patient is in today for a 3 month follow up and she feels mostly well. No recenr febrile illness or hospitalizaions. She is walking and trying to maintain a heart healthy diet. Denies CP/palp/SOB/HA/congestion/fevers/GI or GU c/o. Taking meds as prescribed. No polyuria or polydipsia.   Patient Care Team: [name], MD as PCP - General (Family Medicine) Richmond Campbell, MD as Consulting Physician (Gastroenterology) Cheri Fowler, MD as Consulting Physician (Obstetrics and Gynecology)   Past Medical History:  Diagnosis Date  . Allergic rhinitis 05/31/2012  . Chest pain    PT SAW CARDIOLOGIST DR. Burt Knack - STRESS ECHO DONE NEGATIVE - NO PROBLEM SINCE  . Chicken pox as achild  . Cough 11/29/2012   RESOLVED - IT WAS CAUSED BY LISINOPRIL  . Elevated LFTs 05/31/2012  . GERD (gastroesophageal reflux disease)   . H/O tobacco use, presenting hazards to health 11/29/2012   Smoked 1 1/2 ppd for 20 years quit in roughtly 2007  . Headache 11/25/2015  . Hematuria 04/09/2014  . Hoarseness 02/04/2012   RESOLVED - THOUGHT TO HAVE BEEN ALLERY RELATED  . Hyperglycemia 04/06/2014  . Hyperlipidemia   . Hyperparathyroidism ([place])   . Hypertension   . Obesity, unspecified 05/31/2012  . Ocular migraine   . Overactive bladder 05/31/2012  . Preventative health care 02/04/2012  . Rapid heart beat    PT FEELS PROB RELATED TO HYPER PARATHYROIDISM  . SUI (stress urinary incontinence, female) 02/04/2012   Sees Dr Perlie Gold and they have discussed a bladder tack but so far she declines    Past Surgical History:  Procedure Laterality Date  . BREAST SURGERY     left breast excision of adenoma  . btl    . PELVIC LAPAROSCOPY  03-2004  . right inferior frozen section PARATHYROIDECTOMY Right 08/12/2013   Performed by Armandina Gemma, MD at [facility]  . TUBAL LIGATION    . WISDOM TOOTH EXTRACTION  30 yrs ago    Family History  Problem Relation Age of Onset  . Hypertension Mother 67       alive  . Renal cancer Mother        renal  . Hyperlipidemia Mother   . Osteoporosis Mother        humerus   . Macular degeneration Mother   . Tremor Mother   . Hypertension Father 41       alive  . Atrial fibrillation Father   . Colon cancer Father        colon  . Hyperlipidemia Father   . Stroke Father   . Hypertension Brother 67       alive  . Hypertension Sister   . Lung cancer Maternal Grandfather        lung/ smoker  . Emphysema Maternal Grandfather   . Heart disease Paternal Grandmother   . Other Paternal Grandfather        black lung  . Heart disease Paternal Grandfather   . Heart disease Paternal Aunt   . Heart disease Paternal Uncle     Social History   Socioeconomic History  . Marital status: Married    Spouse name: Not on file  . Number of children: 2  . Years of education: Not on file  . Highest education  level: Not on file  Social Needs  . Financial resource strain: Not on file  . Food insecurity - worry: Not on file  . Food insecurity - inability: Not on file  . Transportation needs - medical: Not on file  . Transportation needs - non-medical: Not on file  Occupational History  . Occupation: Programmer, multimedia: [employer]  Tobacco Use  . Smoking status: Former Smoker    Packs/day: 1.00    Years: 20.00    Pack years: 20.00    Types: Cigarettes    Last attempt to quit: 03/10/2005    Years since quitting: 11.8  . Smokeless tobacco: Never Used  Substance and Sexual Activity  . Alcohol use: Yes    Comment: very rarely  . Drug use: No  . Sexual activity: Yes    Partners: Male  Other Topics Concern  . Not on file  Social History Narrative  . Not on file    Outpatient Medications Prior to Visit  Medication Sig Dispense Refill  . aspirin EC 81 MG tablet Take 81 mg by mouth at  bedtime.    Marland Kitchen atorvastatin (LIPITOR) 20 MG tablet TAKE 1 TABLET BY MOUTH  DAILY 90 tablet 0  . Cholecalciferol (VITAMIN D PO) Take 2,000 mg by mouth daily. Vitamin D    . Co-Enzyme Q-10 30 MG CAPS Take 100 mg by mouth at bedtime.    . DOCOSAHEXAENOIC ACID PO Take 1 tablet by mouth daily.    . furosemide (LASIX) 20 MG tablet Take 1 tablet (20 mg total) by mouth daily. 90 tablet 1  . lactobacillus acidophilus (BACID) TABS tablet Take 2 tablets by mouth 3 (three) times daily.    Marland Kitchen losartan (COZAAR) 50 MG tablet TAKE 1 TABLET BY MOUTH  DAILY 90 tablet 1  . metoprolol succinate (TOPROL-XL) 25 MG 24 hr tablet Take 1 tablet (25 mg total) by mouth daily. 90 tablet 3  . omeprazole (PRILOSEC) 40 MG capsule TAKE 1 CAPSULE BY MOUTH  DAILY 90 capsule 1  . OVER THE COUNTER MEDICATION Take 1 capsule by mouth daily. Mega red- 1 capsule daily    . verapamil (CALAN-SR) 120 MG CR tablet TAKE 1 TABLET BY MOUTH AT  BEDTIME 90 tablet 0   No facility-administered medications prior to visit.     Allergies  Allergen Reactions  . Cefdinir Other (See Comments)    Severe Stomach cramps and diarrhea for several days.  . Doxycycline Diarrhea  . Codeine Nausea And Vomiting  . Penicillins     RASH, ITCHING  . Simvastatin     myalgias    Review of Systems  Constitutional: Negative for fever and malaise/fatigue.  HENT: Negative for congestion.   Eyes: Negative for blurred vision.  Respiratory: Negative for shortness of breath.   Cardiovascular: Negative for chest pain, palpitations and leg swelling.  Gastrointestinal: Negative for abdominal pain, blood in stool and nausea.  Genitourinary: Negative for dysuria and frequency.  Musculoskeletal: Negative for falls.  Skin: Negative for rash.  Neurological: Negative for dizziness, loss of consciousness and headaches.  Endo/Heme/Allergies: Negative for environmental allergies.  Psychiatric/Behavioral: Negative for depression. The patient is not nervous/anxious.         Objective:    Physical Exam  Constitutional: She is oriented to person, place, and time. She appears well-developed and well-nourished. No distress.  HENT:  Head: Normocephalic and atraumatic.  Nose: Nose normal.  Eyes: Right eye exhibits no discharge. Left eye exhibits no discharge.  Neck:  Normal range of motion. Neck supple.  Cardiovascular: Normal rate and regular rhythm.  No murmur heard. Pulmonary/Chest: Effort normal and breath sounds normal.  Abdominal: Soft. Bowel sounds are normal. There is no tenderness.  Musculoskeletal: She exhibits no edema.  Neurological: She is alert and oriented to person, place, and time.  Skin: Skin is warm and dry.  Psychiatric: She has a normal mood and affect.  Nursing note and vitals reviewed.   BP (!) 141/84 (BP Location: Left Arm, Patient Position: Sitting, Cuff Size: Normal)   Pulse 97   Temp 98.6 F (37 C) (Oral)   Resp 18   Wt 212 lb (96.2 kg)   LMP 04/01/2011   SpO2 96%   BMI 35.28 kg/m  Wt Readings from Last 3 Encounters:  01/20/17 212 lb (96.2 kg)  09/23/16 206 lb 6.4 oz (93.6 kg)  09/04/16 203 lb 9.6 oz (92.4 kg)   BP Readings from Last 3 Encounters:  01/20/17 (!) 141/84  09/23/16 120/84  09/04/16 124/84     Immunization History  Administered Date(s) Administered  . Hepatitis A, Adult 09/04/2016  . Influenza Split 12/09/2011  . Influenza,inj,Quad PF,6+ Mos 11/29/2012, 11/01/2013, 11/20/2015  . Tdap 06/07/2015    Health Maintenance  Topic Date Due  . INFLUENZA VACCINE  10/08/2016  . Hepatitis C Screening  06/24/2017 (Originally 12/10/1960)  . HIV Screening  06/24/2017 (Originally 01/20/1977)  . PAP SMEAR  03/10/2017  . MAMMOGRAM  10/07/2017  . COLONOSCOPY  07/03/2019  . DEXA SCAN  07/28/2023  . TETANUS/TDAP  06/06/2025    Lab Results  Component Value Date   WBC 5.0 01/19/2017   HGB 13.3 01/19/2017   HCT 40.8 01/19/2017   PLT 266.0 01/19/2017   GLUCOSE 121 (H) 01/19/2017   CHOL 204 (H)  01/19/2017   TRIG 144.0 01/19/2017   HDL 47.40 01/19/2017   LDLDIRECT 201.0 12/14/2015   LDLCALC 128 (H) 01/19/2017   ALT 26 01/19/2017   AST 20 01/19/2017   NA 141 01/19/2017   K 4.3 01/19/2017   CL 103 01/19/2017   CREATININE 0.60 01/19/2017   BUN 14 01/19/2017   CO2 30 01/19/2017   TSH 2.73 01/19/2017   HGBA1C 5.9 01/19/2017    Lab Results  Component Value Date   TSH 2.73 01/19/2017   Lab Results  Component Value Date   WBC 5.0 01/19/2017   HGB 13.3 01/19/2017   HCT 40.8 01/19/2017   MCV 90.4 01/19/2017   PLT 266.0 01/19/2017   Lab Results  Component Value Date   NA 141 01/19/2017   K 4.3 01/19/2017   CO2 30 01/19/2017   GLUCOSE 121 (H) 01/19/2017   BUN 14 01/19/2017   CREATININE 0.60 01/19/2017   BILITOT 0.4 01/19/2017   ALKPHOS 84 01/19/2017   AST 20 01/19/2017   ALT 26 01/19/2017   PROT 7.0 01/19/2017   ALBUMIN 4.0 01/19/2017   CALCIUM 9.6 01/19/2017   GFR 110.32 01/19/2017   Lab Results  Component Value Date   CHOL 204 (H) 01/19/2017   Lab Results  Component Value Date   HDL 47.40 01/19/2017   Lab Results  Component Value Date   LDLCALC 128 (H) 01/19/2017   Lab Results  Component Value Date   TRIG 144.0 01/19/2017   Lab Results  Component Value Date   CHOLHDL 4 01/19/2017   Lab Results  Component Value Date   HGBA1C 5.9 01/19/2017         Assessment & Plan:   Problem List Items  Addressed This Visit    Hypertension    Well controlled, no changes to meds. Encouraged heart healthy diet such as the DASH diet and exercise as tolerated.       Relevant Orders   CBC   Comprehensive metabolic panel   TSH   Hyperlipidemia    Encouraged heart healthy diet, increase exercise, avoid trans fats, consider a krill oil cap daily      Relevant Orders   Lipid panel   Obesity    Encouraged DASH diet, decrease po intake and increase exercise as tolerated. Needs 7-8 hours of sleep nightly. Avoid trans fats, eat small, frequent meals every  4-5 hours with lean proteins, complex carbs and healthy fats. Minimize simple carbs      Hyperglycemia    Well controlled, no changes to meds. Encouraged heart healthy diet such as the DASH diet and exercise as tolerated.       Relevant Orders   Hemoglobin A1c      I am having Sandra [name] maintain her Cholecalciferol (VITAMIN D PO), OVER THE COUNTER MEDICATION, aspirin EC, lactobacillus acidophilus, Co-Enzyme Q-10, DOCOSAHEXAENOIC ACID PO, metoprolol succinate, furosemide, atorvastatin, verapamil, omeprazole, and losartan.  No orders of the defined types were placed in this encounter.   CMA served as Education administrator during this visit. History, Physical and Plan performed by medical provider. Documentation and orders reviewed and attested to.  Penni Homans, MD

## 2017-01-25 NOTE — Assessment & Plan Note (Signed)
Encouraged DASH diet, decrease po intake and increase exercise as tolerated. Needs 7-8 hours of sleep nightly. Avoid trans fats, eat small, frequent meals every 4-5 hours with lean proteins, complex carbs and healthy fats. Minimize simple carbs 

## 2017-02-08 ENCOUNTER — Other Ambulatory Visit: Payer: Self-pay | Admitting: Family Medicine

## 2017-03-11 ENCOUNTER — Ambulatory Visit: Payer: 59

## 2017-03-12 ENCOUNTER — Ambulatory Visit (INDEPENDENT_AMBULATORY_CARE_PROVIDER_SITE_OTHER): Payer: 59 | Admitting: Family Medicine

## 2017-03-12 ENCOUNTER — Encounter: Payer: Self-pay | Admitting: Family Medicine

## 2017-03-12 VITALS — BP 127/80 | HR 76 | Temp 98.6°F | Resp 16 | Ht 66.0 in | Wt 212.2 lb

## 2017-03-12 DIAGNOSIS — R05 Cough: Secondary | ICD-10-CM | POA: Diagnosis not present

## 2017-03-12 DIAGNOSIS — R059 Cough, unspecified: Secondary | ICD-10-CM

## 2017-03-12 DIAGNOSIS — R0981 Nasal congestion: Secondary | ICD-10-CM

## 2017-03-12 DIAGNOSIS — J40 Bronchitis, not specified as acute or chronic: Secondary | ICD-10-CM | POA: Diagnosis not present

## 2017-03-12 DIAGNOSIS — Z23 Encounter for immunization: Secondary | ICD-10-CM

## 2017-03-12 MED ORDER — AZITHROMYCIN 250 MG PO TABS: ORAL_TABLET | ORAL | 0 refills | Status: DC

## 2017-03-12 NOTE — Progress Notes (Signed)
[facility] [address] [place], [place] [address] [phone]  Date:  03/12/2017   Name:  Sandra [name]   DOB:  1960/09/30   MRN:  [id number]  PCP:  [name], MD    Chief Complaint: No chief complaint on file.   History of Present Illness:  Sandra [name] is a 57 y.o. very pleasant female patient who presents with the following:  Today is Thursday- she was diagnosed with flu at [facility] on Sunday.  Flu a positive She was started on tamiflu- she has 1 pill left to take She continues to have aches and chills She did have diarrhea but this is now resolved No vomiting Poor appetite  Her ears did hurt, this got better She is coughing up and blowing out mucus  She is taking advil- thinks that she may be continuing to run fevers but she is not sure   Her husband and brother had the flu as wel She is treated for HTN and hyperlipidemia  She is able to tolerate a zpack in the past She is due for her 2nd hep A shot but we decided to defer today as she is acutely ill  Patient Active Problem List   Diagnosis Date Noted  . Headache 11/25/2015  . Acute bacterial sinusitis 03/26/2015  . Hematuria 04/09/2014  . Hyperglycemia 04/06/2014  . Sinusitis, acute maxillary 01/31/2014  . Diarrhea 09/21/2013  . Hyperparathyroidism, primary ([place]) 07/12/2013  . Dyspnea 12/28/2012  . Hypercalcemia 12/16/2012  . H/O tobacco use, presenting hazards to health 11/29/2012  . Overactive bladder 05/31/2012  . Elevated LFTs 05/31/2012  . Obesity 05/31/2012  . Allergic rhinitis 05/31/2012  . Hoarseness 02/04/2012  . Preventative health care 02/04/2012  . SUI (stress urinary incontinence, female) 02/04/2012  . Ocular migraine   . GERD (gastroesophageal reflux disease)   . Rotator cuff syndrome of right shoulder 06/11/2011  . Atypical chest pain 01/03/2011  . Hypertension 01/03/2011  . Hyperlipidemia 01/03/2011  . KNEE PAIN,  BILATERAL 09/28/2008  . ANSERINE BURSITIS 09/28/2008  . 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 Allergic rhinitis 05/31/2012  . Chest pain    PT SAW CARDIOLOGIST DR. Burt Knack - STRESS ECHO DONE NEGATIVE - NO PROBLEM SINCE  . Chicken pox as achild  . Cough 11/29/2012   RESOLVED - IT WAS CAUSED BY LISINOPRIL  . Elevated LFTs 05/31/2012  . GERD (gastroesophageal reflux disease)   . H/O tobacco use, presenting hazards to health 11/29/2012   Smoked 1 1/2 ppd for 20 years quit in roughtly 2007  . Headache 11/25/2015  . Hematuria 04/09/2014  . Hoarseness 02/04/2012   RESOLVED - THOUGHT TO HAVE BEEN ALLERY RELATED  . Hyperglycemia 04/06/2014  . Hyperlipidemia   . Hyperparathyroidism ([place])   . Hypertension   . Obesity, unspecified 05/31/2012  . Ocular migraine   . Overactive bladder 05/31/2012  . Preventative health care 02/04/2012  . Rapid heart beat    PT FEELS PROB RELATED TO HYPER PARATHYROIDISM  . SUI (stress urinary incontinence, female) 02/04/2012   Sees Dr Perlie Gold and they have discussed a bladder tack but so far she declines    Past Surgical History:  Procedure Laterality Date  . BREAST SURGERY     left breast excision of adenoma  . btl    . PARATHYROIDECTOMY Right 08/12/2013   Procedure: right inferior frozen section PARATHYROIDECTOMY;  Surgeon: Earnstine Regal, MD;  Location: [facility];  Service: General;  Laterality: Right;  . PELVIC LAPAROSCOPY  03-2004  . TUBAL LIGATION    . WISDOM TOOTH EXTRACTION  30 yrs ago    Social History   Tobacco Use  . Smoking status: Former Smoker    Packs/day: 1.00    Years: 20.00    Pack years: 20.00    Types: Cigarettes    Last attempt to quit: 03/10/2005    Years since quitting: 12.0  . Smokeless tobacco: Never Used  Substance Use Topics  . Alcohol use: Yes    Comment: very rarely  . Drug use: No    Family History  Problem Relation Age of Onset  . Hypertension Mother 54       alive  .  Renal cancer Mother        renal  . Hyperlipidemia Mother   . Osteoporosis Mother        humerus   . Macular degeneration Mother   . Tremor Mother   . Hypertension Father 43       alive  . Atrial fibrillation Father   . Colon cancer Father        colon  . Hyperlipidemia Father   . Stroke Father   . Hypertension Brother 44       alive  . Hypertension Sister   . Lung cancer Maternal Grandfather        lung/ smoker  . Emphysema Maternal Grandfather   . Heart disease Paternal Grandmother   . Other Paternal Grandfather        black lung  . Heart disease Paternal Grandfather   . Heart disease Paternal Aunt   . Heart disease Paternal Uncle     Allergies  Allergen Reactions  . Cefdinir Other (See Comments)    Severe Stomach cramps and diarrhea for several days.  . Doxycycline Diarrhea  . Codeine Nausea And Vomiting  . Penicillins     RASH, ITCHING  . Simvastatin     myalgias    Medication list has been reviewed and updated.  Current Outpatient Medications on File Prior to Visit  Medication Sig Dispense Refill  . aspirin EC 81 MG tablet Take 81 mg by mouth at bedtime.    Marland Kitchen atorvastatin (LIPITOR) 20 MG tablet TAKE 1 TABLET BY MOUTH  DAILY 90 tablet 0  . Cholecalciferol (VITAMIN D PO) Take 2,000 mg by mouth daily. Vitamin D    . Co-Enzyme Q-10 30 MG CAPS Take 100 mg by mouth at bedtime.    . DOCOSAHEXAENOIC ACID PO Take 1 tablet by mouth daily.    . furosemide (LASIX) 20 MG tablet Take 1 tablet (20 mg total) by mouth daily. 90 tablet 1  . lactobacillus acidophilus (BACID) TABS tablet Take 2 tablets by mouth 3 (three) times daily.    Marland Kitchen losartan (COZAAR) 50 MG tablet TAKE 1 TABLET BY MOUTH  DAILY 90 tablet 1  . metoprolol succinate (TOPROL-XL) 25 MG 24 hr tablet Take 1 tablet (25 mg total) by mouth daily. 90 tablet 3  . omeprazole (PRILOSEC) 40 MG capsule TAKE 1 CAPSULE BY MOUTH  DAILY 90 capsule 1  . OVER THE COUNTER MEDICATION Take 1 capsule by mouth daily. Mega red- 1  capsule daily    . verapamil (CALAN-SR) 120 MG CR tablet TAKE 1 TABLET BY MOUTH AT  BEDTIME 90 tablet 0   No current facility-administered medications on file prior to visit.  Review of Systems:  As per HPI- otherwise negative.   Physical Examination: Vitals:   03/12/17 1748  BP: 127/80  Pulse: 76  Resp: 16  Temp: 98.6 F (37 C)  SpO2: 96%   Vitals:   03/12/17 1748  Weight: 212 lb 3.2 oz (96.3 kg)  Height: 5\' 6"  (1.676 m)   Body mass index is 34.25 kg/m. Ideal Body Weight: Weight in (lb) to have BMI = 25: 154.6  GEN: WDWN, NAD, Non-toxic, A & O x 3 HEENT: Atraumatic, Normocephalic. Neck supple. No masses, No LAD.  Bilateral TM wnl, oropharynx normal.  PEERL,EOMI.   Ears and Nose: No external deformity. CV: RRR, No M/G/R. No JVD. No thrill. No extra heart sounds. PULM: CTA B, no wheezes, crackles, rhonchi. No retractions. No resp. distress. No accessory muscle use. EXTR: No c/c/e NEURO Normal gait.  PSYCH: Normally interactive. Conversant. Not depressed or anxious appearing.  Calm demeanor.  Obese, looks ok otherwise   Assessment and Plan: Bronchitis - Plan: azithromycin (ZITHROMAX) 250 MG tablet  Cough - Plan: azithromycin (ZITHROMAX) 250 MG tablet  Sinus congestion - Plan: azithromycin (ZITHROMAX) 250 MG tablet  Immunization due - Plan: Hepatitis A vaccine adult IM  Here today with persistent sx after treatment for flu with influenza. She would like an abx which is a reasonable idea Will rx zpack for her Ordered 2nd hep A for her to have done as a nurse visit only once she is well   Signed Lamar Blinks, MD

## 2017-03-12 NOTE — Patient Instructions (Signed)
We are going to add azithromycin to your regimen to cover bronchitis Please let us know if you are not feeling better in the next couple of days- Sooner if worse.   Please come in for a nurse visit and get your 2nd hep A shot at your convenience

## 2017-03-23 ENCOUNTER — Other Ambulatory Visit: Payer: Self-pay | Admitting: Family Medicine

## 2017-03-24 ENCOUNTER — Encounter: Payer: 59 | Admitting: Family Medicine

## 2017-03-27 ENCOUNTER — Ambulatory Visit (INDEPENDENT_AMBULATORY_CARE_PROVIDER_SITE_OTHER): Payer: 59

## 2017-03-27 DIAGNOSIS — Z23 Encounter for immunization: Secondary | ICD-10-CM

## 2017-04-04 ENCOUNTER — Other Ambulatory Visit: Payer: Self-pay | Admitting: Family Medicine

## 2017-04-13 ENCOUNTER — Other Ambulatory Visit: Payer: Self-pay | Admitting: Family Medicine

## 2017-06-15 ENCOUNTER — Other Ambulatory Visit (INDEPENDENT_AMBULATORY_CARE_PROVIDER_SITE_OTHER): Payer: 59

## 2017-06-15 DIAGNOSIS — R739 Hyperglycemia, unspecified: Secondary | ICD-10-CM | POA: Diagnosis not present

## 2017-06-15 DIAGNOSIS — E782 Mixed hyperlipidemia: Secondary | ICD-10-CM

## 2017-06-15 DIAGNOSIS — I10 Essential (primary) hypertension: Secondary | ICD-10-CM

## 2017-06-15 LAB — LIPID PANEL
Cholesterol: 175 mg/dL (ref 0–200)
HDL: 42.8 mg/dL (ref >39.00)
LDL Cholesterol: 110 mg/dL — ABNORMAL HIGH (ref 0–99)
NonHDL: 132
Total CHOL/HDL Ratio: 4
Triglycerides: 111 mg/dL (ref 0.0–149.0)
VLDL: 22.2 mg/dL (ref 0.0–40.0)

## 2017-06-15 LAB — COMPREHENSIVE METABOLIC PANEL
ALT: 27 U/L (ref 0–35)
AST: 32 U/L (ref 0–37)
Albumin: 4.3 g/dL (ref 3.5–5.2)
Alkaline Phosphatase: 79 U/L (ref 39–117)
BUN: 15 mg/dL (ref 6–23)
CO2: 29 mEq/L (ref 19–32)
Calcium: 9.6 mg/dL (ref 8.4–10.5)
Chloride: 101 mEq/L (ref 96–112)
Creatinine, Ser: 0.68 mg/dL (ref 0.40–1.20)
GFR: 95.34 mL/min (ref >60.00)
Glucose, Bld: 90 mg/dL (ref 70–99)
Potassium: 4.1 mEq/L (ref 3.5–5.1)
Sodium: 139 mEq/L (ref 135–145)
Total Bilirubin: 0.5 mg/dL (ref 0.2–1.2)
Total Protein: 7.4 g/dL (ref 6.0–8.3)

## 2017-06-15 LAB — CBC
HCT: 40.6 % (ref 36.0–46.0)
Hemoglobin: 13.5 g/dL (ref 12.0–15.0)
MCHC: 33.4 g/dL (ref 30.0–36.0)
MCV: 88 fl (ref 78.0–100.0)
Platelets: 222 10*3/uL (ref 150.0–400.0)
RBC: 4.61 Mil/uL (ref 3.87–5.11)
RDW: 14.2 % (ref 11.5–15.5)
WBC: 5 10*3/uL (ref 4.0–10.5)

## 2017-06-15 LAB — TSH: TSH: 3.04 u[IU]/mL (ref 0.35–4.50)

## 2017-06-15 LAB — HEMOGLOBIN A1C: Hgb A1c MFr Bld: 5.9 % (ref 4.6–6.5)

## 2017-06-16 ENCOUNTER — Other Ambulatory Visit: Payer: Self-pay | Admitting: Family Medicine

## 2017-07-27 ENCOUNTER — Ambulatory Visit (HOSPITAL_BASED_OUTPATIENT_CLINIC_OR_DEPARTMENT_OTHER)
Admission: RE | Admit: 2017-07-27 | Discharge: 2017-07-27 | Disposition: A | Discharge status: Home / Self Care | Payer: 59 | Source: Ambulatory Visit | Attending: Family Medicine | Admitting: Family Medicine

## 2017-07-27 ENCOUNTER — Ambulatory Visit (INDEPENDENT_AMBULATORY_CARE_PROVIDER_SITE_OTHER): Payer: 59 | Admitting: Family Medicine

## 2017-07-27 ENCOUNTER — Encounter: Payer: Self-pay | Admitting: Family Medicine

## 2017-07-27 VITALS — BP 110/76 | HR 77 | Temp 98.2°F | Resp 18 | Ht 66.0 in | Wt 191.2 lb

## 2017-07-27 DIAGNOSIS — H6981 Other specified disorders of Eustachian tube, right ear: Secondary | ICD-10-CM

## 2017-07-27 DIAGNOSIS — E6609 Other obesity due to excess calories: Secondary | ICD-10-CM | POA: Diagnosis not present

## 2017-07-27 DIAGNOSIS — E782 Mixed hyperlipidemia: Secondary | ICD-10-CM

## 2017-07-27 DIAGNOSIS — M79672 Pain in left foot: Secondary | ICD-10-CM | POA: Diagnosis not present

## 2017-07-27 DIAGNOSIS — M7732 Calcaneal spur, left foot: Secondary | ICD-10-CM | POA: Insufficient documentation

## 2017-07-27 DIAGNOSIS — I10 Essential (primary) hypertension: Secondary | ICD-10-CM

## 2017-07-27 DIAGNOSIS — H6980 Other specified disorders of Eustachian tube, unspecified ear: Secondary | ICD-10-CM

## 2017-07-27 DIAGNOSIS — Z0001 Encounter for general adult medical examination with abnormal findings: Secondary | ICD-10-CM

## 2017-07-27 DIAGNOSIS — H6990 Unspecified Eustachian tube disorder, unspecified ear: Secondary | ICD-10-CM

## 2017-07-27 DIAGNOSIS — R739 Hyperglycemia, unspecified: Secondary | ICD-10-CM

## 2017-07-27 DIAGNOSIS — Z0000 Encounter for general adult medical examination without abnormal findings: Secondary | ICD-10-CM

## 2017-07-27 HISTORY — DX: Other specified disorders of Eustachian tube, unspecified ear: H69.80

## 2017-07-27 HISTORY — DX: Unspecified eustachian tube disorder, unspecified ear: H69.90

## 2017-07-27 NOTE — Assessment & Plan Note (Signed)
wnl

## 2017-07-27 NOTE — Progress Notes (Signed)
Subjective:  I acted as a Education administrator for Dr. Charlett Blake. [place], [place]  Patient ID: Sandra [name], female    DOB: [birth year], 57 y.o.   MRN: [id number]  No chief complaint on file.   HPI  Patient is in today for a annual preventative exam and follow up on chronic medical concerns including hyperglycemia, hyperlipidemia, reflux, obesity and n=more. She has done a good job of eating less and cleaner and moving more and has lost over 20 pounds. No recent febrile illness or acute hospitalizations. No polyuria or polydipsia. Denies CP/palp/SOB/HA/congestion/fevers/GI or GU c/o. Taking meds as prescribed. Does well with ADLs and is struggling with the stress of managing her mother's endstage parkinson's disease. They have put her in a facility recently but they are stressed about that.    Patient Care Team: [name], MD as PCP - General (Family Medicine) Richmond Campbell, MD as Consulting Physician (Gastroenterology) Cheri Fowler, MD as Consulting Physician (Obstetrics and Gynecology)   Past Medical History:  Diagnosis Date  . Allergic rhinitis 05/31/2012  . Chest pain    PT SAW CARDIOLOGIST DR. Burt Knack - STRESS ECHO DONE NEGATIVE - NO PROBLEM SINCE  . Chicken pox as achild  . Cough 11/29/2012   RESOLVED - IT WAS CAUSED BY LISINOPRIL  . Elevated LFTs 05/31/2012  . Eustachian tube dysfunction 07/27/2017  . GERD (gastroesophageal reflux disease)   . H/O tobacco use, presenting hazards to health 11/29/2012   Smoked 1 1/2 ppd for 20 years quit in roughtly 2007  . Headache 11/25/2015  . Hematuria 04/09/2014  . Hoarseness 02/04/2012   RESOLVED - THOUGHT TO HAVE BEEN ALLERY RELATED  . Hyperglycemia 04/06/2014  . Hyperlipidemia   . Hyperparathyroidism ([place])   . Hypertension   . Obesity, unspecified 05/31/2012  . Ocular migraine   . Overactive bladder 05/31/2012  . Preventative health care 02/04/2012  . Rapid heart beat    PT FEELS PROB RELATED TO HYPER PARATHYROIDISM  . SUI (stress urinary  incontinence, female) 02/04/2012   Sees Dr Perlie Gold and they have discussed a bladder tack but so far she declines    Past Surgical History:  Procedure Laterality Date  . BREAST SURGERY     left breast excision of adenoma  . btl    . PARATHYROIDECTOMY Right 08/12/2013   Procedure: right inferior frozen section PARATHYROIDECTOMY;  Surgeon: Earnstine Regal, MD;  Location: [facility];  Service: General;  Laterality: Right;  . PELVIC LAPAROSCOPY  03-2004  . TUBAL LIGATION    . WISDOM TOOTH EXTRACTION  30 yrs ago    Family History  Problem Relation Age of Onset  . Hypertension Mother 58       alive  . Renal cancer Mother        renal  . Hyperlipidemia Mother   . Osteoporosis Mother        humerus   . Macular degeneration Mother   . Tremor Mother   . Hypertension Father 72       alive  . Atrial fibrillation Father   . Colon cancer Father        colon  . Hyperlipidemia Father   . Stroke Father   . Hypertension Brother 93       alive  . Hypertension Sister   . Lung cancer Maternal Grandfather        lung/ smoker  . Emphysema Maternal Grandfather   . Heart disease Paternal Grandmother   . Other Paternal Grandfather  black lung  . Heart disease Paternal Grandfather   . Heart disease Paternal Aunt   . Heart disease Paternal Uncle     Social History   Socioeconomic History  . Marital status: Married    Spouse name: Not on file  . Number of children: 2  . Years of education: Not on file  . Highest education level: Not on file  Occupational History  . Occupation: Programmer, multimedia: [employer]  . Financial resource strain: Not on file  . Food insecurity:    Worry: Not on file    Inability: Not on file  . Transportation needs:    Medical: Not on file    Non-medical: Not on file  Tobacco Use  . Smoking status: Former Smoker    Packs/day: 1.00    Years: 20.00    Pack years: 20.00    Types: Cigarettes    Last attempt to quit: 03/10/2005    Years  since quitting: 12.3  . Smokeless tobacco: Never Used  Substance and Sexual Activity  . Alcohol use: Yes    Comment: very rarely  . Drug use: No  . Sexual activity: Yes    Partners: Male  Lifestyle  . Physical activity:    Days per week: Not on file    Minutes per session: Not on file  . Stress: Not on file  Relationships  . Social connections:    Talks on phone: Not on file    Gets together: Not on file    Attends religious service: Not on file    Active member of club or organization: Not on file    Attends meetings of clubs or organizations: Not on file    Relationship status: Not on file  . Intimate partner violence:    Fear of current or ex partner: Not on file    Emotionally abused: Not on file    Physically abused: Not on file    Forced sexual activity: Not on file  Other Topics Concern  . Not on file  Social History Narrative  . Not on file    Outpatient Medications Prior to Visit  Medication Sig Dispense Refill  . aspirin EC 81 MG tablet Take 81 mg by mouth at bedtime.    Marland Kitchen atorvastatin (LIPITOR) 20 MG tablet TAKE 1 TABLET BY MOUTH  DAILY 90 tablet 0  . Cholecalciferol (VITAMIN D PO) Take 2,000 mg by mouth daily. Vitamin D    . Co-Enzyme Q-10 30 MG CAPS Take 100 mg by mouth at bedtime.    . DOCOSAHEXAENOIC ACID PO Take 1 tablet by mouth daily.    . furosemide (LASIX) 20 MG tablet TAKE 1 TABLET BY MOUTH  DAILY 90 tablet 1  . lactobacillus acidophilus (BACID) TABS tablet Take 2 tablets by mouth 3 (three) times daily.    Marland Kitchen losartan (COZAAR) 50 MG tablet TAKE 1 TABLET BY MOUTH  DAILY 90 tablet 1  . metoprolol succinate (TOPROL-XL) 25 MG 24 hr tablet TAKE 1 TABLET BY MOUTH  DAILY 90 tablet 3  . omeprazole (PRILOSEC) 40 MG capsule TAKE 1 CAPSULE BY MOUTH  DAILY 90 capsule 1  . OVER THE COUNTER MEDICATION Take 1 capsule by mouth daily. Mega red- 1 capsule daily    . verapamil (CALAN-SR) 120 MG CR tablet TAKE 1 TABLET BY MOUTH AT  BEDTIME 90 tablet 0  . azithromycin  (ZITHROMAX) 250 MG tablet Use as a zpack 6 tablet 0   No  facility-administered medications prior to visit.     Allergies  Allergen Reactions  . Cefdinir Other (See Comments)    Severe Stomach cramps and diarrhea for several days.  . Doxycycline Diarrhea  . Codeine Nausea And Vomiting  . Penicillins     RASH, ITCHING  . Simvastatin     myalgias    Review of Systems  Constitutional: Negative for chills, fever and malaise/fatigue.  HENT: Positive for ear pain. Negative for congestion and hearing loss.   Eyes: Negative for discharge.  Respiratory: Negative for cough, sputum production and shortness of breath.   Cardiovascular: Negative for chest pain, palpitations and leg swelling.  Gastrointestinal: Negative for abdominal pain, blood in stool, constipation, diarrhea, heartburn, nausea and vomiting.  Genitourinary: Negative for dysuria, frequency, hematuria and urgency.  Musculoskeletal: Negative for back pain, falls and myalgias.  Skin: Negative for rash.  Neurological: Negative for dizziness, sensory change, loss of consciousness, weakness and headaches.  Endo/Heme/Allergies: Negative for environmental allergies. Does not bruise/bleed easily.  Psychiatric/Behavioral: Negative for depression and suicidal ideas. The patient is not nervous/anxious and does not have insomnia.        Objective:    Physical Exam  Constitutional: No distress.  HENT:  Left Ear: External ear normal.  Mouth/Throat: No oropharyngeal exudate.  Eyes: EOM are normal. Left eye exhibits no discharge. No scleral icterus.  Neck: No JVD present. No tracheal deviation present.  Cardiovascular: Normal heart sounds and intact distal pulses.  Pulmonary/Chest: No respiratory distress. She has no rales.  Abdominal: She exhibits no distension and no mass. There is no tenderness. There is no guarding.  Musculoskeletal: She exhibits no edema or tenderness.  Lymphadenopathy:    She has no cervical adenopathy.    Neurological: She displays normal reflexes.  Skin: No rash noted. No erythema.    BP 110/76 (BP Location: Left Arm, Patient Position: Sitting, Cuff Size: Normal)   Pulse 77   Temp 98.2 F (36.8 C) (Oral)   Resp 18   Ht 5\' 6"  (1.676 m)   Wt 191 lb 3.2 oz (86.7 kg)   LMP 04/01/2011   SpO2 95%   BMI 30.86 kg/m  Wt Readings from Last 3 Encounters:  07/27/17 191 lb 3.2 oz (86.7 kg)  03/12/17 212 lb 3.2 oz (96.3 kg)  01/20/17 212 lb (96.2 kg)   BP Readings from Last 3 Encounters:  07/27/17 110/76  03/12/17 127/80  01/20/17 (!) 141/84     Immunization History  Administered Date(s) Administered  . Hepatitis A, Adult 09/04/2016, 03/27/2017  . Influenza Split 12/09/2011  . Influenza,inj,Quad PF,6+ Mos 11/29/2012, 11/01/2013, 11/20/2015  . Tdap 06/07/2015    Health Maintenance  Topic Date Due  . Hepatitis C Screening  05/21/1960  . HIV Screening  01/20/1977  . PAP SMEAR  03/10/2017  . MAMMOGRAM  10/07/2017  . INFLUENZA VACCINE  10/08/2017  . COLONOSCOPY  07/03/2019  . DEXA SCAN  07/28/2023  . TETANUS/TDAP  06/06/2025    Lab Results  Component Value Date   WBC 5.0 06/15/2017   HGB 13.5 06/15/2017   HCT 40.6 06/15/2017   PLT 222.0 06/15/2017   GLUCOSE 90 06/15/2017   CHOL 175 06/15/2017   TRIG 111.0 06/15/2017   HDL 42.80 06/15/2017   LDLDIRECT 201.0 12/14/2015   LDLCALC 110 (H) 06/15/2017   ALT 27 06/15/2017   AST 32 06/15/2017   NA 139 06/15/2017   K 4.1 06/15/2017   CL 101 06/15/2017   CREATININE 0.68 06/15/2017   BUN 15  06/15/2017   CO2 29 06/15/2017   TSH 3.04 06/15/2017   HGBA1C 5.9 06/15/2017    Lab Results  Component Value Date   TSH 3.04 06/15/2017   Lab Results  Component Value Date   WBC 5.0 06/15/2017   HGB 13.5 06/15/2017   HCT 40.6 06/15/2017   MCV 88.0 06/15/2017   PLT 222.0 06/15/2017   Lab Results  Component Value Date   NA 139 06/15/2017   K 4.1 06/15/2017   CO2 29 06/15/2017   GLUCOSE 90 06/15/2017   BUN 15 06/15/2017    CREATININE 0.68 06/15/2017   BILITOT 0.5 06/15/2017   ALKPHOS 79 06/15/2017   AST 32 06/15/2017   ALT 27 06/15/2017   PROT 7.4 06/15/2017   ALBUMIN 4.3 06/15/2017   CALCIUM 9.6 06/15/2017   GFR 95.34 06/15/2017   Lab Results  Component Value Date   CHOL 175 06/15/2017   Lab Results  Component Value Date   HDL 42.80 06/15/2017   Lab Results  Component Value Date   LDLCALC 110 (H) 06/15/2017   Lab Results  Component Value Date   TRIG 111.0 06/15/2017   Lab Results  Component Value Date   CHOLHDL 4 06/15/2017   Lab Results  Component Value Date   HGBA1C 5.9 06/15/2017         Assessment & Plan:   Problem List Items Addressed This Visit    Hypertension    Well controlled, no changes to meds. Encouraged heart healthy diet such as the DASH diet and exercise as tolerated.       Relevant Orders   CBC   Comprehensive metabolic panel   TSH   Hyperlipidemia    Encouraged heart healthy diet, increase exercise, avoid trans fats, consider a krill oil cap daily. She feels her pain in her foot is related to Atorvastatin so she cut it in 1/2 for a month and felt better. Will try a month off and reassess pain.      Relevant Orders   Lipid panel   Preventative health care    Patient encouraged to maintain heart healthy diet, regular exercise, adequate sleep. Consider daily probiotics. Take medications as prescribed. Labs ordered today      Obesity    Encouraged DASH diet, decrease po intake and increase exercise as tolerated. Needs 7-8 hours of sleep nightly. Avoid trans fats, eat small, frequent meals every 4-5 hours with lean proteins, complex carbs and healthy fats. Minimize simple carbs.      Hypercalcemia    wnl      Hyperglycemia    hgba1c acceptable, minimize simple carbs. Increase exercise as tolerated.       Relevant Orders   Hemoglobin A1c   Eustachian tube dysfunction    Right ear pain, encouraged nasal saline and valsalva maneuver. flonase if worsens  then will refer to ENT for further consideration       Other Visit Diagnoses    Left foot pain    -  Primary   Relevant Orders   DG Foot Complete Left      I have discontinued Sandra [name]'s azithromycin. I am also having her maintain her Cholecalciferol (VITAMIN D PO), OVER THE COUNTER MEDICATION, aspirin EC, lactobacillus acidophilus, Co-Enzyme Q-10, DOCOSAHEXAENOIC ACID PO, omeprazole, losartan, furosemide, atorvastatin, metoprolol succinate, and verapamil.  No orders of the defined types were placed in this encounter.   CMA served as Education administrator during this visit. History, Physical and Plan performed by medical provider. Documentation and orders  reviewed and attested to.  Penni Homans, MD

## 2017-07-27 NOTE — Assessment & Plan Note (Addendum)
Encouraged heart healthy diet, increase exercise, avoid trans fats, consider a krill oil cap daily. She feels her pain in her foot is related to Atorvastatin so she cut it in 1/2 for a month and felt better. Will try a month off and reassess pain.

## 2017-07-27 NOTE — Assessment & Plan Note (Signed)
hgba1c acceptable, minimize simple carbs. Increase exercise as tolerated.  

## 2017-07-27 NOTE — Assessment & Plan Note (Signed)
Patient encouraged to maintain heart healthy diet, regular exercise, adequate sleep. Consider daily probiotics. Take medications as prescribed. Labs ordered today

## 2017-07-27 NOTE — Assessment & Plan Note (Signed)
Well controlled, no changes to meds. Encouraged heart healthy diet such as the DASH diet and exercise as tolerated.  °

## 2017-07-27 NOTE — Assessment & Plan Note (Signed)
Encouraged DASH diet, decrease po intake and increase exercise as tolerated. Needs 7-8 hours of sleep nightly. Avoid trans fats, eat small, frequent meals every 4-5 hours with lean proteins, complex carbs and healthy fats. Minimize simple carbs 

## 2017-07-27 NOTE — Patient Instructions (Signed)
Preventive Care 40-64 Years, Female Preventive care refers to lifestyle choices and visits with your health care provider that can promote health and wellness. What does preventive care include?  A yearly physical exam. This is also called an annual well check.  Dental exams once or twice a year.  Routine eye exams. Ask your health care provider how often you should have your eyes checked.  Personal lifestyle choices, including: ? Daily care of your teeth and gums. ? Regular physical activity. ? Eating a healthy diet. ? Avoiding tobacco and drug use. ? Limiting alcohol use. ? Practicing safe sex. ? Taking low-dose aspirin daily starting at age 58. ? Taking vitamin and mineral supplements as recommended by your health care provider. What happens during an annual well check? The services and screenings done by your health care provider during your annual well check will depend on your age, overall health, lifestyle risk factors, and family history of disease. Counseling Your health care provider may ask you questions about your:  Alcohol use.  Tobacco use.  Drug use.  Emotional well-being.  Home and relationship well-being.  Sexual activity.  Eating habits.  Work and work Statistician.  Method of birth control.  Menstrual cycle.  Pregnancy history.  Screening You may have the following tests or measurements:  Height, weight, and BMI.  Blood pressure.  Lipid and cholesterol levels. These may be checked every 5 years, or more frequently if you are over 81 years old.  Skin check.  Lung cancer screening. You may have this screening every year starting at age 78 if you have a 30-pack-year history of smoking and currently smoke or have quit within the past 15 years.  Fecal occult blood test (FOBT) of the stool. You may have this test every year starting at age 65.  Flexible sigmoidoscopy or colonoscopy. You may have a sigmoidoscopy every 5 years or a colonoscopy  every 10 years starting at age 30.  Hepatitis C blood test.  Hepatitis B blood test.  Sexually transmitted disease (STD) testing.  Diabetes screening. This is done by checking your blood sugar (glucose) after you have not eaten for a while (fasting). You may have this done every 1-3 years.  Mammogram. This may be done every 1-2 years. Talk to your health care provider about when you should start having regular mammograms. This may depend on whether you have a family history of breast cancer.  BRCA-related cancer screening. This may be done if you have a family history of breast, ovarian, tubal, or peritoneal cancers.  Pelvic exam and Pap test. This may be done every 3 years starting at age 80. Starting at age 36, this may be done every 5 years if you have a Pap test in combination with an HPV test.  Bone density scan. This is done to screen for osteoporosis. You may have this scan if you are at high risk for osteoporosis.  Discuss your test results, treatment options, and if necessary, the need for more tests with your health care provider. Vaccines Your health care provider may recommend certain vaccines, such as:  Influenza vaccine. This is recommended every year.  Tetanus, diphtheria, and acellular pertussis (Tdap, Td) vaccine. You may need a Td booster every 10 years.  Varicella vaccine. You may need this if you have not been vaccinated.  Zoster vaccine. You may need this after age 5.  Measles, mumps, and rubella (MMR) vaccine. You may need at least one dose of MMR if you were born in  1957 or later. You may also need a second dose.  Pneumococcal 13-valent conjugate (PCV13) vaccine. You may need this if you have certain conditions and were not previously vaccinated.  Pneumococcal polysaccharide (PPSV23) vaccine. You may need one or two doses if you smoke cigarettes or if you have certain conditions.  Meningococcal vaccine. You may need this if you have certain  conditions.  Hepatitis A vaccine. You may need this if you have certain conditions or if you travel or work in places where you may be exposed to hepatitis A.  Hepatitis B vaccine. You may need this if you have certain conditions or if you travel or work in places where you may be exposed to hepatitis B.  Haemophilus influenzae type b (Hib) vaccine. You may need this if you have certain conditions.  Talk to your health care provider about which screenings and vaccines you need and how often you need them. This information is not intended to replace advice given to you by your health care provider. Make sure you discuss any questions you have with your health care provider. Document Released: 03/23/2015 Document Revised: 11/14/2015 Document Reviewed: 12/26/2014 Elsevier Interactive Patient Education  2018 Elsevier Inc.  

## 2017-07-27 NOTE — Assessment & Plan Note (Signed)
Right ear pain, encouraged nasal saline and valsalva maneuver. flonase if worsens then will refer to ENT for further consideration

## 2017-08-05 ENCOUNTER — Encounter: Payer: Self-pay | Admitting: Family Medicine

## 2017-08-09 ENCOUNTER — Other Ambulatory Visit: Payer: Self-pay | Admitting: Family Medicine

## 2017-10-07 ENCOUNTER — Other Ambulatory Visit: Payer: Self-pay | Admitting: Family Medicine

## 2017-11-27 ENCOUNTER — Other Ambulatory Visit: Payer: 59

## 2017-12-01 ENCOUNTER — Ambulatory Visit: Payer: 59 | Admitting: Family Medicine

## 2017-12-03 ENCOUNTER — Telehealth: Payer: Self-pay | Admitting: Family Medicine

## 2017-12-03 MED ORDER — LOSARTAN POTASSIUM 50 MG PO TABS: 50.0000 mg | ORAL_TABLET | Freq: Every day | ORAL | 1 refills | Status: DC

## 2017-12-03 NOTE — Telephone Encounter (Signed)
Losartan 50 MG tab refill request LOV 07/27/17 Last ordered on 12/25/16 #90 tabs with 1 refill PCP Dr. Charlett Blake

## 2017-12-03 NOTE — Telephone Encounter (Signed)
Copied from [place] [phone] [id number]. Topic: Quick Communication - Rx Refill/Question >> Dec 03, 2017  4:20 PM [place], [place] A wrote: Medication: losartan (COZAAR) 50 MG tablet [[id number]]  Has the patient contacted their pharmacy? No  (Agent: If no, request that the patient contact the pharmacy for the refill.) (Agent: If yes, when and what did the pharmacy advise?)  [facility]   Agent: Please be advised that RX refills may take up to 3 business days. We ask that you follow-up with your pharmacy.

## 2017-12-04 ENCOUNTER — Other Ambulatory Visit (INDEPENDENT_AMBULATORY_CARE_PROVIDER_SITE_OTHER): Payer: PRIVATE HEALTH INSURANCE

## 2017-12-04 DIAGNOSIS — I10 Essential (primary) hypertension: Secondary | ICD-10-CM

## 2017-12-04 DIAGNOSIS — E782 Mixed hyperlipidemia: Secondary | ICD-10-CM

## 2017-12-04 DIAGNOSIS — R739 Hyperglycemia, unspecified: Secondary | ICD-10-CM | POA: Diagnosis not present

## 2017-12-04 LAB — LIPID PANEL
Cholesterol: 188 mg/dL (ref 0–200)
HDL: 45.5 mg/dL (ref >39.00)
LDL Cholesterol: 113 mg/dL — ABNORMAL HIGH (ref 0–99)
NonHDL: 142.72
Total CHOL/HDL Ratio: 4
Triglycerides: 150 mg/dL — ABNORMAL HIGH (ref 0.0–149.0)
VLDL: 30 mg/dL (ref 0.0–40.0)

## 2017-12-04 LAB — COMPREHENSIVE METABOLIC PANEL
ALT: 22 U/L (ref 0–35)
AST: 17 U/L (ref 0–37)
Albumin: 3.9 g/dL (ref 3.5–5.2)
Alkaline Phosphatase: 84 U/L (ref 39–117)
BUN: 16 mg/dL (ref 6–23)
CO2: 28 mEq/L (ref 19–32)
Calcium: 9.2 mg/dL (ref 8.4–10.5)
Chloride: 105 mEq/L (ref 96–112)
Creatinine, Ser: 0.65 mg/dL (ref 0.40–1.20)
GFR: 100.26 mL/min (ref >60.00)
Glucose, Bld: 112 mg/dL — ABNORMAL HIGH (ref 70–99)
Potassium: 4.4 mEq/L (ref 3.5–5.1)
Sodium: 140 mEq/L (ref 135–145)
Total Bilirubin: 0.4 mg/dL (ref 0.2–1.2)
Total Protein: 6.4 g/dL (ref 6.0–8.3)

## 2017-12-04 LAB — HEMOGLOBIN A1C: Hgb A1c MFr Bld: 6.3 % (ref 4.6–6.5)

## 2017-12-04 LAB — CBC
HCT: 39 % (ref 36.0–46.0)
Hemoglobin: 13 g/dL (ref 12.0–15.0)
MCHC: 33.3 g/dL (ref 30.0–36.0)
MCV: 87.2 fl (ref 78.0–100.0)
Platelets: 251 10*3/uL (ref 150.0–400.0)
RBC: 4.47 Mil/uL (ref 3.87–5.11)
RDW: 14.7 % (ref 11.5–15.5)
WBC: 5.9 10*3/uL (ref 4.0–10.5)

## 2017-12-04 LAB — TSH: TSH: 2.63 u[IU]/mL (ref 0.35–4.50)

## 2017-12-05 ENCOUNTER — Other Ambulatory Visit: Payer: Self-pay | Admitting: Family Medicine

## 2017-12-28 ENCOUNTER — Other Ambulatory Visit: Payer: Self-pay | Admitting: Family Medicine

## 2017-12-29 ENCOUNTER — Ambulatory Visit (INDEPENDENT_AMBULATORY_CARE_PROVIDER_SITE_OTHER): Payer: PRIVATE HEALTH INSURANCE | Admitting: Family Medicine

## 2017-12-29 VITALS — BP 118/80 | HR 84 | Temp 98.2°F | Resp 18 | Ht 66.0 in | Wt 199.4 lb

## 2017-12-29 DIAGNOSIS — K219 Gastro-esophageal reflux disease without esophagitis: Secondary | ICD-10-CM

## 2017-12-29 DIAGNOSIS — R739 Hyperglycemia, unspecified: Secondary | ICD-10-CM

## 2017-12-29 DIAGNOSIS — G43109 Migraine with aura, not intractable, without status migrainosus: Secondary | ICD-10-CM | POA: Diagnosis not present

## 2017-12-29 DIAGNOSIS — E210 Primary hyperparathyroidism: Secondary | ICD-10-CM

## 2017-12-29 DIAGNOSIS — I10 Essential (primary) hypertension: Secondary | ICD-10-CM

## 2017-12-29 DIAGNOSIS — E782 Mixed hyperlipidemia: Secondary | ICD-10-CM | POA: Diagnosis not present

## 2017-12-29 DIAGNOSIS — Z23 Encounter for immunization: Secondary | ICD-10-CM

## 2017-12-29 NOTE — Patient Instructions (Addendum)
Shingrix is the new shingles shot 2 shots over 2 to 6 months check with insurance regarding coverage and then can call for nurse appt to get shot  Carbohydrate Counting for Diabetes Mellitus, Adult Carbohydrate counting is a method for keeping track of how many carbohydrates you eat. Eating carbohydrates naturally increases the amount of sugar (glucose) in the blood. Counting how many carbohydrates you eat helps keep your blood glucose within normal limits, which helps you manage your diabetes (diabetes mellitus). It is important to know how many carbohydrates you can safely have in each meal. This is different for every person. A diet and nutrition specialist (registered dietitian) can help you make a meal plan and calculate how many carbohydrates you should have at each meal and snack. Carbohydrates are found in the following foods:  Grains, such as breads and cereals.  Dried beans and soy products.  Starchy vegetables, such as potatoes, peas, and corn.  Fruit and fruit juices.  Milk and yogurt.  Sweets and snack foods, such as cake, cookies, candy, chips, and soft drinks.  How do I count carbohydrates? There are two ways to count carbohydrates in food. You can use either of the methods or a combination of both. Reading "Nutrition Facts" on packaged food The "Nutrition Facts" list is included on the labels of almost all packaged foods and beverages in the [place] It includes:  The serving size.  Information about nutrients in each serving, including the grams (g) of carbohydrate per serving.  To use the "Nutrition Facts":  Decide how many servings you will have.  Multiply the number of servings by the number of carbohydrates per serving.  The resulting number is the total amount of carbohydrates that you will be having.  Learning standard serving sizes of other foods When you eat foods containing carbohydrates that are not packaged or do not include "Nutrition Facts" on the  label, you need to measure the servings in order to count the amount of carbohydrates:  Measure the foods that you will eat with a food scale or measuring cup, if needed.  Decide how many standard-size servings you will eat.  Multiply the number of servings by 15. Most carbohydrate-rich foods have about 15 g of carbohydrates per serving. ? For example, if you eat 8 oz (170 g) of strawberries, you will have eaten 2 servings and 30 g of carbohydrates (2 servings x 15 g = 30 g).  For foods that have more than one food mixed, such as soups and casseroles, you must count the carbohydrates in each food that is included.  The following list contains standard serving sizes of common carbohydrate-rich foods. Each of these servings has about 15 g of carbohydrates:   hamburger bun or  English muffin.   oz (15 mL) syrup.   oz (14 g) jelly.  1 slice of bread.  1 six-inch tortilla.  3 oz (85 g) cooked rice or pasta.  4 oz (113 g) cooked dried beans.  4 oz (113 g) starchy vegetable, such as peas, corn, or potatoes.  4 oz (113 g) hot cereal.  4 oz (113 g) mashed potatoes or  of a large baked potato.  4 oz (113 g) canned or frozen fruit.  4 oz (120 mL) fruit juice.  4-6 crackers.  6 chicken nuggets.  6 oz (170 g) unsweetened dry cereal.  6 oz (170 g) plain fat-free yogurt or yogurt sweetened with artificial sweeteners.  8 oz (240 mL) milk.  8 oz (170 g)  fresh fruit or one small piece of fruit.  24 oz (680 g) popped popcorn.  Example of carbohydrate counting Sample meal  3 oz (85 g) chicken breast.  6 oz (170 g) brown rice.  4 oz (113 g) corn.  8 oz (240 mL) milk.  8 oz (170 g) strawberries with sugar-free whipped topping. Carbohydrate calculation 1. Identify the foods that contain carbohydrates: ? Rice. ? Corn. ? Milk. ? Strawberries. 2. Calculate how many servings you have of each food: ? 2 servings rice. ? 1 serving corn. ? 1 serving milk. ? 1 serving  strawberries. 3. Multiply each number of servings by 15 g: ? 2 servings rice x 15 g = 30 g. ? 1 serving corn x 15 g = 15 g. ? 1 serving milk x 15 g = 15 g. ? 1 serving strawberries x 15 g = 15 g. 4. Add together all of the amounts to find the total grams of carbohydrates eaten: ? 30 g + 15 g + 15 g + 15 g = 75 g of carbohydrates total. This information is not intended to replace advice given to you by your health care provider. Make sure you discuss any questions you have with your health care provider. Document Released: 02/24/2005 Document Revised: 09/14/2015 Document Reviewed: 08/08/2015 Elsevier Interactive Patient Education  [employer].

## 2018-01-03 NOTE — Progress Notes (Signed)
Subjective:    Patient ID: Sandra [name], female    DOB: 05-Jan-1961, 57 y.o.   MRN: [id number]  No chief complaint on file.   HPI Patient is in today for follow-up.  Overall she is doing well.  No recent febrile illness or hospitalizations.  She denies polyuria or polydipsia.  Is trying to maintain a heart healthy diet and minimize her carbohydrate intake.  She is managing her activities of daily living well. Denies CP/palp/SOB/HA/congestion/fevers/GI or GU c/o. Taking meds as prescribed  Past Medical History:  Diagnosis Date  . Allergic rhinitis 05/31/2012  . Chest pain    PT SAW CARDIOLOGIST DR. Burt Knack - STRESS ECHO DONE NEGATIVE - NO PROBLEM SINCE  . Chicken pox as achild  . Cough 11/29/2012   RESOLVED - IT WAS CAUSED BY LISINOPRIL  . Elevated LFTs 05/31/2012  . Eustachian tube dysfunction 07/27/2017  . GERD (gastroesophageal reflux disease)   . H/O tobacco use, presenting hazards to health 11/29/2012   Smoked 1 1/2 ppd for 20 years quit in roughtly 2007  . Headache 11/25/2015  . Hematuria 04/09/2014  . Hoarseness 02/04/2012   RESOLVED - THOUGHT TO HAVE BEEN ALLERY RELATED  . Hyperglycemia 04/06/2014  . Hyperlipidemia   . Hyperparathyroidism ([place])   . Hypertension   . Obesity, unspecified 05/31/2012  . Ocular migraine   . Overactive bladder 05/31/2012  . Preventative health care 02/04/2012  . Rapid heart beat    PT FEELS PROB RELATED TO HYPER PARATHYROIDISM  . SUI (stress urinary incontinence, female) 02/04/2012   Sees Dr Perlie Gold and they have discussed a bladder tack but so far she declines    Past Surgical History:  Procedure Laterality Date  . BREAST SURGERY     left breast excision of adenoma  . btl    . PARATHYROIDECTOMY Right 08/12/2013   Procedure: right inferior frozen section PARATHYROIDECTOMY;  Surgeon: Earnstine Regal, MD;  Location: [facility];  Service: General;  Laterality: Right;  . PELVIC LAPAROSCOPY  03-2004  . TUBAL LIGATION    . WISDOM TOOTH EXTRACTION   30 yrs ago    Family History  Problem Relation Age of Onset  . Hypertension Mother 30       alive  . Renal cancer Mother        renal  . Hyperlipidemia Mother   . Osteoporosis Mother        humerus   . Macular degeneration Mother   . Tremor Mother   . Hypertension Father 59       alive  . Atrial fibrillation Father   . Colon cancer Father        colon  . Hyperlipidemia Father   . Stroke Father   . Hypertension Brother 58       alive  . Hypertension Sister   . Lung cancer Maternal Grandfather        lung/ smoker  . Emphysema Maternal Grandfather   . Heart disease Paternal Grandmother   . Other Paternal Grandfather        black lung  . Heart disease Paternal Grandfather   . Heart disease Paternal Aunt   . Heart disease Paternal Uncle     Social History   Socioeconomic History  . Marital status: Married    Spouse name: Not on file  . Number of children: 2  . Years of education: Not on file  . Highest education level: Not on file  Occupational History  . Occupation: Therapist, sports  Employer: [employer]  Social Needs  . Financial resource strain: Not on file  . Food insecurity:    Worry: Not on file    Inability: Not on file  . Transportation needs:    Medical: Not on file    Non-medical: Not on file  Tobacco Use  . Smoking status: Former Smoker    Packs/day: 1.00    Years: 20.00    Pack years: 20.00    Types: Cigarettes    Last attempt to quit: 03/10/2005    Years since quitting: 12.8  . Smokeless tobacco: Never Used  Substance and Sexual Activity  . Alcohol use: Yes    Comment: very rarely  . Drug use: No  . Sexual activity: Yes    Partners: Male  Lifestyle  . Physical activity:    Days per week: Not on file    Minutes per session: Not on file  . Stress: Not on file  Relationships  . Social connections:    Talks on phone: Not on file    Gets together: Not on file    Attends religious service: Not on file    Active member of club or organization:  Not on file    Attends meetings of clubs or organizations: Not on file    Relationship status: Not on file  . Intimate partner violence:    Fear of current or ex partner: Not on file    Emotionally abused: Not on file    Physically abused: Not on file    Forced sexual activity: Not on file  Other Topics Concern  . Not on file  Social History Narrative  . Not on file    Outpatient Medications Prior to Visit  Medication Sig Dispense Refill  . aspirin EC 81 MG tablet Take 325 mg by mouth at bedtime.     Marland Kitchen atorvastatin (LIPITOR) 20 MG tablet TAKE 1 TABLET BY MOUTH  DAILY 90 tablet 0  . Cholecalciferol (VITAMIN D PO) Take 2,000 mg by mouth daily. Vitamin D    . Co-Enzyme Q-10 30 MG CAPS Take 100 mg by mouth at bedtime.    . DOCOSAHEXAENOIC ACID PO Take 1 tablet by mouth daily.    . furosemide (LASIX) 20 MG tablet TAKE 1 TABLET BY MOUTH  DAILY 90 tablet 1  . lactobacillus acidophilus (BACID) TABS tablet Take 2 tablets by mouth 3 (three) times daily.    Marland Kitchen losartan (COZAAR) 50 MG tablet Take 1 tablet (50 mg total) by mouth daily. 90 tablet 1  . metoprolol succinate (TOPROL-XL) 25 MG 24 hr tablet TAKE 1 TABLET BY MOUTH  DAILY 90 tablet 3  . omeprazole (PRILOSEC) 40 MG capsule TAKE 1 CAPSULE BY MOUTH  DAILY 90 capsule 1  . OVER THE COUNTER MEDICATION Take 1 capsule by mouth daily. Mega red- 1 capsule daily    . verapamil (CALAN-SR) 120 MG CR tablet TAKE 1 TABLET BY MOUTH AT  BEDTIME 90 tablet 0   No facility-administered medications prior to visit.     Allergies  Allergen Reactions  . Cefdinir Other (See Comments)    Severe Stomach cramps and diarrhea for several days.  . Doxycycline Diarrhea  . Codeine Nausea And Vomiting  . Penicillins     RASH, ITCHING  . Simvastatin     myalgias    Review of Systems  Constitutional: Negative for fever and malaise/fatigue.  HENT: Negative for congestion.   Eyes: Negative for blurred vision.  Respiratory: Negative for shortness of breath.  Cardiovascular: Negative for chest pain, palpitations and leg swelling.  Gastrointestinal: Negative for abdominal pain, blood in stool and nausea.  Genitourinary: Negative for dysuria and frequency.  Musculoskeletal: Negative for falls.  Skin: Negative for rash.  Neurological: Negative for dizziness, loss of consciousness and headaches.  Endo/Heme/Allergies: Negative for environmental allergies.  Psychiatric/Behavioral: Negative for depression. The patient is not nervous/anxious.        Objective:    Physical Exam  Constitutional: She is oriented to person, place, and time. She appears well-developed and well-nourished. No distress.  HENT:  Head: Normocephalic and atraumatic.  Nose: Nose normal.  Eyes: Right eye exhibits no discharge. Left eye exhibits no discharge.  Neck: Normal range of motion. Neck supple.  Cardiovascular: Normal rate and regular rhythm.  No murmur heard. Pulmonary/Chest: Effort normal and breath sounds normal.  Abdominal: Soft. Bowel sounds are normal. There is no tenderness.  Musculoskeletal: She exhibits no edema.  Neurological: She is alert and oriented to person, place, and time.  Skin: Skin is warm and dry.  Psychiatric: She has a normal mood and affect.  Nursing note and vitals reviewed.   BP 118/80 (BP Location: Left Arm, Patient Position: Sitting, Cuff Size: Normal)   Pulse 84   Temp 98.2 F (36.8 C) (Oral)   Resp 18   Ht 5\' 6"  (1.676 m)   Wt 199 lb 6.4 oz (90.4 kg)   LMP 04/01/2011   SpO2 95%   BMI 32.18 kg/m  Wt Readings from Last 3 Encounters:  12/29/17 199 lb 6.4 oz (90.4 kg)  07/27/17 191 lb 3.2 oz (86.7 kg)  03/12/17 212 lb 3.2 oz (96.3 kg)     Lab Results  Component Value Date   WBC 5.9 12/04/2017   HGB 13.0 12/04/2017   HCT 39.0 12/04/2017   PLT 251.0 12/04/2017   GLUCOSE 112 (H) 12/04/2017   CHOL 188 12/04/2017   TRIG 150.0 (H) 12/04/2017   HDL 45.50 12/04/2017   LDLDIRECT 201.0 12/14/2015   LDLCALC 113 (H) 12/04/2017    ALT 22 12/04/2017   AST 17 12/04/2017   NA 140 12/04/2017   K 4.4 12/04/2017   CL 105 12/04/2017   CREATININE 0.65 12/04/2017   BUN 16 12/04/2017   CO2 28 12/04/2017   TSH 2.63 12/04/2017   HGBA1C 6.3 12/04/2017    Lab Results  Component Value Date   TSH 2.63 12/04/2017   Lab Results  Component Value Date   WBC 5.9 12/04/2017   HGB 13.0 12/04/2017   HCT 39.0 12/04/2017   MCV 87.2 12/04/2017   PLT 251.0 12/04/2017   Lab Results  Component Value Date   NA 140 12/04/2017   K 4.4 12/04/2017   CO2 28 12/04/2017   GLUCOSE 112 (H) 12/04/2017   BUN 16 12/04/2017   CREATININE 0.65 12/04/2017   BILITOT 0.4 12/04/2017   ALKPHOS 84 12/04/2017   AST 17 12/04/2017   ALT 22 12/04/2017   PROT 6.4 12/04/2017   ALBUMIN 3.9 12/04/2017   CALCIUM 9.2 12/04/2017   GFR 100.26 12/04/2017   Lab Results  Component Value Date   CHOL 188 12/04/2017   Lab Results  Component Value Date   HDL 45.50 12/04/2017   Lab Results  Component Value Date   LDLCALC 113 (H) 12/04/2017   Lab Results  Component Value Date   TRIG 150.0 (H) 12/04/2017   Lab Results  Component Value Date   CHOLHDL 4 12/04/2017   Lab Results  Component Value Date  HGBA1C 6.3 12/04/2017       Assessment & Plan:   Problem List Items Addressed This Visit    Hypertension - Primary    Well controlled, no changes to meds. Encouraged heart healthy diet such as the DASH diet and exercise as tolerated.       Relevant Orders   CBC   Comprehensive metabolic panel   TSH   Hyperlipidemia    Tolerating statin, encouraged heart healthy diet, avoid trans fats, minimize simple carbs and saturated fats. Increase exercise as tolerated      Relevant Orders   Lipid panel   Ocular migraine   Relevant Orders   Comprehensive metabolic panel   GERD (gastroesophageal reflux disease)    Avoid offending foods, start probiotics. Do not eat large meals in late evening and consider raising head of bed.        Hypercalcemia    Resolved       Hyperparathyroidism, primary ([place])   Relevant Orders   VITAMIN D 25 Hydroxy (Vit-D Deficiency, Fractures)   PTH, Intact and Calcium   Hyperglycemia    hgba1c acceptable, minimize simple carbs. Increase exercise as tolerated.       Relevant Orders   Hemoglobin A1c      I am having Sandra [name] maintain her Cholecalciferol (VITAMIN D PO), OVER THE COUNTER MEDICATION, aspirin EC, lactobacillus acidophilus, Co-Enzyme Q-10, DOCOSAHEXAENOIC ACID PO, furosemide, metoprolol succinate, omeprazole, losartan, verapamil, and atorvastatin.  No orders of the defined types were placed in this encounter.    Penni Homans, MD

## 2018-01-03 NOTE — Assessment & Plan Note (Signed)
Tolerating statin, encouraged heart healthy diet, avoid trans fats, minimize simple carbs and saturated fats. Increase exercise as tolerated 

## 2018-01-03 NOTE — Assessment & Plan Note (Signed)
Well controlled, no changes to meds. Encouraged heart healthy diet such as the DASH diet and exercise as tolerated.  °

## 2018-01-03 NOTE — Assessment & Plan Note (Signed)
Resolved

## 2018-01-03 NOTE — Assessment & Plan Note (Signed)
hgba1c acceptable, minimize simple carbs. Increase exercise as tolerated.  

## 2018-01-03 NOTE — Assessment & Plan Note (Signed)
Avoid offending foods, start probiotics. Do not eat large meals in late evening and consider raising head of bed.  

## 2018-01-22 ENCOUNTER — Other Ambulatory Visit: Payer: Self-pay | Admitting: Family Medicine

## 2018-01-22 MED ORDER — LOSARTAN POTASSIUM 50 MG PO TABS: 50.0000 mg | ORAL_TABLET | Freq: Every day | ORAL | 1 refills | Status: DC

## 2018-01-22 MED ORDER — VERAPAMIL HCL ER 120 MG PO TBCR: 120.0000 mg | EXTENDED_RELEASE_TABLET | Freq: Every day | ORAL | 0 refills | Status: DC

## 2018-01-22 NOTE — Telephone Encounter (Signed)
Copied from [place] [id number]. Topic: Quick Communication - Rx Refill/Question >> Jan 22, 2018  1:24 PM Yvette Rack wrote: Medication: losartan (COZAAR) 50 MG tablet  pt want it in 1 tablet instead of two 25mg  tablets verapamil (CALAN-SR) 120 MG CR tablet   Has the patient contacted their pharmacy? No. (Agent: If no, request that the patient contact the pharmacy for the refill.) (Agent: If yes, when and what did the pharmacy advise?) wanted to use this one instead of mail order she use to use  Preferred Pharmacy (with phone number or street name):     [id number] [place] [place] [address] [phone] (Phone) [phone] (Fax)    Agent: Please be advised that RX refills may take up to 3 business days. We ask that you follow-up with your pharmacy.

## 2018-01-22 NOTE — Telephone Encounter (Signed)
Pharmacy change Requested Prescriptions  Pending Prescriptions Disp Refills  . verapamil (CALAN-SR) 120 MG CR tablet 90 tablet 0    Sig: Take 1 tablet (120 mg total) by mouth at bedtime.     Off-Protocol Failed - 01/22/2018  2:32 PM      Failed - Medication not assigned to a protocol, review manually.      Passed - Valid encounter within last 12 months    Recent Outpatient Visits          3 weeks ago Essential hypertension   [facility] [place], MD   5 months ago Preventative health care   [place] [facility] [place], MD   10 months ago [place] [facility] [place], Gay Filler, MD   1 year ago Mixed hyperlipidemia   [facility] at [facility] [name], MD   1 year ago Hyperlipidemia, mixed   [facility] at [place], MD      Future Appointments            In 3 months [name], MD [place] [facility]         . losartan (COZAAR) 50 MG tablet 90 tablet 1    Sig: Take 1 tablet (50 mg total) by mouth daily.     Cardiovascular:  Angiotensin Receptor Blockers Passed - 01/22/2018  2:32 PM      Passed - Cr in normal range and within 180 days    Creat  Date Value Ref Range Status  12/28/2012 0.63 0.50 - 1.10 mg/dL Final   Creatinine, Ser  Date Value Ref Range Status  12/04/2017 0.65 0.40 - 1.20 mg/dL Final         Passed - K in normal range and within 180 days    Potassium  Date Value Ref Range Status  12/04/2017 4.4 3.5 - 5.1 mEq/L Final         Passed - Patient is not pregnant      Passed - Last BP in normal range    BP Readings from Last 1 Encounters:  12/29/17 118/80         Passed - Valid encounter within last 6 months    Recent Outpatient Visits          3 weeks ago Essential hypertension   [facility] at [place], MD   5 months ago Preventative health care   [place] [facility] [place], [place]   10 months ago [place] [facility] [place], Gay Filler, MD   1 year ago Mixed hyperlipidemia   [facility] at [place], MD   1 year ago Hyperlipidemia, mixed   [facility] at [place], MD      Future Appointments            In 3 months [name], MD [place] [facility] [place]

## 2018-02-08 ENCOUNTER — Telehealth: Payer: Self-pay

## 2018-02-08 MED ORDER — IRBESARTAN 150 MG PO TABS: 150.0000 mg | ORAL_TABLET | Freq: Every day | ORAL | 2 refills | Status: DC

## 2018-02-08 NOTE — Telephone Encounter (Signed)
Rx sent. MyChart message sent to Pt to inform.

## 2018-02-08 NOTE — Telephone Encounter (Signed)
Switch to Irbesartan 150 mg daily, disp #30 with 2 rf or #90 at patient discretion and make sure she is seen with bp check before that runs out. If they have it of course

## 2018-02-08 NOTE — Telephone Encounter (Signed)
Copied from [place] [phone]. Topic: General - Other >> Feb 05, 2018  3:32 PM Janace Aris A wrote: Reason for CRM[facility] Says [facility] sent a couple request over via fax regarding not having the losartan 50 mg in stock, they would need another alternative sent to the Pharmacy.

## 2018-03-30 ENCOUNTER — Telehealth: Payer: Self-pay | Admitting: Family Medicine

## 2018-03-30 ENCOUNTER — Other Ambulatory Visit: Payer: Self-pay | Admitting: Family Medicine

## 2018-03-30 MED ORDER — ATORVASTATIN CALCIUM 20 MG PO TABS: 20.0000 mg | ORAL_TABLET | Freq: Every day | ORAL | 1 refills | Status: DC

## 2018-03-30 NOTE — Telephone Encounter (Signed)
Copied from [place] [id number]. Topic: Quick Communication - Rx Refill/Question >> Mar 30, 2018  5:08 PM Windy Kalata wrote: Medication: atorvastatin (LIPITOR) 20 MG tablet  Has the patient contacted their pharmacy? No. (Agent: If no, request that the patient contact the pharmacy for the refill.) (Agent: If yes, when and what did the pharmacy advise?)  Preferred Pharmacy (with phone number or street name): [id number] [place], [place] [phone] (Phone) [phone] (Fax)    Agent: Please be advised that RX refills may take up to 3 business days. We ask that you follow-up with your pharmacy.

## 2018-03-30 NOTE — Telephone Encounter (Unsigned)
Copied from [place] [id number]. Topic: Quick Communication - Rx Refill/Question >> Mar 30, 2018  5:08 PM Windy Kalata wrote: Medication: atorvastatin (LIPITOR) 20 MG tablet  Has the patient contacted their pharmacy? No. (Agent: If no, request that the patient contact the pharmacy for the refill.) (Agent: If yes, when and what did the pharmacy advise?)  Preferred Pharmacy (with phone number or street name): [id number] [place], [place] [phone] (Phone) [phone] (Fax)    Agent: Please be advised that RX refills may take up to 3 business days. We ask that you follow-up with your pharmacy.

## 2018-04-30 ENCOUNTER — Other Ambulatory Visit: Payer: PRIVATE HEALTH INSURANCE

## 2018-05-04 ENCOUNTER — Ambulatory Visit: Payer: PRIVATE HEALTH INSURANCE | Admitting: Family Medicine

## 2018-05-07 ENCOUNTER — Other Ambulatory Visit: Payer: Self-pay | Admitting: Family Medicine

## 2018-05-07 MED ORDER — IRBESARTAN 150 MG PO TABS: 150.0000 mg | ORAL_TABLET | Freq: Every day | ORAL | 0 refills | Status: DC

## 2018-05-07 MED ORDER — FUROSEMIDE 20 MG PO TABS: 20.0000 mg | ORAL_TABLET | Freq: Every day | ORAL | 0 refills | Status: DC

## 2018-05-07 MED ORDER — METOPROLOL SUCCINATE ER 25 MG PO TB24: 25.0000 mg | ORAL_TABLET | Freq: Every day | ORAL | 0 refills | Status: DC

## 2018-05-07 MED ORDER — VERAPAMIL HCL ER 120 MG PO TBCR: 120.0000 mg | EXTENDED_RELEASE_TABLET | Freq: Every day | ORAL | 0 refills | Status: DC

## 2018-05-07 NOTE — Telephone Encounter (Signed)
Copied from [place] [id number]. Topic: Quick Communication - Rx Refill/Question >> May 07, 2018  2:38 PM Margot Ables wrote: Medication: furosemide (LASIX) 20 MG tablet, metoprolol succinate (TOPROL-XL) 25 MG 24 hr tablet, verapamil (CALAN-SR) 120 MG CR tablet, irbesartan (AVAPRO) 150 MG tablet - [facility] - [facility] is no [facility] using [facility] - pt states she needs refills for 90 day supply  Has the patient contacted their pharmacy? No- changing pharmacy Preferred Pharmacy (with phone number or street name): [id number] [place], [place] [phone] (Phone) [phone] (Fax)

## 2018-05-07 NOTE — Telephone Encounter (Signed)
Pt switching pharmacies 90 day courtesy refill given  Requested Prescriptions  Pending Prescriptions Disp Refills  . furosemide (LASIX) 20 MG tablet 90 tablet 0    Sig: Take 1 tablet (20 mg total) by mouth daily.     Cardiovascular:  Diuretics - Loop Passed - 05/07/2018  2:54 PM      Passed - K in normal range and within 360 days    Potassium  Date Value Ref Range Status  12/04/2017 4.4 3.5 - 5.1 mEq/L Final         Passed - Ca in normal range and within 360 days    Calcium  Date Value Ref Range Status  12/04/2017 9.2 8.4 - 10.5 mg/dL Final         Passed - Na in normal range and within 360 days    Sodium  Date Value Ref Range Status  12/04/2017 140 135 - 145 mEq/L Final         Passed - Cr in normal range and within 360 days    Creat  Date Value Ref Range Status  12/28/2012 0.63 0.50 - 1.10 mg/dL Final   Creatinine, Ser  Date Value Ref Range Status  12/04/2017 0.65 0.40 - 1.20 mg/dL Final         Passed - Last BP in normal range    BP Readings from Last 1 Encounters:  12/29/17 118/80         Passed - Valid encounter within last 6 months    Recent Outpatient Visits          4 months ago Essential hypertension   [facility] [place], MD   9 months ago Preventative health care   [place] [facility] [place], [place]   1 year ago [place] [facility] [place], Gay Filler, MD   1 year ago Mixed hyperlipidemia   [facility] at [place], Bonnita Levan, MD   1 year ago Hyperlipidemia, mixed   [facility] at [place], MD      Future Appointments            In 3 weeks [name], MD [place] at [facility]         . metoprolol succinate (TOPROL-XL) 25 MG 24 hr tablet 90 tablet 0    Sig: Take 1 tablet (25 mg total) by  mouth daily.     Cardiovascular:  Beta Blockers Passed - 05/07/2018  2:54 PM      Passed - Last BP in normal range    BP Readings from Last 1 Encounters:  12/29/17 118/80         Passed - Last Heart Rate in normal range    Pulse Readings from Last 1 Encounters:  12/29/17 84         Passed - Valid encounter within last 6 months    Recent Outpatient Visits          4 months ago Essential hypertension   [facility] at [place], MD   9 months ago Preventative health care   [place] at [place], MD   1 year ago [place] at [place], Gay Filler, MD  1 year ago Mixed hyperlipidemia   [facility] at [place], MD   1 year ago Hyperlipidemia, mixed   [facility] at [place], MD      Future Appointments            In 3 weeks [name], MD [place] at [facility]         . verapamil (CALAN-SR) 120 MG CR tablet 90 tablet 0    Sig: Take 1 tablet (120 mg total) by mouth at bedtime.     Cardiovascular:  Calcium Channel Blockers Passed - 05/07/2018  2:54 PM      Passed - Last BP in normal range    BP Readings from Last 1 Encounters:  12/29/17 118/80         Passed - Valid encounter within last 6 months    Recent Outpatient Visits          4 months ago Essential hypertension   [facility] at [place], MD   9 months ago Preventative health care   [place] at [place], [place]   1 year ago [place] at [place], Gay Filler, MD   1 year ago Mixed hyperlipidemia   [facility] at [place], MD   1 year ago Hyperlipidemia,  mixed   [facility] at [place], MD      Future Appointments            In 3 weeks [name], MD [place] at [facility], [place]         . irbesartan (AVAPRO) 150 MG tablet 90 tablet 0    Sig: Take 1 tablet (150 mg total) by mouth daily.     Cardiovascular:  Angiotensin Receptor Blockers Passed - 05/07/2018  2:54 PM      Passed - Cr in normal range and within 180 days    Creat  Date Value Ref Range Status  12/28/2012 0.63 0.50 - 1.10 mg/dL Final   Creatinine, Ser  Date Value Ref Range Status  12/04/2017 0.65 0.40 - 1.20 mg/dL Final         Passed - K in normal range and within 180 days    Potassium  Date Value Ref Range Status  12/04/2017 4.4 3.5 - 5.1 mEq/L Final         Passed - Patient is not pregnant      Passed - Last BP in normal range    BP Readings from Last 1 Encounters:  12/29/17 118/80         Passed - Valid encounter within last 6 months    Recent Outpatient Visits          4 months ago Essential hypertension   [facility] at [place], MD   9 months ago Preventative health care   [place] at [place], MD   1 year ago [place] at [place], Gay Filler, MD   1 year ago Mixed hyperlipidemia   [facility] at [place], Bonnita Levan, MD   1  year ago Hyperlipidemia, mixed   [facility] at [place], MD      Future Appointments            In 3 weeks [name], MD [place] at [place]

## 2018-05-26 ENCOUNTER — Other Ambulatory Visit: Payer: Self-pay | Admitting: Family Medicine

## 2018-05-26 ENCOUNTER — Other Ambulatory Visit: Payer: PRIVATE HEALTH INSURANCE

## 2018-05-26 MED ORDER — OMEPRAZOLE 40 MG PO CPDR: 40.0000 mg | DELAYED_RELEASE_CAPSULE | Freq: Every day | ORAL | 3 refills | Status: DC

## 2018-05-26 NOTE — Telephone Encounter (Signed)
Copied from [place] [id number]. Topic: Quick Communication - Rx Refill/Question >> May 26, 2018  4:20 PM Sheran Luz wrote: Medication: omeprazole (PRILOSEC) 40 MG capsule   Patient is requesting refill.   Preferred Pharmacy (with phone number or street name):[id number] [place], [place]  [phone] (Phone) [phone] (Fax)

## 2018-05-26 NOTE — Telephone Encounter (Signed)
Refills sent

## 2018-06-01 ENCOUNTER — Ambulatory Visit: Payer: PRIVATE HEALTH INSURANCE | Admitting: Family Medicine

## 2018-07-27 ENCOUNTER — Other Ambulatory Visit: Payer: PRIVATE HEALTH INSURANCE

## 2018-08-06 ENCOUNTER — Other Ambulatory Visit: Payer: Self-pay | Admitting: Family Medicine

## 2018-08-07 ENCOUNTER — Other Ambulatory Visit: Payer: Self-pay | Admitting: Family Medicine

## 2018-08-09 NOTE — Telephone Encounter (Signed)
Pt calling to find out if these will be refilled.

## 2018-08-13 ENCOUNTER — Ambulatory Visit: Payer: PRIVATE HEALTH INSURANCE | Admitting: Family Medicine

## 2018-09-14 ENCOUNTER — Other Ambulatory Visit: Payer: Self-pay | Admitting: Family Medicine

## 2018-10-30 ENCOUNTER — Other Ambulatory Visit: Payer: Self-pay | Admitting: Family Medicine

## 2018-11-03 ENCOUNTER — Other Ambulatory Visit: Payer: Self-pay | Admitting: Family Medicine

## 2018-11-04 ENCOUNTER — Other Ambulatory Visit: Payer: Self-pay | Admitting: *Deleted

## 2018-11-04 MED ORDER — FUROSEMIDE 20 MG PO TABS: 20.0000 mg | ORAL_TABLET | Freq: Every day | ORAL | 1 refills | Status: DC

## 2018-11-10 ENCOUNTER — Other Ambulatory Visit: Payer: Self-pay | Admitting: Family Medicine

## 2018-12-07 ENCOUNTER — Other Ambulatory Visit: Payer: Self-pay | Admitting: Family Medicine

## 2018-12-14 ENCOUNTER — Other Ambulatory Visit: Payer: Self-pay

## 2018-12-14 ENCOUNTER — Other Ambulatory Visit (INDEPENDENT_AMBULATORY_CARE_PROVIDER_SITE_OTHER): Payer: 59

## 2018-12-14 DIAGNOSIS — R739 Hyperglycemia, unspecified: Secondary | ICD-10-CM

## 2018-12-14 DIAGNOSIS — I10 Essential (primary) hypertension: Secondary | ICD-10-CM | POA: Diagnosis not present

## 2018-12-14 DIAGNOSIS — G43109 Migraine with aura, not intractable, without status migrainosus: Secondary | ICD-10-CM

## 2018-12-14 DIAGNOSIS — E210 Primary hyperparathyroidism: Secondary | ICD-10-CM | POA: Diagnosis not present

## 2018-12-14 DIAGNOSIS — E782 Mixed hyperlipidemia: Secondary | ICD-10-CM

## 2018-12-14 LAB — CBC
HCT: 41.8 % (ref 36.0–46.0)
Hemoglobin: 13.7 g/dL (ref 12.0–15.0)
MCHC: 32.7 g/dL (ref 30.0–36.0)
MCV: 89.5 fl (ref 78.0–100.0)
Platelets: 276 10*3/uL (ref 150.0–400.0)
RBC: 4.67 Mil/uL (ref 3.87–5.11)
RDW: 13.8 % (ref 11.5–15.5)
WBC: 5.3 10*3/uL (ref 4.0–10.5)

## 2018-12-14 LAB — TSH: TSH: 4.09 u[IU]/mL (ref 0.35–4.50)

## 2018-12-14 LAB — COMPREHENSIVE METABOLIC PANEL
ALT: 21 U/L (ref 0–35)
AST: 19 U/L (ref 0–37)
Albumin: 4.2 g/dL (ref 3.5–5.2)
Alkaline Phosphatase: 77 U/L (ref 39–117)
BUN: 15 mg/dL (ref 6–23)
CO2: 28 mEq/L (ref 19–32)
Calcium: 9.8 mg/dL (ref 8.4–10.5)
Chloride: 102 mEq/L (ref 96–112)
Creatinine, Ser: 0.66 mg/dL (ref 0.40–1.20)
GFR: 92.34 mL/min (ref >60.00)
Glucose, Bld: 116 mg/dL — ABNORMAL HIGH (ref 70–99)
Potassium: 4.4 mEq/L (ref 3.5–5.1)
Sodium: 139 mEq/L (ref 135–145)
Total Bilirubin: 0.3 mg/dL (ref 0.2–1.2)
Total Protein: 6.8 g/dL (ref 6.0–8.3)

## 2018-12-14 LAB — LIPID PANEL
Cholesterol: 229 mg/dL — ABNORMAL HIGH (ref 0–200)
HDL: 50.3 mg/dL (ref >39.00)
LDL Cholesterol: 145 mg/dL — ABNORMAL HIGH (ref 0–99)
NonHDL: 178.67
Total CHOL/HDL Ratio: 5
Triglycerides: 170 mg/dL — ABNORMAL HIGH (ref 0.0–149.0)
VLDL: 34 mg/dL (ref 0.0–40.0)

## 2018-12-14 LAB — HEMOGLOBIN A1C: Hgb A1c MFr Bld: 5.9 % (ref 4.6–6.5)

## 2018-12-14 LAB — VITAMIN D 25 HYDROXY (VIT D DEFICIENCY, FRACTURES): VITD: 37.31 ng/mL (ref 30.00–100.00)

## 2018-12-15 LAB — PTH, INTACT AND CALCIUM
Calcium: 9.8 mg/dL (ref 8.6–10.4)
PTH: 46 pg/mL (ref 14–64)

## 2018-12-20 ENCOUNTER — Other Ambulatory Visit: Payer: Self-pay

## 2018-12-20 ENCOUNTER — Ambulatory Visit (INDEPENDENT_AMBULATORY_CARE_PROVIDER_SITE_OTHER): Payer: 59 | Admitting: Family Medicine

## 2018-12-20 VITALS — BP 136/88 | HR 82 | Temp 98.5°F | Resp 17 | Ht 66.0 in | Wt 190.4 lb

## 2018-12-20 DIAGNOSIS — R739 Hyperglycemia, unspecified: Secondary | ICD-10-CM

## 2018-12-20 DIAGNOSIS — R7989 Other specified abnormal findings of blood chemistry: Secondary | ICD-10-CM

## 2018-12-20 DIAGNOSIS — E782 Mixed hyperlipidemia: Secondary | ICD-10-CM | POA: Diagnosis not present

## 2018-12-20 DIAGNOSIS — E079 Disorder of thyroid, unspecified: Secondary | ICD-10-CM | POA: Insufficient documentation

## 2018-12-20 DIAGNOSIS — I10 Essential (primary) hypertension: Secondary | ICD-10-CM | POA: Diagnosis not present

## 2018-12-20 DIAGNOSIS — G43909 Migraine, unspecified, not intractable, without status migrainosus: Secondary | ICD-10-CM

## 2018-12-20 DIAGNOSIS — E069 Thyroiditis, unspecified: Secondary | ICD-10-CM | POA: Insufficient documentation

## 2018-12-20 DIAGNOSIS — R252 Cramp and spasm: Secondary | ICD-10-CM

## 2018-12-20 LAB — MAGNESIUM: Magnesium: 2.1 mg/dL (ref 1.5–2.5)

## 2018-12-20 LAB — T4, FREE: Free T4: 0.77 ng/dL (ref 0.60–1.60)

## 2018-12-20 NOTE — Assessment & Plan Note (Signed)
Gets scomata off and on. 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

## 2018-12-20 NOTE — Assessment & Plan Note (Signed)
On Keto diet, increase hydration check magnesium

## 2018-12-20 NOTE — Assessment & Plan Note (Signed)
Tolerating statin, encouraged heart healthy diet, avoid trans fats, minimize simple carbs and saturated fats. Increase exercise as tolerated 

## 2018-12-20 NOTE — Progress Notes (Signed)
Subjective:    Patient ID: Sandra [name], female    DOB: 1961-02-12, 58 y.o.   MRN: [id number]  Chief Complaint  Patient presents with  . Hypertension    No complaints. Pt has been doing KETO diet to lose weight and help with BP,  and is concerned of leg cramps since starting this.     HPI Patient is in today for follow up on chronic medical concerns including arthritis with shoulder pain, hyperlipidemia, hypertension, hyperglycemia and more. She is scheduled for shoulder replacement in November with Dr Luci Bank with [facility] in [facility] and is anxious to proceed due to level of pain and debility. Her mother and niece have been diagnosed with epilepsy and now she is wondering if her TIA could have been a seizure in the past. No recent episodes and she will be seeing her neurologist to discuss soon. No recent febrile illness or hospitalizations. Denies CP/palp/SOB/HA/congestion/fevers/GI or GU c/o. Taking meds as prescribed  Past Medical History:  Diagnosis Date  . Allergic rhinitis 05/31/2012  . Chest pain    PT SAW CARDIOLOGIST DR. Burt Knack - STRESS ECHO DONE NEGATIVE - NO PROBLEM SINCE  . Chicken pox as achild  . Cough 11/29/2012   RESOLVED - IT WAS CAUSED BY LISINOPRIL  . Elevated LFTs 05/31/2012  . Eustachian tube dysfunction 07/27/2017  . GERD (gastroesophageal reflux disease)   . H/O tobacco use, presenting hazards to health 11/29/2012   Smoked 1 1/2 ppd for 20 years quit in roughtly 2007  . Headache 11/25/2015  . Hematuria 04/09/2014  . Hoarseness 02/04/2012   RESOLVED - THOUGHT TO HAVE BEEN ALLERY RELATED  . Hyperglycemia 04/06/2014  . Hyperlipidemia   . Hyperparathyroidism ([place])   . Hypertension   . Obesity, unspecified 05/31/2012  . Ocular migraine   . Overactive bladder 05/31/2012  . Preventative health care 02/04/2012  . Rapid heart beat    PT FEELS PROB RELATED TO HYPER PARATHYROIDISM  . SUI (stress urinary incontinence, female) 02/04/2012   Sees Dr Perlie Gold and  they have discussed a bladder tack but so far she declines    Past Surgical History:  Procedure Laterality Date  . BREAST SURGERY     left breast excision of adenoma  . btl    . PARATHYROIDECTOMY Right 08/12/2013   Procedure: right inferior frozen section PARATHYROIDECTOMY;  Surgeon: Earnstine Regal, MD;  Location: [facility];  Service: General;  Laterality: Right;  . PELVIC LAPAROSCOPY  03-2004  . TUBAL LIGATION    . WISDOM TOOTH EXTRACTION  30 yrs ago    Family History  Problem Relation Age of Onset  . Hypertension Mother 36       alive  . Renal cancer Mother        renal  . Hyperlipidemia Mother   . Osteoporosis Mother        humerus   . Macular degeneration Mother   . Tremor Mother   . Hypertension Father 76       alive  . Atrial fibrillation Father   . Colon cancer Father        colon  . Hyperlipidemia Father   . Stroke Father   . Hypertension Brother 42       alive  . Hypertension Sister   . Lung cancer Maternal Grandfather        lung/ smoker  . Emphysema Maternal Grandfather   . Heart disease Paternal Grandmother   . Other Paternal Grandfather  black lung  . Heart disease Paternal Grandfather   . Heart disease Paternal Aunt   . Heart disease Paternal Uncle     Social History   Socioeconomic History  . Marital status: Married    Spouse name: Not on file  . Number of children: 2  . Years of education: Not on file  . Highest education level: Not on file  Occupational History  . Occupation: Programmer, multimedia: [place]  . Financial resource strain: Not on file  . Food insecurity    Worry: Not on file    Inability: Not on file  . Transportation needs    Medical: Not on file    Non-medical: Not on file  Tobacco Use  . Smoking status: Former Smoker    Packs/day: 1.00    Years: 20.00    Pack years: 20.00    Types: Cigarettes    Quit date: 03/10/2005    Years since quitting: 13.7  . Smokeless tobacco: Never Used  Substance and  Sexual Activity  . Alcohol use: Yes    Comment: very rarely  . Drug use: No  . Sexual activity: Yes    Partners: Male  Lifestyle  . Physical activity    Days per week: Not on file    Minutes per session: Not on file  . Stress: Not on file  Relationships  . Social Herbalist on phone: Not on file    Gets together: Not on file    Attends religious service: Not on file    Active member of club or organization: Not on file    Attends meetings of clubs or organizations: Not on file    Relationship status: Not on file  . Intimate partner violence    Fear of current or ex partner: Not on file    Emotionally abused: Not on file    Physically abused: Not on file    Forced sexual activity: Not on file  Other Topics Concern  . Not on file  Social History Narrative  . Not on file    Outpatient Medications Prior to Visit  Medication Sig Dispense Refill  . aspirin EC 81 MG tablet Take 325 mg by mouth at bedtime.     Marland Kitchen atorvastatin (LIPITOR) 20 MG tablet TAKE 1 TABLET BY MOUTH EVERY DAY 90 tablet 1  . Cholecalciferol (VITAMIN D PO) Take 2,000 mg by mouth daily. Vitamin D    . Co-Enzyme Q-10 30 MG CAPS Take 100 mg by mouth at bedtime.    . DOCOSAHEXAENOIC ACID PO Take 1 tablet by mouth daily.    . furosemide (LASIX) 20 MG tablet Take 1 tablet (20 mg total) by mouth daily. 90 tablet 1  . irbesartan (AVAPRO) 150 MG tablet TAKE 1 TABLET BY MOUTH EVERY DAY 90 tablet 0  . lactobacillus acidophilus (BACID) TABS tablet Take 2 tablets by mouth 3 (three) times daily.    . metoprolol succinate (TOPROL-XL) 25 MG 24 hr tablet TAKE 1 TABLET BY MOUTH EVERY DAY 90 tablet 0  . omeprazole (PRILOSEC) 40 MG capsule Take 1 capsule (40 mg total) by mouth daily. 90 capsule 3  . OVER THE COUNTER MEDICATION Take 1 capsule by mouth daily. Mega red- 1 capsule daily    . verapamil (CALAN-SR) 120 MG CR tablet TAKE 1 TABLET BY MOUTH EVERYDAY AT BEDTIME 90 tablet 0   No facility-administered medications  prior to visit.     Allergies  Allergen Reactions  . Cefdinir Other (See Comments)    Severe Stomach cramps and diarrhea for several days.  . Doxycycline Diarrhea  . Codeine Nausea And Vomiting  . Penicillins     RASH, ITCHING  . Simvastatin     myalgias    Review of Systems  Constitutional: Negative for fever and malaise/fatigue.  HENT: Negative for congestion.   Eyes: Negative for blurred vision, photophobia, pain, discharge and redness.  Respiratory: Negative for shortness of breath.   Cardiovascular: Negative for chest pain, palpitations and leg swelling.  Gastrointestinal: Negative for abdominal pain, blood in stool and nausea.  Genitourinary: Negative for dysuria and frequency.  Musculoskeletal: Positive for joint pain. Negative for falls.  Skin: Negative for rash.  Neurological: Negative for dizziness, loss of consciousness and headaches.  Endo/Heme/Allergies: Negative for environmental allergies.  Psychiatric/Behavioral: Negative for depression. The patient is not nervous/anxious.        Objective:    Physical Exam Vitals signs and nursing note reviewed.  Constitutional:      General: She is not in acute distress.    Appearance: She is well-developed.  HENT:     Head: Normocephalic and atraumatic.     Nose: Nose normal.  Eyes:     General:        Right eye: No discharge.        Left eye: No discharge.  Neck:     Musculoskeletal: Normal range of motion and neck supple.  Cardiovascular:     Rate and Rhythm: Normal rate and regular rhythm.     Heart sounds: No murmur.  Pulmonary:     Effort: Pulmonary effort is normal.     Breath sounds: Normal breath sounds.  Abdominal:     General: Bowel sounds are normal.     Palpations: Abdomen is soft.     Tenderness: There is no abdominal tenderness.  Skin:    General: Skin is warm and dry.  Neurological:     Mental Status: She is alert and oriented to person, place, and time.     BP 136/88 (BP Location: Left  Arm, Patient Position: Sitting, Cuff Size: Normal)   Pulse 82   Temp 98.5 F (36.9 C) (Oral)   Resp 17   Ht 5\' 6"  (1.676 m)   Wt 190 lb 6 oz (86.4 kg)   LMP 04/01/2011   SpO2 96%   BMI 30.73 kg/m  Wt Readings from Last 3 Encounters:  12/20/18 190 lb 6 oz (86.4 kg)  12/29/17 199 lb 6.4 oz (90.4 kg)  07/27/17 191 lb 3.2 oz (86.7 kg)    Diabetic Foot Exam - Simple   No data filed     Lab Results  Component Value Date   WBC 5.3 12/14/2018   HGB 13.7 12/14/2018   HCT 41.8 12/14/2018   PLT 276.0 12/14/2018   GLUCOSE 116 (H) 12/14/2018   CHOL 229 (H) 12/14/2018   TRIG 170.0 (H) 12/14/2018   HDL 50.30 12/14/2018   LDLDIRECT 201.0 12/14/2015   LDLCALC 145 (H) 12/14/2018   ALT 21 12/14/2018   AST 19 12/14/2018   NA 139 12/14/2018   K 4.4 12/14/2018   CL 102 12/14/2018   CREATININE 0.66 12/14/2018   BUN 15 12/14/2018   CO2 28 12/14/2018   TSH 4.09 12/14/2018   HGBA1C 5.9 12/14/2018    Lab Results  Component Value Date   TSH 4.09 12/14/2018   Lab Results  Component Value Date   WBC 5.3 12/14/2018  HGB 13.7 12/14/2018   HCT 41.8 12/14/2018   MCV 89.5 12/14/2018   PLT 276.0 12/14/2018   Lab Results  Component Value Date   NA 139 12/14/2018   K 4.4 12/14/2018   CO2 28 12/14/2018   GLUCOSE 116 (H) 12/14/2018   BUN 15 12/14/2018   CREATININE 0.66 12/14/2018   BILITOT 0.3 12/14/2018   ALKPHOS 77 12/14/2018   AST 19 12/14/2018   ALT 21 12/14/2018   PROT 6.8 12/14/2018   ALBUMIN 4.2 12/14/2018   CALCIUM 9.8 12/14/2018   CALCIUM 9.8 12/14/2018   GFR 92.34 12/14/2018   Lab Results  Component Value Date   CHOL 229 (H) 12/14/2018   Lab Results  Component Value Date   HDL 50.30 12/14/2018   Lab Results  Component Value Date   LDLCALC 145 (H) 12/14/2018   Lab Results  Component Value Date   TRIG 170.0 (H) 12/14/2018   Lab Results  Component Value Date   CHOLHDL 5 12/14/2018   Lab Results  Component Value Date   HGBA1C 5.9 12/14/2018        Assessment & Plan:   Problem List Items Addressed This Visit    Hypertension    Well controlled, no changes to meds. Encouraged heart healthy diet such as the DASH diet and exercise as tolerated.       Hyperlipidemia    Tolerating statin, encouraged heart healthy diet, avoid trans fats, minimize simple carbs and saturated fats. Increase exercise as tolerated.      Migraine    Gets scomata off and on. 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       Hyperglycemia    hgba1c acceptable, minimize simple carbs. Increase exercise as tolerated.      Elevated TSH   Relevant Orders   T4, free   Muscle cramp - Primary    On Keto diet, increase hydration check magnesium      Relevant Orders   Magnesium      I am having Sandra [name] maintain her Cholecalciferol (VITAMIN D PO), OVER THE COUNTER MEDICATION, aspirin EC, lactobacillus acidophilus, Co-Enzyme Q-10, DOCOSAHEXAENOIC ACID PO, omeprazole, atorvastatin, verapamil, furosemide, metoprolol succinate, and irbesartan.  No orders of the defined types were placed in this encounter.    Penni Homans, MD

## 2018-12-20 NOTE — Assessment & Plan Note (Signed)
Well controlled, no changes to meds. Encouraged heart healthy diet such as the DASH diet and exercise as tolerated.  °

## 2018-12-20 NOTE — Patient Instructions (Addendum)
MIND diet or Weight Watchers   Hyland's leg cramp med  Pulse oximeter want oxygen in 90s   Omron blood pressure, upper arm   Weekly vitals    Muscle Cramps and Spasms Muscle cramps and spasms are when muscles tighten by themselves. They usually get better within minutes. Muscle cramps are painful. They are usually stronger and last longer than muscle spasms. Muscle spasms may or may not be painful. They can last a few seconds or much longer. Cramps and spasms can affect any muscle, but they occur most often in the calf muscles of the leg. They are usually not caused by a serious problem. In many cases, the cause is not known. Some common causes include:  Doing more physical work or exercise than your body is ready for.  Using the muscles too much (overuse) by repeating certain movements too many times.  Staying in a certain position for a long time.  Playing a sport or doing an activity without preparing properly.  Using bad form or technique while playing a sport or doing an activity.  Not having enough water in your body (dehydration).  Injury.  Side effects of some medicines.  Low levels of the salts and minerals in your blood (electrolytes), such as low potassium or calcium. Follow these instructions at home: Managing pain and stiffness      Massage, stretch, and relax the muscle. Do this for many minutes at a time.  If told, put heat on tight or tense muscles as often as told by your doctor. Use the heat source that your doctor recommends, such as a moist heat pack or a heating pad. ? Place a towel between your skin and the heat source. ? Leave the heat on for 20-30 minutes. ? Remove the heat if your skin turns bright red. This is very important if you are not able to feel pain, heat, or cold. You may have a greater risk of getting burned.  If told, put ice on the affected area. This may help if you are sore or have pain after a cramp or spasm. ? Put ice in a  plastic bag. ? Place a towel between your skin and the bag. ? Leave the ice on for 20 minutes, 2-3 times a day.  Try taking hot showers or baths to help relax tight muscles. Eating and drinking  Drink enough fluid to keep your pee (urine) pale yellow.  Eat a healthy diet to help ensure that your muscles work well. This should include: ? Fruits and vegetables. ? Lean protein. ? Whole grains. ? Low-fat or nonfat dairy products. General instructions  If you are having cramps often, avoid intense exercise for several days.  Take over-the-counter and prescription medicines only as told by your doctor.  Watch for any changes in your symptoms.  Keep all follow-up visits as told by your doctor. This is important. Contact a doctor if:  Your cramps or spasms get worse or happen more often.  Your cramps or spasms do not get better with time. Summary  Muscle cramps and spasms are when muscles tighten by themselves. They usually get better within minutes.  Cramps and spasms occur most often in the calf muscles of the leg.  Massage, stretch, and relax the muscle. This may help the cramp or spasm go away.  Drink enough fluid to keep your pee (urine) pale yellow. This information is not intended to replace advice given to you by your health care provider. Make sure you  discuss any questions you have with your health care provider. Document Released: 02/07/2008 Document Revised: 07/20/2017 Document Reviewed: 07/20/2017 Elsevier Patient Education  2020 [employer].

## 2018-12-20 NOTE — Assessment & Plan Note (Signed)
hgba1c acceptable, minimize simple carbs. Increase exercise as tolerated.  

## 2019-01-09 HISTORY — PX: TOTAL SHOULDER REPLACEMENT: SUR1217

## 2019-02-28 ENCOUNTER — Other Ambulatory Visit: Payer: Self-pay | Admitting: Family Medicine

## 2019-03-01 ENCOUNTER — Other Ambulatory Visit: Payer: Self-pay | Admitting: Family Medicine

## 2019-04-06 ENCOUNTER — Other Ambulatory Visit: Payer: Self-pay | Admitting: Obstetrics and Gynecology

## 2019-04-06 DIAGNOSIS — N6320 Unspecified lump in the left breast, unspecified quadrant: Secondary | ICD-10-CM

## 2019-04-27 ENCOUNTER — Other Ambulatory Visit: Payer: Self-pay

## 2019-04-27 ENCOUNTER — Ambulatory Visit
Admission: RE | Admit: 2019-04-27 | Discharge: 2019-04-27 | Disposition: A | Discharge status: Home / Self Care | Payer: 59 | Source: Ambulatory Visit | Attending: Obstetrics and Gynecology | Admitting: Obstetrics and Gynecology

## 2019-04-27 DIAGNOSIS — N6320 Unspecified lump in the left breast, unspecified quadrant: Secondary | ICD-10-CM

## 2019-04-28 ENCOUNTER — Ambulatory Visit: Payer: 59 | Admitting: Family Medicine

## 2019-05-02 ENCOUNTER — Other Ambulatory Visit: Payer: Self-pay

## 2019-05-03 ENCOUNTER — Ambulatory Visit (INDEPENDENT_AMBULATORY_CARE_PROVIDER_SITE_OTHER): Payer: 59 | Admitting: Family Medicine

## 2019-05-03 ENCOUNTER — Other Ambulatory Visit: Payer: Self-pay

## 2019-05-03 ENCOUNTER — Encounter: Payer: Self-pay | Admitting: Family Medicine

## 2019-05-03 ENCOUNTER — Other Ambulatory Visit: Payer: Self-pay | Admitting: Family Medicine

## 2019-05-03 VITALS — BP 140/100 | HR 91 | Temp 97.3°F | Ht 66.0 in | Wt 212.2 lb

## 2019-05-03 DIAGNOSIS — R739 Hyperglycemia, unspecified: Secondary | ICD-10-CM

## 2019-05-03 DIAGNOSIS — G4733 Obstructive sleep apnea (adult) (pediatric): Secondary | ICD-10-CM | POA: Insufficient documentation

## 2019-05-03 DIAGNOSIS — I10 Essential (primary) hypertension: Secondary | ICD-10-CM | POA: Diagnosis not present

## 2019-05-03 DIAGNOSIS — E210 Primary hyperparathyroidism: Secondary | ICD-10-CM

## 2019-05-03 DIAGNOSIS — E6609 Other obesity due to excess calories: Secondary | ICD-10-CM

## 2019-05-03 DIAGNOSIS — E782 Mixed hyperlipidemia: Secondary | ICD-10-CM | POA: Diagnosis not present

## 2019-05-03 DIAGNOSIS — M2550 Pain in unspecified joint: Secondary | ICD-10-CM

## 2019-05-03 DIAGNOSIS — R0683 Snoring: Secondary | ICD-10-CM | POA: Diagnosis not present

## 2019-05-03 DIAGNOSIS — E069 Thyroiditis, unspecified: Secondary | ICD-10-CM

## 2019-05-03 LAB — COMPREHENSIVE METABOLIC PANEL
ALT: 28 U/L (ref 0–35)
AST: 21 U/L (ref 0–37)
Albumin: 4.6 g/dL (ref 3.5–5.2)
Alkaline Phosphatase: 110 U/L (ref 39–117)
BUN: 17 mg/dL (ref 6–23)
CO2: 29 mEq/L (ref 19–32)
Calcium: 10 mg/dL (ref 8.4–10.5)
Chloride: 101 mEq/L (ref 96–112)
Creatinine, Ser: 0.61 mg/dL (ref 0.40–1.20)
GFR: 100.99 mL/min (ref >60.00)
Glucose, Bld: 95 mg/dL (ref 70–99)
Potassium: 4.1 mEq/L (ref 3.5–5.1)
Sodium: 139 mEq/L (ref 135–145)
Total Bilirubin: 0.4 mg/dL (ref 0.2–1.2)
Total Protein: 7.2 g/dL (ref 6.0–8.3)

## 2019-05-03 LAB — CBC
HCT: 39.3 % (ref 36.0–46.0)
Hemoglobin: 13.1 g/dL (ref 12.0–15.0)
MCHC: 33.4 g/dL (ref 30.0–36.0)
MCV: 87.5 fl (ref 78.0–100.0)
Platelets: 284 10*3/uL (ref 150.0–400.0)
RBC: 4.5 Mil/uL (ref 3.87–5.11)
RDW: 13.8 % (ref 11.5–15.5)
WBC: 6.5 10*3/uL (ref 4.0–10.5)

## 2019-05-03 LAB — SEDIMENTATION RATE: Sed Rate: 13 mm/hr (ref 0–30)

## 2019-05-03 LAB — TSH: TSH: 2.59 u[IU]/mL (ref 0.35–4.50)

## 2019-05-03 LAB — VITAMIN D 25 HYDROXY (VIT D DEFICIENCY, FRACTURES): VITD: 45.68 ng/mL (ref 30.00–100.00)

## 2019-05-03 LAB — T4, FREE: Free T4: 0.53 ng/dL — ABNORMAL LOW (ref 0.60–1.60)

## 2019-05-03 MED ORDER — LEVOTHYROXINE SODIUM 25 MCG PO TABS: 25.0000 ug | ORAL_TABLET | Freq: Every day | ORAL | 3 refills | Status: DC

## 2019-05-03 NOTE — Patient Instructions (Addendum)
Omron Blood Pressure cuff, upper arm, want BP 100-140/60-90 Pulse oximeter, want oxygen in 90s  Weekly vitals  Take Multivitamin with minerals, selenium Vitamin D 1000-2000 IU daily Probiotic with lactobacillus and bifidophilus Asprin EC 81 mg daily  Melatonin 2-5 mg at bedtime  [email or web address]  Magnesium Glycinate 400 mg at bedtime [email or web address]

## 2019-05-03 NOTE — Assessment & Plan Note (Signed)
Is following with neurology at [facility]. They have released her for TIAs but they are mailing her home sleep study

## 2019-05-03 NOTE — Assessment & Plan Note (Signed)
Check labs 

## 2019-05-04 ENCOUNTER — Other Ambulatory Visit: Payer: Self-pay | Admitting: Family Medicine

## 2019-05-04 DIAGNOSIS — E069 Thyroiditis, unspecified: Secondary | ICD-10-CM

## 2019-05-04 DIAGNOSIS — M2550 Pain in unspecified joint: Secondary | ICD-10-CM | POA: Insufficient documentation

## 2019-05-04 NOTE — Assessment & Plan Note (Signed)
RF and ANA are normal. She does endorse stiffness and pain most notable in am c/w OA. Encouraged to stay as active as able and use tylenol and topical treatments prn

## 2019-05-04 NOTE — Assessment & Plan Note (Signed)
Free T4 suppressed and thyroid peroxidase antibody elevated. Referred to endocrinology for further evaluation.

## 2019-05-04 NOTE — Assessment & Plan Note (Signed)
hgba1c acceptable, minimize simple carbs. Increase exercise as tolerated. Continue current meds 

## 2019-05-04 NOTE — Assessment & Plan Note (Signed)
Well controlled, no changes to meds. Encouraged heart healthy diet such as the DASH diet and exercise as tolerated.  °

## 2019-05-04 NOTE — Assessment & Plan Note (Signed)
Encouraged heart healthy diet, increase exercise, avoid trans fats, consider a krill oil cap daily 

## 2019-05-04 NOTE — Assessment & Plan Note (Signed)
Encouraged DASH diet, decrease po intake and increase exercise as tolerated. Needs 7-8 hours of sleep nightly. Avoid trans fats, eat small, frequent meals every 4-5 hours with lean proteins, complex carbs and healthy fats. Minimize simple carbs, consider weight watchers app

## 2019-05-04 NOTE — Progress Notes (Signed)
Subjective:    Patient ID: Sandra [name], female    DOB: [birth year], 59 y.o.   MRN: [id number]  Chief Complaint  Patient presents with  . Follow-up    4 month    HPI Patient is in today for follow up on chronic medical concerns. She feels fairly well but has been noting increased pain. It is diffuse and noted in her hands/knuckles, feet, shoulders and more. It is symmetric. No redness or swelling. No fall or trauma. She is worried she could havve rheumatoid arthritis. 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 Denies CP/palp/SOB/HA/congestion/fevers/GI or GU c/o. Taking meds as prescribed  Past Medical History:  Diagnosis Date  . Allergic rhinitis 05/31/2012  . Chest pain    PT SAW CARDIOLOGIST DR. Burt Knack - STRESS ECHO DONE NEGATIVE - NO PROBLEM SINCE  . Chicken pox as achild  . Cough 11/29/2012   RESOLVED - IT WAS CAUSED BY LISINOPRIL  . Elevated LFTs 05/31/2012  . Eustachian tube dysfunction 07/27/2017  . GERD (gastroesophageal reflux disease)   . H/O tobacco use, presenting hazards to health 11/29/2012   Smoked 1 1/2 ppd for 20 years quit in roughtly 2007  . Headache 11/25/2015  . Hematuria 04/09/2014  . Hoarseness 02/04/2012   RESOLVED - THOUGHT TO HAVE BEEN ALLERY RELATED  . Hyperglycemia 04/06/2014  . Hyperlipidemia   . Hyperparathyroidism ([place])   . Hypertension   . Obesity, unspecified 05/31/2012  . Ocular migraine   . Overactive bladder 05/31/2012  . Preventative health care 02/04/2012  . Rapid heart beat    PT FEELS PROB RELATED TO HYPER PARATHYROIDISM  . SUI (stress urinary incontinence, female) 02/04/2012   Sees Dr Perlie Gold and they have discussed a bladder tack but so far she declines    Past Surgical History:  Procedure Laterality Date  . BREAST EXCISIONAL BIOPSY Left    Age 59 Fibroadenoma  . BREAST SURGERY     left breast excision of adenoma  . btl    . PARATHYROIDECTOMY Right 08/12/2013   Procedure: right  inferior frozen section PARATHYROIDECTOMY;  Surgeon: Earnstine Regal, MD;  Location: [facility];  Service: General;  Laterality: Right;  . PELVIC LAPAROSCOPY  03-2004  . TUBAL LIGATION    . WISDOM TOOTH EXTRACTION  30 yrs ago    Family History  Problem Relation Age of Onset  . Hypertension Mother 74       alive  . Renal cancer Mother        renal  . Hyperlipidemia Mother   . Osteoporosis Mother        humerus   . Macular degeneration Mother   . Tremor Mother   . Hypertension Father 69       alive  . Atrial fibrillation Father   . Colon cancer Father        colon  . Hyperlipidemia Father   . Stroke Father   . Hypertension Brother 23       alive  . Hypertension Sister   . Lung cancer Maternal Grandfather        lung/ smoker  . Emphysema Maternal Grandfather   . Heart disease Paternal Grandmother   . Other Paternal Grandfather        black lung  . Heart disease Paternal Grandfather   . Heart disease Paternal Aunt   . Heart disease Paternal Uncle     Social History   Socioeconomic History  . Marital status:  Married    Spouse name: Not on file  . Number of children: 2  . Years of education: Not on file  . Highest education level: Not on file  Occupational History  . Occupation: Programmer, multimedia: [employer]  Tobacco Use  . Smoking status: Former Smoker    Packs/day: 1.00    Years: 20.00    Pack years: 20.00    Types: Cigarettes    Quit date: 03/10/2005    Years since quitting: 14.1  . Smokeless tobacco: Never Used  Substance and Sexual Activity  . Alcohol use: Yes    Comment: very rarely  . Drug use: No  . Sexual activity: Yes    Partners: Male  Other Topics Concern  . Not on file  Social History Narrative  . Not on file   Social Determinants of Health   Financial Resource Strain:   . Difficulty of Paying Living Expenses: Not on file  Food Insecurity:   . Worried About Charity fundraiser in the Last Year: Not on file  . Ran Out of Food in the Last  Year: Not on file  Transportation Needs:   . Lack of Transportation (Medical): Not on file  . Lack of Transportation (Non-Medical): Not on file  Physical Activity:   . Days of Exercise per Week: Not on file  . Minutes of Exercise per Session: Not on file  Stress:   . Feeling of Stress : Not on file  Social Connections:   . Frequency of Communication with Friends and Family: Not on file  . Frequency of Social Gatherings with Friends and Family: Not on file  . Attends Religious Services: Not on file  . Active Member of Clubs or Organizations: Not on file  . Attends Archivist Meetings: Not on file  . Marital Status: Not on file  Intimate Partner Violence:   . Fear of Current or Ex-Partner: Not on file  . Emotionally Abused: Not on file  . Physically Abused: Not on file  . Sexually Abused: Not on file    Outpatient Medications Prior to Visit  Medication Sig Dispense Refill  . aspirin EC 81 MG tablet Take 325 mg by mouth at bedtime.     Marland Kitchen atorvastatin (LIPITOR) 20 MG tablet TAKE 1 TABLET BY MOUTH EVERY DAY 90 tablet 1  . Cholecalciferol (VITAMIN D PO) Take 6,000 mg by mouth daily. Vitamin D    . Co-Enzyme Q-10 30 MG CAPS Take 100 mg by mouth at bedtime.    . DOCOSAHEXAENOIC ACID PO Take 1 tablet by mouth daily.    . furosemide (LASIX) 20 MG tablet Take 1 tablet (20 mg total) by mouth daily. 90 tablet 1  . irbesartan (AVAPRO) 150 MG tablet TAKE 1 TABLET BY MOUTH EVERY DAY 90 tablet 0  . lactobacillus acidophilus (BACID) TABS tablet Take 2 tablets by mouth 3 (three) times daily.    . metoprolol succinate (TOPROL-XL) 25 MG 24 hr tablet TAKE 1 TABLET BY MOUTH EVERY DAY 90 tablet 0  . omeprazole (PRILOSEC) 40 MG capsule Take 1 capsule (40 mg total) by mouth daily. 90 capsule 3  . OVER THE COUNTER MEDICATION Take 1 capsule by mouth daily. Mega red- 1 capsule daily    . verapamil (CALAN-SR) 120 MG CR tablet TAKE 1 TABLET BY MOUTH EVERYDAY AT BEDTIME 90 tablet 0   No  facility-administered medications prior to visit.    Allergies  Allergen Reactions  . Cefdinir Other (See  Comments)    Severe Stomach cramps and diarrhea for several days.  . Doxycycline Diarrhea  . Codeine Nausea And Vomiting  . Penicillins     RASH, ITCHING  . Simvastatin     myalgias    Review of Systems  Constitutional: Positive for malaise/fatigue. Negative for fever.  HENT: Negative for congestion.   Eyes: Negative for blurred vision.  Respiratory: Negative for shortness of breath.   Cardiovascular: Negative for chest pain, palpitations and leg swelling.  Gastrointestinal: Negative for abdominal pain, blood in stool and nausea.  Genitourinary: Negative for dysuria and frequency.  Musculoskeletal: Positive for back pain and joint pain. Negative for falls.  Skin: Negative for rash.  Neurological: Negative for dizziness, loss of consciousness and headaches.  Endo/Heme/Allergies: Negative for environmental allergies.  Psychiatric/Behavioral: Negative for depression. The patient is nervous/anxious.        Objective:    Physical Exam  BP (!) 140/100 (BP Location: Left Arm, Patient Position: Sitting, Cuff Size: Normal)   Pulse 91   Temp (!) 97.3 F (36.3 C) (Temporal)   Ht 5\' 6"  (1.676 m)   Wt 212 lb 4 oz (96.3 kg)   LMP 04/01/2011   SpO2 96%   BMI 34.26 kg/m  Wt Readings from Last 3 Encounters:  05/03/19 212 lb 4 oz (96.3 kg)  12/20/18 190 lb 6 oz (86.4 kg)  12/29/17 199 lb 6.4 oz (90.4 kg)    Diabetic Foot Exam - Simple   No data filed     Lab Results  Component Value Date   WBC 6.5 05/03/2019   HGB 13.1 05/03/2019   HCT 39.3 05/03/2019   PLT 284.0 05/03/2019   GLUCOSE 95 05/03/2019   CHOL 229 (H) 12/14/2018   TRIG 170.0 (H) 12/14/2018   HDL 50.30 12/14/2018   LDLDIRECT 201.0 12/14/2015   LDLCALC 145 (H) 12/14/2018   ALT 28 05/03/2019   AST 21 05/03/2019   NA 139 05/03/2019   K 4.1 05/03/2019   CL 101 05/03/2019   CREATININE 0.61 05/03/2019    BUN 17 05/03/2019   CO2 29 05/03/2019   TSH 2.59 05/03/2019   HGBA1C 5.9 12/14/2018    Lab Results  Component Value Date   TSH 2.59 05/03/2019   Lab Results  Component Value Date   WBC 6.5 05/03/2019   HGB 13.1 05/03/2019   HCT 39.3 05/03/2019   MCV 87.5 05/03/2019   PLT 284.0 05/03/2019   Lab Results  Component Value Date   NA 139 05/03/2019   K 4.1 05/03/2019   CO2 29 05/03/2019   GLUCOSE 95 05/03/2019   BUN 17 05/03/2019   CREATININE 0.61 05/03/2019   BILITOT 0.4 05/03/2019   ALKPHOS 110 05/03/2019   AST 21 05/03/2019   ALT 28 05/03/2019   PROT 7.2 05/03/2019   ALBUMIN 4.6 05/03/2019   CALCIUM 10.0 05/03/2019   GFR 100.99 05/03/2019   Lab Results  Component Value Date   CHOL 229 (H) 12/14/2018   Lab Results  Component Value Date   HDL 50.30 12/14/2018   Lab Results  Component Value Date   LDLCALC 145 (H) 12/14/2018   Lab Results  Component Value Date   TRIG 170.0 (H) 12/14/2018   Lab Results  Component Value Date   CHOLHDL 5 12/14/2018   Lab Results  Component Value Date   HGBA1C 5.9 12/14/2018       Assessment & Plan:   Problem List Items Addressed This Visit    Hypertension  Well controlled, no changes to meds. Encouraged heart healthy diet such as the DASH diet and exercise as tolerated.       Relevant Orders   CBC (Completed)   Comprehensive metabolic panel (Completed)   TSH (Completed)   T4, free (Completed)   Thyroid peroxidase antibody (Completed)   Hyperlipidemia    Encouraged heart healthy diet, increase exercise, avoid trans fats, consider a krill oil cap daily      Obesity    Encouraged DASH diet, decrease po intake and increase exercise as tolerated. Needs 7-8 hours of sleep nightly. Avoid trans fats, eat small, frequent meals every 4-5 hours with lean proteins, complex carbs and healthy fats. Minimize simple carbs, consider weight watchers app      Hyperparathyroidism, primary ([place])    Check labs       Relevant  Orders   VITAMIN D 25 Hydroxy (Vit-D Deficiency, Fractures) (Completed)   T4, free (Completed)   Thyroid peroxidase antibody (Completed)   Hyperglycemia    hgba1c acceptable, minimize simple carbs. Increase exercise as tolerated. Continue current meds      Thyroiditis    Free T4 suppressed and thyroid peroxidase antibody elevated. Referred to endocrinology for further evaluation.       Snoring    Is following with neurology at [facility]. They have released her for TIAs but they are mailing her home sleep study      Arthralgia - Primary    RF and ANA are normal. She does endorse stiffness and pain most notable in am c/w OA. Encouraged to stay as active as able and use tylenol and topical treatments prn      Relevant Orders   Sedimentation rate (Completed)   Rheumatoid Factor (Completed)   ANA      I am having Sandra [name] maintain her Cholecalciferol (VITAMIN D PO), OVER THE COUNTER MEDICATION, aspirin EC, lactobacillus acidophilus, Co-Enzyme Q-10, DOCOSAHEXAENOIC ACID PO, omeprazole, atorvastatin, furosemide, irbesartan, verapamil, and metoprolol succinate.  No orders of the defined types were placed in this encounter.    Penni Homans, MD

## 2019-05-05 LAB — ANTI-NUCLEAR AB-TITER (ANA TITER): ANA Titer 1: 1:40 {titer} — ABNORMAL HIGH

## 2019-05-05 LAB — RHEUMATOID FACTOR: Rheumatoid fact SerPl-aCnc: <14 IU/mL (ref <14)

## 2019-05-05 LAB — ANA: Anti Nuclear Antibody (ANA): POSITIVE — AB

## 2019-05-05 LAB — THYROID PEROXIDASE ANTIBODY: Thyroperoxidase Ab SerPl-aCnc: 118 IU/mL — ABNORMAL HIGH (ref <9)

## 2019-05-06 ENCOUNTER — Other Ambulatory Visit: Payer: Self-pay | Admitting: Family Medicine

## 2019-05-06 ENCOUNTER — Telehealth: Payer: Self-pay

## 2019-05-06 DIAGNOSIS — R768 Other specified abnormal immunological findings in serum: Secondary | ICD-10-CM

## 2019-05-06 DIAGNOSIS — M2550 Pain in unspecified joint: Secondary | ICD-10-CM

## 2019-05-06 NOTE — Telephone Encounter (Signed)
Please change her endocrinology referral to Dr Buddy Duty at patient request and I will place the Rheumatology referral

## 2019-05-06 NOTE — Telephone Encounter (Signed)
-----   Message from [name], MD sent at 05/05/2019 12:21 PM EST ----- Her ANA is also positive so we could refer to rheumatology for evaluation of possible lupus or rheumatologic disorder. See if she is willing and we can place referral

## 2019-05-06 NOTE — Telephone Encounter (Signed)
SB-Pt said that she has seen Dr. Buddy Duty Endocrinology in the past and would like to be referred to see him for thyroid  Is also willing to see a Rheumatologist and would be ok with anywhere in [place], [place], or [place] that you prefer/plz advise/thx dmf

## 2019-05-06 NOTE — Telephone Encounter (Signed)
Changed Endocrinology referral to Dr. Buddy Duty at [facility]/thx dmf

## 2019-05-07 ENCOUNTER — Other Ambulatory Visit: Payer: Self-pay | Admitting: Family Medicine

## 2019-05-13 ENCOUNTER — Encounter: Payer: Self-pay | Admitting: Pulmonary Disease

## 2019-05-23 ENCOUNTER — Telehealth: Payer: Self-pay | Admitting: *Deleted

## 2019-05-23 DIAGNOSIS — G4730 Sleep apnea, unspecified: Secondary | ICD-10-CM

## 2019-05-23 NOTE — Telephone Encounter (Signed)
Received sleep study results.  Per Dr. Charlett Blake positive for sleep apnea, needs referral to pulmonology.  Patient notified and referral placed.

## 2019-06-02 ENCOUNTER — Other Ambulatory Visit: Payer: Self-pay | Admitting: Family Medicine

## 2019-06-29 ENCOUNTER — Other Ambulatory Visit: Payer: Self-pay | Admitting: Family Medicine

## 2019-07-18 NOTE — Progress Notes (Signed)
Office Visit Note  Patient: Sandra [name]             Date of Birth: 1961/01/23           MRN: [id number]             PCP: [name], MD Referring: [name], MD Visit Date: 07/26/2019 Occupation: _0 @  Subjective:  Pain in multiple joints.   History of Present Illness: Sandra SHRIEVES is a 59 y.o. female seen in consultation per request of her PCP.  According to the patient her right shoulder pain is started in January 2020 for which she had cortisone injections few times.  Eventually the cortisone injections quit working and she was evaluated by Dr. Ivin Booty, orthopedic surgeon at [facility].  She underwent total shoulder replacement in November 2020.  She had good recovery from that.  She states she has been having pain and discomfort in her bilateral hands and her bilateral feet for the last 2 years almost.  She has noticed swelling in her hands and feet as well.  She describes swelling mostly in her MCPs and on the dorsum of her foot feet.  She also has been experiencing pain in her bilateral shoulders and her bilateral knee joints for the last 6 months.  She has noticed discomfort in her ankles.  None of the other joints are painful.  She was experiencing fatigue and hair loss which has been improved since she has been on Synthroid.  There is positive family history of lupus and rheumatoid arthritis in her knees.  There is no history of psoriasis or family history of psoriasis.  She is gravida 3, para 2, miscarriage 1.  Activities of Daily Living:  Patient reports morning stiffness for 24 hours.   Patient Reports nocturnal pain.  Difficulty dressing/grooming: Denies Difficulty climbing stairs: Reports Difficulty getting out of chair: Reports Difficulty using hands for taps, buttons, cutlery, and/or writing: Reports  Review of Systems  Constitutional: Positive for fatigue. Negative for night sweats, weight gain and weight loss.  HENT: Positive for  mouth dryness. Negative for mouth sores, trouble swallowing, trouble swallowing and nose dryness.   Eyes: Positive for dryness. Negative for pain, redness, itching and visual disturbance.  Respiratory: Positive for cough. Negative for shortness of breath and difficulty breathing.        Dry cough  Cardiovascular: Negative for chest pain, palpitations, hypertension, irregular heartbeat and swelling in legs/feet.  Gastrointestinal: Negative for blood in stool, constipation and diarrhea.  Endocrine: Negative for increased urination.  Genitourinary: Negative for difficulty urinating and vaginal dryness.  Musculoskeletal: Positive for arthralgias, joint pain, joint swelling and morning stiffness. Negative for myalgias, muscle weakness, muscle tenderness and myalgias.  Skin: Positive for hair loss. Negative for color change, rash, redness, skin tightness, ulcers and sensitivity to sunlight.  Allergic/Immunologic: Negative for susceptible to infections.  Neurological: Negative for dizziness, numbness, headaches, memory loss, night sweats and weakness.  Hematological: Negative for bruising/bleeding tendency and swollen glands.  Psychiatric/Behavioral: Positive for sleep disturbance. Negative for depressed mood and confusion. The patient is not nervous/anxious.     PMFS History:  Patient Active Problem List   Diagnosis Date Noted  . Arthralgia 05/04/2019  . Snoring 05/03/2019  . Thyroiditis 12/20/2018  . Muscle cramp 12/20/2018  . Eustachian tube dysfunction 07/27/2017  . Headache 11/25/2015  . Acute bacterial sinusitis 03/26/2015  . Hematuria 04/09/2014  . Hyperglycemia 04/06/2014  . Sinusitis, acute maxillary 01/31/2014  . Diarrhea 09/21/2013  .  Hyperparathyroidism, primary ([place]) 07/12/2013  . Dyspnea 12/28/2012  . Hypercalcemia 12/16/2012  . H/O tobacco use, presenting hazards to health 11/29/2012  . Overactive bladder 05/31/2012  . Elevated LFTs 05/31/2012  . Obesity 05/31/2012  .  Allergic rhinitis 05/31/2012  . Hoarseness 02/04/2012  . Preventative health care 02/04/2012  . SUI (stress urinary incontinence, female) 02/04/2012  . Migraine   . GERD (gastroesophageal reflux disease)   . Rotator cuff syndrome of right shoulder 06/11/2011  . Atypical chest pain 01/03/2011  . Hypertension 01/03/2011  . Hyperlipidemia 01/03/2011  . KNEE PAIN, BILATERAL 09/28/2008  . ANSERINE BURSITIS 09/28/2008  . 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 Allergic rhinitis 05/31/2012  . Chest pain    PT SAW CARDIOLOGIST DR. Burt Knack - STRESS ECHO DONE NEGATIVE - NO PROBLEM SINCE  . Chicken pox as achild  . Cough 11/29/2012   RESOLVED - IT WAS CAUSED BY LISINOPRIL  . Elevated LFTs 05/31/2012  . Eustachian tube dysfunction 07/27/2017  . GERD (gastroesophageal reflux disease)   . H/O tobacco use, presenting hazards to health 11/29/2012   Smoked 1 1/2 ppd for 20 years quit in roughtly 2007  . Headache 11/25/2015  . Hematuria 04/09/2014  . Hoarseness 02/04/2012   RESOLVED - THOUGHT TO HAVE BEEN ALLERY RELATED  . Hyperglycemia 04/06/2014  . Hyperlipidemia   . Hyperparathyroidism ([place])   . Hypertension   . Obesity, unspecified 05/31/2012  . Ocular migraine   . Overactive bladder 05/31/2012  . Preventative health care 02/04/2012  . Rapid heart beat    PT FEELS PROB RELATED TO HYPER PARATHYROIDISM  . SUI (stress urinary incontinence, female) 02/04/2012   Sees Dr Perlie Gold and they have discussed a bladder tack but so far she declines    Family History  Problem Relation Age of Onset  . Hypertension Mother 49       alive  . Renal cancer Mother        renal  . Hyperlipidemia Mother   . Osteoporosis Mother        humerus   . Macular degeneration Mother   . Tremor Mother   . Parkinson's disease Mother   . Hypertension Father 76       alive  . Atrial fibrillation Father   . Colon cancer Father        colon  . Hyperlipidemia Father   .  Stroke Father   . Alzheimer's disease Father   . Hypertension Brother 35       alive  . Tremor Brother   . Hypertension Sister   . Lung cancer Maternal Grandfather        lung/ smoker  . Emphysema Maternal Grandfather   . Heart disease Paternal Grandmother   . Other Paternal Grandfather        black lung  . Heart disease Paternal Grandfather   . Asthma Son   . Eczema Son   . Healthy Son   . Healthy Son   . Heart disease Paternal Aunt   . Heart disease Paternal Uncle    Past Surgical History:  Procedure Laterality Date  . BREAST EXCISIONAL BIOPSY Left    Age 62 Fibroadenoma  . BREAST SURGERY     left breast excision of adenoma  . btl    . PARATHYROIDECTOMY Right 08/12/2013   Procedure: right inferior frozen section PARATHYROIDECTOMY;  Surgeon: Earnstine Regal, MD;  Location: [facility];  Service: General;  Laterality: Right;  .  PELVIC LAPAROSCOPY  03-2004  . TOTAL SHOULDER REPLACEMENT Right 01/2019  . TUBAL LIGATION    . WISDOM TOOTH EXTRACTION  30 yrs ago   Social History   Social History Narrative  . Not on file   Immunization History  Administered Date(s) Administered  . Hepatitis A, Adult 09/04/2016, 03/27/2017  . Influenza Split 12/09/2011  . Influenza,inj,Quad PF,6+ Mos 11/29/2012, 11/01/2013, 11/20/2015, 12/29/2017, 04/03/2019  . Tdap 06/07/2015     Objective: Vital Signs: BP (!) 182/108 (BP Location: Right Arm, Patient Position: Sitting, Cuff Size: Normal)   Pulse 81   Ht 5' 4" (1.626 m)   Wt 209 lb (94.8 kg)   LMP 04/01/2011   BMI 35.87 kg/m    Physical Exam Vitals and nursing note reviewed.  Constitutional:      Appearance: She is well-developed.  HENT:     Head: Normocephalic and atraumatic.  Eyes:     Conjunctiva/sclera: Conjunctivae normal.  Cardiovascular:     Rate and Rhythm: Normal rate and regular rhythm.     Heart sounds: Normal heart sounds.  Pulmonary:     Effort: Pulmonary effort is normal.     Breath sounds: Normal breath sounds.    Abdominal:     General: Bowel sounds are normal.     Palpations: Abdomen is soft.  Musculoskeletal:     Cervical back: Normal range of motion.  Lymphadenopathy:     Cervical: No cervical adenopathy.  Skin:    General: Skin is warm and dry.     Capillary Refill: Capillary refill takes less than 2 seconds.  Neurological:     Mental Status: She is alert and oriented to person, place, and time.  Psychiatric:        Behavior: Behavior normal.      Musculoskeletal Exam: C-spine thoracic and lumbar spine were in good range of motion.  Shoulder joints were in good range of motion.  Her right shoulder joint is replaced.  She had some discomfort range of motion of the shoulder joints.  Elbow joints and wrist joints with good range of motion.  She has synovitis of her bilateral second and third MCP joints.  PIP and DIP thickening was noted.  She had good range of motion of bilateral hip joints.  She has warmth in bilateral knee joints and swelling.  She had effusion in her left knee joint.  She had swelling over bilateral ankle joints.  She is swelling over the dorsum of her right foot.  CDAI Exam: CDAI Score: 15.6  Patient Global: 8 mm; Provider Global: 8 mm Swollen: 9 ; Tender: 11  Joint Exam 07/26/2019      Right  Left  Glenohumeral   Tender   Tender  MCP 2  Swollen Tender  Swollen Tender  MCP 3  Swollen Tender  Swollen Tender  Knee  Swollen Tender  Swollen Tender  Ankle  Swollen Tender  Swollen Tender  Tarsometatarsal  Swollen Tender        Investigation: No additional findings.  Imaging: XR Foot 2 Views Left  Result Date: 07/26/2019 Juxta-articular osteopenia was noted.  Third MTP severe narrowing with possible early erosive changes were noted.  PIP and DIP narrowing was noted.  Metatarsal tarsal narrowing and spurring was noted.  No tibiotalar or subtalar joint space narrowing was noted.  Inferior calcaneal spur was noted. Impression: These findings are consistent with  inflammatory arthritis and osteoarthritis overlap.  XR Foot 2 Views Right  Result Date: 07/26/2019 Juxta-articular osteopenia was noted.  Third MTP narrowing, PIP and DIP narrowing was noted.  No intertarsal or tibiotalar joint space narrowing was noted.  A small calcaneal spur was noted. Impression: These findings are consistent with inflammatory arthritis and osteoarthritis overlap.  XR Hand 2 View Left  Result Date: 07/26/2019 Juxta-articular osteopenia was noted.  Narrowing of all MCP joints was noted.  Most severe narrowing was noted in the third MCP joint.  PIP and DIP narrowing was noted.  No intercarpal or radiocarpal joint space narrowing was noted.  No erosive changes were noted. Impression: These findings are consistent with inflammatory arthritis most likely rheumatoid arthritis.  XR Hand 2 View Right  Result Date: 07/26/2019 Juxta-articular osteopenia was noted.  Narrowing of all MCP joints was noted.  Most severe narrowing was noted in the third MCP joint.  PIP and DIP narrowing was noted.  No intercarpal or radiocarpal joint space narrowing was noted.  No erosive changes were noted. Impression: These findings are consistent with inflammatory arthritis most likely rheumatoid arthritis.  XR KNEE 3 VIEW LEFT  Result Date: 07/26/2019 Moderate medial compartment narrowing with the medial osteophytes was noted.  Moderate patellofemoral narrowing was noted.  Effusion was noted.  No chondrocalcinosis was noted. Impression: These findings are consistent with moderate osteoarthritis and moderate chondromalacia patella.  XR KNEE 3 VIEW RIGHT  Result Date: 07/26/2019 Moderate medial compartment narrowing with the medial osteophytes was noted.  Moderate patellofemoral narrowing was noted.  No chondrocalcinosis was noted. Impression: These findings are consistent with moderate osteoarthritis and moderate chondromalacia patella.   Recent Labs: Lab Results  Component Value Date   WBC 6.5  05/03/2019   HGB 13.1 05/03/2019   PLT 284.0 05/03/2019   NA 139 05/03/2019   K 4.1 05/03/2019   CL 101 05/03/2019   CO2 29 05/03/2019   GLUCOSE 95 05/03/2019   BUN 17 05/03/2019   CREATININE 0.61 05/03/2019   BILITOT 0.4 05/03/2019   ALKPHOS 110 05/03/2019   AST 21 05/03/2019   ALT 28 05/03/2019   PROT 7.2 05/03/2019   ALBUMIN 4.6 05/03/2019   CALCIUM 10.0 05/03/2019   GFRAA >90 08/04/2013    Speciality Comments: No specialty comments available.  Procedures:  No procedures performed Allergies: Cefdinir, Doxycycline, Codeine, Penicillins, and Simvastatin   Assessment / Plan:     Visit Diagnoses: Chronic inflammatory arthritis -she has severe inflammatory arthritis involving multiple joints.  Most likely rheumatoid arthritis.  Rheumatoid factor was negative.  Clinical and radiographic findings are consistent with rheumatoid arthritis.  Will obtain some additional antibodies.  My plan was to place her on prednisone taper but her blood pressure is very high.  Patient states that she did not take any of her blood pressure medications last night.  She mentioned that when she takes her blood pressure medications her blood pressure is very well controlled.  I have advised her to monitor blood pressure for the next couple of days if it stays within normal range then she can start a prednisone taper.  We will call in prednisone taper starting at 20 mg and taper by 5 mg every week.  She has been advised to see her PCP for control of her blood pressure.  We also discussed possible use of methotrexate.  Indications side effects contraindications were discussed.  A handout was given and consent was taken.  We plan to start her on the medication at the follow-up visit.  Plan: Cyclic citrul peptide antibody, IgG, 14-3-3 eta Protein  Pain in both hands -she has  pain and swelling in her bilateral MCPs.  Plan: XR Hand 2 View Left, XR Hand 2 View Right.  Juxta-articular osteopenia and MCP joint narrowing  was noted.  Chronic pain of both knees -she has warmth swelling and effusion in her knee joints as described above.  Plan: XR KNEE 3 VIEW RIGHT, XR KNEE 3 VIEW LEFT.  Bilateral moderate osteoarthritis and moderate chondromalacia patella was noted.  Pain in both feet -she has pain in her bilateral feet.  She is swelling on the dorsum of her foot and bilateral ankles.  Plan: XR Foot 2 Views Left, XR Foot 2 Views Right.  Juxta-articular osteopenia and joint space narrowing consistent with inflammatory arthritis was noted.  High risk medication use -I'll obtain following labs in anticipation to start her on the treatment.  Plan: CBC with Differential/Platelet, COMPLETE METABOLIC PANEL WITH GFR, Hepatitis B core antibody, IgM, Hepatitis B surface antigen, Hepatitis C antibody, HIV Antibody (routine testing w rflx), QuantiFERON-TB Gold Plus, Serum protein electrophoresis with reflex, IgG, IgA, IgM, DG Chest 2 View  Positive ANA (antinuclear antibody) - 05/03/19: ANA 1:40 cytoplasmic, Thyroperoxidase ab 118, T4 0.53, TSH 2.59, ESR 13, RF<14, Vitamin D 45.68 -she gives history of sicca symptoms and also a dry cough.  She has history of miscarriage in the past.  I'll obtain following labs today.  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 Ivin Booty, [place].  Due to osteoarthritis.  She is doing well and had good range of motion.  Other fatigue - Plan: CK  Essential hypertension-blood pressure is very high.  Patient was advised to see her PCP today for control of her blood pressure.  Hashimoto's thyroiditis - Followed by Dr. Buddy Duty  Hyperparathyroidism, primary  [facility]) - status post resection of parathyroid tumor per patient 2014.  Hx of migraines - ocular  History of gastroesophageal reflux (GERD)  History of hyperlipidemia  History of miscarriage - G3P2M1 first trimester  Former smoker-patient complains of a dry cough.  She has an appointment with Dr. Elsworth Soho today.  Orders: Orders Placed This Encounter  Procedures  . XR KNEE 3 VIEW RIGHT  . XR KNEE 3 VIEW LEFT  . XR Hand 2 View Left  . XR Hand 2 View Right  . XR Foot 2 Views Left  . XR Foot 2 Views Right  . DG Chest 2 View  . CBC with Differential/Platelet  . COMPLETE METABOLIC PANEL WITH GFR  . CK  . Anti-scleroderma antibody  . RNP Antibody  . Anti-Smith antibody  . Sjogrens syndrome-A extractable nuclear antibody  . Sjogrens syndrome-B extractable nuclear antibody  . Anti-DNA antibody, double-stranded  . C3 and C4  . Beta-2 glycoprotein antibodies  . Cardiolipin antibodies, IgG, IgM, IgA  . Lupus Anticoagulant Eval w/Reflex  . Cyclic citrul peptide antibody, IgG  . 14-3-3 eta Protein  . Hepatitis B core antibody, IgM  . Hepatitis B surface antigen  . Hepatitis C antibody  . HIV Antibody (routine testing w rflx)  . QuantiFERON-TB Gold Plus  . Serum protein electrophoresis with reflex  . IgG, IgA, IgM   No orders of the defined types were placed in this encounter.   Face-to-face time spent with patient was 60 minutes. Greater than 50% of time was spent in counseling and coordination  of care.  Follow-Up Instructions: Return for Chronic inflammatory arthritis.   Bo Merino, MD  Note - This record has been created using Editor, commissioning.  Chart creation errors have been sought, but may not always  have been located. Such creation errors do not reflect on  the standard of medical care.

## 2019-07-20 ENCOUNTER — Other Ambulatory Visit: Payer: Self-pay | Admitting: Family Medicine

## 2019-07-26 ENCOUNTER — Encounter: Payer: Self-pay | Admitting: Pulmonary Disease

## 2019-07-26 ENCOUNTER — Ambulatory Visit: Payer: 59 | Admitting: Rheumatology

## 2019-07-26 ENCOUNTER — Ambulatory Visit: Payer: 59 | Admitting: Family Medicine

## 2019-07-26 ENCOUNTER — Encounter: Payer: Self-pay | Admitting: Rheumatology

## 2019-07-26 ENCOUNTER — Ambulatory Visit: Payer: Self-pay

## 2019-07-26 ENCOUNTER — Ambulatory Visit (INDEPENDENT_AMBULATORY_CARE_PROVIDER_SITE_OTHER): Payer: 59

## 2019-07-26 ENCOUNTER — Ambulatory Visit (INDEPENDENT_AMBULATORY_CARE_PROVIDER_SITE_OTHER): Payer: 59 | Admitting: Pulmonary Disease

## 2019-07-26 ENCOUNTER — Other Ambulatory Visit: Payer: Self-pay

## 2019-07-26 ENCOUNTER — Ambulatory Visit (INDEPENDENT_AMBULATORY_CARE_PROVIDER_SITE_OTHER): Payer: 59 | Admitting: Rheumatology

## 2019-07-26 VITALS — BP 182/108 | HR 81 | Ht 64.0 in | Wt 209.0 lb

## 2019-07-26 VITALS — BP 138/88 | HR 86 | Temp 98.0°F | Ht 64.0 in | Wt 210.0 lb

## 2019-07-26 DIAGNOSIS — M1990 Unspecified osteoarthritis, unspecified site: Secondary | ICD-10-CM | POA: Diagnosis not present

## 2019-07-26 DIAGNOSIS — M25561 Pain in right knee: Secondary | ICD-10-CM

## 2019-07-26 DIAGNOSIS — R768 Other specified abnormal immunological findings in serum: Secondary | ICD-10-CM

## 2019-07-26 DIAGNOSIS — M79672 Pain in left foot: Secondary | ICD-10-CM

## 2019-07-26 DIAGNOSIS — M79641 Pain in right hand: Secondary | ICD-10-CM | POA: Diagnosis not present

## 2019-07-26 DIAGNOSIS — J849 Interstitial pulmonary disease, unspecified: Secondary | ICD-10-CM | POA: Diagnosis not present

## 2019-07-26 DIAGNOSIS — G4733 Obstructive sleep apnea (adult) (pediatric): Secondary | ICD-10-CM | POA: Diagnosis not present

## 2019-07-26 DIAGNOSIS — I10 Essential (primary) hypertension: Secondary | ICD-10-CM

## 2019-07-26 DIAGNOSIS — G8929 Other chronic pain: Secondary | ICD-10-CM | POA: Diagnosis not present

## 2019-07-26 DIAGNOSIS — R5383 Other fatigue: Secondary | ICD-10-CM

## 2019-07-26 DIAGNOSIS — R0602 Shortness of breath: Secondary | ICD-10-CM | POA: Diagnosis not present

## 2019-07-26 DIAGNOSIS — Z79899 Other long term (current) drug therapy: Secondary | ICD-10-CM

## 2019-07-26 DIAGNOSIS — M79642 Pain in left hand: Secondary | ICD-10-CM | POA: Diagnosis not present

## 2019-07-26 DIAGNOSIS — Z96611 Presence of right artificial shoulder joint: Secondary | ICD-10-CM

## 2019-07-26 DIAGNOSIS — M25562 Pain in left knee: Secondary | ICD-10-CM | POA: Diagnosis not present

## 2019-07-26 DIAGNOSIS — M2550 Pain in unspecified joint: Secondary | ICD-10-CM

## 2019-07-26 DIAGNOSIS — Z8639 Personal history of other endocrine, nutritional and metabolic disease: Secondary | ICD-10-CM

## 2019-07-26 DIAGNOSIS — M79671 Pain in right foot: Secondary | ICD-10-CM

## 2019-07-26 DIAGNOSIS — Z8669 Personal history of other diseases of the nervous system and sense organs: Secondary | ICD-10-CM

## 2019-07-26 DIAGNOSIS — Z8719 Personal history of other diseases of the digestive system: Secondary | ICD-10-CM

## 2019-07-26 DIAGNOSIS — E210 Primary hyperparathyroidism: Secondary | ICD-10-CM

## 2019-07-26 DIAGNOSIS — Z8759 Personal history of other complications of pregnancy, childbirth and the puerperium: Secondary | ICD-10-CM

## 2019-07-26 DIAGNOSIS — Z87891 Personal history of nicotine dependence: Secondary | ICD-10-CM

## 2019-07-26 DIAGNOSIS — E063 Autoimmune thyroiditis: Secondary | ICD-10-CM

## 2019-07-26 DIAGNOSIS — M75101 Unspecified rotator cuff tear or rupture of right shoulder, not specified as traumatic: Secondary | ICD-10-CM

## 2019-07-26 MED ORDER — PREDNISONE 5 MG PO TABS: ORAL_TABLET | ORAL | 0 refills | Status: DC

## 2019-07-26 NOTE — Progress Notes (Signed)
Subjective:    Patient ID: Sandra [name], female    DOB: 1960/05/24, 59 y.o.   MRN: [id number]  HPI 59 year old ex-smoker referred for evaluation of shortness of breath and OSA.  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  She subsequently has developed Hashimoto's thyroiditis and is on thyroid supplementation. She is being evaluated by rheumatology for seronegative inflammatory arthritis which is felt to be rheumatoid arthritis clinically, prednisone short course has been prescribed and methotrexate is being considered.  She underwent evaluation by neurology at [facility] for TIA presenting as hemianopia and subsequently home sleep test was performed which apparently showed moderate OSA.  Results of the study are not available for me at the time of dictation.  CPAP titration study has been ordered on 6/6 I have reviewed all the above records from above consultants  Epworth sleepiness score is 4 She reports nonrefreshing sleep excessive tiredness during the day, loud snoring has been noted by family members for many years Bedtime is between 81 and 10 PM, sleep latency is about an hour, she sleeps on her side with 2 pillows, reports 2-3 nocturnal awakenings and is out of bed by 6 AM feeling tired with dryness of mouth.  She is gained about 20 pounds in the last 2 years. There is no history suggestive of cataplexy, sleep paralysis or parasomnias  She reports a dry cough for about a year, dyspnea on exertion has been ongoing for about a year and has progressed to class II to class III symptoms. She is a remote smoker about half pack per day for 25 years until she quit in 2007 She denies wheezing or childhood history of asthma.  There is no history of recurrent chest colds  Chest x-ray from 2014 was personally reviewed and appears clear Chest x-ray obtained today shows mild  interstitial prominence  On ambulation, there was no desaturation, heart rate increased from 97-1 21   Significant tests/ events reviewed  2014 -Spirometry no airway obstruction, FEV1 2.37-85% and ratio of 83.  Past Medical History:  Diagnosis Date  . Allergic rhinitis 05/31/2012  . Chest pain    PT SAW CARDIOLOGIST DR. Burt Knack - STRESS ECHO DONE NEGATIVE - NO PROBLEM SINCE  . Chicken pox as achild  . Cough 11/29/2012   RESOLVED - IT WAS CAUSED BY LISINOPRIL  . Elevated LFTs 05/31/2012  . Eustachian tube dysfunction 07/27/2017  . GERD (gastroesophageal reflux disease)   . H/O tobacco use, presenting hazards to health 11/29/2012   Smoked 1 1/2 ppd for 20 years quit in roughtly 2007  . Headache 11/25/2015  . Hematuria 04/09/2014  . Hoarseness 02/04/2012   RESOLVED - THOUGHT TO HAVE BEEN ALLERY RELATED  . Hyperglycemia 04/06/2014  . Hyperlipidemia   . Hyperparathyroidism ([place])   . Hypertension   . Obesity, unspecified 05/31/2012  . Ocular migraine   . Overactive bladder 05/31/2012  . Preventative health care 02/04/2012  . Rapid heart beat    PT FEELS PROB RELATED TO HYPER PARATHYROIDISM  . SUI (stress urinary incontinence, female) 02/04/2012   Sees Dr Perlie Gold and they have discussed a bladder tack but so far she declines    Past Surgical History:  Procedure Laterality Date  . BREAST EXCISIONAL BIOPSY Left    Age 72 Fibroadenoma  . BREAST SURGERY     left breast excision of adenoma  . btl    .  PARATHYROIDECTOMY Right 08/12/2013   Procedure: right inferior frozen section PARATHYROIDECTOMY;  Surgeon: Earnstine Regal, MD;  Location: [facility];  Service: General;  Laterality: Right;  . PELVIC LAPAROSCOPY  03-2004  . TOTAL SHOULDER REPLACEMENT Right 01/2019  . TUBAL LIGATION    . WISDOM TOOTH EXTRACTION  30 yrs ago   Allergies  Allergen Reactions  . Cefdinir Other (See Comments)    Severe Stomach cramps and diarrhea for several days.  . Doxycycline Diarrhea  . Codeine Nausea And  Vomiting  . Penicillins     RASH, ITCHING  . Simvastatin     myalgias  .soc  Family History  Problem Relation Age of Onset  . Hypertension Mother 23       alive  . Renal cancer Mother        renal  . Hyperlipidemia Mother   . Osteoporosis Mother        humerus   . Macular degeneration Mother   . Tremor Mother   . Parkinson's disease Mother   . Hypertension Father 46       alive  . Atrial fibrillation Father   . Colon cancer Father        colon  . Hyperlipidemia Father   . Stroke Father   . Alzheimer's disease Father   . Hypertension Brother 91       alive  . Tremor Brother   . Hypertension Sister   . Lung cancer Maternal Grandfather        lung/ smoker  . Emphysema Maternal Grandfather   . Heart disease Paternal Grandmother   . Other Paternal Grandfather        black lung  . Heart disease Paternal Grandfather   . Asthma Son   . Eczema Son   . Healthy Son   . Healthy Son   . Heart disease Paternal Aunt   . Heart disease Paternal Uncle       Review of Systems Constitutional: negative for anorexia, fevers and sweats  Eyes: negative for irritation, redness and visual disturbance  Ears, nose, mouth, throat, and face: negative for earaches, epistaxis, nasal congestion and sore throat  Respiratory: negative for cough,sputum and wheezing  Cardiovascular: negative for chest pain,  lower extremity edema, orthopnea, palpitations and syncope  Gastrointestinal: negative for abdominal pain, constipation, diarrhea, melena, nausea and vomiting  Genitourinary:negative for dysuria, frequency and hematuria  Hematologic/lymphatic: negative for bleeding, easy bruising and lymphadenopathy  Neurological: negative for coordination problems, gait problems, headaches and weakness  Endocrine: negative for diabetic symptoms including polydipsia, polyuria and weight loss     Objective:   Physical Exam  Gen. Pleasant, obese, in no distress, normal affect ENT - no pallor,icterus, no  post nasal drip, class 2-3 airway Neck: No JVD, no thyromegaly, no carotid bruits Lungs: no use of accessory muscles, no dullness to percussion, right basal dry crackles without rhonchi  Cardiovascular: Rhythm regular, heart sounds  normal, no murmurs or gallops, no peripheral edema Abdomen: soft and non-tender, no hepatosplenomegaly, BS normal. Musculoskeletal: No deformities, no cyanosis or clubbing Neuro:  alert, non focal, no tremors       Assessment & Plan:

## 2019-07-26 NOTE — Patient Instructions (Addendum)
Standing Labs We placed an order today for your standing lab work.    Please come back and get your standing labs in 2 weeks, 4 weeks, 8 weeks, then every 3 months.  We have open lab daily Monday through Thursday from 8:30-12:30 PM and 1:30-4:30 PM and Friday from 8:30-12:30 PM and 1:30-4:00 PM at the office of Dr. Bo Merino.   You may experience shorter wait times on Monday and Friday afternoons. The office is located at [address] [place], [place], [place] [address] No appointment is necessary.   Labs are drawn by [facility].  You may receive a bill from [facility] for your lab work.  If you wish to have your labs drawn at another location, please call the office 24 hours in advance to send orders.  If you have any questions regarding directions or hours of operation,  please call [phone].   Just as a reminder please drink plenty of water prior to coming for your lab work. Thanks!  Vaccines You are taking a medication(s) that can suppress your immune system.  The following immunizations are recommended: . Flu annually . Covid-19 . Pneumonia (Pneumovax 23 and Prevnar 13 spaced at least 1 year apart) . Shingrix (after age 22)  Please check with your PCP to make sure you are up to date.    Methotrexate tablets What is this medicine? METHOTREXATE (METH oh TREX ate) is a chemotherapy drug used to treat cancer including breast cancer, leukemia, and lymphoma. This medicine can also be used to treat psoriasis and certain kinds of arthritis. This medicine may be used for other purposes; ask your health care provider or pharmacist if you have questions. COMMON BRAND NAME(S): Rheumatrex, Trexall What should I tell my health care provider before I take this medicine? They need to know if you have any of these conditions:  fluid in the stomach area or lungs  if you often drink alcohol  infection or immune system problems  kidney disease or on hemodialysis  liver  disease  low blood counts, like low white cell, platelet, or red cell counts  lung disease  radiation therapy  stomach ulcers  ulcerative colitis  an unusual or allergic reaction to methotrexate, other medicines, foods, dyes, or preservatives  pregnant or trying to get pregnant  breast-feeding How should I use this medicine? Take this medicine by mouth with a glass of water. Follow the directions on the prescription label. Take your medicine at regular intervals. Do not take it more often than directed. Do not stop taking except on your doctor's advice. Make sure you know why you are taking this medicine and how often you should take it. If this medicine is used for a condition that is not cancer, like arthritis or psoriasis, it should be taken weekly, NOT daily. Taking this medicine more often than directed can cause serious side effects, even death. Talk to your healthcare provider about safe handling and disposal of this medicine. You may need to take special precautions. Talk to your pediatrician regarding the use of this medicine in children. While this drug may be prescribed for selected conditions, precautions do apply. Overdosage: If you think you have taken too much of this medicine contact a poison control center or emergency room at once. NOTE: This medicine is only for you. Do not share this medicine with others. What if I miss a dose? If you miss a dose, talk with your doctor or health care professional. Do not take double or extra  doses. What may interact with this medicine? This medicine may interact with the following medication:  acitretin  aspirin and aspirin-like medicines including salicylates  azathioprine  certain antibiotics like penicillins, tetracycline, and chloramphenicol  cyclosporine  gold  hydroxychloroquine  live virus vaccines  NSAIDs, medicines for pain and inflammation, like ibuprofen or naproxen  other cytotoxic  agents  penicillamine  phenylbutazone  phenytoin  probenecid  retinoids such as isotretinoin and tretinoin  steroid medicines like prednisone or cortisone  sulfonamides like sulfasalazine and trimethoprim/sulfamethoxazole  theophylline This list may not describe all possible interactions. Give your health care provider a list of all the medicines, herbs, non-prescription drugs, or dietary supplements you use. Also tell them if you smoke, drink alcohol, or use illegal drugs. Some items may interact with your medicine. What should I watch for while using this medicine? Avoid alcoholic drinks. This medicine can make you more sensitive to the sun. Keep out of the sun. If you cannot avoid being in the sun, wear protective clothing and use sunscreen. Do not use sun lamps or tanning beds/booths. You may need blood work done while you are taking this medicine. Call your doctor or health care professional for advice if you get a fever, chills or sore throat, or other symptoms of a cold or flu. Do not treat yourself. This drug decreases your body's ability to fight infections. Try to avoid being around people who are sick. This medicine may increase your risk to bruise or bleed. Call your doctor or health care professional if you notice any unusual bleeding. Check with your doctor or health care professional if you get an attack of severe diarrhea, nausea and vomiting, or if you sweat a lot. The loss of too much body fluid can make it dangerous for you to take this medicine. Talk to your doctor about your risk of cancer. You may be more at risk for certain types of cancers if you take this medicine. Both men and women must use effective birth control with this medicine. Do not become pregnant while taking this medicine or until at least 1 normal menstrual cycle has occurred after stopping it. Women should inform their doctor if they wish to become pregnant or think they might be pregnant. Men should  not father a child while taking this medicine and for 3 months after stopping it. There is a potential for serious side effects to an unborn child. Talk to your health care professional or pharmacist for more information. Do not breast-feed an infant while taking this medicine. What side effects may I notice from receiving this medicine? Side effects that you should report to your doctor or health care professional as soon as possible:  allergic reactions like skin rash, itching or hives, swelling of the face, lips, or tongue  breathing problems or shortness of breath  diarrhea  dry, nonproductive cough  low blood counts - this medicine may decrease the number of white blood cells, red blood cells and platelets. You may be at increased risk for infections and bleeding.  mouth sores  redness, blistering, peeling or loosening of the skin, including inside the mouth  signs of infection - fever or chills, cough, sore throat, pain or trouble passing urine  signs and symptoms of bleeding such as bloody or black, tarry stools; red or dark-brown urine; spitting up blood or brown material that looks like coffee grounds; red spots on the skin; unusual bruising or bleeding from the eye, gums, or nose  signs and symptoms  of kidney injury like trouble passing urine or change in the amount of urine  signs and symptoms of liver injury like dark yellow or brown urine; general ill feeling or flu-like symptoms; light-colored stools; loss of appetite; nausea; right upper belly pain; unusually weak or tired; yellowing of the eyes or skin Side effects that usually do not require medical attention (report to your doctor or health care professional if they continue or are bothersome):  dizziness  hair loss  tiredness  upset stomach  vomiting This list may not describe all possible side effects. Call your doctor for medical advice about side effects. You may report side effects to FDA at  1-800-FDA-1088. Where should I keep my medicine? Keep out of the reach of children. Store at room temperature between 20 and 25 degrees C (68 and 77 degrees F). Protect from light. Throw away any unused medicine after the expiration date. NOTE: This sheet is a summary. It may not cover all possible information. If you have questions about this medicine, talk to your doctor, pharmacist, or health care provider.  2020 Elsevier/Gold Standard (2016-10-16 13:38:43)

## 2019-07-26 NOTE — Addendum Note (Signed)
Addended by: Ofilia Neas on: 07/26/2019 02:45 PM   Modules accepted: Orders

## 2019-07-26 NOTE — Progress Notes (Signed)
Pharmacy Note  Subjective: Patient presents today to [facility] for follow up office visit. Patient seen by the pharmacist for counseling on methotrexate for inflammatory arthritis.  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 She is naive to therapy.  Objective: CBC    Component Value Date/Time   WBC 6.5 05/03/2019 1034   RBC 4.50 05/03/2019 1034   HGB 13.1 05/03/2019 1034   HCT 39.3 05/03/2019 1034   PLT 284.0 05/03/2019 1034   MCV 87.5 05/03/2019 1034   MCH 29.3 08/04/2013 0835   MCHC 33.4 05/03/2019 1034   RDW 13.8 05/03/2019 1034   LYMPHSABS 2.1 04/18/2015 0736   MONOABS 0.4 04/18/2015 0736   EOSABS 0.0 04/18/2015 0736   BASOSABS 0.0 04/18/2015 0736    CMP     Component Value Date/Time   NA 139 05/03/2019 1034   K 4.1 05/03/2019 1034   CL 101 05/03/2019 1034   CO2 29 05/03/2019 1034   GLUCOSE 95 05/03/2019 1034   BUN 17 05/03/2019 1034   CREATININE 0.61 05/03/2019 1034   CREATININE 0.63 12/28/2012 1652   CALCIUM 10.0 05/03/2019 1034   PROT 7.2 05/03/2019 1034   ALBUMIN 4.6 05/03/2019 1034   AST 21 05/03/2019 1034   ALT 28 05/03/2019 1034   ALKPHOS 110 05/03/2019 1034   BILITOT 0.4 05/03/2019 1034   GFRNONAA >90 08/04/2013 0835   GFRAA >90 08/04/2013 0835    Baseline Immunosuppressant Therapy Labs TB GOLD Pending 07/26/2019  Hepatitis Panel Hepatitis Latest Ref Rng & Units 09/04/2016  Hep A IgM NON REACTIVE NON REACTIVE  Pending 07/26/2019  HIV Pending 07/26/2019  Immunoglobulins Pending 07/26/2019  SPEP Pending 07/26/2019  G6PD No results found for: G6PDH TPMT No results found for: TPMT   Chest-xray:  Pending 07/26/2019  Contraception: postmenopausal  Alcohol use: on occasion.  Advised of ACR recommendation to limit to 1 alcoholic beverage a week. Patient verbalized understanding.  Assessment/Plan:   Patient was counseled on the purpose, proper use, and adverse effects of methotrexate  including nausea, infection, and signs and symptoms of pneumonitis. Discussed that there is the possibility of an increased risk of malignancy, specifically lymphomas, but it is not well understood if this increased risk is due to the medication or the disease state.  Instructed patient that medication should be held for infection and prior to surgery.  Advised patient to avoid live vaccines. Recommend annual influenza, Pneumovax 23, Prevnar 13, and Shingrix as indicated.   Reviewed instructions with patient to take methotrexate weekly along with folic acid daily.  Discussed the importance of frequent monitoring of kidney and liver function and blood counts, and provided patient with standing lab instructions.  Counseled patient to avoid NSAIDs and alcohol while on methotrexate.  Provided patient with educational materials on methotrexate and answered all questions.   Patient voiced understanding.  Patient consented to methotrexate use.  Will upload into chart.    Dose of methotrexate will be 0.6 ml every 7 days for 2 week then increase to 0.8 ml every 7 days  along with folic acid 2mg  daily. Prescription pending labs and chest x-ray.  Patient will also start prednisone taper starting at 20 mg daily and decrease by 5 mg every 7 days.  Her blood pressure was elevated at her visit today.  Patient states  She forgot to take her blood pressure medication yesterday but otherwise is well-controlled.  Prescription sent with understanding that patient will monitor blood pressure at home and follow up with  PCP if it continues to be elevated.  All questions encouraged and answered.  Instructed patient to call with any questions or concerns.  Mariella Saa, PharmD, [place], [place] [facility] Pharmacist [phone]  07/26/2019 9:28 AM

## 2019-07-26 NOTE — Patient Instructions (Signed)
CXR today Ambulatory saturation.  Obtain sleep study from [facility] neurology. Trial of auto CPAP 5 to 15 cm, nasal mask of choice

## 2019-07-27 ENCOUNTER — Other Ambulatory Visit: Payer: 59

## 2019-07-27 NOTE — Assessment & Plan Note (Signed)
Obtain sleep study from [facility] neurology. Trial of auto CPAP 5 to 15 cm, nasal mask of choice   The pathophysiology of obstructive sleep apnea , it's cardiovascular consequences & modes of treatment including CPAP were discused with the patient in detail & they evidenced understanding.  CPAP settings can be fine-tuned after reviewing download in 1 month

## 2019-07-27 NOTE — Assessment & Plan Note (Signed)
With dry crackles on examination and interstitial prominence on chest x-ray, we have to rule out ILD in the setting of rheumatoid arthritis. Proceed with high-resolution CT chest to clarify She will also need PFTs

## 2019-07-28 ENCOUNTER — Telehealth: Payer: Self-pay | Admitting: Pulmonary Disease

## 2019-07-28 NOTE — Telephone Encounter (Signed)
Rigoberto Noel, MD  07/27/2019 8:34 AM EDT    Radiologist read chest x-ray is normal -but with finding of crackles on exam, early changes in the lung can be missed on chest x-ray, suggest we proceed with high-resolution CT chest without contrast to clarify   Called pt back and she did not answer so I left her a detailed msg on machine per her request. The ct has already been ordered.

## 2019-07-28 NOTE — Progress Notes (Signed)
Called pt and left detailed msg on machine with results per her request. See phone note dated 07/28/19.

## 2019-07-29 NOTE — Telephone Encounter (Signed)
LMTCB x2 for pt 

## 2019-08-01 ENCOUNTER — Ambulatory Visit (INDEPENDENT_AMBULATORY_CARE_PROVIDER_SITE_OTHER): Payer: 59 | Admitting: Family Medicine

## 2019-08-01 ENCOUNTER — Other Ambulatory Visit: Payer: 59

## 2019-08-01 ENCOUNTER — Other Ambulatory Visit: Payer: Self-pay

## 2019-08-01 ENCOUNTER — Encounter: Payer: Self-pay | Admitting: Family Medicine

## 2019-08-01 VITALS — BP 142/86 | HR 81 | Temp 97.0°F | Ht 64.0 in | Wt 203.1 lb

## 2019-08-01 DIAGNOSIS — Z23 Encounter for immunization: Secondary | ICD-10-CM | POA: Diagnosis not present

## 2019-08-01 DIAGNOSIS — F32A Depression, unspecified: Secondary | ICD-10-CM | POA: Insufficient documentation

## 2019-08-01 DIAGNOSIS — I10 Essential (primary) hypertension: Secondary | ICD-10-CM

## 2019-08-01 DIAGNOSIS — Z780 Asymptomatic menopausal state: Secondary | ICD-10-CM

## 2019-08-01 DIAGNOSIS — G4733 Obstructive sleep apnea (adult) (pediatric): Secondary | ICD-10-CM

## 2019-08-01 DIAGNOSIS — R0602 Shortness of breath: Secondary | ICD-10-CM | POA: Diagnosis not present

## 2019-08-01 DIAGNOSIS — E782 Mixed hyperlipidemia: Secondary | ICD-10-CM

## 2019-08-01 DIAGNOSIS — E2839 Other primary ovarian failure: Secondary | ICD-10-CM | POA: Diagnosis not present

## 2019-08-01 DIAGNOSIS — R0600 Dyspnea, unspecified: Secondary | ICD-10-CM

## 2019-08-01 DIAGNOSIS — F430 Acute stress reaction: Secondary | ICD-10-CM

## 2019-08-01 DIAGNOSIS — E069 Thyroiditis, unspecified: Secondary | ICD-10-CM

## 2019-08-01 DIAGNOSIS — M2550 Pain in unspecified joint: Secondary | ICD-10-CM

## 2019-08-01 DIAGNOSIS — F419 Anxiety disorder, unspecified: Secondary | ICD-10-CM | POA: Insufficient documentation

## 2019-08-01 DIAGNOSIS — R739 Hyperglycemia, unspecified: Secondary | ICD-10-CM

## 2019-08-01 LAB — COMPLETE METABOLIC PANEL WITH GFR
AG Ratio: 1.5 (calc) (ref 1.0–2.5)
ALT: 22 U/L (ref 6–29)
AST: 17 U/L (ref 10–35)
Albumin: 4.3 g/dL (ref 3.6–5.1)
Alkaline phosphatase (APISO): 109 U/L (ref 37–153)
BUN: 19 mg/dL (ref 7–25)
CO2: 27 mmol/L (ref 20–32)
Calcium: 9.7 mg/dL (ref 8.6–10.4)
Chloride: 102 mmol/L (ref 98–110)
Creat: 0.52 mg/dL (ref 0.50–1.05)
GFR, Est African American: 123 mL/min/{1.73_m2} (ref >=60)
GFR, Est Non African American: 106 mL/min/{1.73_m2} (ref >=60)
Globulin: 2.9 g/dL (calc) (ref 1.9–3.7)
Glucose, Bld: 113 mg/dL — ABNORMAL HIGH (ref 65–99)
Potassium: 4.4 mmol/L (ref 3.5–5.3)
Sodium: 139 mmol/L (ref 135–146)
Total Bilirubin: 0.3 mg/dL (ref 0.2–1.2)
Total Protein: 7.2 g/dL (ref 6.1–8.1)

## 2019-08-01 LAB — QUANTIFERON-TB GOLD PLUS
Mitogen-NIL: >10 IU/mL
NIL: 0.03 IU/mL
QuantiFERON-TB Gold Plus: NEGATIVE
TB1-NIL: <0 IU/mL
TB2-NIL: 0.04 IU/mL

## 2019-08-01 LAB — ANTI-SCLERODERMA ANTIBODY: Scleroderma (Scl-70) (ENA) Antibody, IgG: <1 AI

## 2019-08-01 LAB — PROTEIN ELECTROPHORESIS, SERUM, WITH REFLEX
Albumin ELP: 4.2 g/dL (ref 3.8–4.8)
Alpha 1: 0.3 g/dL (ref 0.2–0.3)
Alpha 2: 0.8 g/dL (ref 0.5–0.9)
Beta 2: 0.4 g/dL (ref 0.2–0.5)
Beta Globulin: 0.5 g/dL (ref 0.4–0.6)
Gamma Globulin: 1 g/dL (ref 0.8–1.7)
Total Protein: 7.2 g/dL (ref 6.1–8.1)

## 2019-08-01 LAB — BETA-2 GLYCOPROTEIN ANTIBODIES
Beta-2 Glyco 1 IgA: <9 SAU (ref <=20)
Beta-2 Glyco 1 IgM: 11 SMU (ref <=20)
Beta-2 Glyco I IgG: <9 SGU (ref <=20)

## 2019-08-01 LAB — C3 AND C4
C3 Complement: 172 mg/dL (ref 83–193)
C4 Complement: 34 mg/dL (ref 15–57)

## 2019-08-01 LAB — CARDIOLIPIN ANTIBODIES, IGG, IGM, IGA
Anticardiolipin IgA: <11 [APL'U]
Anticardiolipin IgG: <14 [GPL'U]
Anticardiolipin IgM: <12 [MPL'U]

## 2019-08-01 LAB — ANTI-DNA ANTIBODY, DOUBLE-STRANDED: ds DNA Ab: <1 IU/mL

## 2019-08-01 LAB — CBC WITH DIFFERENTIAL/PLATELET
Absolute Monocytes: 380 cells/uL (ref 200–950)
Basophils Absolute: 31 cells/uL (ref 0–200)
Basophils Relative: 0.6 %
Eosinophils Absolute: 42 cells/uL (ref 15–500)
Eosinophils Relative: 0.8 %
HCT: 40.3 % (ref 35.0–45.0)
Hemoglobin: 13.4 g/dL (ref 11.7–15.5)
Lymphs Abs: 1955 cells/uL (ref 850–3900)
MCH: 29 pg (ref 27.0–33.0)
MCHC: 33.3 g/dL (ref 32.0–36.0)
MCV: 87.2 fL (ref 80.0–100.0)
MPV: 10.4 fL (ref 7.5–12.5)
Monocytes Relative: 7.3 %
Neutro Abs: 2792 cells/uL (ref 1500–7800)
Neutrophils Relative %: 53.7 %
Platelets: 309 10*3/uL (ref 140–400)
RBC: 4.62 10*6/uL (ref 3.80–5.10)
RDW: 13.5 % (ref 11.0–15.0)
Total Lymphocyte: 37.6 %
WBC: 5.2 10*3/uL (ref 3.8–10.8)

## 2019-08-01 LAB — IGG, IGA, IGM
IgG (Immunoglobin G), Serum: 1098 mg/dL (ref 600–1640)
IgM, Serum: 148 mg/dL (ref 50–300)
Immunoglobulin A: 117 mg/dL (ref 47–310)

## 2019-08-01 LAB — SJOGRENS SYNDROME-A EXTRACTABLE NUCLEAR ANTIBODY: SSA (Ro) (ENA) Antibody, IgG: <1 AI

## 2019-08-01 LAB — RFLX DRVVT CONFRIM: DRVVT CONFIRM: NEGATIVE

## 2019-08-01 LAB — LUPUS ANTICOAGULANT EVAL W/ REFLEX
PTT-LA Screen: 35 s (ref <=40)
dRVVT: 46 s — ABNORMAL HIGH (ref <=45)

## 2019-08-01 LAB — 14-3-3 ETA PROTEIN: 14-3-3 eta Protein: <0.2 ng/mL (ref <0.2)

## 2019-08-01 LAB — HEPATITIS C ANTIBODY
Hepatitis C Ab: NONREACTIVE
SIGNAL TO CUT-OFF: 0.01 (ref <1.00)

## 2019-08-01 LAB — HIV ANTIBODY (ROUTINE TESTING W REFLEX): HIV 1&2 Ab, 4th Generation: NONREACTIVE

## 2019-08-01 LAB — CK: Total CK: 220 U/L — ABNORMAL HIGH (ref 29–143)

## 2019-08-01 LAB — HEPATITIS B CORE ANTIBODY, IGM: Hep B C IgM: NONREACTIVE

## 2019-08-01 LAB — ANTI-SMITH ANTIBODY: ENA SM Ab Ser-aCnc: <1 AI

## 2019-08-01 LAB — RNP ANTIBODY: Ribonucleic Protein(ENA) Antibody, IgG: <1 AI

## 2019-08-01 LAB — SJOGRENS SYNDROME-B EXTRACTABLE NUCLEAR ANTIBODY: SSB (La) (ENA) Antibody, IgG: <1 AI

## 2019-08-01 LAB — HEPATITIS B SURFACE ANTIGEN: Hepatitis B Surface Ag: NONREACTIVE

## 2019-08-01 LAB — CYCLIC CITRUL PEPTIDE ANTIBODY, IGG: Cyclic Citrullin Peptide Ab: <16 UNITS

## 2019-08-01 NOTE — Assessment & Plan Note (Signed)
Her son has been diagnosed with Ewing's Sarcoma which presented in his Adrenal gland. He is 23 and undergoing treatments at [facility]. She struggles with the stress but is getting through it [facility]

## 2019-08-01 NOTE — Assessment & Plan Note (Signed)
Follows with Dr Buddy Duty, endocrinology. Feels better on Levothyroxine 50 mcg daily

## 2019-08-01 NOTE — Assessment & Plan Note (Signed)
Worsening DOE and she believes it is related to to deconditioning but no echo since 2014 so that is ordered today. She has a CT scan pending with pulmonology and she will report any worsening

## 2019-08-01 NOTE — Progress Notes (Signed)
Subjective:    Patient ID: Sandra [name], female    DOB: 09-13-1960, 59 y.o.   MRN: [id number]  Chief Complaint  Patient presents with  . Follow-up    HPI Patient is in today for follow up on chronic medical concerns and has a great deal going on. Is following with pulmonology for sleep apnea, awaiting CPAP and dry cough awaiting CT scan. CXR was normal. She has diffuse arthralgias and they are treating her soon with MTX for presumed RA. She needs immunizations and Dexa scan. Her son has been diagnosed with cancer and is being treated at [facility] and she notes her fatigue is better with levothyroxine 50. She still notes DOE. Denies CP/palp/HA/congestion/fevers/GI or GU c/o. Taking meds as prescribed  Past Medical History:  Diagnosis Date  . Allergic rhinitis 05/31/2012  . Chest pain    PT SAW CARDIOLOGIST DR. Burt Knack - STRESS ECHO DONE NEGATIVE - NO PROBLEM SINCE  . Chicken pox as achild  . Cough 11/29/2012   RESOLVED - IT WAS CAUSED BY LISINOPRIL  . Elevated LFTs 05/31/2012  . Eustachian tube dysfunction 07/27/2017  . GERD (gastroesophageal reflux disease)   . H/O tobacco use, presenting hazards to health 11/29/2012   Smoked 1 1/2 ppd for 20 years quit in roughtly 2007  . Headache 11/25/2015  . Hematuria 04/09/2014  . Hoarseness 02/04/2012   RESOLVED - THOUGHT TO HAVE BEEN ALLERY RELATED  . Hyperglycemia 04/06/2014  . Hyperlipidemia   . Hyperparathyroidism ([place])   . Hypertension   . Obesity, unspecified 05/31/2012  . Ocular migraine   . Overactive bladder 05/31/2012  . Preventative health care 02/04/2012  . Rapid heart beat    PT FEELS PROB RELATED TO HYPER PARATHYROIDISM  . SUI (stress urinary incontinence, female) 02/04/2012   Sees Dr Perlie Gold and they have discussed a bladder tack but so far she declines    Past Surgical History:  Procedure Laterality Date  . BREAST EXCISIONAL BIOPSY Left    Age 75 Fibroadenoma  . BREAST SURGERY     left breast excision of adenoma  .  btl    . PARATHYROIDECTOMY Right 08/12/2013   Procedure: right inferior frozen section PARATHYROIDECTOMY;  Surgeon: Earnstine Regal, MD;  Location: [facility];  Service: General;  Laterality: Right;  . PELVIC LAPAROSCOPY  03-2004  . TOTAL SHOULDER REPLACEMENT Right 01/2019  . TUBAL LIGATION    . WISDOM TOOTH EXTRACTION  30 yrs ago    Family History  Problem Relation Age of Onset  . Hypertension Mother 76       alive  . Renal cancer Mother        renal  . Hyperlipidemia Mother   . Osteoporosis Mother        humerus   . Macular degeneration Mother   . Tremor Mother   . Parkinson's disease Mother   . Hypertension Father 42       alive  . Atrial fibrillation Father   . Colon cancer Father        colon  . Hyperlipidemia Father   . Stroke Father   . Alzheimer's disease Father   . Hypertension Brother 23       alive  . Tremor Brother   . Hypertension Sister   . Lung cancer Maternal Grandfather        lung/ smoker  . Emphysema Maternal Grandfather   . Heart disease Paternal Grandmother   . Other Paternal Grandfather  black lung  . Heart disease Paternal Grandfather   . Asthma Son   . Eczema Son   . Healthy Son   . Healthy Son   . Heart disease Paternal Aunt   . Heart disease Paternal Uncle     Social History   Socioeconomic History  . Marital status: Married    Spouse name: Not on file  . Number of children: 2  . Years of education: Not on file  . Highest education level: Not on file  Occupational History  . Occupation: Programmer, multimedia: [employer]  Tobacco Use  . Smoking status: Former Smoker    Packs/day: 1.00    Years: 20.00    Pack years: 20.00    Types: Cigarettes    Quit date: 03/10/2005    Years since quitting: 14.4  . Smokeless tobacco: Never Used  Substance and Sexual Activity  . Alcohol use: Yes    Comment: very rarely  . Drug use: No  . Sexual activity: Yes    Partners: Male  Other Topics Concern  . Not on file  Social History Narrative    . Not on file   Social Determinants of Health   Financial Resource Strain:   . Difficulty of Paying Living Expenses:   Food Insecurity:   . Worried About Charity fundraiser in the Last Year:   . Arboriculturist in the Last Year:   Transportation Needs:   . Film/video editor (Medical):   Marland Kitchen Lack of Transportation (Non-Medical):   Physical Activity:   . Days of Exercise per Week:   . Minutes of Exercise per Session:   Stress:   . Feeling of Stress :   Social Connections:   . Frequency of Communication with Friends and Family:   . Frequency of Social Gatherings with Friends and Family:   . Attends Religious Services:   . Active Member of Clubs or Organizations:   . Attends Archivist Meetings:   Marland Kitchen Marital Status:   Intimate Partner Violence:   . Fear of Current or Ex-Partner:   . Emotionally Abused:   Marland Kitchen Physically Abused:   . Sexually Abused:     Outpatient Medications Prior to Visit  Medication Sig Dispense Refill  . aspirin 325 MG tablet Take 325 mg by mouth daily.    Marland Kitchen atorvastatin (LIPITOR) 20 MG tablet TAKE 1 TABLET BY MOUTH EVERY DAY 90 tablet 1  . Cholecalciferol (VITAMIN D PO) Take 2,000 mg by mouth daily. Vitamin D    . Co-Enzyme Q-10 30 MG CAPS Take 100 mg by mouth at bedtime.    . DOCOSAHEXAENOIC ACID PO Take 1 tablet by mouth daily.    . furosemide (LASIX) 20 MG tablet TAKE 1 TABLET BY MOUTH EVERY DAY 90 tablet 1  . irbesartan (AVAPRO) 150 MG tablet TAKE 1 TABLET BY MOUTH EVERY DAY 90 tablet 0  . lactobacillus acidophilus (BACID) TABS tablet Take 2 tablets by mouth 3 (three) times daily.    Marland Kitchen levothyroxine (SYNTHROID) 50 MCG tablet Take 50 mcg by mouth every morning.    . metoprolol succinate (TOPROL-XL) 25 MG 24 hr tablet TAKE 1 TABLET BY MOUTH EVERY DAY 90 tablet 0  . omeprazole (PRILOSEC) 40 MG capsule TAKE 1 CAPSULE BY MOUTH EVERY DAY 90 capsule 3  . OVER THE COUNTER MEDICATION Take 1 capsule by mouth daily. Mega red- 1 capsule daily    .  predniSONE (DELTASONE) 5 MG tablet Take 4  tablets by mouth daily x1 week, 3 tablets by mouth daily x1 week, 2 tablets by mouth daily x1 week, 1 tablet by mouth daily x1 week. 70 tablet 0  . verapamil (CALAN-SR) 120 MG CR tablet TAKE 1 TABLET BY MOUTH EVERYDAY AT BEDTIME 90 tablet 0  . aspirin EC 81 MG tablet Take 325 mg by mouth at bedtime as needed.     Marland Kitchen levothyroxine (SYNTHROID) 25 MCG tablet Take 1 tablet (25 mcg total) by mouth daily before breakfast. 30 tablet 3   No facility-administered medications prior to visit.    Allergies  Allergen Reactions  . Cefdinir Other (See Comments)    Severe Stomach cramps and diarrhea for several days.  . Doxycycline Diarrhea  . Codeine Nausea And Vomiting  . Penicillins     RASH, ITCHING  . Simvastatin     myalgias    Review of Systems  Constitutional: Negative for fever and malaise/fatigue.  HENT: Negative for congestion.   Eyes: Negative for blurred vision.  Respiratory: Positive for cough and shortness of breath. Negative for sputum production.   Cardiovascular: Negative for chest pain, palpitations and leg swelling.  Gastrointestinal: Negative for abdominal pain, blood in stool and nausea.  Genitourinary: Negative for dysuria and frequency.  Musculoskeletal: Positive for back pain, joint pain and myalgias. Negative for falls.  Skin: Negative for rash.  Neurological: Negative for dizziness, loss of consciousness and headaches.  Endo/Heme/Allergies: Negative for environmental allergies.  Psychiatric/Behavioral: Negative for depression. The patient is nervous/anxious.        Objective:    Physical Exam Vitals and nursing note reviewed.  Constitutional:      General: She is not in acute distress.    Appearance: She is well-developed.  HENT:     Head: Normocephalic and atraumatic.     Nose: Nose normal.  Eyes:     General:        Right eye: No discharge.        Left eye: No discharge.  Cardiovascular:     Rate and Rhythm:  Normal rate and regular rhythm.     Heart sounds: No murmur.  Pulmonary:     Effort: Pulmonary effort is normal.     Breath sounds: Normal breath sounds.  Abdominal:     General: Bowel sounds are normal.     Palpations: Abdomen is soft.     Tenderness: There is no abdominal tenderness.  Musculoskeletal:     Cervical back: Normal range of motion and neck supple.  Skin:    General: Skin is warm and dry.  Neurological:     Mental Status: She is alert and oriented to person, place, and time.     BP (!) 142/86 (BP Location: Right Arm, Patient Position: Sitting, Cuff Size: Normal)   Pulse 81   Temp (!) 97 F (36.1 C) (Temporal)   Ht 5\' 4"  (1.626 m)   Wt 203 lb 2 oz (92.1 kg)   LMP 04/01/2011   SpO2 97%   BMI 34.87 kg/m  Wt Readings from Last 3 Encounters:  08/01/19 203 lb 2 oz (92.1 kg)  07/26/19 210 lb (95.3 kg)  07/26/19 209 lb (94.8 kg)    Diabetic Foot Exam - Simple   No data filed     Lab Results  Component Value Date   WBC 5.2 07/26/2019   HGB 13.4 07/26/2019   HCT 40.3 07/26/2019   PLT 309 07/26/2019   GLUCOSE 113 (H) 07/26/2019   CHOL 229 (H) 12/14/2018  TRIG 170.0 (H) 12/14/2018   HDL 50.30 12/14/2018   LDLDIRECT 201.0 12/14/2015   LDLCALC 145 (H) 12/14/2018   ALT 22 07/26/2019   AST 17 07/26/2019   NA 139 07/26/2019   K 4.4 07/26/2019   CL 102 07/26/2019   CREATININE 0.52 07/26/2019   BUN 19 07/26/2019   CO2 27 07/26/2019   TSH 2.59 05/03/2019   HGBA1C 5.9 12/14/2018    Lab Results  Component Value Date   TSH 2.59 05/03/2019   Lab Results  Component Value Date   WBC 5.2 07/26/2019   HGB 13.4 07/26/2019   HCT 40.3 07/26/2019   MCV 87.2 07/26/2019   PLT 309 07/26/2019   Lab Results  Component Value Date   NA 139 07/26/2019   K 4.4 07/26/2019   CO2 27 07/26/2019   GLUCOSE 113 (H) 07/26/2019   BUN 19 07/26/2019   CREATININE 0.52 07/26/2019   BILITOT 0.3 07/26/2019   ALKPHOS 110 05/03/2019   AST 17 07/26/2019   ALT 22 07/26/2019    PROT 7.2 07/26/2019   PROT 7.2 07/26/2019   ALBUMIN 4.6 05/03/2019   CALCIUM 9.7 07/26/2019   GFR 100.99 05/03/2019   Lab Results  Component Value Date   CHOL 229 (H) 12/14/2018   Lab Results  Component Value Date   HDL 50.30 12/14/2018   Lab Results  Component Value Date   LDLCALC 145 (H) 12/14/2018   Lab Results  Component Value Date   TRIG 170.0 (H) 12/14/2018   Lab Results  Component Value Date   CHOLHDL 5 12/14/2018   Lab Results  Component Value Date   HGBA1C 5.9 12/14/2018       Assessment & Plan:   Problem List Items Addressed This Visit    Hypertension    Running 130/80 at home. She will continue to monitor and report worsening      Hyperlipidemia    Encouraged heart healthy diet, increase exercise, avoid trans fats, consider a krill oil cap daily. She declines labs today      Dyspnea    Worsening DOE and she believes it is related to to deconditioning but no echo since 2014 so that is ordered today. She has a CT scan pending with pulmonology and she will report any worsening      Hyperglycemia    minimize simple carbs. Increase exercise as tolerated      Thyroiditis    Follows with Dr Buddy Duty, endocrinology. Feels better on Levothyroxine 50 mcg daily      OSA (obstructive sleep apnea)    Follows with Dr Elsworth Soho and they are getting her set up with a CPAP. She did report a dry cough which comes and goes so they are proceeding with CT chest in June.       Arthralgia    Following with rheumatology now and has been told distribution fits RA after extensive imaging including xrays of hands, shoulders, knees and feet. They are going to start her on MTX. Dexa scan is ordered today and she is given Pneumovax and Shingrix today. Will need second Shingrix at next appt in 4 months      Stress disorder, acute    Her son has been diagnosed with Ewing's Sarcoma which presented in his Adrenal gland. He is 23 and undergoing treatments at [facility]. She struggles with  the stress but is getting through it OK       Other Visit Diagnoses    Post-menopausal    -  Primary   Relevant Orders   DG Bone Density   Estrogen deficiency       Relevant Orders   DG Bone Density   SOB (shortness of breath) on exertion       Relevant Orders   ECHOCARDIOGRAM COMPLETE      I have discontinued Almyra Free A. Brannen's aspirin EC. I am also having her maintain her Cholecalciferol (VITAMIN D PO), OVER THE COUNTER MEDICATION, lactobacillus acidophilus, Co-Enzyme Q-10, DOCOSAHEXAENOIC ACID PO, verapamil, irbesartan, omeprazole, atorvastatin, furosemide, metoprolol succinate, aspirin, levothyroxine, and predniSONE.  No orders of the defined types were placed in this encounter.    Penni Homans, MD

## 2019-08-01 NOTE — Assessment & Plan Note (Signed)
Following with rheumatology now and has been told distribution fits RA after extensive imaging including xrays of hands, shoulders, knees and feet. They are going to start her on MTX. Dexa scan is ordered today and she is given Pneumovax and Shingrix today. Will need second Shingrix at next appt in 4 months

## 2019-08-01 NOTE — Assessment & Plan Note (Signed)
Running 130/80 at home. She will continue to monitor and report worsening

## 2019-08-01 NOTE — Assessment & Plan Note (Signed)
Follows with Dr Elsworth Soho and they are getting her set up with a CPAP. She did report a dry cough which comes and goes so they are proceeding with CT chest in June.

## 2019-08-01 NOTE — Assessment & Plan Note (Signed)
Encouraged heart healthy diet, increase exercise, avoid trans fats, consider a krill oil cap daily. She declines labs today

## 2019-08-01 NOTE — Addendum Note (Signed)
Addended by: Sharon Seller B on: 08/01/2019 09:33 AM   Modules accepted: Orders

## 2019-08-01 NOTE — Patient Instructions (Signed)

## 2019-08-01 NOTE — Assessment & Plan Note (Addendum)
minimize simple carbs. Increase exercise as tolerated.  

## 2019-08-02 NOTE — Progress Notes (Signed)
CK is mildly elevated.  I will repeat CK at the follow-up visit.  I will discuss results at the follow-up visit.

## 2019-08-05 ENCOUNTER — Other Ambulatory Visit: Payer: 59

## 2019-08-07 NOTE — Progress Notes (Signed)
Office Visit Note  Patient: Sandra [name]             Date of Birth: 1960-10-19           MRN: [id number]             PCP: [name], MD Referring: [name], MD Visit Date: 08/17/2019 Occupation: _0 @  Subjective:  Discuss MTX   History of Present Illness: Sandra [name] is a 59 y.o. female with history of seronegative rheumatoid arthritis and osteoarthritis.  Patient presents today to discuss lab work and discuss starting on injectable methotrexate.  She reports that she has felt significantly better while taking prednisone.  She will be tapering down to 5 mg daily starting today.  She has been tolerating prednisone and been monitoring her blood pressure closely which has been well controlled.  She states that she is having some discomfort in the right first MCP joint but states that the rest of her joint pain and inflammation has improved.  She reviewed the information about methotrexate and is ready to start on methotrexate.  She states that she would like to do vial and syringe. She was evaluated by Dr. Elsworth Soho and has a high-resolution chest CT scheduled for this afternoon.  She continues to have shortness of breath with exertion. Patient reports that she also had a DEXA on 08/12/2019 but has not heard back about the results.  She continues to take a vitamin D supplement. She states that her level of fatigue has improved while taking prednisone as well as since her PCP increased her dose of Synthroid.     Activities of Daily Living:  Patient reports morning stiffness for 30 minutes.   Patient Denies nocturnal pain.  Difficulty dressing/grooming: Denies Difficulty climbing stairs: Denies Difficulty getting out of chair: Denies Difficulty using hands for taps, buttons, cutlery, and/or writing: Denies  Review of Systems  Constitutional: Positive for fatigue.  HENT: Positive for mouth dryness. Negative for mouth sores and nose dryness.   Eyes: Negative for  pain, visual disturbance and dryness.  Respiratory: Negative for cough, hemoptysis, shortness of breath and difficulty breathing.   Cardiovascular: Negative for chest pain, palpitations, hypertension and swelling in legs/feet.  Gastrointestinal: Negative for blood in stool, constipation and diarrhea.  Endocrine: Negative for increased urination.  Genitourinary: Negative for painful urination.  Musculoskeletal: Positive for morning stiffness. Negative for arthralgias, joint pain, joint swelling, myalgias, muscle weakness, muscle tenderness and myalgias.  Skin: Negative for color change, pallor, rash, hair loss, nodules/bumps, skin tightness, ulcers and sensitivity to sunlight.  Allergic/Immunologic: Negative for susceptible to infections.  Neurological: Negative for dizziness, numbness, headaches and weakness.  Hematological: Negative for swollen glands.  Psychiatric/Behavioral: Negative for depressed mood and sleep disturbance. The patient is not nervous/anxious.     PMFS History:  Patient Active Problem List   Diagnosis Date Noted  . Stress disorder, acute 08/01/2019  . Arthralgia 05/04/2019  . OSA (obstructive sleep apnea) 05/03/2019  . Thyroiditis 12/20/2018  . Muscle cramp 12/20/2018  . Eustachian tube dysfunction 07/27/2017  . Headache 11/25/2015  . Acute bacterial sinusitis 03/26/2015  . Hematuria 04/09/2014  . Hyperglycemia 04/06/2014  . Diarrhea 09/21/2013  . Hyperparathyroidism, primary ([place]) 07/12/2013  . Dyspnea 12/28/2012  . Hypercalcemia 12/16/2012  . H/O tobacco use, presenting hazards to health 11/29/2012  . Overactive bladder 05/31/2012  . Elevated LFTs 05/31/2012  . Obesity 05/31/2012  . Allergic rhinitis 05/31/2012  . Hoarseness 02/04/2012  . Preventative health care 02/04/2012  .  SUI (stress urinary incontinence, female) 02/04/2012  . Migraine   . GERD (gastroesophageal reflux disease)   . Rotator cuff syndrome of right shoulder 06/11/2011  . Atypical  chest pain 01/03/2011  . Hypertension 01/03/2011  . Hyperlipidemia 01/03/2011  . KNEE PAIN, BILATERAL 09/28/2008  . ANSERINE BURSITIS 09/28/2008  . 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 Allergic rhinitis 05/31/2012  . Chest pain    PT SAW CARDIOLOGIST DR. Burt Knack - STRESS ECHO DONE NEGATIVE - NO PROBLEM SINCE  . Chicken pox as achild  . Cough 11/29/2012   RESOLVED - IT WAS CAUSED BY LISINOPRIL  . Elevated LFTs 05/31/2012  . Eustachian tube dysfunction 07/27/2017  . GERD (gastroesophageal reflux disease)   . H/O tobacco use, presenting hazards to health 11/29/2012   Smoked 1 1/2 ppd for 20 years quit in roughtly 2007  . Headache 11/25/2015  . Hematuria 04/09/2014  . Hoarseness 02/04/2012   RESOLVED - THOUGHT TO HAVE BEEN ALLERY RELATED  . Hyperglycemia 04/06/2014  . Hyperlipidemia   . Hyperparathyroidism ([place])   . Hypertension   . Obesity, unspecified 05/31/2012  . Ocular migraine   . Overactive bladder 05/31/2012  . Preventative health care 02/04/2012  . Rapid heart beat    PT FEELS PROB RELATED TO HYPER PARATHYROIDISM  . SUI (stress urinary incontinence, female) 02/04/2012   Sees Dr Perlie Gold and they have discussed a bladder tack but so far she declines    Family History  Problem Relation Age of Onset  . Hypertension Mother 89       alive  . Renal cancer Mother        renal  . Hyperlipidemia Mother   . Osteoporosis Mother        humerus   . Macular degeneration Mother   . Tremor Mother   . Parkinson's disease Mother   . Hypertension Father 39       alive  . Atrial fibrillation Father   . Colon cancer Father        colon  . Hyperlipidemia Father   . Stroke Father   . Alzheimer's disease Father   . Hypertension Brother 45       alive  . Tremor Brother   . Hypertension Sister   . Lung cancer Maternal Grandfather        lung/ smoker  . Emphysema Maternal Grandfather   . Heart disease Paternal Grandmother   . Other  Paternal Grandfather        black lung  . Heart disease Paternal Grandfather   . Asthma Son   . Eczema Son   . Healthy Son   . Healthy Son   . Heart disease Paternal Aunt   . Heart disease Paternal Uncle    Past Surgical History:  Procedure Laterality Date  . BREAST EXCISIONAL BIOPSY Left    Age 34 Fibroadenoma  . BREAST SURGERY     left breast excision of adenoma  . btl    . PARATHYROIDECTOMY Right 08/12/2013   Procedure: right inferior frozen section PARATHYROIDECTOMY;  Surgeon: Earnstine Regal, MD;  Location: [facility];  Service: General;  Laterality: Right;  . PELVIC LAPAROSCOPY  03-2004  . TOTAL SHOULDER REPLACEMENT Right 01/2019  . TUBAL LIGATION    . WISDOM TOOTH EXTRACTION  30 yrs ago   Social History   Social History Narrative  . Not on file   Immunization History  Administered Date(s) Administered  . Hepatitis  A, Adult 09/04/2016, 03/27/2017  . Influenza Split 12/09/2011  . Influenza,inj,Quad PF,6+ Mos 11/29/2012, 11/01/2013, 11/20/2015, 12/29/2017, 04/03/2019  . PFIZER SARS-COV-2 Vaccination 05/20/2019, 06/15/2019  . Pneumococcal Polysaccharide-23 08/01/2019  . Tdap 06/07/2015  . Zoster Recombinat (Shingrix) 08/01/2019     Objective: Vital Signs: BP (!) 147/98 (BP Location: Right Arm, Patient Position: Sitting, Cuff Size: Normal)   Pulse 80   Resp 14   Ht _0  (1.626 m)   Wt 209 lb 9.6 oz (95.1 kg)   LMP 04/01/2011   BMI 35.98 kg/m    Physical Exam Vitals and nursing note reviewed.  Constitutional:      Appearance: She is well-developed.  HENT:     Head: Normocephalic and atraumatic.  Eyes:     Conjunctiva/sclera: Conjunctivae normal.  Pulmonary:     Effort: Pulmonary effort is normal.  Abdominal:     General: Bowel sounds are normal.     Palpations: Abdomen is soft.  Musculoskeletal:     Cervical back: Normal range of motion.  Lymphadenopathy:     Cervical: No cervical adenopathy.  Skin:    General: Skin is warm and dry.     Capillary Refill:  Capillary refill takes less than 2 seconds.  Neurological:     Mental Status: She is alert and oriented to person, place, and time.  Psychiatric:        Behavior: Behavior normal.      Musculoskeletal Exam: C-spine, thoracic spine, lumbar spine good range of motion.  Right shoulder replacement has good range of motion with no discomfort.  Left shoulder has good range of motion with no discomfort.  Elbow joints and wrist joints have good range of motion no tenderness or inflammation.  She has PIP and DIP thickening consistent with osteoarthritis of both hands.  She has tenderness over the right first, third, and fourth MCP joints.  Tenderness of the left second and third MCP joints noted.  Hip joints have good range of motion with no discomfort.  Knee joints have good range of motion with no warmth or effusion.  Bilateral knee crepitus.  She has warmth over the right ankle joint.  CDAI Exam: CDAI Score: 5.6  Patient Global: 3 mm; Provider Global: 3 mm Swollen: 1 ; Tender: 5  Joint Exam 08/17/2019      Right  Left  MCP 1   Tender     MCP 2      Tender  MCP 3   Tender   Tender  MCP 4   Tender     Ankle  Swollen         Investigation: No additional findings.  Imaging: DG Chest 2 View  Result Date: 07/26/2019 CLINICAL DATA:  59 year old female with shortness of breath. EXAM: CHEST - 2 VIEW COMPARISON:  Chest radiographs 11/29/2012 and earlier. FINDINGS: Lung volumes are stable at the upper limits of normal to mildly hyperinflated. Mediastinal contours remain normal. Visualized tracheal air column is within normal limits. No pneumothorax, pulmonary edema, pleural effusion or confluent pulmonary opacity. Interval right shoulder arthroplasty. No acute osseous abnormality identified. Negative visible bowel gas pattern. IMPRESSION: No acute cardiopulmonary abnormality. Electronically Signed   By: Genevie Ann M.D.   On: 07/26/2019 16:00   DG Bone Density  Result Date: 08/12/2019 EXAM: DUAL X-RAY  ABSORPTIOMETRY (DXA) FOR BONE MINERAL DENSITY IMPRESSION: [place] A BLYTH Your patient Alex Mcmanigal completed a BMD test on 08/12/2019 using the [place] (analysis version: 16.SP2) manufactured by [employer]. The  following summarizes the results of our evaluation. [facility] PATIENT: Name: Harjit, Douds Patient ID: [id number] Birth Date: 1960-11-10 Height: 64.0 in. Gender: Female Measured: 08/12/2019 Weight: 209.0 lbs. Indications: Caucasian, Estrogen Deficiency, HyperPARAthyroidism, Low Calcium Intake, Post Menopausal, Previous Tobacco User, Rheumatoid Arthritis Fractures: Elbow Treatments: HRT, Levothyroxine, Prednisone, Vitamin D ASSESSMENT: The BMD measured at Forearm Radius 33% is 0.751 g/cm2 with a T-score of -1.4. This patient is considered OSTEOPENIC according to [facility]) criteria. The patient has hypoparathyroidism so forearm DEXA was performed. The scan quality is good. Site Region Measured Date Measured Age WHO YA BMD Classification T-score AP Spine L1-L4 08/12/2019 58.5 Osteopenia -1.2 1.033 g/cm2 DualFemur Neck Right 08/12/2019 [age] Osteopenia -1.3 0.863 g/cm2 Left Forearm Radius 33% 08/12/2019 57.5 Osteopenia -1.4 0.751 g/cm2 World Health Organization [facility]) criteria for post-menopausal, Caucasian Women: Normal       T-score at or above -1 SD Osteopenia   T-score between -1 and -2.5 SD Osteoporosis T-score at or below -2.5 SD RECOMMENDATION: 1. All patients should optimize calcium and vitamin D intake. 2. Consider FDA-approved medical therapies in postmenopausal women and men aged 58 years and older, based on the following: a. A hip or vertebral(clinical or morphometric) fracture. b. T-score < -2.5 at the femoral neck or spine after appropriate evaluation to exclude secondary causes c. Low bone mass (T-score between -1.0 and -2.5 at the femoral neck or spine) and a 10 year probability of a hip fracture >3% or a 10 year probability of major osteoporosis-related  fracture > 20% based on the [place]-adapted WHO algorithm d. Clinical judgement and/or patient preferences may indicate treatment for people with 10-year fracture probabilities above or below these levels FOLLOW-UP: Patients with diagnosis of osteoporosis or at high risk for fracture should have regular bone mineral density tests. For patients eligible for Medicare, routine testing is allowed once every 2 years. The testing frequency can be increased to one year for patients who have rapidly progressing disease, those who are receiving or discontinuing medical therapy to restore bone mass, or have additional risk factors. I have reviewed this report and agree with the above findings. Mark A. Thornton Papas, M.D. [facility] Patient: [name] Referring Physician: [name] Birth Date: 1960/07/17 [age]:       [age] Patient ID: [id number] Height: 64.0 in. Weight: 209.0 lbs. 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 ASSESSMENT: The probability of a major osteoporotic fracture is 8.1% within the next ten years. The probability of a hip fracture is 0.6% within the next ten years. I have reviewed this report and agree with the above findings. Mark A. Thornton Papas, M.D. [facility] Electronically Signed   By: Lavonia Dana M.D.   On: 08/12/2019 13:20   XR Foot 2 Views Left  Result Date: 07/26/2019 Juxta-articular osteopenia was noted.  Third MTP severe narrowing with possible early erosive changes  were noted.  PIP and DIP narrowing was  noted.  Metatarsal tarsal narrowing and spurring was noted.  No tibiotalar or subtalar joint space narrowing was noted.  Inferior calcaneal spur was noted. Impression: These findings are consistent with inflammatory arthritis and osteoarthritis overlap.  XR Foot 2 Views Right  Result Date: 07/26/2019 Juxta-articular osteopenia was noted.  Third MTP narrowing, PIP and DIP narrowing was noted.  No intertarsal or tibiotalar joint space narrowing was noted.  A small calcaneal spur was noted. Impression: These findings are consistent with inflammatory arthritis and osteoarthritis overlap.  XR Hand 2 View Left  Result Date: 07/26/2019 Juxta-articular osteopenia was noted.  Narrowing of all MCP joints was noted.  Most severe narrowing was noted in the third MCP joint.  PIP and DIP narrowing was noted.  No intercarpal or radiocarpal joint space narrowing was noted.  No erosive changes were noted. Impression: These findings are consistent with inflammatory arthritis most likely rheumatoid arthritis.  XR Hand 2 View Right  Result Date: 07/26/2019 Juxta-articular osteopenia was noted.  Narrowing of all MCP joints was noted.  Most severe narrowing was noted in the third MCP joint.  PIP and DIP narrowing was noted.  No intercarpal or radiocarpal joint space narrowing was noted.  No erosive changes were noted. Impression: These findings are consistent with inflammatory arthritis most likely rheumatoid arthritis.  XR KNEE 3 VIEW LEFT  Result Date: 07/26/2019 Moderate medial compartment narrowing with the medial osteophytes was noted.  Moderate patellofemoral narrowing was noted.  Effusion was noted.  No chondrocalcinosis was noted. Impression: These findings are consistent with moderate osteoarthritis and moderate chondromalacia patella.  XR KNEE 3 VIEW RIGHT  Result Date: 07/26/2019 Moderate medial compartment narrowing with the medial osteophytes was noted.  Moderate patellofemoral narrowing was noted.  No  chondrocalcinosis was noted. Impression: These findings are consistent with moderate osteoarthritis and moderate chondromalacia patella.   Recent Labs: Lab Results  Component Value Date   WBC 5.2 07/26/2019   HGB 13.4 07/26/2019   PLT 309 07/26/2019   NA 139 07/26/2019   K 4.4 07/26/2019   CL 102 07/26/2019   CO2 27 07/26/2019   GLUCOSE 113 (H) 07/26/2019   BUN 19 07/26/2019   CREATININE 0.52 07/26/2019   BILITOT 0.3 07/26/2019   ALKPHOS 110 05/03/2019   AST 17 07/26/2019   ALT 22 07/26/2019   PROT 7.2 07/26/2019   PROT 7.2 07/26/2019   ALBUMIN 4.6 05/03/2019   CALCIUM 9.7 07/26/2019   GFRAA 123 07/26/2019   QFTBGOLDPLUS NEGATIVE 07/26/2019  May 2021 lupus anticoagulant negative, beta-2 negative, anticardiolipin negative, ENA negative, C3-C4 normal, anti-CCP negative, _0 eta negative, CK 220  05/03/19: ANA 1:40 cytoplasmic, Thyroperoxidase ab 118, T4 0.53, TSH 2.59, ESR 13, RF<14, Vitamin D 45.68  Speciality Comments: No specialty comments available.  Procedures:  No procedures performed Allergies: Cefdinir, Doxycycline, Codeine, Penicillins, and Simvastatin   Assessment / Plan:     Visit Diagnoses: Rheumatoid arthritis of multiple sites with negative rheumatoid factor (HCC) - ANA 1: 40 cytoplasmic, all other antibodies negative.  Patient relates a significant Tritus involving multiple joints and bilateral knee joint effusions: Lab work and x-rays from 07/26/2019 were reviewed with the patient today in the office and all questions were addressed.  She has noticed a significant improvement in her joint pain and inflammation since starting on the prednisone taper prescribed on 07/26/2019.  She will be reducing the dose to 5 mg today.  On exam, she continues to have  tenderness of the right first, third, fourth MCPs and left second and third MCP joints.  Warmth of the right ankle joint was noted. She has reviewed the informational handout about methotrexate and does not have any  additional questions.  We reviewed potential side effects and the dosing schedule.  We also reviewed the lab schedule that she will follow.  She is aware that she will take folic acid 2 mg on a daily basis.  She would like to start on methotrexate vial and syringe 0.6 mL subcutaneous injections once weekly for 2 weeks and if labs are stable in 2 weeks she will increase to 0.8 mL once weekly.  Prescription pending high-resolution chest CT results.  She was advised to notify us if she cannot tolerate taking methotrexate. She was evaluated by Dr. Elsworth Soho for the dry cough, shortness of breath with exertion, and crackles in her lungs she is been experiencing.  She is scheduled for a high-resolution chest CT today. She will follow-up in the office in 6 weeks.  High risk medication use: All baseline immunosuppressive lab work was negative on 07/26/2019.  CBC and CMP were within normal limits on 07/26/2019.  She will be starting on methotrexate 0.6 mL subcutaneous injections once weekly for 2 weeks and if labs are stable at that time she will increase to 0.8 mL subcu days injections once weekly.  She will take folic acid 2 mg by mouth daily.  Prescription pending high-resolution chest CT results today.  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 Ivin Booty, [place].  Doing well.  She has good range of motion with no discomfort at this time.  Primary osteoarthritis of both hands: She has PIP and DIP thickening consistent with osteoarthritis of both hands.  No tenderness or inflammation was noted.  She is complete fist formation bilaterally.  Joint protection and muscle strengthening were discussed.  Dry cough - She has crackles in her lungs. Chest x-ray is normal.  She was evaluated by Dr. Elsworth Soho.  She continues to have some shortness of breath with exertion.  She is scheduled for a high-resolution chest CT today.  Essential hypertension: She has been monitoring her blood pressure closely  while taking prednisone.  Primary osteoarthritis of both feet: She has warmth and tenderness of the right ankle joint on exam today.  No tenderness of MTP joints.  Elevated CK: CK was 220 on 07/26/2019.  We will recheck CK and add aldolase today.  She has not been experiencing any myalgias or muscle weakness recently.  Other medical conditions are listed as follows:  History of hyperlipidemia  Hashimoto's thyroiditis - Followed by Dr. Buddy Duty.  Hyperparathyroidism, primary ([place]) - Status post resection of parathyroid tumor per patient.  History of gastroesophageal reflux (GERD)  Hx of migraines  History of miscarriage  Former smoker  Orders: Orders Placed This Encounter  Procedures  . CK  . Aldolase   No orders of the defined types were placed in this encounter.   Face-to-face time spent with patient was 30 minutes. Greater than 50% of time was spent in counseling and coordination of care.  Follow-Up Instructions: Return in about 6 weeks (around 09/28/2019) for Rheumatoid arthritis.   Ofilia Neas, PA-C  I examined and evaluated the patient with Hazel Sams PA.  Patient has noticed improvement in her symptoms on prednisone.  The plan was to start her on methotrexate today.  We will wait until we get results of high-resolution CT prior to putting her  on methotrexate.  She still continues to have some discomfort in her right ankle joint.  Patient also needed a short-term disability due to ongoing joint pain and discomfort.  The papers were filled.  The plan of care was discussed as noted above.  Bo Merino, MD  Note - This record has been created using Editor, commissioning.  Chart creation errors have been sought, but may not always  have been located. Such creation errors do not reflect on  the standard of medical care.

## 2019-08-12 ENCOUNTER — Ambulatory Visit (HOSPITAL_BASED_OUTPATIENT_CLINIC_OR_DEPARTMENT_OTHER)
Admission: RE | Admit: 2019-08-12 | Discharge: 2019-08-12 | Disposition: A | Discharge status: Home / Self Care | Payer: No Typology Code available for payment source | Source: Ambulatory Visit | Attending: Family Medicine | Admitting: Family Medicine

## 2019-08-12 ENCOUNTER — Other Ambulatory Visit: Payer: Self-pay | Admitting: Family Medicine

## 2019-08-12 ENCOUNTER — Other Ambulatory Visit: Payer: Self-pay

## 2019-08-12 DIAGNOSIS — Z780 Asymptomatic menopausal state: Secondary | ICD-10-CM | POA: Insufficient documentation

## 2019-08-12 DIAGNOSIS — E2839 Other primary ovarian failure: Secondary | ICD-10-CM | POA: Diagnosis present

## 2019-08-17 ENCOUNTER — Ambulatory Visit (INDEPENDENT_AMBULATORY_CARE_PROVIDER_SITE_OTHER)
Admission: RE | Admit: 2019-08-17 | Discharge: 2019-08-17 | Disposition: A | Discharge status: Home / Self Care | Payer: No Typology Code available for payment source | Source: Ambulatory Visit | Attending: Pulmonary Disease | Admitting: Pulmonary Disease

## 2019-08-17 ENCOUNTER — Other Ambulatory Visit: Payer: Self-pay

## 2019-08-17 ENCOUNTER — Telehealth: Payer: Self-pay

## 2019-08-17 ENCOUNTER — Encounter: Payer: Self-pay | Admitting: Rheumatology

## 2019-08-17 ENCOUNTER — Ambulatory Visit (INDEPENDENT_AMBULATORY_CARE_PROVIDER_SITE_OTHER): Payer: No Typology Code available for payment source | Admitting: Rheumatology

## 2019-08-17 VITALS — BP 147/98 | HR 80 | Resp 14 | Ht 64.0 in | Wt 209.6 lb

## 2019-08-17 DIAGNOSIS — M19072 Primary osteoarthritis, left ankle and foot: Secondary | ICD-10-CM

## 2019-08-17 DIAGNOSIS — M19041 Primary osteoarthritis, right hand: Secondary | ICD-10-CM | POA: Diagnosis not present

## 2019-08-17 DIAGNOSIS — Z79899 Other long term (current) drug therapy: Secondary | ICD-10-CM

## 2019-08-17 DIAGNOSIS — Z87891 Personal history of nicotine dependence: Secondary | ICD-10-CM

## 2019-08-17 DIAGNOSIS — Z8759 Personal history of other complications of pregnancy, childbirth and the puerperium: Secondary | ICD-10-CM

## 2019-08-17 DIAGNOSIS — Z8669 Personal history of other diseases of the nervous system and sense organs: Secondary | ICD-10-CM

## 2019-08-17 DIAGNOSIS — J849 Interstitial pulmonary disease, unspecified: Secondary | ICD-10-CM | POA: Diagnosis not present

## 2019-08-17 DIAGNOSIS — R058 Other specified cough: Secondary | ICD-10-CM

## 2019-08-17 DIAGNOSIS — M19042 Primary osteoarthritis, left hand: Secondary | ICD-10-CM

## 2019-08-17 DIAGNOSIS — Z96611 Presence of right artificial shoulder joint: Secondary | ICD-10-CM | POA: Diagnosis not present

## 2019-08-17 DIAGNOSIS — M0609 Rheumatoid arthritis without rheumatoid factor, multiple sites: Secondary | ICD-10-CM

## 2019-08-17 DIAGNOSIS — Z8719 Personal history of other diseases of the digestive system: Secondary | ICD-10-CM

## 2019-08-17 DIAGNOSIS — M19071 Primary osteoarthritis, right ankle and foot: Secondary | ICD-10-CM

## 2019-08-17 DIAGNOSIS — E210 Primary hyperparathyroidism: Secondary | ICD-10-CM

## 2019-08-17 DIAGNOSIS — R748 Abnormal levels of other serum enzymes: Secondary | ICD-10-CM

## 2019-08-17 DIAGNOSIS — I10 Essential (primary) hypertension: Secondary | ICD-10-CM

## 2019-08-17 DIAGNOSIS — R05 Cough: Secondary | ICD-10-CM

## 2019-08-17 DIAGNOSIS — E063 Autoimmune thyroiditis: Secondary | ICD-10-CM

## 2019-08-17 DIAGNOSIS — Z8639 Personal history of other endocrine, nutritional and metabolic disease: Secondary | ICD-10-CM

## 2019-08-17 NOTE — Telephone Encounter (Signed)
Per Hazel Sams, PA-C patient will be starting methotrexate 0.82ml once weekly x2 weeks then increase to 8.1XB weekly, folic acid 2mg  daily. Prescriptions pending CT results.

## 2019-08-18 ENCOUNTER — Other Ambulatory Visit: Payer: Self-pay | Admitting: Physician Assistant

## 2019-08-18 LAB — ALDOLASE: Aldolase: 7 U/L (ref <=8.1)

## 2019-08-18 LAB — CK: Total CK: 111 U/L (ref 29–143)

## 2019-08-18 NOTE — Progress Notes (Signed)
Repeat CK is normal.

## 2019-08-24 NOTE — Progress Notes (Signed)
Please send prescription for methotrexate and folic acid as described in the office note. She will be starting on methotrexate 0.6 mL subcu weekly for 2 weeks if labs are normal then we will increase it to 0.8 mL subcu weekly. Please advise a standing orders as discussed.

## 2019-08-24 NOTE — Progress Notes (Signed)
Thank you :)

## 2019-08-25 MED ORDER — METHOTREXATE SODIUM CHEMO INJECTION 50 MG/2ML: INTRAMUSCULAR | 0 refills | Status: DC

## 2019-08-25 MED ORDER — "TUBERCULIN SYRINGE 27G X 1/2"" 1 ML MISC": 12.0000 | 3 refills | Status: DC

## 2019-08-25 MED ORDER — FOLIC ACID 1 MG PO TABS: 2.0000 mg | ORAL_TABLET | Freq: Every day | ORAL | 3 refills | Status: DC

## 2019-08-25 NOTE — Telephone Encounter (Signed)
Patient advised prescription for MTX has been sent to the pharmacy. Patient reminded of lab schedule and lab hours.

## 2019-08-25 NOTE — Telephone Encounter (Signed)
HRCT showed no evidence of fibrosis or ILD.  Patient to start methotrexate and was previously extensively counseled on how to use vial and syringe MTX.  Prescription sent for methotrexate 0.6 mL subcu weekly for 2 weeks if labs are normal then we will increase it to 0.8 mL subcu weekly along with folic acid 2 mg daily.  When patient returns call please remind her of lab monitoring schedule 2,4,8weeks and then every 3 months. Standing orders placed.   Mariella Saa, PharmD, [place], CPP Clinical Specialty Pharmacist (Rheumatology and Pulmonology)  08/25/2019 8:54 AM

## 2019-08-28 ENCOUNTER — Other Ambulatory Visit: Payer: Self-pay | Admitting: Family Medicine

## 2019-09-01 ENCOUNTER — Telehealth: Payer: Self-pay | Admitting: Rheumatology

## 2019-09-01 ENCOUNTER — Ambulatory Visit: Payer: 59 | Admitting: Family Medicine

## 2019-09-01 NOTE — Telephone Encounter (Signed)
Patient states she had her first dose of MTX on 08/26/2019 and will have her second dose on 09/02/2019. Patient advised she should come to the office on 09/07/2019 to have her labs so we may advise her if she may increase her MTX for her 09/09/2019 dose. Patient verbalized understanding.

## 2019-09-01 NOTE — Telephone Encounter (Signed)
Patient called to confirm that she is not due for labwork until 09/09/19 which is 2 weeks after taking first injection on 08/26/19.  Patient requested a return call.

## 2019-09-07 ENCOUNTER — Other Ambulatory Visit: Payer: Self-pay

## 2019-09-07 DIAGNOSIS — Z79899 Other long term (current) drug therapy: Secondary | ICD-10-CM

## 2019-09-08 LAB — COMPLETE METABOLIC PANEL WITH GFR
AG Ratio: 1.6 (calc) (ref 1.0–2.5)
ALT: 26 U/L (ref 6–29)
AST: 18 U/L (ref 10–35)
Albumin: 4.2 g/dL (ref 3.6–5.1)
Alkaline phosphatase (APISO): 97 U/L (ref 37–153)
BUN: 15 mg/dL (ref 7–25)
CO2: 30 mmol/L (ref 20–32)
Calcium: 9.6 mg/dL (ref 8.6–10.4)
Chloride: 105 mmol/L (ref 98–110)
Creat: 0.62 mg/dL (ref 0.50–1.05)
GFR, Est African American: 116 mL/min/{1.73_m2} (ref >=60)
GFR, Est Non African American: 100 mL/min/{1.73_m2} (ref >=60)
Globulin: 2.7 g/dL (calc) (ref 1.9–3.7)
Glucose, Bld: 112 mg/dL — ABNORMAL HIGH (ref 65–99)
Potassium: 4.5 mmol/L (ref 3.5–5.3)
Sodium: 142 mmol/L (ref 135–146)
Total Bilirubin: 0.4 mg/dL (ref 0.2–1.2)
Total Protein: 6.9 g/dL (ref 6.1–8.1)

## 2019-09-08 LAB — CBC WITH DIFFERENTIAL/PLATELET
Absolute Monocytes: 536 cells/uL (ref 200–950)
Basophils Absolute: 29 cells/uL (ref 0–200)
Basophils Relative: 0.5 %
Eosinophils Absolute: 68 cells/uL (ref 15–500)
Eosinophils Relative: 1.2 %
HCT: 39.2 % (ref 35.0–45.0)
Hemoglobin: 12.9 g/dL (ref 11.7–15.5)
Lymphs Abs: 2001 cells/uL (ref 850–3900)
MCH: 29.2 pg (ref 27.0–33.0)
MCHC: 32.9 g/dL (ref 32.0–36.0)
MCV: 88.7 fL (ref 80.0–100.0)
MPV: 10.1 fL (ref 7.5–12.5)
Monocytes Relative: 9.4 %
Neutro Abs: 3067 cells/uL (ref 1500–7800)
Neutrophils Relative %: 53.8 %
Platelets: 275 10*3/uL (ref 140–400)
RBC: 4.42 10*6/uL (ref 3.80–5.10)
RDW: 13.5 % (ref 11.0–15.0)
Total Lymphocyte: 35.1 %
WBC: 5.7 10*3/uL (ref 3.8–10.8)

## 2019-09-08 NOTE — Progress Notes (Signed)
CBC/CMP within normal limits except blood glucose but was not fasting.  She can increase her methotrexate to 0.8 ml and obtain lab work again after next 2 doses.

## 2019-09-14 ENCOUNTER — Ambulatory Visit: Payer: 59 | Admitting: Pulmonary Disease

## 2019-09-14 ENCOUNTER — Other Ambulatory Visit (HOSPITAL_BASED_OUTPATIENT_CLINIC_OR_DEPARTMENT_OTHER): Payer: 59

## 2019-09-21 NOTE — Progress Notes (Signed)
Office Visit Note  Patient: Sandra [name]             Date of Birth: [birth year]           MRN: [id number]             PCP: [name], MD Referring: [name], MD Visit Date: 10/04/2019 Occupation: @GUAROCC @  Subjective:  Pain in both hands and both feet   History of Present Illness: Sandra [name] is a 59 y.o. female with history of seropositive rheumatoid.  She has had 5 MTX injections.  She is currently on MTX 0.8 ml sq once weekly and folic acid 2 mg po daily.  She is tolerating MTX without any injection site reactions or recent infections.  She received both covid-19 vaccines prior to starting on MTX. She has not noticed any improvement since starting on MTX.  She is having pain in both hands, both knees, and both feet. She has persistent swelling in both hands, knees, and ankle joints.  She is currently taking a prednisone taper which started at 20 mg and she is tapering by 5 mg every 4 days.     Activities of Daily Living:  Patient reports morning stiffness for several hours.   Patient Reports nocturnal pain.  Difficulty dressing/grooming: Denies Difficulty climbing stairs: Denies Difficulty getting out of chair: Denies Difficulty using hands for taps, buttons, cutlery, and/or writing: Reports  Review of Systems  Constitutional: Positive for fatigue.  HENT: Negative for mouth sores, mouth dryness and nose dryness.   Eyes: Negative for itching and dryness.  Respiratory: Negative for shortness of breath and difficulty breathing.   Cardiovascular: Negative for chest pain and palpitations.  Gastrointestinal: Negative for blood in stool, constipation and diarrhea.  Endocrine: Negative for increased urination.  Genitourinary: Negative for difficulty urinating.  Musculoskeletal: Positive for arthralgias, joint pain, joint swelling and morning stiffness. Negative for myalgias, muscle tenderness and myalgias.  Skin: Negative for color change, rash and redness.    Allergic/Immunologic: Negative for susceptible to infections.  Neurological: Negative for dizziness, numbness, headaches, memory loss and weakness.  Hematological: Positive for bruising/bleeding tendency.  Psychiatric/Behavioral: Negative for confusion.    PMFS History:  Patient Active Problem List   Diagnosis Date Noted  . Stress disorder, acute 08/01/2019  . Arthralgia 05/04/2019  . OSA (obstructive sleep apnea) 05/03/2019  . Thyroiditis 12/20/2018  . Muscle cramp 12/20/2018  . Eustachian tube dysfunction 07/27/2017  . Headache 11/25/2015  . Acute bacterial sinusitis 03/26/2015  . Hematuria 04/09/2014  . Hyperglycemia 04/06/2014  . Diarrhea 09/21/2013  . Hyperparathyroidism, primary ([place]) 07/12/2013  . Dyspnea 12/28/2012  . Hypercalcemia 12/16/2012  . H/O tobacco use, presenting hazards to health 11/29/2012  . Overactive bladder 05/31/2012  . Elevated LFTs 05/31/2012  . Obesity 05/31/2012  . Allergic rhinitis 05/31/2012  . Hoarseness 02/04/2012  . Preventative health care 02/04/2012  . SUI (stress urinary incontinence, female) 02/04/2012  . Migraine   . GERD (gastroesophageal reflux disease)   . Rotator cuff syndrome of right shoulder 06/11/2011  . Atypical chest pain 01/03/2011  . Hypertension 01/03/2011  . Hyperlipidemia 01/03/2011  . KNEE PAIN, BILATERAL 09/28/2008  . ANSERINE BURSITIS 09/28/2008  . 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 Allergic rhinitis 05/31/2012  . Chest pain    PT SAW CARDIOLOGIST DR. Burt Knack - STRESS ECHO DONE NEGATIVE - NO PROBLEM SINCE  . Chicken pox as achild  . Cough  11/29/2012   RESOLVED - IT WAS CAUSED BY LISINOPRIL  . Elevated LFTs 05/31/2012  . Eustachian tube dysfunction 07/27/2017  . GERD (gastroesophageal reflux disease)   . H/O tobacco use, presenting hazards to health 11/29/2012   Smoked 1 1/2 ppd for 20 years quit in roughtly 2007  . Headache 11/25/2015  . Hematuria 04/09/2014   . Hoarseness 02/04/2012   RESOLVED - THOUGHT TO HAVE BEEN ALLERY RELATED  . Hyperglycemia 04/06/2014  . Hyperlipidemia   . Hyperparathyroidism ([place])   . Hypertension   . Obesity, unspecified 05/31/2012  . Ocular migraine   . Overactive bladder 05/31/2012  . Preventative health care 02/04/2012  . Rapid heart beat    PT FEELS PROB RELATED TO HYPER PARATHYROIDISM  . SUI (stress urinary incontinence, female) 02/04/2012   Sees Dr Perlie Gold and they have discussed a bladder tack but so far she declines    Family History  Problem Relation Age of Onset  . Hypertension Mother 18       alive  . Renal cancer Mother        renal  . Hyperlipidemia Mother   . Osteoporosis Mother        humerus   . Macular degeneration Mother   . Tremor Mother   . Parkinson's disease Mother   . Hypertension Father 55       alive  . Atrial fibrillation Father   . Colon cancer Father        colon  . Hyperlipidemia Father   . Stroke Father   . Alzheimer's disease Father   . Hypertension Brother 64       alive  . Tremor Brother   . Hypertension Sister   . Lung cancer Maternal Grandfather        lung/ smoker  . Emphysema Maternal Grandfather   . Heart disease Paternal Grandmother   . Other Paternal Grandfather        black lung  . Heart disease Paternal Grandfather   . Asthma Son   . Eczema Son   . Healthy Son   . Healthy Son   . Heart disease Paternal Aunt   . Heart disease Paternal Uncle    Past Surgical History:  Procedure Laterality Date  . BREAST EXCISIONAL BIOPSY Left    Age 35 Fibroadenoma  . BREAST SURGERY     left breast excision of adenoma  . btl    . PARATHYROIDECTOMY Right 08/12/2013   Procedure: right inferior frozen section PARATHYROIDECTOMY;  Surgeon: Earnstine Regal, MD;  Location: [facility];  Service: General;  Laterality: Right;  . PELVIC LAPAROSCOPY  03-2004  . TOTAL SHOULDER REPLACEMENT Right 01/2019  . TUBAL LIGATION    . WISDOM TOOTH EXTRACTION  30 yrs ago   Social History    Social History Narrative  . Not on file   Immunization History  Administered Date(s) Administered  . Hepatitis A, Adult 09/04/2016, 03/27/2017  . Influenza Split 12/09/2011  . Influenza,inj,Quad PF,6+ Mos 11/29/2012, 11/01/2013, 11/20/2015, 12/29/2017, 04/03/2019  . PFIZER SARS-COV-2 Vaccination 05/20/2019, 06/15/2019  . Pneumococcal Polysaccharide-23 08/01/2019  . Tdap 06/07/2015  . Zoster Recombinat (Shingrix) 08/01/2019     Objective: Vital Signs: BP (!) 157/97 (BP Location: Left Arm, Patient Position: Sitting, Cuff Size: Normal)   Pulse 77   Resp 14   Ht 5\' 5"  (1.651 m)   Wt (!) 208 lb 12.8 oz (94.7 kg)   LMP 04/01/2011   BMI 34.75 kg/m    Physical Exam Vitals and nursing  note reviewed.  Constitutional:      Appearance: She is well-developed.  HENT:     Head: Normocephalic and atraumatic.  Eyes:     Conjunctiva/sclera: Conjunctivae normal.  Pulmonary:     Effort: Pulmonary effort is normal.  Abdominal:     General: Bowel sounds are normal.     Palpations: Abdomen is soft.  Musculoskeletal:     Cervical back: Normal range of motion.  Lymphadenopathy:     Cervical: No cervical adenopathy.  Skin:    General: Skin is warm and dry.     Capillary Refill: Capillary refill takes less than 2 seconds.  Neurological:     Mental Status: She is alert and oriented to person, place, and time.  Psychiatric:        Behavior: Behavior normal.      Musculoskeletal Exam: C-spine, thoracic spine, and lumbar spine good ROM.  Shoulder joints, elbow joints, and wrist joints have good ROM with no discomfort.  Tenderness and synovitis of the right 2nd and 3rd MCP joints.  Tenderness of all PIP joints.  Tenderness of the left 2nd and 3rd MCP joints.  Hip joints good ROM with no discomfort.  Warmth of both knee joints.  Tenderness and swelling of both ankle joints.  Tenderness of all MTP joints.   CDAI Exam: CDAI Score: -- Patient Global: --; Provider Global: -- Swollen: 5 ;  Tender: 28  Joint Exam 10/04/2019      Right  Left  MCP 2  Swollen Tender   Tender  MCP 3  Swollen Tender   Tender  MCP 4   Tender     IP   Tender   Tender  PIP 2   Tender   Tender  PIP 3   Tender   Tender  PIP 4   Tender   Tender  PIP 5   Tender   Tender  Knee  Swollen Tender  Swollen Tender  Ankle     Swollen Tender  MTP 1   Tender   Tender  MTP 2   Tender   Tender  MTP 3   Tender   Tender  MTP 4   Tender   Tender  MTP 5   Tender   Tender     Investigation: No additional findings.  Imaging: ECHOCARDIOGRAM COMPLETE  Result Date: 09/27/2019    ECHOCARDIOGRAM REPORT   Patient Name:   JAVIONNA LEDER [facility] Date of Exam: 09/23/2019 Medical Rec #:  [id number]         Height:       64.0 in Accession #:    [id number]        Weight:       209.6 lb Date of Birth:  [birth year]        BSA:          1.996 m Patient Age:    79 years          BP:           142/86 mmHg Patient Gender: F                 HR:           72 bpm. Exam Location:  [place] Procedure: 2D Echo, Cardiac Doppler and Color Doppler Indications:    R06.02 SOB; R06.9 DOE  History:        Patient has prior history of Echocardiogram examinations, most  recent 01/13/2013. Signs/Symptoms:Shortness of Breath and                 Dyspnea; Risk Factors:Sleep Apnea, Former Smoker, Hypertension                 and Dyslipidemia. Patient has experienced DOE with SOB for about                 1 year.  Sonographer:    Salvadore Dom RVT, RDCS (AE), RDMS Referring Phys: [place]  1. Left ventricular ejection fraction, by estimation, is 60 to 65%. The left ventricle has normal function. The left ventricle has no regional wall motion abnormalities. Left ventricular diastolic parameters are consistent with Grade I diastolic dysfunction (impaired relaxation).  2. Right ventricular systolic function is normal. The right ventricular size is normal. There is normal pulmonary artery systolic pressure.  3. The mitral valve is  normal in structure. Trivial mitral valve regurgitation. No evidence of mitral stenosis.  4. The aortic valve is normal in structure. Aortic valve regurgitation is not visualized. No aortic stenosis is present.  5. The inferior vena cava is normal in size with greater than 50% respiratory variability, suggesting right atrial pressure of 3 mmHg. Comparison(s): EF 65 %, negative bubble study. FINDINGS  Left Ventricle: Left ventricular ejection fraction, by estimation, is 60 to 65%. The left ventricle has normal function. The left ventricle has no regional wall motion abnormalities. The left ventricular internal cavity size was normal in size. There is  no left ventricular hypertrophy. Left ventricular diastolic parameters are consistent with Grade I diastolic dysfunction (impaired relaxation). Right Ventricle: The right ventricular size is normal. No increase in right ventricular wall thickness. Right ventricular systolic function is normal. There is normal pulmonary artery systolic pressure. The tricuspid regurgitant velocity is 2.54 m/s, and  with an assumed right atrial pressure of 10 mmHg, the estimated right ventricular systolic pressure is 93.8 mmHg. Left Atrium: Left atrial size was normal in size. Right Atrium: Right atrial size was normal in size. Pericardium: There is no evidence of pericardial effusion. Mitral Valve: The mitral valve is normal in structure. Normal mobility of the mitral valve leaflets. Trivial mitral valve regurgitation. No evidence of mitral valve stenosis. Tricuspid Valve: The tricuspid valve is normal in structure. Tricuspid valve regurgitation is trivial. No evidence of tricuspid stenosis. Aortic Valve: The aortic valve is normal in structure. Aortic valve regurgitation is not visualized. No aortic stenosis is present. Aortic valve mean gradient measures 5.0 mmHg. Aortic valve peak gradient measures 9.7 mmHg. Aortic valve area, by VTI measures 2.71 cm. Pulmonic Valve: The pulmonic  valve was normal in structure. Pulmonic valve regurgitation is not visualized. No evidence of pulmonic stenosis. Aorta: The aortic root is normal in size and structure. Venous: The inferior vena cava is normal in size with greater than 50% respiratory variability, suggesting right atrial pressure of 3 mmHg. IAS/Shunts: No atrial level shunt detected by color flow Doppler.  LEFT VENTRICLE PLAX 2D LVIDd:         4.51 cm  Diastology LVIDs:         2.58 cm  LV e' lateral:   13.50 cm/s LV PW:         1.22 cm  LV E/e' lateral: 6.3 LV IVS:        0.83 cm  LV e' medial:    6.64 cm/s LVOT diam:     2.00 cm  LV E/e' medial:  12.8 LV  SV:         83 LV SV Index:   42 LVOT Area:     3.14 cm  RIGHT VENTRICLE RV S prime:     12.60 cm/s TAPSE (M-mode): 2.6 cm LEFT ATRIUM             Index       RIGHT ATRIUM           Index LA diam:        3.20 cm 1.60 cm/m  RA Area:     17.10 cm LA Vol (A2C):   70.6 ml 35.38 ml/m RA Volume:   45.30 ml  22.70 ml/m LA Vol (A4C):   62.9 ml 31.52 ml/m LA Biplane Vol: 67.9 ml 34.02 ml/m  AORTIC VALVE                   PULMONIC VALVE AV Area (Vmax):    2.15 cm    PV Vmax:       0.59 m/s AV Area (Vmean):   2.44 cm    PV Peak grad:  1.4 mmHg AV Area (VTI):     2.71 cm AV Vmax:           156.00 cm/s AV Vmean:          99.100 cm/s AV VTI:            0.306 m AV Peak Grad:      9.7 mmHg AV Mean Grad:      5.0 mmHg LVOT Vmax:         107.00 cm/s LVOT Vmean:        77.000 cm/s LVOT VTI:          0.264 m LVOT/AV VTI ratio: 0.86  AORTA Ao Root diam: 3.50 cm Ao Asc diam:  3.30 cm Ao Arch diam: 2.8 cm MITRAL VALVE                TRICUSPID VALVE MV Area (PHT): 4.36 cm     TR Peak grad:   25.8 mmHg MV Decel Time: 174 msec     TR Vmax:        254.00 cm/s MV E velocity: 84.80 cm/s MV A velocity: 105.00 cm/s  SHUNTS MV E/A ratio:  0.81         Systemic VTI:  0.26 m                             Systemic Diam: 2.00 cm Jenne Campus MD Electronically signed by Jenne Campus MD Signature Date/Time:  09/27/2019/12:54:46 PM    Final     Recent Labs: Lab Results  Component Value Date   WBC 6.5 09/23/2019   HGB 13.5 09/23/2019   PLT 273 09/23/2019   NA 141 09/23/2019   K 4.6 09/23/2019   CL 103 09/23/2019   CO2 26 09/23/2019   GLUCOSE 127 (H) 09/23/2019   BUN 13 09/23/2019   CREATININE 0.69 09/23/2019   BILITOT 0.5 09/23/2019   ALKPHOS 110 05/03/2019   AST 20 09/23/2019   ALT 26 09/23/2019   PROT 7.1 09/23/2019   ALBUMIN 4.6 05/03/2019   CALCIUM 9.9 09/23/2019   GFRAA 112 09/23/2019   QFTBGOLDPLUS NEGATIVE 07/26/2019    Speciality Comments: No specialty comments available.  Procedures:  No procedures performed Allergies: Cefdinir, Doxycycline, Codeine, Penicillins, and Simvastatin   Assessment / Plan:     Visit Diagnoses: Rheumatoid arthritis of multiple sites  with negative rheumatoid factor (HCC) - ANA 1: 40 cytoplasmic, all other antibodies negative:  She has ongoing tenderness and synovitis in multiple joints as described above.  Tenderness and synovitis of the right 2nd and 3rd MCP joints noted.  Tenderness of all PIP joints.  Warmth and inflammation of both knee joints and both ankle joints noted.  She has tenderness of all MTP joints.  She has had 5 doses of injectable MTX.  She is currently on MTX 0.8 ml sq once weekly, folic acid 2 mg po daily, and a prednisone taper.  She is currently taking prednisone 20 mg po daily. She has not noticed any improvement since starting on MTX.  We discussed adding on Humira for combination therapy.  Indications, contraindications, potential side effects of Humira were discussed today.  All questions were addressed.  We will apply for Humira and once approved she will be scheduled for the administration of the first injection in the office.  She will continue on methotrexate and folic acid as prescribed.  A refill of methotrexate was sent to the pharmacy today.  She will follow-up in the office in 8 weeks to assess her response- Plan:  methotrexate 50 MG/2ML injection  Counseled patient that Humira is a TNF blocking agent.  Counseled patient on purpose, proper use, and adverse effects of Humira.  Reviewed the most common adverse effects including infections, headache, and injection site reactions. Discussed that there is the possibility of an increased risk of malignancy but it is not well understood if this increased risk is due to the medication or the disease state.  Advised patient to get yearly dermatology exams due to risk of skin cancer. Counseled patient that Humira should be held prior to scheduled surgery.  Counseled patient to avoid live vaccines while on Humira.  Advised patient to get annual influenza vaccine and the pneumococcal vaccine as indicated.    Reviewed the importance of regular labs while on Humira therapy.  Standing orders placed.  Provided patient with medication education material and answered all questions.  Patient consented to Humira.  Will upload consent into the media tab.  Reviewed storage instructions of Humira.  Advised initial injection must be administered in office.  Patient verbalized understanding.  Dose will be for rheumatoid arthritis Humira 40 mg every 14 days.  Prescription pending lab results and/or insurance approval.  High risk medication use -Applying for Humira 40 mg sq injections once weekly.  She will continue on Methotrexate 0.8 ml sq once weekly and folic acid 2 mg po daily.  CBC and CMP WNL on 09/23/19. She will update lab work in 1 month then every 3 months.    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 Ivin Booty, [place].  She has intermittent discomfort and nocturnal pain.  She has good ROM on exam.   Primary osteoarthritis of both hands: She has tenderness of all PIP joints.  She continues to have severe pain and stiffness in both hands.    Primary osteoarthritis of both feet: She is having discomfort in both ankle joints and both feet.  She has  tenderness of all MTP joints.  She has tenderness and inflammation of both ankle joints.   Other medical conditions are listed as follows:   Dry cough - Chest x-ray is normal.  She was evaluated by Dr. Elsworth Soho.   Elevated CK  Hashimoto's thyroiditis - Followed by Dr. Buddy Duty.  Essential hypertension  History of hyperlipidemia  Hyperparathyroidism, primary ([place]) - Status  post resection of parathyroid tumor per patient.  Hx of migraines  History of miscarriage  History of gastroesophageal reflux (GERD)  Former smoker   Orders: No orders of the defined types were placed in this encounter.  Meds ordered this encounter  Medications  . methotrexate 50 MG/2ML injection    Sig: Inject 0.8 mL under the skin once weekly.    Dispense:  10 mL    Refill:  0    Please dispense 2 mL vials with preservative    COVID-19 precautions were discussed.  Use of mask, practicing hand hygiene and social distancing was emphasized despite being fully immunized.  If she develops COVID-19 symptoms she supposed to notify us or PCP as she will need referral for COVID-19 antibody infusion.  Face-to-face time spent with patient was 30 minutes. Greater than 50% of time was spent in counseling and coordination of care.  Follow-Up Instructions: Return in 8 weeks (on 11/29/2019) for Rheumatoid arthritis, Osteoarthritis.   Bo Merino, MD   Scribed by-  Hazel Sams, PA-C  Note - This record has been created using Dragon software.  Chart creation errors have been sought, but may not always  have been located. Such creation errors do not reflect on  the standard of medical care.

## 2019-09-23 ENCOUNTER — Telehealth: Payer: Self-pay | Admitting: Rheumatology

## 2019-09-23 ENCOUNTER — Ambulatory Visit (HOSPITAL_BASED_OUTPATIENT_CLINIC_OR_DEPARTMENT_OTHER)
Admission: RE | Admit: 2019-09-23 | Discharge: 2019-09-23 | Disposition: A | Discharge status: Home / Self Care | Payer: No Typology Code available for payment source | Source: Ambulatory Visit | Attending: Family Medicine | Admitting: Family Medicine

## 2019-09-23 ENCOUNTER — Other Ambulatory Visit: Payer: Self-pay

## 2019-09-23 DIAGNOSIS — R0602 Shortness of breath: Secondary | ICD-10-CM | POA: Insufficient documentation

## 2019-09-23 DIAGNOSIS — Z79899 Other long term (current) drug therapy: Secondary | ICD-10-CM

## 2019-09-23 NOTE — Telephone Encounter (Signed)
Patient had two week labs done post start of MTX. She would like to know when she is due for next lab draw. Patient cannot remember. Please call to advise.

## 2019-09-23 NOTE — Telephone Encounter (Signed)
Attempted to contact the patient and left message to advise patient she is due for another set of labs now.

## 2019-09-24 LAB — CBC WITH DIFFERENTIAL/PLATELET
Absolute Monocytes: 507 cells/uL (ref 200–950)
Basophils Absolute: 33 cells/uL (ref 0–200)
Basophils Relative: 0.5 %
Eosinophils Absolute: 59 cells/uL (ref 15–500)
Eosinophils Relative: 0.9 %
HCT: 40.6 % (ref 35.0–45.0)
Hemoglobin: 13.5 g/dL (ref 11.7–15.5)
Lymphs Abs: 2210 cells/uL (ref 850–3900)
MCH: 29.3 pg (ref 27.0–33.0)
MCHC: 33.3 g/dL (ref 32.0–36.0)
MCV: 88.1 fL (ref 80.0–100.0)
MPV: 10.8 fL (ref 7.5–12.5)
Monocytes Relative: 7.8 %
Neutro Abs: 3692 cells/uL (ref 1500–7800)
Neutrophils Relative %: 56.8 %
Platelets: 273 10*3/uL (ref 140–400)
RBC: 4.61 10*6/uL (ref 3.80–5.10)
RDW: 13.7 % (ref 11.0–15.0)
Total Lymphocyte: 34 %
WBC: 6.5 10*3/uL (ref 3.8–10.8)

## 2019-09-24 LAB — COMPLETE METABOLIC PANEL WITH GFR
AG Ratio: 1.6 (calc) (ref 1.0–2.5)
ALT: 26 U/L (ref 6–29)
AST: 20 U/L (ref 10–35)
Albumin: 4.4 g/dL (ref 3.6–5.1)
Alkaline phosphatase (APISO): 102 U/L (ref 37–153)
BUN: 13 mg/dL (ref 7–25)
CO2: 26 mmol/L (ref 20–32)
Calcium: 9.9 mg/dL (ref 8.6–10.4)
Chloride: 103 mmol/L (ref 98–110)
Creat: 0.69 mg/dL (ref 0.50–1.05)
GFR, Est African American: 112 mL/min/{1.73_m2} (ref >=60)
GFR, Est Non African American: 97 mL/min/{1.73_m2} (ref >=60)
Globulin: 2.7 g/dL (calc) (ref 1.9–3.7)
Glucose, Bld: 127 mg/dL — ABNORMAL HIGH (ref 65–99)
Potassium: 4.6 mmol/L (ref 3.5–5.3)
Sodium: 141 mmol/L (ref 135–146)
Total Bilirubin: 0.5 mg/dL (ref 0.2–1.2)
Total Protein: 7.1 g/dL (ref 6.1–8.1)

## 2019-09-26 NOTE — Progress Notes (Signed)
CBC/CMP within normal limits except glucose but not fasting.  She should continue methotrexate 0.8 ml along with folic acid 2mg .  She is due for labs again in 1 month.

## 2019-09-27 LAB — ECHOCARDIOGRAM COMPLETE
AR max vel: 2.15 cm2
AV Area VTI: 2.71 cm2
AV Area mean vel: 2.44 cm2
AV Mean grad: 5 mmHg
AV Peak grad: 9.7 mmHg
Ao pk vel: 1.56 m/s
Area-P 1/2: 4.36 cm2
S' Lateral: 2.58 cm

## 2019-09-29 ENCOUNTER — Other Ambulatory Visit: Payer: Self-pay | Admitting: *Deleted

## 2019-09-29 MED ORDER — PREDNISONE 5 MG PO TABS
ORAL_TABLET | ORAL | 0 refills | Status: DC
Start: 2019-09-29 — End: 2019-11-09

## 2019-09-29 NOTE — Telephone Encounter (Signed)
Ok to send in prednisone taper starting at 20 mg tapering by 5 mg every 4 days.

## 2019-09-29 NOTE — Telephone Encounter (Signed)
Patient contacted the office stating she is having significant amount of pain in her feet. Patient states they are swollen as well. Patient states she is also having pain in her hands and shoulders. Patient states she is having trouble walking. Patient states she will have her fifth injection of MTX on 09/30/2019. Patient is requesting a prednisone taper. Please advise.

## 2019-09-29 NOTE — Telephone Encounter (Signed)
Patient advised prescription has been sent to the pharmacy.  

## 2019-09-30 ENCOUNTER — Ambulatory Visit: Payer: No Typology Code available for payment source | Admitting: Rheumatology

## 2019-10-04 ENCOUNTER — Telehealth: Payer: Self-pay | Admitting: Pharmacist

## 2019-10-04 ENCOUNTER — Ambulatory Visit (INDEPENDENT_AMBULATORY_CARE_PROVIDER_SITE_OTHER): Payer: No Typology Code available for payment source | Admitting: Rheumatology

## 2019-10-04 ENCOUNTER — Other Ambulatory Visit: Payer: Self-pay

## 2019-10-04 ENCOUNTER — Encounter: Payer: Self-pay | Admitting: Rheumatology

## 2019-10-04 VITALS — BP 157/97 | HR 77 | Resp 14 | Ht 65.0 in | Wt 208.8 lb

## 2019-10-04 DIAGNOSIS — Z8639 Personal history of other endocrine, nutritional and metabolic disease: Secondary | ICD-10-CM

## 2019-10-04 DIAGNOSIS — R748 Abnormal levels of other serum enzymes: Secondary | ICD-10-CM

## 2019-10-04 DIAGNOSIS — Z8719 Personal history of other diseases of the digestive system: Secondary | ICD-10-CM

## 2019-10-04 DIAGNOSIS — R05 Cough: Secondary | ICD-10-CM

## 2019-10-04 DIAGNOSIS — M19041 Primary osteoarthritis, right hand: Secondary | ICD-10-CM | POA: Diagnosis not present

## 2019-10-04 DIAGNOSIS — Z79899 Other long term (current) drug therapy: Secondary | ICD-10-CM

## 2019-10-04 DIAGNOSIS — Z87891 Personal history of nicotine dependence: Secondary | ICD-10-CM

## 2019-10-04 DIAGNOSIS — E063 Autoimmune thyroiditis: Secondary | ICD-10-CM

## 2019-10-04 DIAGNOSIS — M19071 Primary osteoarthritis, right ankle and foot: Secondary | ICD-10-CM

## 2019-10-04 DIAGNOSIS — R058 Other specified cough: Secondary | ICD-10-CM

## 2019-10-04 DIAGNOSIS — I10 Essential (primary) hypertension: Secondary | ICD-10-CM

## 2019-10-04 DIAGNOSIS — M19042 Primary osteoarthritis, left hand: Secondary | ICD-10-CM

## 2019-10-04 DIAGNOSIS — Z8759 Personal history of other complications of pregnancy, childbirth and the puerperium: Secondary | ICD-10-CM

## 2019-10-04 DIAGNOSIS — E210 Primary hyperparathyroidism: Secondary | ICD-10-CM

## 2019-10-04 DIAGNOSIS — Z8669 Personal history of other diseases of the nervous system and sense organs: Secondary | ICD-10-CM

## 2019-10-04 DIAGNOSIS — Z96611 Presence of right artificial shoulder joint: Secondary | ICD-10-CM

## 2019-10-04 DIAGNOSIS — M0609 Rheumatoid arthritis without rheumatoid factor, multiple sites: Secondary | ICD-10-CM

## 2019-10-04 DIAGNOSIS — M19072 Primary osteoarthritis, left ankle and foot: Secondary | ICD-10-CM

## 2019-10-04 MED ORDER — METHOTREXATE SODIUM CHEMO INJECTION 50 MG/2ML: INTRAMUSCULAR | 0 refills | Status: DC

## 2019-10-04 NOTE — Progress Notes (Signed)
Pharmacy Note Subjective: Patient presents today to [facility] for follow up office visit. Patient seen by the pharmacist for counseling on Humira for rheumatoid arthritis.  Prior therapy includes: failed injectable methotrexate.  Objective:  CBC    Component Value Date/Time   WBC 6.5 09/23/2019 1346   RBC 4.61 09/23/2019 1346   HGB 13.5 09/23/2019 1346   HCT 40.6 09/23/2019 1346   PLT 273 09/23/2019 1346   MCV 88.1 09/23/2019 1346   MCH 29.3 09/23/2019 1346   MCHC 33.3 09/23/2019 1346   RDW 13.7 09/23/2019 1346   LYMPHSABS 2,210 09/23/2019 1346   MONOABS 0.4 04/18/2015 0736   EOSABS 59 09/23/2019 1346   BASOSABS 33 09/23/2019 1346     CMP     Component Value Date/Time   NA 141 09/23/2019 1346   K 4.6 09/23/2019 1346   CL 103 09/23/2019 1346   CO2 26 09/23/2019 1346   GLUCOSE 127 (H) 09/23/2019 1346   BUN 13 09/23/2019 1346   CREATININE 0.69 09/23/2019 1346   CALCIUM 9.9 09/23/2019 1346   PROT 7.1 09/23/2019 1346   ALBUMIN 4.6 05/03/2019 1034   AST 20 09/23/2019 1346   ALT 26 09/23/2019 1346   ALKPHOS 110 05/03/2019 1034   BILITOT 0.5 09/23/2019 1346   GFRNONAA 97 09/23/2019 1346   GFRAA 112 09/23/2019 1346      Baseline Immunosuppressant Therapy Labs TB GOLD Quantiferon TB Gold Latest Ref Rng & Units 07/26/2019  Quantiferon TB Gold Plus NEGATIVE NEGATIVE   Hepatitis Panel Hepatitis Latest Ref Rng & Units 07/26/2019  Hep B Surface Ag NON-REACTI NON-REACTIVE  Hep B IgM NON-REACTI NON-REACTIVE  Hep C Ab NON-REACTI NON-REACTIVE  Hep C Ab NON-REACTI NON-REACTIVE  Hep A IgM NON REACTIVE -   HIV Lab Results  Component Value Date   HIV NON-REACTIVE 07/26/2019   Immunoglobulins Immunoglobulin Electrophoresis Latest Ref Rng & Units 07/26/2019  IgA  47 - 310 mg/dL 117  IgG 600 - 1,640 mg/dL 1,098  IgM 50 - 300 mg/dL 148   SPEP Serum Protein Electrophoresis Latest Ref Rng & Units 09/23/2019  Total Protein 6.1 - 8.1 g/dL 7.1  Albumin 3.8 - 4.8  g/dL -  Alpha-1 0.2 - 0.3 g/dL -  Alpha-2 0.5 - 0.9 g/dL -  Beta Globulin 0.4 - 0.6 g/dL -  Beta 2 0.2 - 0.5 g/dL -  Gamma Globulin 0.8 - 1.7 g/dL -   G6PD No results found for: G6PDH TPMT No results found for: TPMT   Chest x-ray: no acute cardiopulmonary abnormality 07/26/19  Does patient have diagnosis of heart failure?  No  Assessment/Plan:  Counseled patient that Humira is a TNF blocking agent.  Counseled patient on purpose, proper use, and adverse effects of Humira.  Reviewed the most common adverse effects including infections, headache, and injection site reactions. Discussed that there is the possibility of an increased risk of malignancy but it is not well understood if this increased risk is due to the medication or the disease state.  Advised patient to get yearly dermatology exams due to risk of skin cancer. Counseled patient that Humira should be held prior to scheduled surgery.  Counseled patient to avoid live vaccines while on Humira.  Advised patient to get annual influenza vaccine and the pneumococcal vaccine as indicated.    Reviewed the importance of regular labs while on Humira therapy.  Standing orders placed.  Provided patient with medication education material and answered all questions.  Patient consented to Humira.  Will  upload consent into the media tab.  Reviewed storage instructions of Humira.  Advised initial injection must be administered in office.  Patient verbalized understanding.  Dose will be for rheumatoid arthritis Humira 40 mg every 14 days.  Prescription pending lab results and/or insurance approval.   Mariella Saa, PharmD, BCACP, CPP Clinical Specialty Pharmacist (Rheumatology and Pulmonology)  10/04/2019 11:49 AM

## 2019-10-04 NOTE — Patient Instructions (Signed)
Standing Labs We placed an order today for your standing lab work.   Please have your standing labs drawn in 1 month then every 3 months   If possible, please have your labs drawn 2 weeks prior to your appointment so that the provider can discuss your results at your appointment.  We have open lab daily Monday through Thursday from 8:30-12:30 PM and 1:30-4:30 PM and Friday from 8:30-12:30 PM and 1:30-4:00 PM at the office of Dr. Bo Merino, [facility].   Please be advised, patients with office appointments requiring lab work will take precedents over walk-in lab work.  If possible, please come for your lab work on Monday and Friday afternoons, as you may experience shorter wait times. The office is located at [address] [place], [place], [place] [address] No appointment is necessary.   Labs are drawn by [facility]. Please bring your co-pay at the time of your lab draw.  You may receive a bill from [facility] for your lab work.  If you wish to have your labs drawn at another location, please call the office 24 hours in advance to send orders.  If you have any questions regarding directions or hours of operation,  please call [phone].   As a reminder, please drink plenty of water prior to coming for your lab work. Thanks!  Vaccines You are taking a medication(s) that can suppress your immune system.  The following immunizations are recommended: . Flu annually . Covid-19  o Please continue to wear your mask if you are on any medications that suppress your immune system o If you are on methotrexate, Cellcept (mycophenolate), Rinvoq, Morrie Sheldon, and Olumiant - Hold medication for 1 week after each vaccine dose o If you are on Orencia Subcutaneous injections - Hold medication both one week prior to and one week after the first vaccine dose (only) o If you are on Orencia IV infusions - Time vaccine administration so that the first vaccination will occur four weeks after  infusion . Pneumonia (Pneumovax 23 and Prevnar 13 spaced at least 1 year apart) . Shingrix (after age 37)  Please check with your PCP to make sure you are up to date.   Adalimumab Injection What is this medicine? ADALIMUMAB (a dal AYE mu mab) is used to treat rheumatoid and psoriatic arthritis. It is also used to treat ankylosing spondylitis, Crohn's disease, ulcerative colitis, plaque psoriasis, hidradenitis suppurativa, and uveitis. This medicine may be used for other purposes; ask your health care provider or pharmacist if you have questions. COMMON BRAND NAME(S): CYLTEZO, Humira What should I tell my health care provider before I take this medicine? They need to know if you have any of these conditions:  diabetes  heart disease  hepatitis B or history of hepatitis B infection  immune system problems  infection or history of infections  multiple sclerosis  recently received or scheduled to receive a vaccine  scheduled to have surgery  tuberculosis, a positive skin test for tuberculosis or have recently been in close contact with someone who has tuberculosis  an unusual reaction to adalimumab, other medicines, mannitol, latex, rubber, foods, dyes, or preservatives  pregnant or trying to get pregnant  breast-feeding How should I use this medicine? This medicine is for injection under the skin. You will be taught how to prepare and give this medicine. Use exactly as directed. Take your medicine at regular intervals. Do not take your medicine more often than directed. A special MedGuide will be given to  you by the pharmacist with each prescription and refill. Be sure to read this information carefully each time. It is important that you put your used needles and syringes in a special sharps container. Do not put them in a trash can. If you do not have a sharps container, call your pharmacist or healthcare provider to get one. Talk to your pediatrician regarding the use of  this medicine in children. While this drug may be prescribed for children as young as 2 years for selected conditions, precautions do apply. The manufacturer of the medicine offers free information to patients and their health care partners. Call 319-517-7314 for more information. Overdosage: If you think you have taken too much of this medicine contact a poison control center or emergency room at once. NOTE: This medicine is only for you. Do not share this medicine with others. What if I miss a dose? If you miss a dose, take it as soon as you can. If it is almost time for your next dose, take only that dose. Do not take double or extra doses. Give the next dose when your next scheduled dose is due. Call your doctor or health care professional if you are not sure how to handle a missed dose. What may interact with this medicine? Do not take this medicine with any of the following medications:  abatacept  anakinra  etanercept  infliximab  live virus vaccines  rilonacept This medicine may also interact with the following medications:  vaccines This list may not describe all possible interactions. Give your health care provider a list of all the medicines, herbs, non-prescription drugs, or dietary supplements you use. Also tell them if you smoke, drink alcohol, or use illegal drugs. Some items may interact with your medicine. What should I watch for while using this medicine? Visit your doctor or health care professional for regular checks on your progress. Tell your doctor or healthcare professional if your symptoms do not start to get better or if they get worse. You will be tested for tuberculosis (TB) before you start this medicine. If your doctor prescribes any medicine for TB, you should start taking the TB medicine before starting this medicine. Make sure to finish the full course of TB medicine. Call your doctor or health care professional if you get a cold or other infection while  receiving this medicine. Do not treat yourself. This medicine may decrease your body's ability to fight infection. Talk to your doctor about your risk of cancer. You may be more at risk for certain types of cancers if you take this medicine. What side effects may I notice from receiving this medicine? Side effects that you should report to your doctor or health care professional as soon as possible:  allergic reactions like skin rash, itching or hives, swelling of the face, lips, or tongue  breathing problems  changes in vision  chest pain  fever, chills, or any other sign of infection  numbness or tingling  red, scaly patches or raised bumps on the skin  swelling of the ankles  swollen lymph nodes in the neck, underarm, or groin areas  unexplained weight loss  unusual bleeding or bruising  unusually weak or tired Side effects that usually do not require medical attention (report to your doctor or health care professional if they continue or are bothersome):  headache  nausea  redness, itching, swelling, or bruising at site where injected This list may not describe all possible side effects. Call your doctor for  medical advice about side effects. You may report side effects to FDA at 1-800-FDA-1088. Where should I keep my medicine? Keep out of the reach of children. Store in the original container and in the refrigerator between 2 and 8 degrees C (36 and 46 degrees F). Do not freeze. The product may be stored in a cool carrier with an ice pack, if needed. Protect from light. Throw away any unused medicine after the expiration date. NOTE: This sheet is a summary. It may not cover all possible information. If you have questions about this medicine, talk to your doctor, pharmacist, or health care provider.  2020 Elsevier/Gold Standard (2017-12-14 13:22:46)

## 2019-10-04 NOTE — Telephone Encounter (Signed)
Please start BIV for Humira for the treatment of RA.  Patient had inadequate response to injectable methotrexate.   Mariella Saa, PharmD, [place], CPP Clinical Specialty Pharmacist (Rheumatology and Pulmonology)  10/04/2019 11:50 AM

## 2019-10-04 NOTE — Telephone Encounter (Signed)
Submitted a Prior Authorization request to [facility] for [place] via Cover My Meds. Will update once we receive a response.   (KeyGeraldo Docker) - [id number]

## 2019-10-06 NOTE — Telephone Encounter (Signed)
Received fax from [facility] due to electronic PA not processing. Completed and faxed to [place]. Awaiting response.

## 2019-10-11 NOTE — Telephone Encounter (Signed)
Called [facility], Humira PA is pending, should receive a response by tomorrow 10/12/19.  PA Case ID: [id number]  Phone# [phone]

## 2019-10-14 ENCOUNTER — Other Ambulatory Visit: Payer: Self-pay | Admitting: Family Medicine

## 2019-10-17 ENCOUNTER — Telehealth: Payer: Self-pay | Admitting: Rheumatology

## 2019-10-17 NOTE — Telephone Encounter (Signed)
Patient left a message requesting a call back. Patient would like to get an update on Humira approval status. Please call to advise.

## 2019-10-17 NOTE — Telephone Encounter (Signed)
Returned patient's call and advised authorization is still pending. Will let her know when we receive final determination.

## 2019-10-17 NOTE — Telephone Encounter (Signed)
[facility] to [facility] for additional information

## 2019-10-18 NOTE — Telephone Encounter (Signed)
Faxed additional clinical information to [facility].  Will update when we receive a response.   Mariella Saa, PharmD, [place], CPP Clinical Specialty Pharmacist (Rheumatology and Pulmonology)  10/18/2019 9:01 AM

## 2019-10-24 NOTE — Telephone Encounter (Addendum)
Received fax from [place] notifying that Humira was denied because patient did not meet one of the following conditions: A (the patient has been tested for either the RF or anti-CCP biomarker and the test was positive for B (the patient has been tested for all of the following biomarkers RF, anti-CCP, and C-reactive protein and/or ESR.  Looking back at her chart notes patient was tested for ESR by her PCP.  We will submit appeal letter and update once we receive a response.   Mariella Saa, PharmD, [place], CPP Clinical Specialty Pharmacist (Rheumatology and Pulmonology)  10/24/2019 9:45 AM

## 2019-10-25 ENCOUNTER — Telehealth: Payer: Self-pay | Admitting: Rheumatology

## 2019-10-25 NOTE — Telephone Encounter (Signed)
Patient called checking on the status of her Humira medication and if it has been approved.  Patient is requesting a return call.

## 2019-10-26 NOTE — Telephone Encounter (Signed)
Called and spoke to patient, advised [facility] submitted additional documentation 2 days ago and we are awaiting response. Will update once we hear from insurance.

## 2019-11-01 ENCOUNTER — Ambulatory Visit: Payer: No Typology Code available for payment source | Admitting: Pulmonary Disease

## 2019-11-02 ENCOUNTER — Encounter: Payer: Self-pay | Admitting: Pharmacist

## 2019-11-03 ENCOUNTER — Telehealth: Payer: Self-pay | Admitting: Rheumatology

## 2019-11-03 NOTE — Telephone Encounter (Signed)
Patient left a voicemail stating she was checking the status of Humira.

## 2019-11-04 NOTE — Telephone Encounter (Signed)
Patient returned call. Advised, nothing further needed.

## 2019-11-04 NOTE — Telephone Encounter (Signed)
Returned patient's call, no answer and voicemail is full. If patient calls office back, her appeal is still pending. It was submitted on 10/25/19 and takes 15 days to process. We will reach out to her once we receive a response.

## 2019-11-08 ENCOUNTER — Other Ambulatory Visit: Payer: Self-pay | Admitting: Rheumatology

## 2019-11-08 NOTE — Telephone Encounter (Signed)
Patient she is having trouble with her bilateral knees. Patient states she is having pain and swelling. Patient states all of her joints are swelling. Patient states she is feeling worse taking the MTX. Patient is waiting to start Humira. The Humira has been appealed. Patient states she is having trouble walking. Patient does not want to continue the MTX. Please advise.

## 2019-11-08 NOTE — Telephone Encounter (Signed)
Patient called stating she thinks the Methotrexate is actually making her symptoms worse.  Patient states "she took her injection on Saturday and has pain in her feet and knees to the point where she can hardly walk."  Patient states she is due to take it again on Friday and isn't sure she wants to take it anymore.  Patient is requesting a return call.

## 2019-11-08 NOTE — Telephone Encounter (Signed)
Please clarify what dose of Prednisone she is taking currently.    Also please check on the status of Humira.

## 2019-11-08 NOTE — Telephone Encounter (Signed)
Attempted to contact the patient and unable to leave a message, mailbox is full.  

## 2019-11-09 MED ORDER — PREDNISONE 5 MG PO TABS: ORAL_TABLET | ORAL | 0 refills | Status: DC

## 2019-11-09 NOTE — Addendum Note (Signed)
Addended by: Carole Binning on: 11/09/2019 10:33 AM   Modules accepted: Orders

## 2019-11-09 NOTE — Telephone Encounter (Signed)
Called [facility] to check status on Humira appeal. Per rep Clarise Cruz, Appeal is currently under review and determination should be made by end of day today.  Phone# [phone]

## 2019-11-09 NOTE — Telephone Encounter (Signed)
Received notification from [facility] regarding a prior authorization for [place]. Authorization has been APPROVED from 10/09/19 to 11/09/20.   Authorization # [id number] MV  Patient must [facility]. Patient has Pharmacist, community and can use copay card.  Please call to schedule Humira New start visit.

## 2019-11-09 NOTE — Telephone Encounter (Signed)
Ok to send in a prednisone taper starting at 20 mg tapering by 5 mg every 4 days.

## 2019-11-09 NOTE — Telephone Encounter (Signed)
Patient states she is not on Prednisone. Patient states she finished the taper 2 days after seeing Dr. Estanislado Pandy. Patient states she is stopping the MTX as she feels it is exacerbating her symptoms.

## 2019-11-09 NOTE — Telephone Encounter (Signed)
Attempted to contact the patient. No answer and unable to leave a message, mailbox is full.

## 2019-11-10 NOTE — Telephone Encounter (Signed)
Attempted to contact the patient and unable to leave a message, voicemail is full.  

## 2019-11-15 NOTE — Telephone Encounter (Signed)
Patient scheduled for a new start Humira on 11/23/2019.

## 2019-11-21 NOTE — Progress Notes (Signed)
Pharmacy Note  Subjective:   Patient presents to clinic today to receive first dose of Humira.  Patient running a fever or have signs/symptoms of infection? No  Patient currently on antibiotics for the treatment of infection? No  Patient have any upcoming invasive procedures/surgeries? No  Objective: CMP     Component Value Date/Time   NA 141 09/23/2019 1346   K 4.6 09/23/2019 1346   CL 103 09/23/2019 1346   CO2 26 09/23/2019 1346   GLUCOSE 127 (H) 09/23/2019 1346   BUN 13 09/23/2019 1346   CREATININE 0.69 09/23/2019 1346   CALCIUM 9.9 09/23/2019 1346   PROT 7.1 09/23/2019 1346   ALBUMIN 4.6 05/03/2019 1034   AST 20 09/23/2019 1346   ALT 26 09/23/2019 1346   ALKPHOS 110 05/03/2019 1034   BILITOT 0.5 09/23/2019 1346   GFRNONAA 97 09/23/2019 1346   GFRAA 112 09/23/2019 1346    CBC    Component Value Date/Time   WBC 6.5 09/23/2019 1346   RBC 4.61 09/23/2019 1346   HGB 13.5 09/23/2019 1346   HCT 40.6 09/23/2019 1346   PLT 273 09/23/2019 1346   MCV 88.1 09/23/2019 1346   MCH 29.3 09/23/2019 1346   MCHC 33.3 09/23/2019 1346   RDW 13.7 09/23/2019 1346   LYMPHSABS 2,210 09/23/2019 1346   MONOABS 0.4 04/18/2015 0736   EOSABS 59 09/23/2019 1346   BASOSABS 33 09/23/2019 1346    Baseline Immunosuppressant Therapy Labs TB GOLD Quantiferon TB Gold Latest Ref Rng & Units 07/26/2019  Quantiferon TB Gold Plus NEGATIVE NEGATIVE   Hepatitis Panel Hepatitis Latest Ref Rng & Units 07/26/2019  Hep B Surface Ag NON-REACTI NON-REACTIVE  Hep B IgM NON-REACTI NON-REACTIVE  Hep C Ab NON-REACTI NON-REACTIVE  Hep C Ab NON-REACTI NON-REACTIVE  Hep A IgM NON REACTIVE -   HIV Lab Results  Component Value Date   HIV NON-REACTIVE 07/26/2019   Immunoglobulins Immunoglobulin Electrophoresis Latest Ref Rng & Units 07/26/2019  IgA  47 - 310 mg/dL 117  IgG 600 - 1,640 mg/dL 1,098  IgM 50 - 300 mg/dL 148   SPEP Serum Protein Electrophoresis Latest Ref Rng & Units 09/23/2019  Total  Protein 6.1 - 8.1 g/dL 7.1  Albumin 3.8 - 4.8 g/dL -  Alpha-1 0.2 - 0.3 g/dL -  Alpha-2 0.5 - 0.9 g/dL -  Beta Globulin 0.4 - 0.6 g/dL -  Beta 2 0.2 - 0.5 g/dL -  Gamma Globulin 0.8 - 1.7 g/dL -   G6PD No results found for: G6PDH TPMT No results found for: TPMT   Chest x-ray: no acute cardiopulmonary abnormality 07/26/2019  Assessment/Plan:  Demonstrated proper injection technique with Humira demo pen.  Patient able to demonstrate proper injection technique using the teach back method.  Patient self injected in the lower abdomen with:  Sample Medication: Humira 40 mg/ml pen NDC: 0076-2263-33 Lot: [id number] Expiration: 03/2021  Patient tolerated well.  Observed for 30 mins in office for adverse reaction and without issue.   Patient is to return in 6-8 weeks for follow-up appointment and 1 month or labs. Standing orders placed.   [facility].    All questions encouraged and answered.  Instructed patient to call with any further questions or concerns.  Mariella Saa, PharmD, [facility] Clinical Pharmacist  11/21/2019 11:27 AM

## 2019-11-21 NOTE — Telephone Encounter (Signed)
Humira Copay card details:  Card: [id number]  Issued: 11/21/2019  Rx GROUP: [id number]  Rx BIN: [id number]  Rx PCN: [id number]  Suf: 01

## 2019-11-23 ENCOUNTER — Ambulatory Visit (INDEPENDENT_AMBULATORY_CARE_PROVIDER_SITE_OTHER): Payer: No Typology Code available for payment source | Admitting: Pharmacist

## 2019-11-23 ENCOUNTER — Other Ambulatory Visit: Payer: Self-pay

## 2019-11-23 DIAGNOSIS — M0609 Rheumatoid arthritis without rheumatoid factor, multiple sites: Secondary | ICD-10-CM | POA: Diagnosis not present

## 2019-11-23 MED ORDER — HUMIRA (2 PEN) 40 MG/0.4ML ~~LOC~~ AJKT: 40.0000 mg | AUTO-INJECTOR | SUBCUTANEOUS | 0 refills | Status: DC

## 2019-11-23 NOTE — Patient Instructions (Signed)
Remember the 5 C's:  COUNTER- leave on the counter at least 30 mins but up to overnight to bring medication to room temperature and prevent stinging  COLD- Placing something cold (like and ice gel pack or cold water bottle) on the injection site just before cleansing with alcohol may help reduce pain  CLARITIN- for the first two weeks of treatment or  the day of, the day before, and the day after injecting to minimize injection site reactions  CORTISONE CREAM- apply if injection site is irritated and itching  CALL ME- if injection site reaction is bigger than the size of your fist, looks infected, blisters, or develop hives  Standing Labs We placed an order today for your standing lab work.   Please have your standing labs drawn in 1 month and then every 3 months.  If possible, please have your labs drawn 2 weeks prior to your appointment so that the provider can discuss your results at your appointment.  We have open lab daily Monday through Thursday from 8:30-12:30 PM and 1:30-4:30 PM and Friday from 8:30-12:30 PM and 1:30-4:00 PM at the office of Dr. Bo Merino, [facility].   Please be advised, patients with office appointments requiring lab work will take precedents over walk-in lab work.  If possible, please come for your lab work on Monday and Friday afternoons, as you may experience shorter wait times. The office is located at [address], [place], [place], [place] [address] No appointment is necessary.   Labs are drawn by [facility]. Please bring your co-pay at the time of your lab draw.  You may receive a bill from [place] for your lab work.  If you wish to have your labs drawn at another location, please call the office 24 hours in advance to send orders.  If you have any questions regarding directions or hours of operation,  please call [phone].   As a reminder, please drink plenty of water prior to coming for your lab work. Thanks!

## 2019-12-01 ENCOUNTER — Telehealth: Payer: Self-pay | Admitting: Rheumatology

## 2019-12-01 NOTE — Telephone Encounter (Signed)
[facility], they claim was rejecting for days supply. Plan will only pay for 28 days at a time. Rep processed claim and copay card while I was on the phone. They will reach out to patient to schedule shipment.  Called patient and advised. [facility] will [facility] to schedule.

## 2019-12-01 NOTE — Telephone Encounter (Signed)
Patient has a question about pharmacy approval for Humira. Pharmacy has not shipped medication yet, and patient is worried about getting it on time. Next dose due this coming Wednesday. Please call to advise.

## 2019-12-02 ENCOUNTER — Encounter: Payer: Self-pay | Admitting: Pulmonary Disease

## 2019-12-02 ENCOUNTER — Other Ambulatory Visit: Payer: Self-pay

## 2019-12-02 ENCOUNTER — Ambulatory Visit (INDEPENDENT_AMBULATORY_CARE_PROVIDER_SITE_OTHER): Payer: No Typology Code available for payment source | Admitting: Pulmonary Disease

## 2019-12-02 DIAGNOSIS — G4733 Obstructive sleep apnea (adult) (pediatric): Secondary | ICD-10-CM

## 2019-12-02 DIAGNOSIS — M06031 Rheumatoid arthritis without rheumatoid factor, right wrist: Secondary | ICD-10-CM

## 2019-12-02 DIAGNOSIS — M06032 Rheumatoid arthritis without rheumatoid factor, left wrist: Secondary | ICD-10-CM

## 2019-12-02 DIAGNOSIS — M069 Rheumatoid arthritis, unspecified: Secondary | ICD-10-CM | POA: Insufficient documentation

## 2019-12-02 NOTE — Assessment & Plan Note (Signed)
No evidence of ILD. Humira is helping. Discussed that she would be eligible for Covid booster shot

## 2019-12-02 NOTE — Assessment & Plan Note (Signed)
We will obtain home sleep test results from [facility] neurology. We'll go ahead and send in prescription for auto CPAP 5 to 15 cm with nasal mask. On her follow-up visit, we will obtain CPAP download and fine-tune settings as needed

## 2019-12-02 NOTE — Progress Notes (Signed)
   Subjective:    Patient ID: Sandra [name], female    DOB: October 24, 1960, 59 y.o.   MRN: [id number]  HPI 59 year old ex-smoker for FU of shortness of breath and OSA.  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   TIA presenting as hemianopia -migraine  On her last visit, we were concerned about ILD. Her shortness of breath is better. HRCT only showed bibasal scarring without any evidence of ILD, mild emphysema  Her son is going through chemotherapy for Ewing's sarcoma. She did not tolerate methotrexate and was placed on Humira, she received the first shot 1 week ago and it has helped her arthritis.  She underwent evaluation 05/2019 home sleep test at [facility] neurology and was told that she moderate sleep apnea. She did not want to go through titration study. We were unable to get the study last time. She again reports loud snoring and increased daytime somnolence and fatigue   Significant tests/ events reviewed  HRCT 08/2019 >> Bibasilar scarring.  No evidence of interstitial lung disease.  Mild air trapping, indicative of small airways disease.mild emphysema   2014 -Spirometry no airway obstruction, FEV1 2.37-85% and ratio of 83.  Review of Systems Patient denies significant dyspnea,cough, hemoptysis,  chest pain, palpitations, pedal edema, orthopnea, paroxysmal nocturnal dyspnea, lightheadedness, nausea, vomiting, abdominal or  leg pains      Objective:   Physical Exam  Gen. Pleasant, well-nourished, in no distress ENT - no thrush, no pallor/icterus,no post nasal drip Neck: No JVD, no thyromegaly, no carotid bruits Lungs: no use of accessory muscles, no dullness to percussion, clear without rales or rhonchi  Cardiovascular: Rhythm regular, heart sounds  normal, no murmurs or gallops, no peripheral edema Musculoskeletal: No deformities, no cyanosis or clubbing         Assessment & Plan:

## 2019-12-02 NOTE — Patient Instructions (Signed)
Trial of auto CPAP 5 to 15 cm, nasal mask of choice Obtain sleep study from [facility] neurology

## 2019-12-07 ENCOUNTER — Ambulatory Visit: Payer: No Typology Code available for payment source | Admitting: Rheumatology

## 2019-12-12 ENCOUNTER — Other Ambulatory Visit: Payer: Self-pay

## 2019-12-12 ENCOUNTER — Telehealth: Payer: Self-pay | Admitting: Pulmonary Disease

## 2019-12-12 DIAGNOSIS — G4733 Obstructive sleep apnea (adult) (pediatric): Secondary | ICD-10-CM

## 2019-12-12 MED ORDER — PREDNISONE 5 MG PO TABS
ORAL_TABLET | ORAL | 0 refills | Status: DC
Start: 2019-12-12 — End: 2020-02-16

## 2019-12-12 NOTE — Telephone Encounter (Signed)
Please give prednisone taper starting at 20 mg and taper by 5 mg every week.

## 2019-12-12 NOTE — Telephone Encounter (Signed)
Patient called stating she began Humira approximately 2 weeks ago and is having pain and swelling.  Patient states she realizes it might take up to 6 weeks before she notices any results from the Humira and is requesting a prescription of Prednisone.  Patient also states she will stop by the office tomorrow to drop off paperwork to be sent to her employer.

## 2019-12-12 NOTE — Telephone Encounter (Signed)
Called and spoke Patient.  Patient was checking to make sure sleep study was faxed to Dr. Elsworth Soho from [facility]. Dr. Elsworth Soho has received received sleep study on Patient. Patient is aware Dr Elsworth Soho has sleep study from [facility], and someone will reach out to her with results and recommendations once Dr. Elsworth Soho has reviewed.  Message routed to Dr. Elsworth Soho as Juluis Rainier

## 2019-12-12 NOTE — Telephone Encounter (Signed)
Reviewed HST from [facility] 05/2019 -2 nights performed -Showed moderate OSA, AHI 23/hour, supine AHI was 80/hour.  -Please place prescription for auto CPAP 5 to 15 cm, nasal mask of choice, office visit with APP in 6 weeks to fine-tune settings

## 2019-12-13 ENCOUNTER — Ambulatory Visit (INDEPENDENT_AMBULATORY_CARE_PROVIDER_SITE_OTHER): Payer: No Typology Code available for payment source | Admitting: Family Medicine

## 2019-12-13 ENCOUNTER — Other Ambulatory Visit: Payer: Self-pay

## 2019-12-13 VITALS — BP 126/66 | HR 75 | Temp 98.0°F | Resp 13 | Ht 65.0 in | Wt 205.8 lb

## 2019-12-13 DIAGNOSIS — E6609 Other obesity due to excess calories: Secondary | ICD-10-CM

## 2019-12-13 DIAGNOSIS — R739 Hyperglycemia, unspecified: Secondary | ICD-10-CM

## 2019-12-13 DIAGNOSIS — M549 Dorsalgia, unspecified: Secondary | ICD-10-CM

## 2019-12-13 DIAGNOSIS — G4733 Obstructive sleep apnea (adult) (pediatric): Secondary | ICD-10-CM

## 2019-12-13 DIAGNOSIS — E782 Mixed hyperlipidemia: Secondary | ICD-10-CM

## 2019-12-13 DIAGNOSIS — Z23 Encounter for immunization: Secondary | ICD-10-CM | POA: Diagnosis not present

## 2019-12-13 DIAGNOSIS — M06031 Rheumatoid arthritis without rheumatoid factor, right wrist: Secondary | ICD-10-CM

## 2019-12-13 DIAGNOSIS — F32A Depression, unspecified: Secondary | ICD-10-CM

## 2019-12-13 DIAGNOSIS — I10 Essential (primary) hypertension: Secondary | ICD-10-CM

## 2019-12-13 DIAGNOSIS — F419 Anxiety disorder, unspecified: Secondary | ICD-10-CM | POA: Diagnosis not present

## 2019-12-13 DIAGNOSIS — M06032 Rheumatoid arthritis without rheumatoid factor, left wrist: Secondary | ICD-10-CM

## 2019-12-13 MED ORDER — ESCITALOPRAM OXALATE 10 MG PO TABS: 10.0000 mg | ORAL_TABLET | Freq: Every day | ORAL | 1 refills | Status: DC

## 2019-12-13 MED ORDER — ALPRAZOLAM 0.25 MG PO TABS
0.2500 mg | ORAL_TABLET | Freq: Two times a day (BID) | ORAL | 0 refills | Status: DC | PRN
Start: 2019-12-13 — End: 2020-04-09

## 2019-12-13 NOTE — Assessment & Plan Note (Addendum)
Well controlled, no changes to meds. Encouraged heart healthy diet such as the DASH diet and exercise as tolerated. Given flu shot today 

## 2019-12-13 NOTE — Telephone Encounter (Signed)
Spoke with pt. She is aware of RA's response. Order has been placed for CPAP. Nothing further was needed.

## 2019-12-13 NOTE — Assessment & Plan Note (Addendum)
Encouraged heart healthy diet, increase exercise, avoid trans fats, consider a krill oil cap daily. Will increase Atorvastatin to 40 mg qhs.

## 2019-12-13 NOTE — Assessment & Plan Note (Signed)
hgba1c acceptable, minimize simple carbs. Increase exercise as tolerated.  

## 2019-12-13 NOTE — Assessment & Plan Note (Signed)
She is ready to start a low dose lexapro. Will start Escitalopram 10 mg daily, start with 1/2 tab daily. Given Alprazolam for extreme emergenies to use prn, #10

## 2019-12-13 NOTE — Patient Instructions (Signed)
MIND diet  DASH Eating Plan DASH stands for "Dietary Approaches to Stop Hypertension." The DASH eating plan is a healthy eating plan that has been shown to reduce high blood pressure (hypertension). It may also reduce your risk for type 2 diabetes, heart disease, and stroke. The DASH eating plan may also help with weight loss. What are tips for following this plan?  General guidelines  Avoid eating more than 2,300 mg (milligrams) of salt (sodium) a day. If you have hypertension, you may need to reduce your sodium intake to 1,500 mg a day.  Limit alcohol intake to no more than 1 drink a day for nonpregnant women and 2 drinks a day for men. One drink equals 12 oz of beer, 5 oz of wine, or 1 oz of hard liquor.  Work with your health care provider to maintain a healthy body weight or to lose weight. Ask what an ideal weight is for you.  Get at least 30 minutes of exercise that causes your heart to beat faster (aerobic exercise) most days of the week. Activities may include walking, swimming, or biking.  Work with your health care provider or diet and nutrition specialist (dietitian) to adjust your eating plan to your individual calorie needs. Reading food labels   Check food labels for the amount of sodium per serving. Choose foods with less than 5 percent of the Daily Value of sodium. Generally, foods with less than 300 mg of sodium per serving fit into this eating plan.  To find whole grains, look for the word "whole" as the first word in the ingredient list. Shopping  Buy products labeled as "low-sodium" or "no salt added."  Buy fresh foods. Avoid canned foods and premade or frozen meals. Cooking  Avoid adding salt when cooking. Use salt-free seasonings or herbs instead of table salt or sea salt. Check with your health care provider or pharmacist before using salt substitutes.  Do not fry foods. Cook foods using healthy methods such as baking, boiling, grilling, and broiling  instead.  Cook with heart-healthy oils, such as olive, canola, soybean, or sunflower oil. Meal planning  Eat a balanced diet that includes: ? 5 or more servings of fruits and vegetables each day. At each meal, try to fill half of your plate with fruits and vegetables. ? Up to 6-8 servings of whole grains each day. ? Less than 6 oz of lean meat, poultry, or fish each day. A 3-oz serving of meat is about the same size as a deck of cards. One egg equals 1 oz. ? 2 servings of low-fat dairy each day. ? A serving of nuts, seeds, or beans 5 times each week. ? Heart-healthy fats. Healthy fats called Omega-3 fatty acids are found in foods such as flaxseeds and coldwater fish, like sardines, salmon, and mackerel.  Limit how much you eat of the following: ? Canned or prepackaged foods. ? Food that is high in trans fat, such as fried foods. ? Food that is high in saturated fat, such as fatty meat. ? Sweets, desserts, sugary drinks, and other foods with added sugar. ? Full-fat dairy products.  Do not salt foods before eating.  Try to eat at least 2 vegetarian meals each week.  Eat more home-cooked food and less restaurant, buffet, and fast food.  When eating at a restaurant, ask that your food be prepared with less salt or no salt, if possible. What foods are recommended? The items listed may not be a complete list. Talk with   your dietitian about what dietary choices are best for you. Grains Whole-grain or whole-wheat bread. Whole-grain or whole-wheat pasta. Brown rice. Oatmeal. Quinoa. Bulgur. Whole-grain and low-sodium cereals. Pita bread. Low-fat, low-sodium crackers. Whole-wheat flour tortillas. Vegetables Fresh or frozen vegetables (raw, steamed, roasted, or grilled). Low-sodium or reduced-sodium tomato and vegetable juice. Low-sodium or reduced-sodium tomato sauce and tomato paste. Low-sodium or reduced-sodium canned vegetables. Fruits All fresh, dried, or frozen fruit. Canned fruit in  natural juice (without added sugar). Meat and other protein foods Skinless chicken or turkey. Ground chicken or turkey. Pork with fat trimmed off. Fish and seafood. Egg whites. Dried beans, peas, or lentils. Unsalted nuts, nut butters, and seeds. Unsalted canned beans. Lean cuts of beef with fat trimmed off. Low-sodium, lean deli meat. Dairy Low-fat (1%) or fat-free (skim) milk. Fat-free, low-fat, or reduced-fat cheeses. Nonfat, low-sodium ricotta or cottage cheese. Low-fat or nonfat yogurt. Low-fat, low-sodium cheese. Fats and oils Soft margarine without trans fats. Vegetable oil. Low-fat, reduced-fat, or light mayonnaise and salad dressings (reduced-sodium). Canola, safflower, olive, soybean, and sunflower oils. Avocado. Seasoning and other foods Herbs. Spices. Seasoning mixes without salt. Unsalted popcorn and pretzels. Fat-free sweets. What foods are not recommended? The items listed may not be a complete list. Talk with your dietitian about what dietary choices are best for you. Grains Baked goods made with fat, such as croissants, muffins, or some breads. Dry pasta or rice meal packs. Vegetables Creamed or fried vegetables. Vegetables in a cheese sauce. Regular canned vegetables (not low-sodium or reduced-sodium). Regular canned tomato sauce and paste (not low-sodium or reduced-sodium). Regular tomato and vegetable juice (not low-sodium or reduced-sodium). Pickles. Olives. Fruits Canned fruit in a light or heavy syrup. Fried fruit. Fruit in cream or butter sauce. Meat and other protein foods Fatty cuts of meat. Ribs. Fried meat. Bacon. Sausage. Bologna and other processed lunch meats. Salami. Fatback. Hotdogs. Bratwurst. Salted nuts and seeds. Canned beans with added salt. Canned or smoked fish. Whole eggs or egg yolks. Chicken or turkey with skin. Dairy Whole or 2% milk, cream, and half-and-half. Whole or full-fat cream cheese. Whole-fat or sweetened yogurt. Full-fat cheese. Nondairy  creamers. Whipped toppings. Processed cheese and cheese spreads. Fats and oils Butter. Stick margarine. Lard. Shortening. Ghee. Bacon fat. Tropical oils, such as coconut, palm kernel, or palm oil. Seasoning and other foods Salted popcorn and pretzels. Onion salt, garlic salt, seasoned salt, table salt, and sea salt. Worcestershire sauce. Tartar sauce. Barbecue sauce. Teriyaki sauce. Soy sauce, including reduced-sodium. Steak sauce. Canned and packaged gravies. Fish sauce. Oyster sauce. Cocktail sauce. Horseradish that you find on the shelf. Ketchup. Mustard. Meat flavorings and tenderizers. Bouillon cubes. Hot sauce and Tabasco sauce. Premade or packaged marinades. Premade or packaged taco seasonings. Relishes. Regular salad dressings. Where to find more information:  National Heart, Lung, and Blood Institute: www.nhlbi.nih.gov  American Heart Association: www.heart.org Summary  The DASH eating plan is a healthy eating plan that has been shown to reduce high blood pressure (hypertension). It may also reduce your risk for type 2 diabetes, heart disease, and stroke.  With the DASH eating plan, you should limit salt (sodium) intake to 2,300 mg a day. If you have hypertension, you may need to reduce your sodium intake to 1,500 mg a day.  When on the DASH eating plan, aim to eat more fresh fruits and vegetables, whole grains, lean proteins, low-fat dairy, and heart-healthy fats.  Work with your health care provider or diet and nutrition specialist (dietitian) to adjust your eating   plan to your individual calorie needs. This information is not intended to replace advice given to you by your health care provider. Make sure you discuss any questions you have with your health care provider. Document Revised: 02/06/2017 Document Reviewed: 02/18/2016 Elsevier Patient Education  2020 Elsevier Inc.  

## 2019-12-13 NOTE — Assessment & Plan Note (Signed)
Has cut out processed foods and is minimizing carbohydrates. Encouraged DASH or MIND diet, decrease po intake and increase exercise as tolerated. Needs 7-8 hours of sleep nightly. Avoid trans fats, eat small, frequent meals every 4-5 hours with lean proteins, complex carbs and healthy fats. Minimize simple carbs

## 2019-12-13 NOTE — Assessment & Plan Note (Signed)
Started Humira has had 2 injections and is following with rheumatology. Pain is flared with her stress.

## 2019-12-14 ENCOUNTER — Other Ambulatory Visit: Payer: Self-pay

## 2019-12-14 LAB — CBC
HCT: 40 % (ref 35.0–45.0)
Hemoglobin: 13.6 g/dL (ref 11.7–15.5)
MCH: 30.3 pg (ref 27.0–33.0)
MCHC: 34 g/dL (ref 32.0–36.0)
MCV: 89.1 fL (ref 80.0–100.0)
MPV: 10.9 fL (ref 7.5–12.5)
Platelets: 291 10*3/uL (ref 140–400)
RBC: 4.49 10*6/uL (ref 3.80–5.10)
RDW: 14 % (ref 11.0–15.0)
WBC: 5.4 10*3/uL (ref 3.8–10.8)

## 2019-12-14 LAB — HEMOGLOBIN A1C
Hgb A1c MFr Bld: 6 % of total Hgb — ABNORMAL HIGH (ref <5.7)
Mean Plasma Glucose: 126 (calc)
eAG (mmol/L): 7 (calc)

## 2019-12-14 LAB — COMPREHENSIVE METABOLIC PANEL
AG Ratio: 1.5 (calc) (ref 1.0–2.5)
ALT: 23 U/L (ref 6–29)
AST: 22 U/L (ref 10–35)
Albumin: 4.3 g/dL (ref 3.6–5.1)
Alkaline phosphatase (APISO): 96 U/L (ref 37–153)
BUN: 18 mg/dL (ref 7–25)
CO2: 26 mmol/L (ref 20–32)
Calcium: 9.9 mg/dL (ref 8.6–10.4)
Chloride: 103 mmol/L (ref 98–110)
Creat: 0.63 mg/dL (ref 0.50–1.05)
Globulin: 2.9 g/dL (calc) (ref 1.9–3.7)
Glucose, Bld: 114 mg/dL — ABNORMAL HIGH (ref 65–99)
Potassium: 4.9 mmol/L (ref 3.5–5.3)
Sodium: 140 mmol/L (ref 135–146)
Total Bilirubin: 0.4 mg/dL (ref 0.2–1.2)
Total Protein: 7.2 g/dL (ref 6.1–8.1)

## 2019-12-14 LAB — LIPID PANEL
Cholesterol: 249 mg/dL — ABNORMAL HIGH (ref <200)
HDL: 58 mg/dL (ref >=50)
LDL Cholesterol (Calc): 167 mg/dL (calc) — ABNORMAL HIGH
Non-HDL Cholesterol (Calc): 191 mg/dL (calc) — ABNORMAL HIGH (ref <130)
Total CHOL/HDL Ratio: 4.3 (calc) (ref <5.0)
Triglycerides: 113 mg/dL (ref <150)

## 2019-12-14 LAB — TSH: TSH: 1.19 mIU/L (ref 0.40–4.50)

## 2019-12-14 MED ORDER — ATORVASTATIN CALCIUM 20 MG PO TABS: 40.0000 mg | ORAL_TABLET | Freq: Every day | ORAL | 1 refills | Status: DC

## 2019-12-14 NOTE — Progress Notes (Signed)
Patient ID: Sandra [name], female   DOB: [birth year], 59 y.o.   MRN: [id number]   Subjective:    Patient ID: Sandra [name], female    DOB: 13-Feb-1961, 59 y.o.   MRN: [id number]  Chief Complaint  Patient presents with  . 4 month followup    HPI Patient is in today for follow up on chronic medical concerns. No recent febrile illness or hospitalizations. She is very tearful and acknowledges she has been struggling with depression due to her 70 year old son's diagnosis of Ewing's Sarcoma and difficulty with his treatment. He has almost died several times and it has been overwhelming. She is ready to start medications to help with her depression. She notes anhedonia but does not endorse suicidal ideation. Denies CP/palp/SOB/HA/congestion/fevers/GI or GU c/o. Taking meds as prescribed  Past Medical History:  Diagnosis Date  . Allergic rhinitis 05/31/2012  . Chest pain    PT SAW CARDIOLOGIST DR. Burt Knack - STRESS ECHO DONE NEGATIVE - NO PROBLEM SINCE  . Chicken pox as achild  . Cough 11/29/2012   RESOLVED - IT WAS CAUSED BY LISINOPRIL  . Elevated LFTs 05/31/2012  . Eustachian tube dysfunction 07/27/2017  . GERD (gastroesophageal reflux disease)   . H/O tobacco use, presenting hazards to health 11/29/2012   Smoked 1 1/2 ppd for 20 years quit in roughtly 2007  . Headache 11/25/2015  . Hematuria 04/09/2014  . Hoarseness 02/04/2012   RESOLVED - THOUGHT TO HAVE BEEN ALLERY RELATED  . Hyperglycemia 04/06/2014  . Hyperlipidemia   . Hyperparathyroidism ([place])   . Hypertension   . Obesity, unspecified 05/31/2012  . Ocular migraine   . Overactive bladder 05/31/2012  . Preventative health care 02/04/2012  . Rapid heart beat    PT FEELS PROB RELATED TO HYPER PARATHYROIDISM  . SUI (stress urinary incontinence, female) 02/04/2012   Sees Dr Perlie Gold and they have discussed a bladder tack but so far she declines    Past Surgical History:  Procedure Laterality Date  . BREAST EXCISIONAL BIOPSY Left      Age 22 Fibroadenoma  . BREAST SURGERY     left breast excision of adenoma  . btl    . PARATHYROIDECTOMY Right 08/12/2013   Procedure: right inferior frozen section PARATHYROIDECTOMY;  Surgeon: Earnstine Regal, MD;  Location: [facility];  Service: General;  Laterality: Right;  . PELVIC LAPAROSCOPY  03-2004  . TOTAL SHOULDER REPLACEMENT Right 01/2019  . TUBAL LIGATION    . WISDOM TOOTH EXTRACTION  30 yrs ago    Family History  Problem Relation Age of Onset  . Hypertension Mother 52       alive  . Renal cancer Mother        renal  . Hyperlipidemia Mother   . Osteoporosis Mother        humerus   . Macular degeneration Mother   . Tremor Mother   . Parkinson's disease Mother   . Hypertension Father 60       alive  . Atrial fibrillation Father   . Colon cancer Father        colon  . Hyperlipidemia Father   . Stroke Father   . Alzheimer's disease Father   . Hypertension Brother 6       alive  . Tremor Brother   . Hypertension Sister   . Lung cancer Maternal Grandfather        lung/ smoker  . Emphysema Maternal Grandfather   . Heart disease Paternal Grandmother   .  Other Paternal Grandfather        black lung  . Heart disease Paternal Grandfather   . Asthma Son   . Eczema Son   . Healthy Son   . Healthy Son   . Heart disease Paternal Aunt   . Heart disease Paternal Uncle     Social History   Socioeconomic History  . Marital status: Married    Spouse name: Not on file  . Number of children: 2  . Years of education: Not on file  . Highest education level: Not on file  Occupational History  . Occupation: Programmer, multimedia: [employer]  Tobacco Use  . Smoking status: Former Smoker    Packs/day: 1.00    Years: 20.00    Pack years: 20.00    Types: Cigarettes    Quit date: 03/10/2005    Years since quitting: 14.7  . Smokeless tobacco: Never Used  Vaping Use  . Vaping Use: Never used  Substance and Sexual Activity  . Alcohol use: Yes    Comment: very rarely  .  Drug use: No  . Sexual activity: Yes    Partners: Male  Other Topics Concern  . Not on file  Social History Narrative  . Not on file   Social Determinants of Health   Financial Resource Strain:   . Difficulty of Paying Living Expenses: Not on file  Food Insecurity:   . Worried About Charity fundraiser in the Last Year: Not on file  . Ran Out of Food in the Last Year: Not on file  Transportation Needs:   . Lack of Transportation (Medical): Not on file  . Lack of Transportation (Non-Medical): Not on file  Physical Activity:   . Days of Exercise per Week: Not on file  . Minutes of Exercise per Session: Not on file  Stress:   . Feeling of Stress : Not on file  Social Connections:   . Frequency of Communication with Friends and Family: Not on file  . Frequency of Social Gatherings with Friends and Family: Not on file  . Attends Religious Services: Not on file  . Active Member of Clubs or Organizations: Not on file  . Attends Archivist Meetings: Not on file  . Marital Status: Not on file  Intimate Partner Violence:   . Fear of Current or Ex-Partner: Not on file  . Emotionally Abused: Not on file  . Physically Abused: Not on file  . Sexually Abused: Not on file    Outpatient Medications Prior to Visit  Medication Sig Dispense Refill  . Adalimumab (HUMIRA PEN) 40 MG/0.4ML PNKT Inject 40 mg into the skin every 14 (fourteen) days. 3 each 0  . aspirin 325 MG tablet Take 325 mg by mouth daily.    . Cholecalciferol (VITAMIN D PO) Take 2,000 mg by mouth daily. Vitamin D    . Co-Enzyme Q-10 30 MG CAPS Take 100 mg by mouth at bedtime.    . DOCOSAHEXAENOIC ACID PO Take 1 tablet by mouth daily.    . folic acid (FOLVITE) 1 MG tablet Take 2 tablets (2 mg total) by mouth daily. 180 tablet 3  . furosemide (LASIX) 20 MG tablet TAKE 1 TABLET BY MOUTH EVERY DAY 90 tablet 1  . irbesartan (AVAPRO) 150 MG tablet TAKE 1 TABLET BY MOUTH EVERY DAY 90 tablet 1  . lactobacillus acidophilus  (BACID) TABS tablet Take 2 tablets by mouth 3 (three) times daily.    Marland Kitchen levothyroxine (  SYNTHROID) 75 MCG tablet Take 75 mcg by mouth daily.    . metoprolol succinate (TOPROL-XL) 25 MG 24 hr tablet TAKE 1 TABLET BY MOUTH EVERY DAY 90 tablet 0  . omeprazole (PRILOSEC) 40 MG capsule TAKE 1 CAPSULE BY MOUTH EVERY DAY 90 capsule 3  . OVER THE COUNTER MEDICATION Take 1 capsule by mouth daily. Mega red- 1 capsule daily    . predniSONE (DELTASONE) 5 MG tablet Take 4 tabs po x 7 days, 3  tabs po x 7 days, 2  tabs po x 7 days, 1  tab po x 7 days, 0.5 tab po x 7 days 74 tablet 0  . verapamil (CALAN-SR) 120 MG CR tablet TAKE 1 TABLET BY MOUTH EVERYDAY AT BEDTIME 90 tablet 3  . atorvastatin (LIPITOR) 20 MG tablet TAKE 1 TABLET BY MOUTH EVERY DAY 90 tablet 1  . TUBERCULIN SYR 1CC/27GX1/2" (B-D TB SYRINGE 1CC/27GX1/2") 27G X 1/2" 1 ML MISC 12 Syringes by Does not apply route once a week. 12 each 3   No facility-administered medications prior to visit.    Allergies  Allergen Reactions  . Cefdinir Other (See Comments)    Severe Stomach cramps and diarrhea for several days.  . Doxycycline Diarrhea  . Codeine Nausea And Vomiting  . Penicillins     RASH, ITCHING  . Simvastatin     myalgias    Review of Systems  Constitutional: Negative for fever.  HENT: Negative for congestion.   Eyes: Negative for blurred vision.  Respiratory: Negative for shortness of breath.   Cardiovascular: Negative for chest pain, palpitations and leg swelling.  Gastrointestinal: Negative for abdominal pain, blood in stool and nausea.  Genitourinary: Negative for dysuria and frequency.  Musculoskeletal: Negative for falls.  Skin: Negative for rash.  Neurological: Negative for dizziness, loss of consciousness and headaches.  Endo/Heme/Allergies: Negative for environmental allergies.  Psychiatric/Behavioral: Positive for depression. The patient is nervous/anxious.        Objective:    Physical Exam  BP 126/66 (BP  Location: Right Arm, Patient Position: Sitting, Cuff Size: Small)   Pulse 75   Temp 98 F (36.7 C) (Oral)   Resp 13   Ht 5\' 5"  (1.651 m)   Wt 205 lb 12.8 oz (93.4 kg)   LMP 04/01/2011   SpO2 96%   BMI 34.25 kg/m  Wt Readings from Last 3 Encounters:  12/13/19 205 lb 12.8 oz (93.4 kg)  12/02/19 209 lb (94.8 kg)  10/04/19 (!) 208 lb 12.8 oz (94.7 kg)    Diabetic Foot Exam - Simple   No data filed     Lab Results  Component Value Date   WBC 5.4 12/13/2019   HGB 13.6 12/13/2019   HCT 40.0 12/13/2019   PLT 291 12/13/2019   GLUCOSE 114 (H) 12/13/2019   CHOL 249 (H) 12/13/2019   TRIG 113 12/13/2019   HDL 58 12/13/2019   LDLDIRECT 201.0 12/14/2015   LDLCALC 167 (H) 12/13/2019   ALT 23 12/13/2019   AST 22 12/13/2019   NA 140 12/13/2019   K 4.9 12/13/2019   CL 103 12/13/2019   CREATININE 0.63 12/13/2019   BUN 18 12/13/2019   CO2 26 12/13/2019   TSH 1.19 12/13/2019   HGBA1C 6.0 (H) 12/13/2019    Lab Results  Component Value Date   TSH 1.19 12/13/2019   Lab Results  Component Value Date   WBC 5.4 12/13/2019   HGB 13.6 12/13/2019   HCT 40.0 12/13/2019   MCV 89.1 12/13/2019  PLT 291 12/13/2019   Lab Results  Component Value Date   NA 140 12/13/2019   K 4.9 12/13/2019   CO2 26 12/13/2019   GLUCOSE 114 (H) 12/13/2019   BUN 18 12/13/2019   CREATININE 0.63 12/13/2019   BILITOT 0.4 12/13/2019   ALKPHOS 110 05/03/2019   AST 22 12/13/2019   ALT 23 12/13/2019   PROT 7.2 12/13/2019   ALBUMIN 4.6 05/03/2019   CALCIUM 9.9 12/13/2019   GFR 100.99 05/03/2019   Lab Results  Component Value Date   CHOL 249 (H) 12/13/2019   Lab Results  Component Value Date   HDL 58 12/13/2019   Lab Results  Component Value Date   LDLCALC 167 (H) 12/13/2019   Lab Results  Component Value Date   TRIG 113 12/13/2019   Lab Results  Component Value Date   CHOLHDL 4.3 12/13/2019   Lab Results  Component Value Date   HGBA1C 6.0 (H) 12/13/2019       Assessment & Plan:    Problem List Items Addressed This Visit    Hypertension    Well controlled, no changes to meds. Encouraged heart healthy diet such as the DASH diet and exercise as tolerated. Given flu shot today      Relevant Orders   CBC (Completed)   Lipid panel (Completed)   TSH (Completed)   Hyperlipidemia    Encouraged heart healthy diet, increase exercise, avoid trans fats, consider a krill oil cap daily. Will increase Atorvastatin to 40 mg qhs.       Relevant Orders   Hemoglobin A1c (Completed)   Comprehensive metabolic panel (Completed)   Lipid panel (Completed)   Obesity    Has cut out processed foods and is minimizing carbohydrates. Encouraged DASH or MIND diet, decrease po intake and increase exercise as tolerated. Needs 7-8 hours of sleep nightly. Avoid trans fats, eat small, frequent meals every 4-5 hours with lean proteins, complex carbs and healthy fats. Minimize simple carbs      Hyperglycemia    hgba1c acceptable, minimize simple carbs. Increase exercise as tolerated.       OSA (obstructive sleep apnea)    She is using CPAP now.       Anxiety and depression    She is ready to start a low dose lexapro. Will start Escitalopram 10 mg daily, start with 1/2 tab daily. Given Alprazolam for extreme emergenies to use prn, #10      Relevant Medications   escitalopram (LEXAPRO) 10 MG tablet   ALPRAZolam (XANAX) 0.25 MG tablet   Rheumatoid arthritis (HCC)    Started Humira has had 2 injections and is following with rheumatology. Pain is flared with her stress.       Back pain    Other Visit Diagnoses    Influenza vaccine administered    -  Primary   Relevant Orders   Flu Vaccine QUAD 6+ mos PF IM (Fluarix Quad PF) (Completed)      I have discontinued [place] TUBERCULIN SYR 1CC/27GX1/2". I am also having her start on escitalopram and ALPRAZolam. Additionally, I am having her maintain her Cholecalciferol (VITAMIN D PO), OVER THE COUNTER MEDICATION, lactobacillus  acidophilus, Co-Enzyme Q-10, DOCOSAHEXAENOIC ACID PO, omeprazole, furosemide, aspirin, verapamil, levothyroxine, folic acid, irbesartan, metoprolol succinate, Humira Pen, and predniSONE.  Meds ordered this encounter  Medications  . escitalopram (LEXAPRO) 10 MG tablet    Sig: Take 1 tablet (10 mg total) by mouth daily.    Dispense:  10 tablet  Refill:  1  . ALPRAZolam (XANAX) 0.25 MG tablet    Sig: Take 1 tablet (0.25 mg total) by mouth 2 (two) times daily as needed for anxiety.    Dispense:  20 tablet    Refill:  0     Penni Homans, MD

## 2019-12-14 NOTE — Progress Notes (Signed)
Called pt reported change with meds as provider ordered. ( From 20 mg Atorvastatin to 40 mg q HS) Pt understood directions and medication was sent to preferred pharmacy. Will call office for concerns

## 2019-12-14 NOTE — Assessment & Plan Note (Signed)
She is using CPAP now.

## 2019-12-19 ENCOUNTER — Other Ambulatory Visit: Payer: Self-pay | Admitting: *Deleted

## 2019-12-19 DIAGNOSIS — Z79899 Other long term (current) drug therapy: Secondary | ICD-10-CM

## 2019-12-20 LAB — CBC WITH DIFFERENTIAL/PLATELET
Absolute Monocytes: 329 cells/uL (ref 200–950)
Basophils Absolute: 47 cells/uL (ref 0–200)
Basophils Relative: 0.5 %
Eosinophils Absolute: 9 cells/uL — ABNORMAL LOW (ref 15–500)
Eosinophils Relative: 0.1 %
HCT: 40.7 % (ref 35.0–45.0)
Hemoglobin: 13.7 g/dL (ref 11.7–15.5)
Lymphs Abs: 1805 cells/uL (ref 850–3900)
MCH: 30.2 pg (ref 27.0–33.0)
MCHC: 33.7 g/dL (ref 32.0–36.0)
MCV: 89.8 fL (ref 80.0–100.0)
MPV: 10.9 fL (ref 7.5–12.5)
Monocytes Relative: 3.5 %
Neutro Abs: 7210 cells/uL (ref 1500–7800)
Neutrophils Relative %: 76.7 %
Platelets: 328 10*3/uL (ref 140–400)
RBC: 4.53 10*6/uL (ref 3.80–5.10)
RDW: 13.9 % (ref 11.0–15.0)
Total Lymphocyte: 19.2 %
WBC: 9.4 10*3/uL (ref 3.8–10.8)

## 2019-12-20 LAB — COMPLETE METABOLIC PANEL WITH GFR
AG Ratio: 1.6 (calc) (ref 1.0–2.5)
ALT: 27 U/L (ref 6–29)
AST: 22 U/L (ref 10–35)
Albumin: 4.5 g/dL (ref 3.6–5.1)
Alkaline phosphatase (APISO): 93 U/L (ref 37–153)
BUN: 17 mg/dL (ref 7–25)
CO2: 31 mmol/L (ref 20–32)
Calcium: 10.4 mg/dL (ref 8.6–10.4)
Chloride: 99 mmol/L (ref 98–110)
Creat: 0.69 mg/dL (ref 0.50–1.05)
GFR, Est African American: 112 mL/min/{1.73_m2} (ref >=60)
GFR, Est Non African American: 97 mL/min/{1.73_m2} (ref >=60)
Globulin: 2.8 g/dL (calc) (ref 1.9–3.7)
Glucose, Bld: 128 mg/dL — ABNORMAL HIGH (ref 65–99)
Potassium: 5 mmol/L (ref 3.5–5.3)
Sodium: 138 mmol/L (ref 135–146)
Total Bilirubin: 0.5 mg/dL (ref 0.2–1.2)
Total Protein: 7.3 g/dL (ref 6.1–8.1)

## 2019-12-27 ENCOUNTER — Telehealth: Payer: Self-pay | Admitting: Family Medicine

## 2019-12-27 ENCOUNTER — Other Ambulatory Visit: Payer: Self-pay

## 2019-12-27 DIAGNOSIS — F32A Depression, unspecified: Secondary | ICD-10-CM

## 2019-12-27 DIAGNOSIS — F419 Anxiety disorder, unspecified: Secondary | ICD-10-CM

## 2019-12-27 MED ORDER — ESCITALOPRAM OXALATE 10 MG PO TABS: 10.0000 mg | ORAL_TABLET | Freq: Every day | ORAL | 1 refills | Status: DC

## 2019-12-27 NOTE — Progress Notes (Signed)
Pt states she is now able to take 10 mg dose.

## 2019-12-27 NOTE — Telephone Encounter (Signed)
Changes to Rx made and sent to pharmacy

## 2019-12-27 NOTE — Telephone Encounter (Signed)
Caller name: Makenli Call back number: [phone]  Patient states she was told by Dr.Blyth to start  escitalopram 5 mg for couple weeks then go up to 10 mg. The pharmacy only dispense 10 tablet.   [id number] - [place], [address], [place] [address]  Phone:  [phone] Fax:  [phone]

## 2019-12-27 NOTE — Progress Notes (Signed)
Office Visit Note  Patient: Sandra [name]             Date of Birth: 1960-04-20           MRN: [id number]             PCP: [name], MD Referring: [name], MD Visit Date: 01/10/2020 Occupation: @GUAROCC @  Subjective:  Rheumatoid Arthritis (Bil hand and feet burning, bil knee pain)   History of Present Illness: Sandra [name] is a 59 y.o. female with history of seronegative rheumatoid arthritis.  She was a started on Humira November 23, 2019.  She stopped methotrexate as she felt that methotrexate was causing flare in her symptoms.  She has been on prednisone taper as well.  She will be finishing prednisone in the next 2 days.  She has noticed some improvement on Humira but is still continues to have a lot of discomfort in her hands and feet.  Her knee joints continue to hurt as well.  She continues to have swelling in her hands and left knee.  Activities of Daily Living:  Patient reports morning stiffness for 1 hour.   Patient Reports nocturnal pain.  Difficulty dressing/grooming: Denies Difficulty climbing stairs: Reports Difficulty getting out of chair: Denies Difficulty using hands for taps, buttons, cutlery, and/or writing: Denies  Review of Systems  Constitutional: Positive for fatigue.  HENT: Positive for mouth dryness.   Eyes: Negative for dryness.  Respiratory: Positive for shortness of breath.   Cardiovascular: Positive for swelling in legs/feet.  Gastrointestinal: Negative for constipation.  Endocrine: Positive for heat intolerance.  Genitourinary: Negative for difficulty urinating.  Musculoskeletal: Positive for arthralgias, joint pain, joint swelling and morning stiffness.  Skin: Negative for rash.  Allergic/Immunologic: Negative for susceptible to infections.  Neurological: Negative for numbness.  Hematological: Negative for bruising/bleeding tendency.  Psychiatric/Behavioral: Positive for sleep disturbance.    PMFS History:  Patient  Active Problem List   Diagnosis Date Noted  . Back pain 12/13/2019  . Rheumatoid arthritis ([place]) 12/02/2019  . Anxiety and depression 08/01/2019  . Arthralgia 05/04/2019  . OSA (obstructive sleep apnea) 05/03/2019  . Thyroiditis 12/20/2018  . Muscle cramp 12/20/2018  . Eustachian tube dysfunction 07/27/2017  . Headache 11/25/2015  . Acute bacterial sinusitis 03/26/2015  . Hematuria 04/09/2014  . Hyperglycemia 04/06/2014  . Diarrhea 09/21/2013  . Hyperparathyroidism, primary ([place]) 07/12/2013  . Dyspnea 12/28/2012  . Hypercalcemia 12/16/2012  . H/O tobacco use, presenting hazards to health 11/29/2012  . Overactive bladder 05/31/2012  . Elevated LFTs 05/31/2012  . Obesity 05/31/2012  . Allergic rhinitis 05/31/2012  . Hoarseness 02/04/2012  . Preventative health care 02/04/2012  . SUI (stress urinary incontinence, female) 02/04/2012  . Migraine   . GERD (gastroesophageal reflux disease)   . Rotator cuff syndrome of right shoulder 06/11/2011  . Atypical chest pain 01/03/2011  . Hypertension 01/03/2011  . Hyperlipidemia 01/03/2011  . KNEE PAIN, BILATERAL 09/28/2008  . ANSERINE BURSITIS 09/28/2008  . 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 Allergic rhinitis 05/31/2012  . Chest pain    PT SAW CARDIOLOGIST DR. Burt Knack - STRESS ECHO DONE NEGATIVE - NO PROBLEM SINCE  . Chicken pox as achild  . Cough 11/29/2012   RESOLVED - IT WAS CAUSED BY LISINOPRIL  . Elevated LFTs 05/31/2012  . Eustachian tube dysfunction 07/27/2017  . GERD (gastroesophageal reflux disease)   . H/O tobacco use, presenting hazards to health 11/29/2012  Smoked 1 1/2 ppd for 20 years quit in roughtly 2007  . Headache 11/25/2015  . Hematuria 04/09/2014  . Hoarseness 02/04/2012   RESOLVED - THOUGHT TO HAVE BEEN ALLERY RELATED  . Hyperglycemia 04/06/2014  . Hyperlipidemia   . Hyperparathyroidism ([place])   . Hypertension   . Obesity, unspecified 05/31/2012  . Ocular  migraine   . Overactive bladder 05/31/2012  . Preventative health care 02/04/2012  . PTSD (post-traumatic stress disorder)   . Rapid heart beat    PT FEELS PROB RELATED TO HYPER PARATHYROIDISM  . Rheumatoid arthritis ([place])   . SUI (stress urinary incontinence, female) 02/04/2012   Sees Dr Perlie Gold and they have discussed a bladder tack but so far she declines    Family History  Problem Relation Age of Onset  . Hypertension Mother 57       alive  . Renal cancer Mother        renal  . Hyperlipidemia Mother   . Osteoporosis Mother        humerus   . Macular degeneration Mother   . Tremor Mother   . Parkinson's disease Mother   . Hypertension Father 67       alive  . Atrial fibrillation Father   . Colon cancer Father        colon  . Hyperlipidemia Father   . Stroke Father   . Alzheimer's disease Father   . Hypertension Brother 47       alive  . Tremor Brother   . Hypertension Sister   . Lung cancer Maternal Grandfather        lung/ smoker  . Emphysema Maternal Grandfather   . Heart disease Paternal Grandmother   . Other Paternal Grandfather        black lung  . Heart disease Paternal Grandfather   . Asthma Son   . Eczema Son   . Healthy Son   . Healthy Son   . Heart disease Paternal Aunt   . Heart disease Paternal Uncle    Past Surgical History:  Procedure Laterality Date  . BREAST EXCISIONAL BIOPSY Left    Age 30 Fibroadenoma  . BREAST SURGERY     left breast excision of adenoma  . btl    . PARATHYROIDECTOMY Right 08/12/2013   Procedure: right inferior frozen section PARATHYROIDECTOMY;  Surgeon: Earnstine Regal, MD;  Location: [facility];  Service: General;  Laterality: Right;  . PELVIC LAPAROSCOPY  03-2004  . TOTAL SHOULDER REPLACEMENT Right 01/2019  . TUBAL LIGATION    . WISDOM TOOTH EXTRACTION  30 yrs ago   Social History   Social History Narrative  . Not on file   Immunization History  Administered Date(s) Administered  . Hepatitis A, Adult 09/04/2016,  03/27/2017  . Influenza Split 12/09/2011  . Influenza,inj,Quad PF,6+ Mos 11/29/2012, 11/01/2013, 11/20/2015, 12/29/2017, 04/03/2019, 12/13/2019  . PFIZER SARS-COV-2 Vaccination 05/20/2019, 06/15/2019  . Pneumococcal Polysaccharide-23 08/01/2019  . Tdap 06/07/2015  . Zoster Recombinat (Shingrix) 08/01/2019     Objective: Vital Signs: BP 133/84 (BP Location: Left Arm, Patient Position: Sitting, Cuff Size: Normal)   Pulse 77   Resp 16   Ht 5\' 5"  (1.651 m)   Wt 206 lb 12.8 oz (93.8 kg)   LMP 04/01/2011   BMI 34.41 kg/m    Physical Exam Vitals and nursing note reviewed.  Constitutional:      Appearance: She is well-developed.  HENT:     Head: Normocephalic and atraumatic.  Eyes:  Conjunctiva/sclera: Conjunctivae normal.  Cardiovascular:     Rate and Rhythm: Normal rate and regular rhythm.     Heart sounds: Normal heart sounds.  Pulmonary:     Effort: Pulmonary effort is normal.     Breath sounds: Normal breath sounds.  Abdominal:     General: Bowel sounds are normal.     Palpations: Abdomen is soft.  Musculoskeletal:     Cervical back: Normal range of motion.  Lymphadenopathy:     Cervical: No cervical adenopathy.  Skin:    General: Skin is warm and dry.     Capillary Refill: Capillary refill takes less than 2 seconds.  Neurological:     Mental Status: She is alert and oriented to person, place, and time.  Psychiatric:        Behavior: Behavior normal.      Musculoskeletal Exam: C-spine thoracic and lumbar spine with good range of motion.  Shoulder joints, elbow joints, wrist joints with good range of motion.  She had tenderness over MCP joints as described below.  Hip joints, knee joints, ankles with good range of motion.  She has no warmth and tenderness on palpation of her left knee joint.  She has tenderness over MTPs as described below.  CDAI Exam: CDAI Score: 5.2  Patient Global: 6 mm; Provider Global: 6 mm Swollen: 0 ; Tender: 7  Joint Exam 01/10/2020       Right  Left  MCP 2      Tender  MCP 3   Tender   Tender  Knee      Tender  MTP 2   Tender   Tender  MTP 3      Tender     Investigation: No additional findings.  Imaging: No results found.  Recent Labs: Lab Results  Component Value Date   WBC 9.4 12/19/2019   HGB 13.7 12/19/2019   PLT 328 12/19/2019   NA 138 12/19/2019   K 5.0 12/19/2019   CL 99 12/19/2019   CO2 31 12/19/2019   GLUCOSE 128 (H) 12/19/2019   BUN 17 12/19/2019   CREATININE 0.69 12/19/2019   BILITOT 0.5 12/19/2019   ALKPHOS 110 05/03/2019   AST 22 12/19/2019   ALT 27 12/19/2019   PROT 7.3 12/19/2019   ALBUMIN 4.6 05/03/2019   CALCIUM 10.4 12/19/2019   GFRAA 112 12/19/2019   QFTBGOLDPLUS NEGATIVE 07/26/2019    Speciality Comments: No specialty comments available.  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  She discontinued methotrexate as she felt that methotrexate was causing flares.  She has been on Humira since November 23, 2019.  She has noticed some improvement.  She is also on a prednisone taper which should be finishing in the next 2 days.  I detailed discussion regarding adding another medication.  Indications side effects contraindications of leflunomide were discussed at length.  Handout was given and consent was taken.  We will check labs in 2 weeks, 2 months and then every 3 months once the labs are stable.  Medication counseling:   Baseline Immunosuppressant Therapy Labs  Quantiferon TB Gold Latest Ref Rng & Units 07/26/2019  Quantiferon TB Gold Plus NEGATIVE NEGATIVE    Hepatitis Latest Ref Rng & Units 07/26/2019  Hep B Surface Ag  NON-REACTI NON-REACTIVE  Hep B IgM NON-REACTI NON-REACTIVE  Hep C Ab NON-REACTI  NON-REACTIVE  Hep C Ab NON-REACTI NON-REACTIVE  Hep A IgM NON REACTIVE -    Lab Results  Component Value Date   HIV NON-REACTIVE 07/26/2019    Immunoglobulin Electrophoresis Latest Ref Rng & Units 07/26/2019  IgA  47 - 310 mg/dL 117  IgG 600 - 1,640 mg/dL 1,098  IgM 50 - 300 mg/dL 148    Serum Protein Electrophoresis Latest Ref Rng & Units 12/19/2019  Total Protein 6.1 - 8.1 g/dL 7.3  Albumin 3.8 - 4.8 g/dL -  Alpha-1 0.2 - 0.3 g/dL -  Alpha-2 0.5 - 0.9 g/dL -  Beta Globulin 0.4 - 0.6 g/dL -  Beta 2 0.2 - 0.5 g/dL -  Gamma Globulin 0.8 - 1.7 g/dL -    No results found for: G6PDH  No results found for: TPMT   Patient was counseled on the purpose, proper use, and adverse effects of leflunomide including risk of infection, nausea/diarrhea/weight loss, increase in blood pressure, rash, hair loss, tingling in the hands and feet, and signs and symptoms of interstitial lung disease.   Also counseled on Black Box warning of liver injury and importance of avoiding alcohol while on therapy. Discussed that there is the possibility of an increased risk of malignancy but it is not well understood if this increased risk is due to the medication or the disease state.  Counseled patient to avoid live vaccines. Recommend annual influenza, Pneumovax 23, Prevnar 13, and Shingrix as indicated.   Discussed the importance of frequent monitoring of liver function and blood count.  Standing orders placed. Provided patient with educational materials on leflunomide and answered all questions.  Patient consented to [place] use, and consent will be uploaded into the media tab.     High risk medication use - Humira 40 mg sq injections qo wk,( Methotrexate 0.8 ml sq once weekly and folic acid 2 mg po daily_ dcd by pt.).  Her labs are stable.  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 Ivin Booty, [place].   Primary osteoarthritis of both hands-she has DIP and PIP  thickening which continues to cause discomfort.  Primary osteoarthritis of both feet-she has rheumatoid arthritis and osteoarthritis overlap which causes discomfort.  Essential hypertension-her blood pressure is under control.  Hashimoto's thyroiditis - Followed by Dr. Buddy Duty.  Hyperparathyroidism, primary ([place]) - Status post resection of parathyroid tumor per patient.  History of hyperlipidemia  History of miscarriage  History of gastroesophageal reflux (GERD)  Hx of migraines  Former smoker  Anxiety and depression-she is going through a lot of stress as her son was diagnosed with having sarcoma and undergoing chemotherapy after surgery note.  He had nerve complications from surgery.  Educated about COVID-19 virus infection-she is fully immunized against COVID-19.  She plans to get a booster next week.  Instructions were placed in the AVS per ACR guidelines.  Use of mask, social distancing and handout was discussed.  Use of monoclonal antibodies were discussed and patient develops COVID-19 infection.  Orders: No orders of the defined types were placed in this encounter.  Meds ordered this encounter  Medications  . leflunomide (ARAVA) 20 MG tablet    Sig: Take 1 tablet (20 mg total) by mouth daily.    Dispense:  30 tablet    Refill:  2       Follow-Up Instructions: Return in about 6 weeks (around 02/21/2020) for Rheumatoid arthritis.  Bo Merino, MD  Note - This record has been created using Editor, commissioning.  Chart creation errors have been sought, but may not always  have been located. Such creation errors do not reflect on  the standard of medical care.

## 2019-12-29 ENCOUNTER — Other Ambulatory Visit: Payer: Self-pay | Admitting: Family Medicine

## 2020-01-06 ENCOUNTER — Other Ambulatory Visit: Payer: Self-pay | Admitting: Physician Assistant

## 2020-01-09 ENCOUNTER — Telehealth: Payer: Self-pay | Admitting: Family Medicine

## 2020-01-09 NOTE — Telephone Encounter (Signed)
Patient state insurance require a 90 day rx  escitalopram (LEXAPRO) 10 MG tablet [[id number]]    [id number] - [place], [place] [address], [place] [address]  Phone:  [phone] Fax:  [phone]

## 2020-01-10 ENCOUNTER — Other Ambulatory Visit: Payer: Self-pay

## 2020-01-10 ENCOUNTER — Ambulatory Visit (INDEPENDENT_AMBULATORY_CARE_PROVIDER_SITE_OTHER): Payer: No Typology Code available for payment source | Admitting: Rheumatology

## 2020-01-10 ENCOUNTER — Other Ambulatory Visit: Payer: Self-pay | Admitting: *Deleted

## 2020-01-10 ENCOUNTER — Encounter: Payer: Self-pay | Admitting: Rheumatology

## 2020-01-10 VITALS — BP 133/84 | HR 77 | Resp 16 | Ht 65.0 in | Wt 206.8 lb

## 2020-01-10 DIAGNOSIS — M19072 Primary osteoarthritis, left ankle and foot: Secondary | ICD-10-CM

## 2020-01-10 DIAGNOSIS — M19041 Primary osteoarthritis, right hand: Secondary | ICD-10-CM | POA: Diagnosis not present

## 2020-01-10 DIAGNOSIS — M0609 Rheumatoid arthritis without rheumatoid factor, multiple sites: Secondary | ICD-10-CM

## 2020-01-10 DIAGNOSIS — R748 Abnormal levels of other serum enzymes: Secondary | ICD-10-CM

## 2020-01-10 DIAGNOSIS — Z96611 Presence of right artificial shoulder joint: Secondary | ICD-10-CM | POA: Diagnosis not present

## 2020-01-10 DIAGNOSIS — F32A Depression, unspecified: Secondary | ICD-10-CM

## 2020-01-10 DIAGNOSIS — M19071 Primary osteoarthritis, right ankle and foot: Secondary | ICD-10-CM

## 2020-01-10 DIAGNOSIS — Z8719 Personal history of other diseases of the digestive system: Secondary | ICD-10-CM

## 2020-01-10 DIAGNOSIS — M19042 Primary osteoarthritis, left hand: Secondary | ICD-10-CM

## 2020-01-10 DIAGNOSIS — F419 Anxiety disorder, unspecified: Secondary | ICD-10-CM

## 2020-01-10 DIAGNOSIS — Z87891 Personal history of nicotine dependence: Secondary | ICD-10-CM

## 2020-01-10 DIAGNOSIS — I10 Essential (primary) hypertension: Secondary | ICD-10-CM

## 2020-01-10 DIAGNOSIS — E063 Autoimmune thyroiditis: Secondary | ICD-10-CM

## 2020-01-10 DIAGNOSIS — Z8669 Personal history of other diseases of the nervous system and sense organs: Secondary | ICD-10-CM

## 2020-01-10 DIAGNOSIS — E210 Primary hyperparathyroidism: Secondary | ICD-10-CM

## 2020-01-10 DIAGNOSIS — Z8759 Personal history of other complications of pregnancy, childbirth and the puerperium: Secondary | ICD-10-CM

## 2020-01-10 DIAGNOSIS — Z8639 Personal history of other endocrine, nutritional and metabolic disease: Secondary | ICD-10-CM

## 2020-01-10 DIAGNOSIS — Z79899 Other long term (current) drug therapy: Secondary | ICD-10-CM | POA: Diagnosis not present

## 2020-01-10 DIAGNOSIS — Z7189 Other specified counseling: Secondary | ICD-10-CM

## 2020-01-10 MED ORDER — LEFLUNOMIDE 20 MG PO TABS: 20.0000 mg | ORAL_TABLET | Freq: Every day | ORAL | 2 refills | Status: DC

## 2020-01-10 MED ORDER — ESCITALOPRAM OXALATE 10 MG PO TABS: 10.0000 mg | ORAL_TABLET | Freq: Every day | ORAL | 0 refills | Status: DC

## 2020-01-10 NOTE — Patient Instructions (Addendum)
Leflunomide tablets What is this medicine? LEFLUNOMIDE (le FLOO na mide) is for rheumatoid arthritis. This medicine may be used for other purposes; ask your health care provider or pharmacist if you have questions. COMMON BRAND NAME(S): Arava What should I tell my health care provider before I take this medicine? They need to know if you have any of these conditions:  diabetes  have a fever or infection  high blood pressure  immune system problems  kidney disease  liver disease  low blood cell counts, like low white cell, platelet, or red cell counts  lung or breathing disease, like asthma  recently received or scheduled to receive a vaccine  receiving treatment for cancer  skin conditions or sensitivity  tingling of the fingers or toes, or other nerve disorder  tuberculosis  an unusual or allergic reaction to leflunomide, teriflunomide, other medicines, food, dyes, or preservatives  pregnant or trying to get pregnant  breast-feeding How should I use this medicine? Take this medicine by mouth with a full glass of water. Follow the directions on the prescription label. Take your medicine at regular intervals. Do not take your medicine more often than directed. Do not stop taking except on your doctor's advice. Talk to your pediatrician regarding the use of this medicine in children. Special care may be needed. Overdosage: If you think you have taken too much of this medicine contact a poison control center or emergency room at once. NOTE: This medicine is only for you. Do not share this medicine with others. What if I miss a dose? If you miss a dose, take it as soon as you can. If it is almost time for your next dose, take only that dose. Do not take double or extra doses. What may interact with this medicine? Do not take this medicine with any of the following medications:  teriflunomide This medicine may also interact with the following  medications:  alosetron  birth control pills  caffeine  cefaclor  certain medicines for diabetes like nateglinide, repaglinide, rosiglitazone, pioglitazone  certain medicines for high cholesterol like atorvastatin, pravastatin, rosuvastatin, simvastatin  charcoal  cholestyramine  ciprofloxacin  duloxetine  furosemide  ketoprofen  live virus vaccines  medicines that increase your risk for infection  methotrexate  mitoxantrone  paclitaxel  penicillin  theophylline  tizanidine  warfarin This list may not describe all possible interactions. Give your health care provider a list of all the medicines, herbs, non-prescription drugs, or dietary supplements you use. Also tell them if you smoke, drink alcohol, or use illegal drugs. Some items may interact with your medicine. What should I watch for while using this medicine? Visit your health care provider for regular checks on your progress. Tell your doctor or health care provider if your symptoms do not start to get better or if they get worse. You may need blood work done while you are taking this medicine. This medicine may cause serious skin reactions. They can happen weeks to months after starting the medicine. Contact your health care provider right away if you notice fevers or flu-like symptoms with a rash. The rash may be red or purple and then turn into blisters or peeling of the skin. Or, you might notice a red rash with swelling of the face, lips or lymph nodes in your neck or under your arms. This medicine may stay in your body for up to 2 years after your last dose. Tell your doctor about any unusual side effects or symptoms. A medicine can  be given to help lower your blood levels of this medicine more quickly. Women must use effective birth control with this medicine. There is a potential for serious side effects to an unborn child. Do not become pregnant while taking this medicine. Inform your doctor if you wish  to become pregnant. This medicine remains in your blood after you stop taking it. You must continue using effective birth control until the blood levels have been checked and they are low enough. A medicine can be given to help lower your blood levels of this medicine more quickly. Immediately talk to your doctor if you think you may be pregnant. You may need a pregnancy test. Talk to your health care provider or pharmacist for more information. You should not receive certain vaccines during your treatment and for a certain time after your treatment with this medication ends. Talk to your health care provider for more information. What side effects may I notice from receiving this medicine? Side effects that you should report to your doctor or health care professional as soon as possible:  allergic reactions like skin rash, itching or hives, swelling of the face, lips, or tongue  breathing problems  cough  increased blood pressure  low blood counts - this medicine may decrease the number of white blood cells and platelets. You may be at increased risk for infections and bleeding.  pain, tingling, numbness in the hands or feet  rash, fever, and swollen lymph nodes  redness, blistering, peeing or loosening of the skin, including inside the mouth  signs of decreased platelets or bleeding - bruising, pinpoint red spots on the skin, black, tarry stools, blood in urine  signs of infection - fever or chills, cough, sore throat, pain or trouble passing urine  signs and symptoms of liver injury like dark yellow or brown urine; general ill feeling or flu-like symptoms; light-colored stools; loss of appetite; nausea; right upper belly pain; unusually weak or tired; yellowing of the eyes or skin  trouble passing urine or change in the amount of urine  vomiting Side effects that usually do not require medical attention (report to your doctor or health care professional if they continue or are  bothersome):  diarrhea  hair thinning or loss  headache  nausea  tiredness This list may not describe all possible side effects. Call your doctor for medical advice about side effects. You may report side effects to FDA at 1-800-FDA-1088. Where should I keep my medicine? Keep out of the reach of children. Store at room temperature between 15 and 30 degrees C (59 and 86 degrees F). Protect from moisture and light. Throw away any unused medicine after the expiration date. NOTE: This sheet is a summary. It may not cover all possible information. If you have questions about this medicine, talk to your doctor, pharmacist, or health care provider.  2020 Elsevier/Gold Standard (2018-05-28 15:06:48)  Standing Labs We placed an order today for your standing lab work.   Please have your standing labs drawn in 2 weeks, 2 months and then every 3 months  If possible, please have your labs drawn 2 weeks prior to your appointment so that the provider can discuss your results at your appointment.  We have open lab daily Monday through Thursday from 8:30-12:30 PM and 1:30-4:30 PM and Friday from 8:30-12:30 PM and 1:30-4:00 PM at the office of Dr. Bo Merino, [facility].   Please be advised, patients with office appointments requiring lab work will take precedents over  walk-in lab work.  If possible, please come for your lab work on Monday and Friday afternoons, as you may experience shorter wait times. The office is located at [address] [place], [place], [place] [address] No appointment is necessary.   Labs are drawn by [facility]. Please bring your co-pay at the time of your lab draw.  You may receive a bill from [facility] for your lab work.  If you wish to have your labs drawn at another location, please call the office 24 hours in advance to send orders.  If you have any questions regarding directions or hours of operation,  please call [phone].   As a reminder,  please drink plenty of water prior to coming for your lab work. Thanks!   COVID-19 vaccine recommendations:   COVID-19 vaccine is recommended for everyone (unless you are allergic to a vaccine component), even if you are on a medication that suppresses your immune system.   If you are on Methotrexate, Cellcept (mycophenolate), Rinvoq, Morrie Sheldon, and Olumiant- hold the medication for 1 week after each vaccine. Hold Methotrexate for 2 weeks after the single dose COVID-19 vaccine.   If you are on Orencia subcutaneous injection - hold medication one week prior to and one week after the first COVID-19 vaccine dose (only).   If you are on Orencia IV infusions- time vaccination administration so that the first COVID-19 vaccination will occur four weeks after the infusion and postpone the subsequent infusion by one week.   If you are on Cyclophosphamide or Rituxan infusions please contact your doctor prior to receiving the COVID-19 vaccine.   Do not take Tylenol or any anti-inflammatory medications (NSAIDs) 24 hours prior to the COVID-19 vaccination.   There is no direct evidence about the efficacy of the COVID-19 vaccine in individuals who are on medications that suppress the immune system.   Even if you are fully vaccinated, and you are on any medications that suppress your immune system, please continue to wear a mask, maintain at least six feet social distance and practice hand hygiene.   If you develop a COVID-19 infection, please contact your PCP or our office to determine if you need monoclonal antibody infusion.  The booster vaccine is now available for immunocompromised patients.   Please see the following web sites for updated information.   [email or web address]

## 2020-01-10 NOTE — Telephone Encounter (Signed)
Medication has been updated

## 2020-01-24 ENCOUNTER — Other Ambulatory Visit: Payer: Self-pay | Admitting: Radiology

## 2020-01-24 DIAGNOSIS — Z79899 Other long term (current) drug therapy: Secondary | ICD-10-CM

## 2020-01-25 ENCOUNTER — Other Ambulatory Visit: Payer: Self-pay | Admitting: Family Medicine

## 2020-01-25 LAB — COMPLETE METABOLIC PANEL WITH GFR
AG Ratio: 1.6 (calc) (ref 1.0–2.5)
ALT: 30 U/L — ABNORMAL HIGH (ref 6–29)
AST: 24 U/L (ref 10–35)
Albumin: 4.6 g/dL (ref 3.6–5.1)
Alkaline phosphatase (APISO): 103 U/L (ref 37–153)
BUN: 14 mg/dL (ref 7–25)
CO2: 28 mmol/L (ref 20–32)
Calcium: 10.4 mg/dL (ref 8.6–10.4)
Chloride: 101 mmol/L (ref 98–110)
Creat: 0.74 mg/dL (ref 0.50–1.05)
GFR, Est African American: 103 mL/min/{1.73_m2} (ref >=60)
GFR, Est Non African American: 89 mL/min/{1.73_m2} (ref >=60)
Globulin: 2.9 g/dL (calc) (ref 1.9–3.7)
Glucose, Bld: 89 mg/dL (ref 65–139)
Potassium: 5.2 mmol/L (ref 3.5–5.3)
Sodium: 139 mmol/L (ref 135–146)
Total Bilirubin: 0.3 mg/dL (ref 0.2–1.2)
Total Protein: 7.5 g/dL (ref 6.1–8.1)

## 2020-01-25 LAB — CBC WITH DIFFERENTIAL/PLATELET
Absolute Monocytes: 638 cells/uL (ref 200–950)
Basophils Absolute: 40 cells/uL (ref 0–200)
Basophils Relative: 0.7 %
Eosinophils Absolute: 80 cells/uL (ref 15–500)
Eosinophils Relative: 1.4 %
HCT: 43.3 % (ref 35.0–45.0)
Hemoglobin: 14.4 g/dL (ref 11.7–15.5)
Lymphs Abs: 2514 cells/uL (ref 850–3900)
MCH: 29.9 pg (ref 27.0–33.0)
MCHC: 33.3 g/dL (ref 32.0–36.0)
MCV: 89.8 fL (ref 80.0–100.0)
MPV: 10.7 fL (ref 7.5–12.5)
Monocytes Relative: 11.2 %
Neutro Abs: 2428 cells/uL (ref 1500–7800)
Neutrophils Relative %: 42.6 %
Platelets: 279 10*3/uL (ref 140–400)
RBC: 4.82 10*6/uL (ref 3.80–5.10)
RDW: 13.1 % (ref 11.0–15.0)
Total Lymphocyte: 44.1 %
WBC: 5.7 10*3/uL (ref 3.8–10.8)

## 2020-01-26 ENCOUNTER — Other Ambulatory Visit: Payer: Self-pay | Admitting: Rheumatology

## 2020-01-26 DIAGNOSIS — M0609 Rheumatoid arthritis without rheumatoid factor, multiple sites: Secondary | ICD-10-CM

## 2020-01-26 NOTE — Telephone Encounter (Signed)
Last Visit: 01/10/2020 Next Visit: 02/21/2020 Labs: 01/24/2020 ALT 30 TB Gold: 07/26/2019 Neg   Current Dose per office note 01/10/2020: Humira 40 mg sq injections qo wk  DX: Rheumatoid arthritis of multiple sites with negative rheumatoid factor   Okay to refill Humira?

## 2020-02-06 ENCOUNTER — Other Ambulatory Visit: Payer: Self-pay | Admitting: Family Medicine

## 2020-02-07 MED ORDER — LEVOTHYROXINE SODIUM 75 MCG PO TABS
75.0000 ug | ORAL_TABLET | Freq: Every day | ORAL | 0 refills | Status: DC
Start: 2020-02-07 — End: 2021-09-26

## 2020-02-07 NOTE — Progress Notes (Deleted)
Office Visit Note  Patient: Sandra [name]             Date of Birth: 1960/10/14           MRN: [id number]             PCP: [name], MD Referring: [name], MD Visit Date: 02/21/2020 Occupation: @GUAROCC @  Subjective:    History of Present Illness: Sandra [name] is a 59 y.o. female with history of seronegative rheumatoid arthritis and osteoarthritis. She is on humira 40 mg sq injections every 14 days and arava 20 mg po daily.   Activities of Daily Living:  Patient reports morning stiffness for   {minute/hour:19697}.   Patient {ACTIONS;DENIES/REPORTS:21021675::"Denies"} nocturnal pain.  Difficulty dressing/grooming: {ACTIONS;DENIES/REPORTS:21021675::"Denies"} Difficulty climbing stairs: {ACTIONS;DENIES/REPORTS:21021675::"Denies"} Difficulty getting out of chair: {ACTIONS;DENIES/REPORTS:21021675::"Denies"} Difficulty using hands for taps, buttons, cutlery, and/or writing: {ACTIONS;DENIES/REPORTS:21021675::"Denies"}  Review of Systems  Constitutional: Negative for fatigue.  HENT: Negative for mouth sores, mouth dryness and nose dryness.   Eyes: Negative for pain, visual disturbance and dryness.  Respiratory: Negative for cough, hemoptysis, shortness of breath and difficulty breathing.   Cardiovascular: Negative for chest pain, palpitations, hypertension and swelling in legs/feet.  Gastrointestinal: Negative for blood in stool, constipation and diarrhea.  Endocrine: Negative for increased urination.  Genitourinary: Negative for painful urination.  Musculoskeletal: Negative for arthralgias, joint pain, joint swelling, myalgias, muscle weakness, morning stiffness, muscle tenderness and myalgias.  Skin: Negative for color change, pallor, rash, hair loss, nodules/bumps, skin tightness, ulcers and sensitivity to sunlight.  Allergic/Immunologic: Negative for susceptible to infections.  Neurological: Negative for dizziness, numbness, headaches and weakness.   Hematological: Negative for swollen glands.  Psychiatric/Behavioral: Negative for depressed mood and sleep disturbance. The patient is not nervous/anxious.     PMFS History:  Patient Active Problem List   Diagnosis Date Noted  . Back pain 12/13/2019  . Rheumatoid arthritis ([place]) 12/02/2019  . Anxiety and depression 08/01/2019  . Arthralgia 05/04/2019  . OSA (obstructive sleep apnea) 05/03/2019  . Thyroiditis 12/20/2018  . Muscle cramp 12/20/2018  . Eustachian tube dysfunction 07/27/2017  . Headache 11/25/2015  . Acute bacterial sinusitis 03/26/2015  . Hematuria 04/09/2014  . Hyperglycemia 04/06/2014  . Diarrhea 09/21/2013  . Hyperparathyroidism, primary ([place]) 07/12/2013  . Dyspnea 12/28/2012  . Hypercalcemia 12/16/2012  . H/O tobacco use, presenting hazards to health 11/29/2012  . Overactive bladder 05/31/2012  . Elevated LFTs 05/31/2012  . Obesity 05/31/2012  . Allergic rhinitis 05/31/2012  . Hoarseness 02/04/2012  . Preventative health care 02/04/2012  . SUI (stress urinary incontinence, female) 02/04/2012  . Migraine   . GERD (gastroesophageal reflux disease)   . Rotator cuff syndrome of right shoulder 06/11/2011  . Atypical chest pain 01/03/2011  . Hypertension 01/03/2011  . Hyperlipidemia 01/03/2011  . KNEE PAIN, BILATERAL 09/28/2008  . ANSERINE BURSITIS 09/28/2008  . 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 Allergic rhinitis 05/31/2012  . Chest pain    PT SAW CARDIOLOGIST DR. Burt Knack - STRESS ECHO DONE NEGATIVE - NO PROBLEM SINCE  . Chicken pox as achild  . Cough 11/29/2012   RESOLVED - IT WAS CAUSED BY LISINOPRIL  . Elevated LFTs 05/31/2012  . Eustachian tube dysfunction 07/27/2017  . GERD (gastroesophageal reflux disease)   . H/O tobacco use, presenting hazards to health 11/29/2012   Smoked 1 1/2 ppd for 20 years quit in roughtly 2007  . Headache 11/25/2015  . Hematuria 04/09/2014  .  Hoarseness 02/04/2012    RESOLVED - THOUGHT TO HAVE BEEN ALLERY RELATED  . Hyperglycemia 04/06/2014  . Hyperlipidemia   . Hyperparathyroidism ([place])   . Hypertension   . Obesity, unspecified 05/31/2012  . Ocular migraine   . Overactive bladder 05/31/2012  . Preventative health care 02/04/2012  . PTSD (post-traumatic stress disorder)   . Rapid heart beat    PT FEELS PROB RELATED TO HYPER PARATHYROIDISM  . Rheumatoid arthritis ([place])   . SUI (stress urinary incontinence, female) 02/04/2012   Sees Dr Perlie Gold and they have discussed a bladder tack but so far she declines    Family History  Problem Relation Age of Onset  . Hypertension Mother 78       alive  . Renal cancer Mother        renal  . Hyperlipidemia Mother   . Osteoporosis Mother        humerus   . Macular degeneration Mother   . Tremor Mother   . Parkinson's disease Mother   . Hypertension Father 84       alive  . Atrial fibrillation Father   . Colon cancer Father        colon  . Hyperlipidemia Father   . Stroke Father   . Alzheimer's disease Father   . Hypertension Brother 76       alive  . Tremor Brother   . Hypertension Sister   . Lung cancer Maternal Grandfather        lung/ smoker  . Emphysema Maternal Grandfather   . Heart disease Paternal Grandmother   . Other Paternal Grandfather        black lung  . Heart disease Paternal Grandfather   . Asthma Son   . Eczema Son   . Healthy Son   . Healthy Son   . Heart disease Paternal Aunt   . Heart disease Paternal Uncle    Past Surgical History:  Procedure Laterality Date  . BREAST EXCISIONAL BIOPSY Left    Age 28 Fibroadenoma  . BREAST SURGERY     left breast excision of adenoma  . btl    . PARATHYROIDECTOMY Right 08/12/2013   Procedure: right inferior frozen section PARATHYROIDECTOMY;  Surgeon: Earnstine Regal, MD;  Location: [facility];  Service: General;  Laterality: Right;  . PELVIC LAPAROSCOPY  03-2004  . TOTAL SHOULDER REPLACEMENT Right 01/2019  . TUBAL LIGATION    . WISDOM  TOOTH EXTRACTION  30 yrs ago   Social History   Social History Narrative  . Not on file   Immunization History  Administered Date(s) Administered  . Hepatitis A, Adult 09/04/2016, 03/27/2017  . Influenza Split 12/09/2011  . Influenza,inj,Quad PF,6+ Mos 11/29/2012, 11/01/2013, 11/20/2015, 12/29/2017, 04/03/2019, 12/13/2019  . PFIZER SARS-COV-2 Vaccination 05/20/2019, 06/15/2019, 01/11/2020  . Pneumococcal Polysaccharide-23 08/01/2019  . Tdap 06/07/2015  . Zoster Recombinat (Shingrix) 08/01/2019     Objective: Vital Signs: LMP 04/01/2011    Physical Exam Vitals and nursing note reviewed.  Constitutional:      Appearance: She is well-developed and well-nourished.  HENT:     Head: Normocephalic and atraumatic.  Eyes:     Extraocular Movements: EOM normal.     Conjunctiva/sclera: Conjunctivae normal.  Cardiovascular:     Pulses: Intact distal pulses.  Pulmonary:     Effort: Pulmonary effort is normal.  Abdominal:     Palpations: Abdomen is soft.  Musculoskeletal:     Cervical back: Normal range of motion.  Skin:    General:  Skin is warm and dry.     Capillary Refill: Capillary refill takes less than 2 seconds.  Neurological:     Mental Status: She is alert and oriented to person, place, and time.  Psychiatric:        Mood and Affect: Mood and affect normal.        Behavior: Behavior normal.      Musculoskeletal Exam:   CDAI Exam: CDAI Score: -- Patient Global: --; Provider Global: -- Swollen: --; Tender: -- Joint Exam 02/21/2020   No joint exam has been documented for this visit   There is currently no information documented on the homunculus. Go to the Rheumatology activity and complete the homunculus joint exam.  Investigation: No additional findings.  Imaging: No results found.  Recent Labs: Lab Results  Component Value Date   WBC 5.7 01/24/2020   HGB 14.4 01/24/2020   PLT 279 01/24/2020   NA 139 01/24/2020   K 5.2 01/24/2020   CL 101  01/24/2020   CO2 28 01/24/2020   GLUCOSE 89 01/24/2020   BUN 14 01/24/2020   CREATININE 0.74 01/24/2020   BILITOT 0.3 01/24/2020   ALKPHOS 110 05/03/2019   AST 24 01/24/2020   ALT 30 (H) 01/24/2020   PROT 7.5 01/24/2020   ALBUMIN 4.6 05/03/2019   CALCIUM 10.4 01/24/2020   GFRAA 103 01/24/2020   QFTBGOLDPLUS NEGATIVE 07/26/2019    Speciality Comments: No specialty comments available.  Procedures:  No procedures performed Allergies: Cefdinir, Doxycycline, Codeine, Penicillins, and Simvastatin   Assessment / Plan:     Visit Diagnoses: Rheumatoid arthritis of multiple sites with negative rheumatoid factor (HCC) - ANA 1: 40 cytoplasmic, all other antibodies negative:  High risk medication use - Arava, Humira 40 mg sq injections qo wk,( Methotrexate 0.8 ml sq once weekly and folic acid 2 mg po daily_ dcd by pt.).  Status post total shoulder replacement, right  Primary osteoarthritis of both hands  Primary osteoarthritis of both feet  Essential hypertension  Hyperparathyroidism, primary ([place]) - Status post resection of parathyroid tumor per patient.  Hashimoto's thyroiditis - Followed by Dr. Buddy Duty.  History of miscarriage  History of hyperlipidemia  History of gastroesophageal reflux (GERD)  Hx of migraines  Former smoker  Anxiety and depression - she is going through a lot of stress as her son was diagnosed with having sarcoma and undergoing chemotherapy after surgery   Orders: No orders of the defined types were placed in this encounter.  No orders of the defined types were placed in this encounter.     Follow-Up Instructions: No follow-ups on file.   Ofilia Neas, PA-C  Note - This record has been created using Dragon software.  Chart creation errors have been sought, but may not always  have been located. Such creation errors do not reflect on  the standard of medical care.

## 2020-02-09 ENCOUNTER — Telehealth: Payer: Self-pay | Admitting: Family Medicine

## 2020-02-09 ENCOUNTER — Other Ambulatory Visit: Payer: Self-pay | Admitting: Family Medicine

## 2020-02-09 MED ORDER — ATORVASTATIN CALCIUM 40 MG PO TABS: 40.0000 mg | ORAL_TABLET | Freq: Every day | ORAL | 1 refills | Status: DC

## 2020-02-09 NOTE — Telephone Encounter (Signed)
Patient states she need a script written for   atorvastatin (LIPITOR) 20 MG tablet  @ 40mg  and not 20mg  , the doctor change medication dosage. For insurance purposes please update to 40mg  and resubmit to pharmacy so they will pay

## 2020-02-09 NOTE — Telephone Encounter (Signed)
[facility]

## 2020-02-10 ENCOUNTER — Other Ambulatory Visit: Payer: Self-pay | Admitting: Family Medicine

## 2020-02-13 ENCOUNTER — Telehealth (INDEPENDENT_AMBULATORY_CARE_PROVIDER_SITE_OTHER): Payer: No Typology Code available for payment source | Admitting: Family Medicine

## 2020-02-13 ENCOUNTER — Other Ambulatory Visit: Payer: Self-pay

## 2020-02-13 DIAGNOSIS — I10 Essential (primary) hypertension: Secondary | ICD-10-CM | POA: Diagnosis not present

## 2020-02-13 DIAGNOSIS — M06032 Rheumatoid arthritis without rheumatoid factor, left wrist: Secondary | ICD-10-CM

## 2020-02-13 DIAGNOSIS — F419 Anxiety disorder, unspecified: Secondary | ICD-10-CM

## 2020-02-13 DIAGNOSIS — R739 Hyperglycemia, unspecified: Secondary | ICD-10-CM | POA: Diagnosis not present

## 2020-02-13 DIAGNOSIS — F32A Depression, unspecified: Secondary | ICD-10-CM

## 2020-02-13 DIAGNOSIS — M06031 Rheumatoid arthritis without rheumatoid factor, right wrist: Secondary | ICD-10-CM | POA: Diagnosis not present

## 2020-02-13 MED ORDER — ESCITALOPRAM OXALATE 10 MG PO TABS: 10.0000 mg | ORAL_TABLET | Freq: Every day | ORAL | 1 refills | Status: DC

## 2020-02-13 NOTE — Assessment & Plan Note (Signed)
Lexapro is helping despite the stress of her pain and son's cancer diagnosis so a refill is sent in and she will report any concerns.

## 2020-02-13 NOTE — Assessment & Plan Note (Signed)
Is on Humira and Arava together with no obvious improvement. Is following with Dr Estanislado Pandy later this month. Is taking Turmeric bid, Voltaren gel on feet and hands, consider adding Lidocaine gel and Quercetin. Really only relieves with steroids but she is trying not to take it.

## 2020-02-13 NOTE — Assessment & Plan Note (Signed)
hgba1c acceptable, minimize simple carbs. Increase exercise as tolerated. Continue current meds 

## 2020-02-13 NOTE — Assessment & Plan Note (Signed)
Well controlled, no changes to meds. Encouraged heart healthy diet such as the DASH diet and exercise as tolerated.  °

## 2020-02-13 NOTE — Progress Notes (Signed)
Virtual Visit via Video Note  I connected with Sandra [name] on 02/13/20 at  2:20 PM EST by a video enabled telemedicine application and verified that I am speaking with the correct person using two identifiers.  Location: Patient: home, patient and provider in visit Provider: office   I discussed the limitations of evaluation and management by telemedicine and the availability of in person appointments. The patient expressed understanding and agreed to proceed. A Mikey Bussing, LPN was able to get the patient set up with a video visit   Subjective:    Patient ID: Sandra [name], female    DOB: 22-Jul-1960, 59 y.o.   MRN: [id number]  Chief Complaint  Patient presents with  . Follow-up    HPI Patient is in today for follow up on chronic medical concerns. No recent febrile illness or hospitalizations. She is pleased with her response to Lexapro and she reports she is feeling better and much calmer. She is managing the stress of her son's cancer treatment and her stress over her own health concerns well. No suicidal ideation or anhedonia. Denies CP/palp/SOB/HA/congestion/fevers/GI or GU c/o. Taking meds as prescribed  Past Medical History:  Diagnosis Date  . Allergic rhinitis 05/31/2012  . Chest pain    PT SAW CARDIOLOGIST DR. Burt Knack - STRESS ECHO DONE NEGATIVE - NO PROBLEM SINCE  . Chicken pox as achild  . Cough 11/29/2012   RESOLVED - IT WAS CAUSED BY LISINOPRIL  . Elevated LFTs 05/31/2012  . Eustachian tube dysfunction 07/27/2017  . GERD (gastroesophageal reflux disease)   . H/O tobacco use, presenting hazards to health 11/29/2012   Smoked 1 1/2 ppd for 20 years quit in roughtly 2007  . Headache 11/25/2015  . Hematuria 04/09/2014  . Hoarseness 02/04/2012   RESOLVED - THOUGHT TO HAVE BEEN ALLERY RELATED  . Hyperglycemia 04/06/2014  . Hyperlipidemia   . Hyperparathyroidism ([place])   . Hypertension   . Obesity, unspecified 05/31/2012  . Ocular migraine   . Overactive bladder 05/31/2012    . Preventative health care 02/04/2012  . PTSD (post-traumatic stress disorder)   . Rapid heart beat    PT FEELS PROB RELATED TO HYPER PARATHYROIDISM  . Rheumatoid arthritis ([place])   . SUI (stress urinary incontinence, female) 02/04/2012   Sees Dr Perlie Gold and they have discussed a bladder tack but so far she declines    Past Surgical History:  Procedure Laterality Date  . BREAST EXCISIONAL BIOPSY Left    Age 50 Fibroadenoma  . BREAST SURGERY     left breast excision of adenoma  . btl    . PARATHYROIDECTOMY Right 08/12/2013   Procedure: right inferior frozen section PARATHYROIDECTOMY;  Surgeon: Earnstine Regal, MD;  Location: [facility];  Service: General;  Laterality: Right;  . PELVIC LAPAROSCOPY  03-2004  . TOTAL SHOULDER REPLACEMENT Right 01/2019  . TUBAL LIGATION    . WISDOM TOOTH EXTRACTION  30 yrs ago    Family History  Problem Relation Age of Onset  . Hypertension Mother 52       alive  . Renal cancer Mother        renal  . Hyperlipidemia Mother   . Osteoporosis Mother        humerus   . Macular degeneration Mother   . Tremor Mother   . Parkinson's disease Mother   . Hypertension Father 24       alive  . Atrial fibrillation Father   . Colon cancer Father  colon  . Hyperlipidemia Father   . Stroke Father   . Alzheimer's disease Father   . Hypertension Brother 53       alive  . Tremor Brother   . Hypertension Sister   . Lung cancer Maternal Grandfather        lung/ smoker  . Emphysema Maternal Grandfather   . Heart disease Paternal Grandmother   . Other Paternal Grandfather        black lung  . Heart disease Paternal Grandfather   . Asthma Son   . Eczema Son   . Healthy Son   . Healthy Son   . Heart disease Paternal Aunt   . Heart disease Paternal Uncle     Social History   Socioeconomic History  . Marital status: Married    Spouse name: Not on file  . Number of children: 2  . Years of education: Not on file  . Highest education level: Not on  file  Occupational History  . Occupation: Programmer, multimedia: [employer]  Tobacco Use  . Smoking status: Former Smoker    Packs/day: 1.00    Years: 20.00    Pack years: 20.00    Types: Cigarettes    Quit date: 03/10/2005    Years since quitting: 14.9  . Smokeless tobacco: Never Used  Vaping Use  . Vaping Use: Never used  Substance and Sexual Activity  . Alcohol use: Not Currently  . Drug use: No  . Sexual activity: Yes    Partners: Male  Other Topics Concern  . Not on file  Social History Narrative  . Not on file   Social Determinants of Health   Financial Resource Strain:   . Difficulty of Paying Living Expenses: Not on file  Food Insecurity:   . Worried About Charity fundraiser in the Last Year: Not on file  . Ran Out of Food in the Last Year: Not on file  Transportation Needs:   . Lack of Transportation (Medical): Not on file  . Lack of Transportation (Non-Medical): Not on file  Physical Activity:   . Days of Exercise per Week: Not on file  . Minutes of Exercise per Session: Not on file  Stress:   . Feeling of Stress : Not on file  Social Connections:   . Frequency of Communication with Friends and Family: Not on file  . Frequency of Social Gatherings with Friends and Family: Not on file  . Attends Religious Services: Not on file  . Active Member of Clubs or Organizations: Not on file  . Attends Archivist Meetings: Not on file  . Marital Status: Not on file  Intimate Partner Violence:   . Fear of Current or Ex-Partner: Not on file  . Emotionally Abused: Not on file  . Physically Abused: Not on file  . Sexually Abused: Not on file    Outpatient Medications Prior to Visit  Medication Sig Dispense Refill  . ALPRAZolam (XANAX) 0.25 MG tablet Take 1 tablet (0.25 mg total) by mouth 2 (two) times daily as needed for anxiety. 20 tablet 0  . aspirin 325 MG tablet Take 325 mg by mouth daily.    Marland Kitchen atorvastatin (LIPITOR) 40 MG tablet Take 1 tablet (40 mg  total) by mouth daily. 90 tablet 1  . Cholecalciferol (VITAMIN D PO) Take 2,000 mg by mouth daily. Vitamin D    . Co-Enzyme Q-10 30 MG CAPS Take 100 mg by mouth at bedtime.    Marland Kitchen  DOCOSAHEXAENOIC ACID PO Take 1 tablet by mouth daily.    . folic acid (FOLVITE) 1 MG tablet Take 2 tablets (2 mg total) by mouth daily. 180 tablet 3  . furosemide (LASIX) 20 MG tablet TAKE 1 TABLET BY MOUTH EVERY DAY 90 tablet 1  . HUMIRA PEN 40 MG/0.4ML PNKT INJECT 1 PEN UNDER THE SKIN EVERY 14 DAYS. 6 each 0  . irbesartan (AVAPRO) 150 MG tablet TAKE 1 TABLET BY MOUTH EVERY DAY 90 tablet 1  . lactobacillus acidophilus (BACID) TABS tablet Take 2 tablets by mouth 3 (three) times daily.    Marland Kitchen leflunomide (ARAVA) 20 MG tablet Take 1 tablet (20 mg total) by mouth daily. 30 tablet 2  . levothyroxine (SYNTHROID) 75 MCG tablet Take 1 tablet (75 mcg total) by mouth daily before breakfast. 30 tablet 0  . metoprolol succinate (TOPROL-XL) 25 MG 24 hr tablet TAKE 1 TABLET BY MOUTH EVERY DAY 90 tablet 0  . omeprazole (PRILOSEC) 40 MG capsule TAKE 1 CAPSULE BY MOUTH EVERY DAY 90 capsule 3  . OVER THE COUNTER MEDICATION Take 1 capsule by mouth daily. Mega red- 1 capsule daily    . predniSONE (DELTASONE) 5 MG tablet Take 4 tabs po x 7 days, 3  tabs po x 7 days, 2  tabs po x 7 days, 1  tab po x 7 days, 0.5 tab po x 7 days 74 tablet 0  . verapamil (CALAN-SR) 120 MG CR tablet TAKE 1 TABLET BY MOUTH EVERYDAY AT BEDTIME 90 tablet 3  . escitalopram (LEXAPRO) 10 MG tablet Take 1 tablet (10 mg total) by mouth daily. 90 tablet 0   No facility-administered medications prior to visit.    Allergies  Allergen Reactions  . Cefdinir Other (See Comments)    Severe Stomach cramps and diarrhea for several days.  . Doxycycline Diarrhea  . Codeine Nausea And Vomiting  . Penicillins     RASH, ITCHING  . Simvastatin     myalgias    Review of Systems  Constitutional: Negative for fever and malaise/fatigue.  HENT: Negative for congestion.   Eyes:  Negative for blurred vision.  Respiratory: Negative for shortness of breath.   Cardiovascular: Negative for chest pain, palpitations and leg swelling.  Gastrointestinal: Negative for abdominal pain, blood in stool and nausea.  Genitourinary: Negative for dysuria and frequency.  Musculoskeletal: Positive for back pain, joint pain and myalgias. Negative for falls.  Skin: Negative for rash.  Neurological: Negative for dizziness, loss of consciousness and headaches.  Endo/Heme/Allergies: Negative for environmental allergies.  Psychiatric/Behavioral: Positive for depression. The patient is nervous/anxious.        Objective:    Physical Exam Constitutional:      Appearance: Normal appearance. She is not ill-appearing.  HENT:     Head: Normocephalic and atraumatic.     Right Ear: External ear normal.     Left Ear: External ear normal.     Nose: Nose normal.  Eyes:     Extraocular Movements: Extraocular movements intact.     Pupils: Pupils are equal, round, and reactive to light.  Cardiovascular:     Heart sounds: Normal heart sounds.  Pulmonary:     Effort: Pulmonary effort is normal.  Neurological:     Mental Status: She is alert and oriented to person, place, and time.  Psychiatric:        Behavior: Behavior normal.     LMP 04/01/2011  Wt Readings from Last 3 Encounters:  01/10/20 206 lb 12.8 oz (  93.8 kg)  12/13/19 205 lb 12.8 oz (93.4 kg)  12/02/19 209 lb (94.8 kg)    Diabetic Foot Exam - Simple   No data filed     Lab Results  Component Value Date   WBC 5.7 01/24/2020   HGB 14.4 01/24/2020   HCT 43.3 01/24/2020   PLT 279 01/24/2020   GLUCOSE 89 01/24/2020   CHOL 249 (H) 12/13/2019   TRIG 113 12/13/2019   HDL 58 12/13/2019   LDLDIRECT 201.0 12/14/2015   LDLCALC 167 (H) 12/13/2019   ALT 30 (H) 01/24/2020   AST 24 01/24/2020   NA 139 01/24/2020   K 5.2 01/24/2020   CL 101 01/24/2020   CREATININE 0.74 01/24/2020   BUN 14 01/24/2020   CO2 28 01/24/2020    TSH 1.19 12/13/2019   HGBA1C 6.0 (H) 12/13/2019    Lab Results  Component Value Date   TSH 1.19 12/13/2019   Lab Results  Component Value Date   WBC 5.7 01/24/2020   HGB 14.4 01/24/2020   HCT 43.3 01/24/2020   MCV 89.8 01/24/2020   PLT 279 01/24/2020   Lab Results  Component Value Date   NA 139 01/24/2020   K 5.2 01/24/2020   CO2 28 01/24/2020   GLUCOSE 89 01/24/2020   BUN 14 01/24/2020   CREATININE 0.74 01/24/2020   BILITOT 0.3 01/24/2020   ALKPHOS 110 05/03/2019   AST 24 01/24/2020   ALT 30 (H) 01/24/2020   PROT 7.5 01/24/2020   ALBUMIN 4.6 05/03/2019   CALCIUM 10.4 01/24/2020   GFR 100.99 05/03/2019   Lab Results  Component Value Date   CHOL 249 (H) 12/13/2019   Lab Results  Component Value Date   HDL 58 12/13/2019   Lab Results  Component Value Date   LDLCALC 167 (H) 12/13/2019   Lab Results  Component Value Date   TRIG 113 12/13/2019   Lab Results  Component Value Date   CHOLHDL 4.3 12/13/2019   Lab Results  Component Value Date   HGBA1C 6.0 (H) 12/13/2019       Assessment & Plan:   Problem List Items Addressed This Visit    Hypertension    Well controlled, no changes to meds. Encouraged heart healthy diet such as the DASH diet and exercise as tolerated.       Hyperglycemia    hgba1c acceptable, minimize simple carbs. Increase exercise as tolerated. Continue current meds      Anxiety and depression    Lexapro is helping despite the stress of her pain and son's cancer diagnosis so a refill is sent in and she will report any concerns.       Relevant Medications   escitalopram (LEXAPRO) 10 MG tablet   Rheumatoid arthritis ([place])    Is on Humira and Arava together with no obvious improvement. Is following with Dr Estanislado Pandy later this month. Is taking Turmeric bid, Voltaren gel on feet and hands, consider adding Lidocaine gel and Quercetin. Really only relieves with steroids but she is trying not to take it.          I am having [name] maintain her Cholecalciferol (VITAMIN D PO), OVER THE COUNTER MEDICATION, lactobacillus acidophilus, Co-Enzyme Q-10, DOCOSAHEXAENOIC ACID PO, omeprazole, aspirin, verapamil, folic acid, irbesartan, predniSONE, ALPRAZolam, leflunomide, furosemide, Humira Pen, levothyroxine, atorvastatin, metoprolol succinate, and escitalopram.  Meds ordered this encounter  Medications  . escitalopram (LEXAPRO) 10 MG tablet    Sig: Take 1 tablet (10 mg total) by mouth daily.  Dispense:  90 tablet    Refill:  1     I discussed the assessment and treatment plan with the patient. The patient was provided an opportunity to ask questions and all were answered. The patient agreed with the plan and demonstrated an understanding of the instructions.   The patient was advised to call back or seek an in-person evaluation if the symptoms worsen or if the condition fails to improve as anticipated.  I provided 20 minutes of non-face-to-face time during this encounter.   Penni Homans, MD

## 2020-02-16 ENCOUNTER — Other Ambulatory Visit: Payer: Self-pay

## 2020-02-16 MED ORDER — PREDNISONE 5 MG PO TABS: ORAL_TABLET | ORAL | 0 refills | Status: DC

## 2020-02-16 NOTE — Telephone Encounter (Signed)
Ok to send in prednisone taper starting at 20 mg tapering by 5 mg every 2 days.  We will discuss other treatment options in detail at her upcoming follow up visit.

## 2020-02-16 NOTE — Telephone Encounter (Signed)
Advised patient we will send in prednisone taper and discuss treatment options at the follow up visit. Patient verbalized understanding. [facility] [place] [place].

## 2020-02-16 NOTE — Telephone Encounter (Signed)
Patient called stating she is in a lot of pain and doesn't think the Humira and Arava are helping with her symptoms.  Patient is requesting a return call.

## 2020-02-16 NOTE — Telephone Encounter (Signed)
Patient is having bil hand, bil feet and bil shoulder pain. She has had no improvement from the [place], patient is requesting prednisone. Please advise. Patient has appt with Lovena Le 02/21/2020.

## 2020-02-21 ENCOUNTER — Ambulatory Visit: Payer: No Typology Code available for payment source | Admitting: Physician Assistant

## 2020-02-21 DIAGNOSIS — E063 Autoimmune thyroiditis: Secondary | ICD-10-CM

## 2020-02-21 DIAGNOSIS — M19041 Primary osteoarthritis, right hand: Secondary | ICD-10-CM

## 2020-02-21 DIAGNOSIS — Z96611 Presence of right artificial shoulder joint: Secondary | ICD-10-CM

## 2020-02-21 DIAGNOSIS — Z8639 Personal history of other endocrine, nutritional and metabolic disease: Secondary | ICD-10-CM

## 2020-02-21 DIAGNOSIS — F32A Depression, unspecified: Secondary | ICD-10-CM

## 2020-02-21 DIAGNOSIS — Z79899 Other long term (current) drug therapy: Secondary | ICD-10-CM

## 2020-02-21 DIAGNOSIS — M0609 Rheumatoid arthritis without rheumatoid factor, multiple sites: Secondary | ICD-10-CM

## 2020-02-21 DIAGNOSIS — M19072 Primary osteoarthritis, left ankle and foot: Secondary | ICD-10-CM

## 2020-02-21 DIAGNOSIS — Z8669 Personal history of other diseases of the nervous system and sense organs: Secondary | ICD-10-CM

## 2020-02-21 DIAGNOSIS — Z87891 Personal history of nicotine dependence: Secondary | ICD-10-CM

## 2020-02-21 DIAGNOSIS — I10 Essential (primary) hypertension: Secondary | ICD-10-CM

## 2020-02-21 DIAGNOSIS — Z8719 Personal history of other diseases of the digestive system: Secondary | ICD-10-CM

## 2020-02-21 DIAGNOSIS — E210 Primary hyperparathyroidism: Secondary | ICD-10-CM

## 2020-02-21 DIAGNOSIS — Z8759 Personal history of other complications of pregnancy, childbirth and the puerperium: Secondary | ICD-10-CM

## 2020-02-22 ENCOUNTER — Telehealth: Payer: Self-pay | Admitting: Pharmacist

## 2020-02-22 ENCOUNTER — Ambulatory Visit (INDEPENDENT_AMBULATORY_CARE_PROVIDER_SITE_OTHER): Payer: No Typology Code available for payment source | Admitting: Physician Assistant

## 2020-02-22 ENCOUNTER — Other Ambulatory Visit: Payer: Self-pay

## 2020-02-22 ENCOUNTER — Encounter: Payer: Self-pay | Admitting: Physician Assistant

## 2020-02-22 VITALS — BP 160/105 | HR 77 | Resp 15 | Ht 65.0 in | Wt 204.2 lb

## 2020-02-22 DIAGNOSIS — M19071 Primary osteoarthritis, right ankle and foot: Secondary | ICD-10-CM

## 2020-02-22 DIAGNOSIS — Z79899 Other long term (current) drug therapy: Secondary | ICD-10-CM

## 2020-02-22 DIAGNOSIS — M19041 Primary osteoarthritis, right hand: Secondary | ICD-10-CM

## 2020-02-22 DIAGNOSIS — Z8759 Personal history of other complications of pregnancy, childbirth and the puerperium: Secondary | ICD-10-CM

## 2020-02-22 DIAGNOSIS — I10 Essential (primary) hypertension: Secondary | ICD-10-CM

## 2020-02-22 DIAGNOSIS — Z8639 Personal history of other endocrine, nutritional and metabolic disease: Secondary | ICD-10-CM

## 2020-02-22 DIAGNOSIS — F419 Anxiety disorder, unspecified: Secondary | ICD-10-CM

## 2020-02-22 DIAGNOSIS — Z8669 Personal history of other diseases of the nervous system and sense organs: Secondary | ICD-10-CM

## 2020-02-22 DIAGNOSIS — Z96611 Presence of right artificial shoulder joint: Secondary | ICD-10-CM

## 2020-02-22 DIAGNOSIS — Z87891 Personal history of nicotine dependence: Secondary | ICD-10-CM

## 2020-02-22 DIAGNOSIS — Z8719 Personal history of other diseases of the digestive system: Secondary | ICD-10-CM

## 2020-02-22 DIAGNOSIS — M0609 Rheumatoid arthritis without rheumatoid factor, multiple sites: Secondary | ICD-10-CM

## 2020-02-22 DIAGNOSIS — M19072 Primary osteoarthritis, left ankle and foot: Secondary | ICD-10-CM

## 2020-02-22 DIAGNOSIS — E210 Primary hyperparathyroidism: Secondary | ICD-10-CM

## 2020-02-22 DIAGNOSIS — E063 Autoimmune thyroiditis: Secondary | ICD-10-CM

## 2020-02-22 DIAGNOSIS — M19042 Primary osteoarthritis, left hand: Secondary | ICD-10-CM

## 2020-02-22 DIAGNOSIS — F32A Depression, unspecified: Secondary | ICD-10-CM

## 2020-02-22 MED ORDER — PREDNISONE 5 MG PO TABS: ORAL_TABLET | ORAL | 0 refills | Status: DC

## 2020-02-22 NOTE — Patient Instructions (Addendum)
Standing Labs We placed an order today for your standing lab work.   Please have your standing labs drawn in 1 month then every 3 months   If possible, please have your labs drawn 2 weeks prior to your appointment so that the provider can discuss your results at your appointment.  We have open lab daily Monday through Thursday from 8:30-12:30 PM and 1:30-4:30 PM and Friday from 8:30-12:30 PM and 1:30-4:00 PM at the office of Dr. Bo Merino, [facility].   Please be advised, patients with office appointments requiring lab work will take precedents over walk-in lab work.  If possible, please come for your lab work on Monday and Friday afternoons, as you may experience shorter wait times. The office is located at [address] [place], [place], [place] [address] No appointment is necessary.   Labs are drawn by [facility]. Please bring your co-pay at the time of your lab draw.  You may receive a bill from [facility] for your lab work.  If you wish to have your labs drawn at another location, please call the office 24 hours in advance to send orders.  If you have any questions regarding directions or hours of operation,  please call [phone].   As a reminder, please drink plenty of water prior to coming for your lab work. Thanks!    Etanercept injection What is this medicine? ETANERCEPT (et a [employer]) is used for the treatment of rheumatoid arthritis in adults and children. The medicine is also used to treat psoriatic arthritis, ankylosing spondylitis, and psoriasis. This medicine may be used for other purposes; ask your health care provider or pharmacist if you have questions. COMMON BRAND NAME(S): Enbrel What should I tell my health care provider before I take this medicine? They need to know if you have any of these conditions:  blood disorders  cancer  congestive heart failure  diabetes  exposure to chickenpox  immune system problems  infection  multiple  sclerosis  seizure disorder  tuberculosis, a positive skin test for tuberculosis or have recently been in close contact with someone who has tuberculosis  Wegener's granulomatosis  an unusual or allergic reaction to etanercept, latex, other medicines, foods, dyes, or preservatives  pregnant or trying to get pregnant  breast-feeding How should I use this medicine? The medicine is given by injection under the skin. You will be taught how to prepare and give this medicine. Use exactly as directed. Take your medicine at regular intervals. Do not take your medicine more often than directed. It is important that you put your used needles and syringes in a special sharps container. Do not put them in a trash can. If you do not have a sharps container, call your pharmacist or healthcare provider to get one. A special MedGuide will be given to you by the pharmacist with each prescription and refill. Be sure to read this information carefully each time. Talk to your pediatrician regarding the use of this medicine in children. While this drug may be prescribed for children as young as 63 years of age for selected conditions, precautions do apply. Overdosage: If you think you have taken too much of this medicine contact a poison control center or emergency room at once. NOTE: This medicine is only for you. Do not share this medicine with others. What if I miss a dose? If you miss a dose, contact your health care professional to find out when you should take your next dose. Do not take  double or extra doses without advice. What may interact with this medicine? Do not take this medicine with any of the following medications:  anakinra This medicine may also interact with the following medications:  cyclophosphamide  sulfasalazine  vaccines This list may not describe all possible interactions. Give your health care provider a list of all the medicines, herbs, non-prescription drugs, or dietary  supplements you use. Also tell them if you smoke, drink alcohol, or use illegal drugs. Some items may interact with your medicine. What should I watch for while using this medicine? Tell your doctor or healthcare professional if your symptoms do not start to get better or if they get worse. You will be tested for tuberculosis (TB) before you start this medicine. If your doctor prescribes any medicine for TB, you should start taking the TB medicine before starting this medicine. Make sure to finish the full course of TB medicine. Call your doctor or health care professional for advice if you get a fever, chills or sore throat, or other symptoms of a cold or flu. Do not treat yourself. This drug decreases your body's ability to fight infections. Try to avoid being around people who are sick. What side effects may I notice from receiving this medicine? Side effects that you should report to your doctor or health care professional as soon as possible:  allergic reactions like skin rash, itching or hives, swelling of the face, lips, or tongue  changes in vision  fever, chills or any other sign of infection  numbness or tingling in legs or other parts of the body  red, scaly patches or raised bumps on the skin  shortness of breath or difficulty breathing  swollen lymph nodes in the neck, underarm, or groin areas  unexplained weight loss  unusual bleeding or bruising  unusual swelling or fluid retention in the legs  unusually weak or tired Side effects that usually do not require medical attention (report to your doctor or health care professional if they continue or are bothersome):  dizziness  headache  nausea  redness, itching, or swelling at the injection site  vomiting This list may not describe all possible side effects. Call your doctor for medical advice about side effects. You may report side effects to FDA at 1-800-FDA-1088. Where should I keep my medicine? Keep out of  the reach of children. Enbrel products: Store unopened Enbrel vials, cartridges, or pens in a refrigerator between 2 and 8 degrees C (36 and 46 degrees F). Do not freeze. Do not shake. Keep in the original container to protect from light. Throw away any unopened and unused medicine that has been stored in the refrigerator after the expiration date. If needed, you may store an Enbrel single-use vial, single-dose prefilled syringe, SureClick autoinjector, or Enbrel Mini cartridge at room temperature between 20 to 25 degrees C (68 to 77 degrees F) for up to 14 days. Protect from light, heat, and moisture. Do not shake. Once any of these items are stored at room temperature, do not place them back into the refrigerator. Throw the item away after 14 days, even if it still contains medicine. If you are using the Enbrel multi-dose vials, you will be instructed on how to dilute and store this medicine once it has been diluted. The Enbrel AutoTouch reusable autoinjector device should be stored at room temperature. Do NOT refrigerate the AutoTouch reusable autoinjector. Erelzi products: Store [employer] in the refrigerator between 2 and 8 degrees C (  36 and 46 degrees F). Do not freeze. Do not shake. Keep in the original container to protect from light. Throw away any unopened and unused medicine that has been stored in the refrigerator after the expiration date. If needed, you may store the [facility] prefilled syringe or pen at room temperature between 20 to 25 degrees C (68 to 77 degrees F) for up to 28 days. Protect from light, heat, and moisture. Do not shake. Once any of these items are stored at room temperature, do not place them back into the refrigerator. Throw the item away after 28 days, even if it still contains medicine. NOTE: This sheet is a summary. It may not cover all possible information. If you have questions about this medicine, talk to your doctor, pharmacist, or  health care provider.  2020 Elsevier/Gold Standard (2018-05-24 09:49:58)

## 2020-02-22 NOTE — Progress Notes (Deleted)
Office Visit Note  Patient: Sandra [name]             Date of Birth: February 16, 1961           MRN: [id number]             PCP: [name], MD Referring: [name], MD Visit Date: 02/22/2020 Occupation: @GUAROCC @  Subjective:  No chief complaint on file.   History of Present Illness: Sandra [name] is a 59 y.o. female ***   Activities of Daily Living:  Patient reports morning stiffness for *** {minute/hour:19697}.   Patient {ACTIONS;DENIES/REPORTS:21021675::"Denies"} nocturnal pain.  Difficulty dressing/grooming: {ACTIONS;DENIES/REPORTS:21021675::"Denies"} Difficulty climbing stairs: {ACTIONS;DENIES/REPORTS:21021675::"Denies"} Difficulty getting out of chair: {ACTIONS;DENIES/REPORTS:21021675::"Denies"} Difficulty using hands for taps, buttons, cutlery, and/or writing: {ACTIONS;DENIES/REPORTS:21021675::"Denies"}  No Rheumatology ROS completed.   PMFS History:  Patient Active Problem List   Diagnosis Date Noted  . Back pain 12/13/2019  . Rheumatoid arthritis ([place]) 12/02/2019  . Anxiety and depression 08/01/2019  . Arthralgia 05/04/2019  . OSA (obstructive sleep apnea) 05/03/2019  . Thyroiditis 12/20/2018  . Muscle cramp 12/20/2018  . Eustachian tube dysfunction 07/27/2017  . Headache 11/25/2015  . Acute bacterial sinusitis 03/26/2015  . Hematuria 04/09/2014  . Hyperglycemia 04/06/2014  . Diarrhea 09/21/2013  . Hyperparathyroidism, primary ([place]) 07/12/2013  . Dyspnea 12/28/2012  . Hypercalcemia 12/16/2012  . H/O tobacco use, presenting hazards to health 11/29/2012  . Overactive bladder 05/31/2012  . Elevated LFTs 05/31/2012  . Obesity 05/31/2012  . Allergic rhinitis 05/31/2012  . Hoarseness 02/04/2012  . Preventative health care 02/04/2012  . SUI (stress urinary incontinence, female) 02/04/2012  . Migraine   . GERD (gastroesophageal reflux disease)   . Rotator cuff syndrome of right shoulder 06/11/2011  . Atypical chest pain 01/03/2011  .  Hypertension 01/03/2011  . Hyperlipidemia 01/03/2011  . KNEE PAIN, BILATERAL 09/28/2008  . ANSERINE BURSITIS 09/28/2008  . 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 Allergic rhinitis 05/31/2012  . Chest pain    PT SAW CARDIOLOGIST DR. Burt Knack - STRESS ECHO DONE NEGATIVE - NO PROBLEM SINCE  . Chicken pox as achild  . Cough 11/29/2012   RESOLVED - IT WAS CAUSED BY LISINOPRIL  . Elevated LFTs 05/31/2012  . Eustachian tube dysfunction 07/27/2017  . GERD (gastroesophageal reflux disease)   . H/O tobacco use, presenting hazards to health 11/29/2012   Smoked 1 1/2 ppd for 20 years quit in roughtly 2007  . Headache 11/25/2015  . Hematuria 04/09/2014  . Hoarseness 02/04/2012   RESOLVED - THOUGHT TO HAVE BEEN ALLERY RELATED  . Hyperglycemia 04/06/2014  . Hyperlipidemia   . Hyperparathyroidism ([place])   . Hypertension   . Obesity, unspecified 05/31/2012  . Ocular migraine   . Overactive bladder 05/31/2012  . Preventative health care 02/04/2012  . PTSD (post-traumatic stress disorder)   . Rapid heart beat    PT FEELS PROB RELATED TO HYPER PARATHYROIDISM  . Rheumatoid arthritis ([place])   . SUI (stress urinary incontinence, female) 02/04/2012   Sees Dr Perlie Gold and they have discussed a bladder tack but so far she declines    Family History  Problem Relation Age of Onset  . Hypertension Mother 62       alive  . Renal cancer Mother        renal  . Hyperlipidemia Mother   . Osteoporosis Mother        humerus   . Macular degeneration Mother   .  Tremor Mother   . Parkinson's disease Mother   . Hypertension Father 41       alive  . Atrial fibrillation Father   . Colon cancer Father        colon  . Hyperlipidemia Father   . Stroke Father   . Alzheimer's disease Father   . Hypertension Brother 69       alive  . Tremor Brother   . Hypertension Sister   . Lung cancer Maternal Grandfather        lung/ smoker  . Emphysema Maternal Grandfather    . Heart disease Paternal Grandmother   . Other Paternal Grandfather        black lung  . Heart disease Paternal Grandfather   . Asthma Son   . Eczema Son   . Healthy Son   . Healthy Son   . Heart disease Paternal Aunt   . Heart disease Paternal Uncle    Past Surgical History:  Procedure Laterality Date  . BREAST EXCISIONAL BIOPSY Left    Age 86 Fibroadenoma  . BREAST SURGERY     left breast excision of adenoma  . btl    . PARATHYROIDECTOMY Right 08/12/2013   Procedure: right inferior frozen section PARATHYROIDECTOMY;  Surgeon: Earnstine Regal, MD;  Location: [facility];  Service: General;  Laterality: Right;  . PELVIC LAPAROSCOPY  03-2004  . TOTAL SHOULDER REPLACEMENT Right 01/2019  . TUBAL LIGATION    . WISDOM TOOTH EXTRACTION  30 yrs ago   Social History   Social History Narrative  . Not on file   Immunization History  Administered Date(s) Administered  . Hepatitis A, Adult 09/04/2016, 03/27/2017  . Influenza Split 12/09/2011  . Influenza,inj,Quad PF,6+ Mos 11/29/2012, 11/01/2013, 11/20/2015, 12/29/2017, 04/03/2019, 12/13/2019  . PFIZER SARS-COV-2 Vaccination 05/20/2019, 06/15/2019, 01/11/2020  . Pneumococcal Polysaccharide-23 08/01/2019  . Tdap 06/07/2015  . Zoster Recombinat (Shingrix) 08/01/2019     Objective: Vital Signs: LMP 04/01/2011    Physical Exam   Musculoskeletal Exam: ***  CDAI Exam: CDAI Score: -- Patient Global: --; Provider Global: -- Swollen: --; Tender: -- Joint Exam 02/22/2020   No joint exam has been documented for this visit   There is currently no information documented on the homunculus. Go to the Rheumatology activity and complete the homunculus joint exam.  Investigation: No additional findings.  Imaging: No results found.  Recent Labs: Lab Results  Component Value Date   WBC 5.7 01/24/2020   HGB 14.4 01/24/2020   PLT 279 01/24/2020   NA 139 01/24/2020   K 5.2 01/24/2020   CL 101 01/24/2020   CO2 28 01/24/2020   GLUCOSE 89  01/24/2020   BUN 14 01/24/2020   CREATININE 0.74 01/24/2020   BILITOT 0.3 01/24/2020   ALKPHOS 110 05/03/2019   AST 24 01/24/2020   ALT 30 (H) 01/24/2020   PROT 7.5 01/24/2020   ALBUMIN 4.6 05/03/2019   CALCIUM 10.4 01/24/2020   GFRAA 103 01/24/2020   QFTBGOLDPLUS NEGATIVE 07/26/2019    Speciality Comments: No specialty comments available.  Procedures:  No procedures performed Allergies: Cefdinir, Doxycycline, Codeine, Penicillins, and Simvastatin   Assessment / Plan:     Visit Diagnoses: No diagnosis found.  Orders: No orders of the defined types were placed in this encounter.  No orders of the defined types were placed in this encounter.    Follow-Up Instructions: No follow-ups on file.   Ofilia Neas, PA-C  Note - This record has been created using Dragon software.  Chart creation errors have been sought, but may not always  have been located. Such creation errors do not reflect on  the standard of medical care.

## 2020-02-22 NOTE — Progress Notes (Signed)
Office Visit Note  Patient: Sandra [name]             Date of Birth: 1960-10-30           MRN: [id number]             PCP: [name], MD Referring: [name], MD Visit Date: 02/22/2020 Occupation: @GUAROCC @  Subjective:  Pain in multiple joints   History of Present Illness: Sandra [name] is a 59 y.o. female with seronegative rheumatoid arthritis and osteoarthritis.  Patient is currently on Humira 40 mg subcutaneous injections every 14 days and Arava 20 mg 1 tablet by mouth daily.  She has not missed any doses of Humira or Arava recently.  She is tolerating both medications without any side effects.  She was initially started on Humira on 11/23/2019 and was started on [place] after her last office visit on 01/10/2020.  She has not noticed any clinical improvement since starting on Humira or Arava.  She continues to have recurrent flares in multiple joints.  She is currently having pain intermittent swelling in both hands, both wrist joints, left elbow joint, both ankle joints, and both feet.  She is currently taking a prednisone taper as prescribed.  According to the patient prior to starting on the prednisone taper her pain was a 10 out of 10 and is now a 7 out of 10.  She is also noticed significant fatigue since she has not been sleeping well at night.  Her nocturnal pain has been severe at times.  She is feeling very discouraged currently.  She will extending her work leave due to having difficulty performing ADLs, severe pain, and fatigue.    Activities of Daily Living:  Patient reports morning stiffness for several hours.   Patient Reports nocturnal pain.  Difficulty dressing/grooming: Denies Difficulty climbing stairs: Denies Difficulty getting out of chair: Denies Difficulty using hands for taps, buttons, cutlery, and/or writing: Reports  Review of Systems  Constitutional: Positive for appetite change and fatigue.  HENT: Positive for mouth sores. Negative for mouth  dryness and nose dryness.   Eyes: Negative for pain, itching and dryness.  Respiratory: Negative for shortness of breath and difficulty breathing.   Cardiovascular: Negative for chest pain and palpitations.  Gastrointestinal: Positive for nausea. Negative for blood in stool, constipation, diarrhea and vomiting.  Endocrine: Negative for increased urination.  Genitourinary: Negative for difficulty urinating.  Musculoskeletal: Positive for arthralgias, joint pain, joint swelling, myalgias, morning stiffness, muscle tenderness and myalgias.  Skin: Negative for color change, rash and redness.  Allergic/Immunologic: Negative for susceptible to infections.  Neurological: Negative for dizziness, numbness, headaches, memory loss and weakness.  Hematological: Negative for bruising/bleeding tendency.  Psychiatric/Behavioral: Positive for sleep disturbance. Negative for confusion.    PMFS History:  Patient Active Problem List   Diagnosis Date Noted  . Back pain 12/13/2019  . Rheumatoid arthritis ([place]) 12/02/2019  . Anxiety and depression 08/01/2019  . Arthralgia 05/04/2019  . OSA (obstructive sleep apnea) 05/03/2019  . Thyroiditis 12/20/2018  . Muscle cramp 12/20/2018  . Eustachian tube dysfunction 07/27/2017  . Headache 11/25/2015  . Acute bacterial sinusitis 03/26/2015  . Hematuria 04/09/2014  . Hyperglycemia 04/06/2014  . Diarrhea 09/21/2013  . Hyperparathyroidism, primary ([place]) 07/12/2013  . Dyspnea 12/28/2012  . Hypercalcemia 12/16/2012  . H/O tobacco use, presenting hazards to health 11/29/2012  . Overactive bladder 05/31/2012  . Elevated LFTs 05/31/2012  . Obesity 05/31/2012  . Allergic rhinitis 05/31/2012  . Hoarseness 02/04/2012  .  Preventative health care 02/04/2012  . SUI (stress urinary incontinence, female) 02/04/2012  . Migraine   . GERD (gastroesophageal reflux disease)   . Rotator cuff syndrome of right shoulder 06/11/2011  . Atypical chest pain 01/03/2011  .  Hypertension 01/03/2011  . Hyperlipidemia 01/03/2011  . KNEE PAIN, BILATERAL 09/28/2008  . ANSERINE BURSITIS 09/28/2008  . 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 Allergic rhinitis 05/31/2012  . Chest pain    PT SAW CARDIOLOGIST DR. Burt Knack - STRESS ECHO DONE NEGATIVE - NO PROBLEM SINCE  . Chicken pox as achild  . Cough 11/29/2012   RESOLVED - IT WAS CAUSED BY LISINOPRIL  . Elevated LFTs 05/31/2012  . Eustachian tube dysfunction 07/27/2017  . GERD (gastroesophageal reflux disease)   . H/O tobacco use, presenting hazards to health 11/29/2012   Smoked 1 1/2 ppd for 20 years quit in roughtly 2007  . Headache 11/25/2015  . Hematuria 04/09/2014  . Hoarseness 02/04/2012   RESOLVED - THOUGHT TO HAVE BEEN ALLERY RELATED  . Hyperglycemia 04/06/2014  . Hyperlipidemia   . Hyperparathyroidism ([place])   . Hypertension   . Obesity, unspecified 05/31/2012  . Ocular migraine   . Overactive bladder 05/31/2012  . Preventative health care 02/04/2012  . PTSD (post-traumatic stress disorder)   . Rapid heart beat    PT FEELS PROB RELATED TO HYPER PARATHYROIDISM  . Rheumatoid arthritis ([place])   . SUI (stress urinary incontinence, female) 02/04/2012   Sees Dr Perlie Gold and they have discussed a bladder tack but so far she declines    Family History  Problem Relation Age of Onset  . Hypertension Mother 33       alive  . Renal cancer Mother        renal  . Hyperlipidemia Mother   . Osteoporosis Mother        humerus   . Macular degeneration Mother   . Tremor Mother   . Parkinson's disease Mother   . Hypertension Father 29       alive  . Atrial fibrillation Father   . Colon cancer Father        colon  . Hyperlipidemia Father   . Stroke Father   . Alzheimer's disease Father   . Hypertension Brother 53       alive  . Tremor Brother   . Hypertension Sister   . Lung cancer Maternal Grandfather        lung/ smoker  . Emphysema Maternal Grandfather    . Heart disease Paternal Grandmother   . Other Paternal Grandfather        black lung  . Heart disease Paternal Grandfather   . Asthma Son   . Eczema Son   . Healthy Son   . Healthy Son   . Heart disease Paternal Aunt   . Heart disease Paternal Uncle    Past Surgical History:  Procedure Laterality Date  . BREAST EXCISIONAL BIOPSY Left    Age 68 Fibroadenoma  . BREAST SURGERY     left breast excision of adenoma  . btl    . PARATHYROIDECTOMY Right 08/12/2013   Procedure: right inferior frozen section PARATHYROIDECTOMY;  Surgeon: Earnstine Regal, MD;  Location: [facility];  Service: General;  Laterality: Right;  . PELVIC LAPAROSCOPY  03-2004  . TOTAL SHOULDER REPLACEMENT Right 01/2019  . TUBAL LIGATION    . WISDOM TOOTH EXTRACTION  30 yrs ago   Social History  Social History Narrative  . Not on file   Immunization History  Administered Date(s) Administered  . Hepatitis A, Adult 09/04/2016, 03/27/2017  . Influenza Split 12/09/2011  . Influenza,inj,Quad PF,6+ Mos 11/29/2012, 11/01/2013, 11/20/2015, 12/29/2017, 04/03/2019, 12/13/2019  . PFIZER SARS-COV-2 Vaccination 05/20/2019, 06/15/2019, 01/11/2020  . Pneumococcal Polysaccharide-23 08/01/2019  . Tdap 06/07/2015  . Zoster Recombinat (Shingrix) 08/01/2019     Objective: Vital Signs: BP (!) 160/105 (BP Location: Left Arm, Patient Position: Sitting, Cuff Size: Normal)   Pulse 77   Resp 15   Ht 5\' 5"  (1.651 m)   Wt 204 lb 3.2 oz (92.6 kg)   LMP 04/01/2011   BMI 33.98 kg/m    Physical Exam Vitals and nursing note reviewed.  Constitutional:      Appearance: She is well-developed and well-nourished.  HENT:     Head: Normocephalic and atraumatic.  Eyes:     Extraocular Movements: EOM normal.     Conjunctiva/sclera: Conjunctivae normal.  Cardiovascular:     Pulses: Intact distal pulses.  Pulmonary:     Effort: Pulmonary effort is normal.  Abdominal:     Palpations: Abdomen is soft.  Musculoskeletal:     Cervical back:  Normal range of motion.  Skin:    General: Skin is warm and dry.     Capillary Refill: Capillary refill takes less than 2 seconds.  Neurological:     Mental Status: She is alert and oriented to person, place, and time.  Psychiatric:        Mood and Affect: Mood and affect normal.        Behavior: Behavior normal.      Musculoskeletal Exam: C-spine, thoracic spine, and lumbar spine good ROM.  Shoulder joints good ROM with no discomfort.  Elbow joints good ROM with tenderness over the joint line of the left elbow. Wrist joints, MCPs, PIPs ,and DIPs good ROM.  Tenderness over both wrist joints.  Tenderness over right 2nd and 4th MCP joints and PIP joints.  Hip joints good ROM with some stiffness in the left hip.  Knee joints good ROM with no discomfort.  No warmth or effusion of knee joints.  Tenderness and swelling of the right ankle joint.  Tenderness over the left ankle. Tenderness of all MTP joints.    CDAI Exam: CDAI Score: -- Patient Global: --; Provider Global: -- Swollen: 1 ; Tender: 22  Joint Exam 02/22/2020      Right  Left  Elbow      Tender  Wrist   Tender   Tender  CMC   Tender   Tender  MCP 2   Tender     MCP 4   Tender     PIP 2   Tender     PIP 4   Tender     Ankle  Swollen Tender     Tarsometatarsal   Tender   Tender  MTP 1   Tender   Tender  MTP 2   Tender   Tender  MTP 3   Tender   Tender  MTP 4   Tender   Tender  MTP 5   Tender   Tender     Investigation: No additional findings.  Imaging: No results found.  Recent Labs: Lab Results  Component Value Date   WBC 5.7 01/24/2020   HGB 14.4 01/24/2020   PLT 279 01/24/2020   NA 139 01/24/2020   K 5.2 01/24/2020   CL 101 01/24/2020   CO2 28 01/24/2020  GLUCOSE 89 01/24/2020   BUN 14 01/24/2020   CREATININE 0.74 01/24/2020   BILITOT 0.3 01/24/2020   ALKPHOS 110 05/03/2019   AST 24 01/24/2020   ALT 30 (H) 01/24/2020   PROT 7.5 01/24/2020   ALBUMIN 4.6 05/03/2019   CALCIUM 10.4 01/24/2020   GFRAA  103 01/24/2020   QFTBGOLDPLUS NEGATIVE 07/26/2019    Speciality Comments: No specialty comments available.  Procedures:  No procedures performed Allergies: Cefdinir, Doxycycline, Codeine, Penicillins, and Simvastatin   Assessment / Plan:     Visit Diagnoses: Rheumatoid arthritis of multiple sites with negative rheumatoid factor [facility]): She presents today with severe pain and stiffness in multiple joints.  She has tenderness palpation over the joint line of the left elbow, bilateral wrist joints, right second and fourth MCP and PIP joints, both ankle joints, and all MTP joints.  She has warmth and swelling of the right ankle joint on exam today.  She has been experiencing recurrent flares despite starting on Humira on 11/23/19 and Arava after OV on 01/10/20.  She is currently on a prednisone taper but rates her arthritis a 7 out of 10.  She has been experiencing severe nocturnal pain and difficulty with ADLs.  We will be extending her work leave until 04/23/20 and will reevaluate at that time.  Discussed different treatment options today in detail.  She will continue on [place] as prescribed to allow more time to obtain the full benefits.  She will be switching from Humira to enbrel.  Indications, contraindications, and potential side effects of Enbrel were discussed.  We will be applying for enbrel mini 50 mg sq injections once weekly.  All questions were addressed and consent was obtained.  She will be able to start on enbrel in the office on or after 02/29/20 (last dose of humira was on 02/15/20).  A prednisone taper staring at 20 mg tapering by 5 mg every 7 days was sent to the pharmacy.  She will follow up in the office in 6-8 weeks to assess her response to combination therapy with Enbrel and Arava.    Medication counseling:   TB Test: negative 07/26/19 Hepatitis panel: Negative 07/26/19 HIV: Negative on 07/26/19 SPEP: No abnormal proteins detected 07/26/19 Immunoglobulins: WNL on 07/26/19  Chest x-ray:  07/26/19  Does patient have diagnosis of heart failure?  No  Counseled patient that Enbrel is a TNF blocking agent.  Reviewed Enbrel dose of 50 mg once weekly.  Counseled patient on purpose, proper use, and adverse effects of Enbrel.  Reviewed the most common adverse effects including infections, headache, and injection site reactions. Discussed that there is the possibility of an increased risk of malignancy but it is not well understood if this increased risk is due to the medication or the disease state.  Advised patient to get yearly dermatology exams due to risk of skin cancer.  Reviewed the importance of regular labs while on Enbrel therapy.  Advised patient to get standing labs one month after starting Enbrel then every 2 months.  Provided patient with standing lab orders.  Counseled patient that Enbrel should be held prior to scheduled surgery.  Counseled patient to avoid live vaccines while on Enbrel.  Advised patient to get annual influenza vaccine and the pneumococcal vaccine as needed.  Provided patient with medication education material and answered all questions.  Patient voiced understanding.  Patient consented to Enbrel.  Will upload consent into the media tab.  Reviewed storage instructions for Enbrel.  Advised initial injection must be  administered in office.  Patient voiced understanding.    High risk medication use: Applying for Enbrel 50 mg subcutaneous injections once weekly.  Patient can be started on Enbrel on or after 02/29/2020.  She will continue on Arava 20 mg 1 tablet by mouth daily.  Discontinuing Humira due to inadequate response (11/23/19-02/15/20).  CBC and CMP were updated on 01/24/2020.  She will return for lab work 1 month and then every 3 months after starting on Enbrel.  Standing orders for CBC and CMP remain in place.  TB gold negative on 07/26/2019 and will continue to be monitored yearly. Chest x-ray on 07/26/2019 no acute cardiopulmonary abnormality noted. She has not had  any recent infections.  She has received both COVID-19 vaccinations and the booster dose.  Discussed the importance of holding Enbrel and Arava if she develops signs or symptoms of an infection and to resume once the infection has completely cleared. Discussed the importance of yearly skin exams while on an anti-TNF inhibitor.  Status post total shoulder replacement, right: Doing well.  She has good ROM on exam today.  Primary osteoarthritis of both hands: She has PIP and DIP thickening consistent with osteoarthritis of both hands.  She has tenderness over the right second and fourth PIP joints.  Bilateral CMC joint tenderness noted.  She has been experiencing increased discomfort and stiffness in both hands over the past several weeks.  Primary osteoarthritis of both feet: She has been experiencing severe pain in both feet.  She states that at times it feels as though she is walking on gravel.  She has painful range of motion of both ankle joints with tenderness to palpation.  Warmth and swelling of the right ankle joint was noted.  She has tenderness of all MTP joints.  We discussed the importance of wearing proper fitting shoes.  Other medical conditions are listed as follows:  Essential hypertension  Hashimoto's thyroiditis  Hyperparathyroidism, primary ([place])  History of hyperlipidemia  History of gastroesophageal reflux (GERD)  Hx of migraines  Former smoker  Anxiety and depression  History of miscarriage  Orders: No orders of the defined types were placed in this encounter.  Meds ordered this encounter  Medications  . predniSONE (DELTASONE) 5 MG tablet    Sig: Take 4 tablets by mouth daily x1wk, 3 tablets by mouth daily x1wk, 2 tablets by mouth daily x1wk, 1 tablet by mouth daily x1wk.    Dispense:  70 tablet    Refill:  0     Follow-Up Instructions: Return for Rheumatoid arthritis.   Ofilia Neas, PA-C  Note - This record has been created using Dragon software.   Chart creation errors have been sought, but may not always  have been located. Such creation errors do not reflect on  the standard of medical care.

## 2020-02-22 NOTE — Telephone Encounter (Signed)
Rachael - could you please start Enbrel BIV? Dose would be 50 mg weekly.  Dx code RA (M06.9) Previous tried and failed therapy: Humira, injectable methotrexate  I discussed the debit card/savings program with her since she has Pharmacist, community. Provided phone number for her to call.

## 2020-02-22 NOTE — Telephone Encounter (Signed)
No active prescription coverage was found for patient.  Called RX services number on the back of patient's card and they only see cash discount coverage and flu clinic for patient.  Called patient to advise- she has been on medical leave and paying her premiums, so she did not expect any insurance changes, she will contact her insurance to see what is going on.   Discussed [employer] patient assistance if she is uninsured.  Advised patient to contact office to advise of findings.

## 2020-02-23 NOTE — Telephone Encounter (Signed)
Patient is currently having an issues with her insurance company and is in the process of waiting for them to re-instate her coverage. They estimate 3-5 business days, but patient said she was not confident in that timeframe.  We did discuss [employer] patient assistance, but patient states her household income is way over their income guidelines.  Patient is requesting a call to discuss alternate treatment options in case it takes longer for her insurance to be fixed. She states she currently on a prednisone taper.

## 2020-02-23 NOTE — Telephone Encounter (Signed)
I called the patient to discuss the current situation and delay in starting enbrel.   She will be starting on a prednisone taper tomorrow (20 mg tapering by 5 mg every 1wk), which should alleviate some of the joint pain and inflammation in the meantime.  She does not plan on injecting humira on the 02/29/20 because she does not want to further delay starting on enbrel once it is approved.  She plans on calling her insurance company on a daily basis for an update and will notify us once things have been corrected.

## 2020-02-23 NOTE — Telephone Encounter (Signed)
Received message from patient that there was an error with her insurance and they predict it should be fixed in 3-5 days.  Unable to start Enbrel PA until insurance is active.  Will keep checking for insurance coverage, to initiate PA.

## 2020-02-29 ENCOUNTER — Ambulatory Visit: Payer: No Typology Code available for payment source | Admitting: Adult Health

## 2020-02-29 NOTE — Telephone Encounter (Signed)
Ran eligibility check, still no active insurance showing. Will try again on Monday

## 2020-03-05 NOTE — Telephone Encounter (Signed)
Still no active coverage in eligibility check. Will continue to check. 

## 2020-03-06 NOTE — Telephone Encounter (Signed)
Still no active coverage in eligibility check. Will continue to check.

## 2020-03-08 NOTE — Telephone Encounter (Signed)
Submitted a Prior Authorization request to [facility] for ENBREL via Fax. Will update once we receive a response.   PA# [id number]  Phone# [phone]

## 2020-03-08 NOTE — Telephone Encounter (Signed)
Called [facility], they will fax over Enbrel PA form.  Phone# [phone]  Called patient to advise, no answer and mailbox is full.

## 2020-03-08 NOTE — Telephone Encounter (Signed)
Patient's insurance is now showing as active. Unable to submit PA through [facility]. Will call plan to initiate PA if needed.  ID# [id number]  Phone# [phone]

## 2020-03-12 NOTE — Telephone Encounter (Signed)
Patient must fill through [facility]. Patient can complete enrollment for Enbrel Copay card

## 2020-03-12 NOTE — Telephone Encounter (Signed)
Received notification from [facility] regarding a prior authorization for ENBREL. Authorization has been APPROVED from 03/08/20 to 03/08/21.   Authorization # [id number] Phone # [phone]

## 2020-03-14 ENCOUNTER — Telehealth: Payer: Self-pay

## 2020-03-14 ENCOUNTER — Other Ambulatory Visit: Payer: Self-pay | Admitting: Physician Assistant

## 2020-03-14 NOTE — Telephone Encounter (Signed)
Patient received copay card in mail. Advised that no prescription has been sent yet b/c we'd like to ensure she's comfortable with administering and doesn't have any adverse reaction. Advised that we sent Rx after she receives first dose in clinic. She verbalized understanding.  Nothing further needed.  Chesley Mires, PharmD, MPH Clinical Pharmacist (Rheumatology and Pulmonology)

## 2020-03-14 NOTE — Telephone Encounter (Signed)
Patient called stating she is having difficulty using her co-pay card to order her prescription of Enbrel for next week.  Patient states she is scheduled for her first injection tomorrow 03/15/20.  Patient is requesting a return call.

## 2020-03-14 NOTE — Telephone Encounter (Signed)
Patient advised about copay card needing to be mailed to her since it's similar to a debit card, so it's best to start Enbrel after she's enrolled in Enbrel copay card.  Scheduled for Enbrel new start on 03/15/20 with pharmacist.   Chesley Mires, PharmD, MPH Clinical Pharmacist (Rheumatology and Pulmonology)

## 2020-03-15 ENCOUNTER — Other Ambulatory Visit: Payer: Self-pay

## 2020-03-15 ENCOUNTER — Ambulatory Visit: Payer: No Typology Code available for payment source | Admitting: Pharmacist

## 2020-03-15 VITALS — BP 135/89 | HR 101

## 2020-03-15 DIAGNOSIS — Z79899 Other long term (current) drug therapy: Secondary | ICD-10-CM

## 2020-03-15 DIAGNOSIS — M0609 Rheumatoid arthritis without rheumatoid factor, multiple sites: Secondary | ICD-10-CM

## 2020-03-15 MED ORDER — ENBREL MINI 50 MG/ML ~~LOC~~ SOCT: 50.0000 mg | SUBCUTANEOUS | 0 refills | Status: DC

## 2020-03-15 NOTE — Progress Notes (Signed)
Pharmacy Note  Subjective:   Patient presents to clinic today to receive first dose of Enbrel Mini 50mg /mL. Reports feeling well. No flare-ups since stopping Humira, likely due to have prednisone taper prescribed by , PA-C, while insurance was being re-activated.  Patient running a fever or have signs/symptoms of infection? No  Patient currently on antibiotics for the treatment of infection? No  Patient have any upcoming invasive procedures/surgeries? No  Objective: CMP     Component Value Date/Time   NA 139 01/24/2020 1343   K 5.2 01/24/2020 1343   CL 101 01/24/2020 1343   CO2 28 01/24/2020 1343   GLUCOSE 89 01/24/2020 1343   BUN 14 01/24/2020 1343   CREATININE 0.74 01/24/2020 1343   CALCIUM 10.4 01/24/2020 1343   PROT 7.5 01/24/2020 1343   ALBUMIN 4.6 05/03/2019 1034   AST 24 01/24/2020 1343   ALT 30 (H) 01/24/2020 1343   ALKPHOS 110 05/03/2019 1034   BILITOT 0.3 01/24/2020 1343   GFRNONAA 89 01/24/2020 1343   GFRAA 103 01/24/2020 1343    CBC    Component Value Date/Time   WBC 5.7 01/24/2020 1343   RBC 4.82 01/24/2020 1343   HGB 14.4 01/24/2020 1343   HCT 43.3 01/24/2020 1343   PLT 279 01/24/2020 1343   MCV 89.8 01/24/2020 1343   MCH 29.9 01/24/2020 1343   MCHC 33.3 01/24/2020 1343   RDW 13.1 01/24/2020 1343   LYMPHSABS 2,514 01/24/2020 1343   MONOABS 0.4 04/18/2015 0736   EOSABS 80 01/24/2020 1343   BASOSABS 40 01/24/2020 1343    Baseline Immunosuppressant Therapy Labs TB GOLD Quantiferon TB Gold Latest Ref Rng & Units 07/26/2019  Quantiferon TB Gold Plus NEGATIVE NEGATIVE   Hepatitis Panel Hepatitis Latest Ref Rng & Units 07/26/2019  Hep B Surface Ag NON-REACTI NON-REACTIVE  Hep B IgM NON-REACTI NON-REACTIVE  Hep C Ab NON-REACTI NON-REACTIVE  Hep C Ab NON-REACTI NON-REACTIVE  Hep A IgM NON REACTIVE -   HIV Lab Results  Component Value Date   HIV NON-REACTIVE 07/26/2019   Immunoglobulins Immunoglobulin Electrophoresis Latest Ref Rng &  Units 07/26/2019  IgA  47 - 310 mg/dL 07/28/2019  IgG 010 - 272 mg/dL 5,366  IgM 50 - 4,403 mg/dL 474   SPEP Serum Protein Electrophoresis Latest Ref Rng & Units 01/24/2020  Total Protein 6.1 - 8.1 g/dL 7.5  Albumin 3.8 - 4.8 g/dL -  Alpha-1 0.2 - 0.3 g/dL -  Alpha-2 0.5 - 0.9 g/dL -  Beta Globulin 0.4 - 0.6 g/dL -  Beta 2 0.2 - 0.5 g/dL -  Gamma Globulin 0.8 - 1.7 g/dL -    Chest x-ray: 01/26/2020 ([facility])   Assessment/Plan:  Enbrel dose: 50mg /mL (Mini cartridge) SQ every 7 days  Counseled patient that Humira is a TNF blocking agent.  Counseled patient on purpose, proper use, and adverse effects of Humira.  Reviewed the most common adverse effects including infections, headache, and injection site reactions. Discussed that there is the possibility of an increased risk of malignancy but it is not well understood if this increased risk is due to the medication or the disease state.  Advised patient to get yearly dermatology exams due to risk of skin cancer. Counseled patient that Humira should be held prior to scheduled surgery.  Counseled patient to avoid live vaccines while on Humira.  Advised patient to get annual influenza vaccine and the pneumococcal vaccine as indicated.    Demonstrated proper injection technique with Enbrel Mini and Autotouch demo device Patient  able to demonstrate proper injection technique using the teach back method. Patient self injected in the stomach with:  Sample Medication: Enbrel AutoTouch (given to patient to take home and advised about battery life and phone number to call to replace) Lot: [id number] Expiration: 06/07/2021  Sample Medication: Enbrel Mini 50mg /mL cartridge NDC: [id number] Lot: [id number] Expiration: 06/2021  Patient tolerated well.  Observed for 30 mins in office for adverse reaction.   Patient is to return in 1 month for labs when she has f/u appointment with Dr. Estanislado Pandy on 04/16/20.  Standing orders for CBC with diff/plt and CMP with GFR placed. TB  gold due in May 2022.  Enbrel Mini approved through insurance.  Prescription sent to [facility]. Patient has Enbrel copay/debit card in clinic with her and plans to call pharmacy today to schedule shipment and provide debit card information. She plans to schedule Enbrel to be self-administered every Thursday, starting 03/15/20 (given in clinic), then 03/22/20 and every 7 days thereafter. Patient provided with [facility] phone number. Advised to call when she has 1-2 pens left to schedule next shipment.  Reviewed that Enbrel cartridge is good at room temp for 14 days but cannot be returned to refirgerator.  Patient signed consent at last clinic visit but not yet in media tab, so we chose to complete consent form again.  Patient will continue yearly dermatology checks with established dermatologist.  All questions encouraged and answered.  Instructed patient to call with any further questions or concerns.  Knox Saliva, PharmD, MPH Clinical Pharmacist (Rheumatology and Pulmonology)  03/15/2020 8:45 AM

## 2020-03-15 NOTE — Patient Instructions (Addendum)
Enbrel 50mg  (1 cartridge) every 7 days. Please mark the day in your calendar/phone.  The AutoTouch device battery is good for about one year. Please call Enbrel at [phone] to replace it when the low battery sign pops up.  Next doses due: 03/22/20, 03/29/20, 04/05/20, then every 7 days thereafter  I've sent the prescription to [facility]: [phone]. Please provide them with your copay/debit savings card information.  Standing Labs We placed an order today for your standing lab work.   Please have your standing labs drawn in 1 month, then every 3 months thereafter.  If possible, please have your labs drawn 2 weeks prior to your appointment so that the provider can discuss your results at your appointment.  We have open lab daily Monday through Thursday from 8:30-12:30 PM and 1:30-4:30 PM and Friday from 8:30-12:30 PM and 1:30-4:00 PM at the office of Dr. Friday, [facility].   Please be advised, patients with office appointments requiring lab work will take precedents over walk-in lab work.  If possible, please come for your lab work on Monday and Friday afternoons, as you may experience shorter wait times. The office is located at [address], [address], [place], [place] No appointment is necessary.   Labs are drawn by [facility]. Please bring your co-pay at the time of your lab draw.  You may receive a bill from [facility] for your lab work.  If you wish to have your labs drawn at another location, please call the office 24 hours in advance to send orders.  If you have any questions regarding directions or hours of operation,  please call [phone].   As a reminder, please drink plenty of water prior to coming for your lab work. Thanks!  In addition to staying up to date on lab work and vaccines it is important to:  [phone] Yearly skin checks with dermatologist due to increase risk in skin cancer with TNF inhibitors .

## 2020-03-16 ENCOUNTER — Telehealth: Payer: Self-pay | Admitting: Rheumatology

## 2020-03-16 ENCOUNTER — Telehealth: Payer: Self-pay

## 2020-03-16 NOTE — Telephone Encounter (Signed)
Calling in reference to Enbrel Mini. We started PA 03/08/2020, and we needed to call insurance company, Was this done, does it still need PA? Please call to advise. Reference # [facility]

## 2020-03-16 NOTE — Telephone Encounter (Signed)
Please disregard previous message. Patient spoke with speciality pharmacy, and got everything straightened out. A shipment has been scheduled for her medication.

## 2020-03-16 NOTE — Telephone Encounter (Signed)
Patient called stating she called [facility] to order her prescription of Enbrel and was told they don't have a prescription on file for her.  Patient is requesting the prescription be sent ASAP so she can schedule delivery to receive in her medication on time.

## 2020-04-02 NOTE — Progress Notes (Deleted)
Office Visit Note  Patient: Sandra [name]             Date of Birth: [birth year]           MRN: [id number]             PCP: [name], MD Referring: [name], MD Visit Date: 04/16/2020 Occupation: @GUAROCC @  Subjective:  No chief complaint on file.   History of Present Illness: Sandra [name] is a 60 y.o. female ***   Activities of Daily Living:  Patient reports morning stiffness for *** {minute/hour:19697}.   Patient {ACTIONS;DENIES/REPORTS:21021675::"Denies"} nocturnal pain.  Difficulty dressing/grooming: {ACTIONS;DENIES/REPORTS:21021675::"Denies"} Difficulty climbing stairs: {ACTIONS;DENIES/REPORTS:21021675::"Denies"} Difficulty getting out of chair: {ACTIONS;DENIES/REPORTS:21021675::"Denies"} Difficulty using hands for taps, buttons, cutlery, and/or writing: {ACTIONS;DENIES/REPORTS:21021675::"Denies"}  No Rheumatology ROS completed.   PMFS History:  Patient Active Problem List   Diagnosis Date Noted  . Back pain 12/13/2019  . Rheumatoid arthritis ([place]) 12/02/2019  . Anxiety and depression 08/01/2019  . Arthralgia 05/04/2019  . OSA (obstructive sleep apnea) 05/03/2019  . Thyroiditis 12/20/2018  . Muscle cramp 12/20/2018  . Eustachian tube dysfunction 07/27/2017  . Headache 11/25/2015  . Acute bacterial sinusitis 03/26/2015  . Hematuria 04/09/2014  . Hyperglycemia 04/06/2014  . Diarrhea 09/21/2013  . Hyperparathyroidism, primary ([place]) 07/12/2013  . Dyspnea 12/28/2012  . Hypercalcemia 12/16/2012  . H/O tobacco use, presenting hazards to health 11/29/2012  . Overactive bladder 05/31/2012  . Elevated LFTs 05/31/2012  . Obesity 05/31/2012  . Allergic rhinitis 05/31/2012  . Hoarseness 02/04/2012  . Preventative health care 02/04/2012  . SUI (stress urinary incontinence, female) 02/04/2012  . Migraine   . GERD (gastroesophageal reflux disease)   . Rotator cuff syndrome of right shoulder 06/11/2011  . Atypical chest pain 01/03/2011  .  Hypertension 01/03/2011  . Hyperlipidemia 01/03/2011  . KNEE PAIN, BILATERAL 09/28/2008  . ANSERINE BURSITIS 09/28/2008  . 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 Allergic rhinitis 05/31/2012  . Chest pain    PT SAW CARDIOLOGIST DR. Burt Knack - STRESS ECHO DONE NEGATIVE - NO PROBLEM SINCE  . Chicken pox as achild  . Cough 11/29/2012   RESOLVED - IT WAS CAUSED BY LISINOPRIL  . Elevated LFTs 05/31/2012  . Eustachian tube dysfunction 07/27/2017  . GERD (gastroesophageal reflux disease)   . H/O tobacco use, presenting hazards to health 11/29/2012   Smoked 1 1/2 ppd for 20 years quit in roughtly 2007  . Headache 11/25/2015  . Hematuria 04/09/2014  . Hoarseness 02/04/2012   RESOLVED - THOUGHT TO HAVE BEEN ALLERY RELATED  . Hyperglycemia 04/06/2014  . Hyperlipidemia   . Hyperparathyroidism ([place])   . Hypertension   . Obesity, unspecified 05/31/2012  . Ocular migraine   . Overactive bladder 05/31/2012  . Preventative health care 02/04/2012  . PTSD (post-traumatic stress disorder)   . Rapid heart beat    PT FEELS PROB RELATED TO HYPER PARATHYROIDISM  . Rheumatoid arthritis ([place])   . SUI (stress urinary incontinence, female) 02/04/2012   Sees Dr Perlie Gold and they have discussed a bladder tack but so far she declines    Family History  Problem Relation Age of Onset  . Hypertension Mother 45       alive  . Renal cancer Mother        renal  . Hyperlipidemia Mother   . Osteoporosis Mother        humerus   . Macular degeneration Mother   .  Tremor Mother   . Parkinson's disease Mother   . Hypertension Father 71       alive  . Atrial fibrillation Father   . Colon cancer Father        colon  . Hyperlipidemia Father   . Stroke Father   . Alzheimer's disease Father   . Hypertension Brother 47       alive  . Tremor Brother   . Hypertension Sister   . Lung cancer Maternal Grandfather        lung/ smoker  . Emphysema Maternal Grandfather    . Heart disease Paternal Grandmother   . Other Paternal Grandfather        black lung  . Heart disease Paternal Grandfather   . Asthma Son   . Eczema Son   . Healthy Son   . Healthy Son   . Heart disease Paternal Aunt   . Heart disease Paternal Uncle    Past Surgical History:  Procedure Laterality Date  . BREAST EXCISIONAL BIOPSY Left    Age 24 Fibroadenoma  . BREAST SURGERY     left breast excision of adenoma  . btl    . PARATHYROIDECTOMY Right 08/12/2013   Procedure: right inferior frozen section PARATHYROIDECTOMY;  Surgeon: Earnstine Regal, MD;  Location: [facility];  Service: General;  Laterality: Right;  . PELVIC LAPAROSCOPY  03-2004  . TOTAL SHOULDER REPLACEMENT Right 01/2019  . TUBAL LIGATION    . WISDOM TOOTH EXTRACTION  30 yrs ago   Social History   Social History Narrative  . Not on file   Immunization History  Administered Date(s) Administered  . Hepatitis A, Adult 09/04/2016, 03/27/2017  . Influenza Split 12/09/2011  . Influenza,inj,Quad PF,6+ Mos 11/29/2012, 11/01/2013, 11/20/2015, 12/29/2017, 04/03/2019, 12/13/2019  . PFIZER(Purple Top)SARS-COV-2 Vaccination 05/20/2019, 06/15/2019, 01/11/2020  . Pneumococcal Polysaccharide-23 08/01/2019  . Tdap 06/07/2015  . Zoster Recombinat (Shingrix) 08/01/2019     Objective: Vital Signs: LMP 04/01/2011    Physical Exam   Musculoskeletal Exam: ***  CDAI Exam: CDAI Score: -- Patient Global: --; Provider Global: -- Swollen: --; Tender: -- Joint Exam 04/16/2020   No joint exam has been documented for this visit   There is currently no information documented on the homunculus. Go to the Rheumatology activity and complete the homunculus joint exam.  Investigation: No additional findings.  Imaging: No results found.  Recent Labs: Lab Results  Component Value Date   WBC 5.7 01/24/2020   HGB 14.4 01/24/2020   PLT 279 01/24/2020   NA 139 01/24/2020   K 5.2 01/24/2020   CL 101 01/24/2020   CO2 28 01/24/2020    GLUCOSE 89 01/24/2020   BUN 14 01/24/2020   CREATININE 0.74 01/24/2020   BILITOT 0.3 01/24/2020   ALKPHOS 110 05/03/2019   AST 24 01/24/2020   ALT 30 (H) 01/24/2020   PROT 7.5 01/24/2020   ALBUMIN 4.6 05/03/2019   CALCIUM 10.4 01/24/2020   GFRAA 103 01/24/2020   QFTBGOLDPLUS NEGATIVE 07/26/2019    Speciality Comments: No specialty comments available.  Procedures:  No procedures performed Allergies: Cefdinir, Doxycycline, Codeine, Penicillins, and Simvastatin   Assessment / Plan:     Visit Diagnoses: No diagnosis found.  Orders: No orders of the defined types were placed in this encounter.  No orders of the defined types were placed in this encounter.   Face-to-face time spent with patient was *** minutes. Greater than 50% of time was spent in counseling and coordination of care.  Follow-Up Instructions:  No follow-ups on file.   Earnestine Mealing, CMA  Note - This record has been created using Editor, commissioning.  Chart creation errors have been sought, but may not always  have been located. Such creation errors do not reflect on  the standard of medical care.

## 2020-04-03 ENCOUNTER — Other Ambulatory Visit: Payer: Self-pay | Admitting: Rheumatology

## 2020-04-03 NOTE — Telephone Encounter (Signed)
Last Visit: 02/22/2020 Next Visit: 04/16/2020 Labs: 01/24/2020, ALT 30,   Current Dose per office note 02/22/2020, Arava 20 mg 1 tablet by mouth daily. DX: Rheumatoid arthritis of multiple sites with negative rheumatoid factor   Okay to refill Arava?

## 2020-04-07 ENCOUNTER — Other Ambulatory Visit: Payer: Self-pay | Admitting: Family Medicine

## 2020-04-09 ENCOUNTER — Other Ambulatory Visit: Payer: Self-pay

## 2020-04-09 ENCOUNTER — Encounter: Payer: Self-pay | Admitting: Adult Health

## 2020-04-09 ENCOUNTER — Ambulatory Visit (INDEPENDENT_AMBULATORY_CARE_PROVIDER_SITE_OTHER): Payer: No Typology Code available for payment source | Admitting: Adult Health

## 2020-04-09 DIAGNOSIS — G4733 Obstructive sleep apnea (adult) (pediatric): Secondary | ICD-10-CM

## 2020-04-09 DIAGNOSIS — E669 Obesity, unspecified: Secondary | ICD-10-CM | POA: Diagnosis not present

## 2020-04-09 NOTE — Patient Instructions (Signed)
Begin CPAP At bedtime   Wear all night long .  Work on healthy weight.  Touch base with our office if no reply from home care weekly .  Do not drive if sleepy  Follow up with Dr. Elsworth Soho  Or Keldrick Pomplun NP and As needed   Please contact office for sooner follow up if symptoms do not improve or worsen or seek emergency care

## 2020-04-09 NOTE — Assessment & Plan Note (Signed)
Moderate OSA -needs to start CPAP  DME contacted on status of CPAP , awaiting reply back   Plan  Patient Instructions  Begin CPAP At bedtime   Wear all night long .  Work on healthy weight.  Touch base with our office if no reply from home care weekly .  Do not drive if sleepy  Follow up with Dr. Elsworth Soho  Or Nazia Rhines NP and As needed   Please contact office for sooner follow up if symptoms do not improve or worsen or seek emergency care

## 2020-04-09 NOTE — Assessment & Plan Note (Signed)
Healthy weight loss discussed 

## 2020-04-09 NOTE — Progress Notes (Signed)
@Patient  ID: [name], female    DOB: 20-Aug-1960, 60 y.o.   MRN: [id number]  Chief Complaint  Patient presents with  . Sleep Apnea    Referring provider: [name], MD  HPI: 60 year old female former smoker followed for shortness of breath and obstructive sleep apnea Medical history significant for rheumatoid arthritis on Enbrel  and hypercalcemia status post parathyroid adenoma resection, Hashimoto's thyroiditis, TIA presenting as hemianopia -migraine  Son has Sarcoma on chemo. She is a Therapist, sports .   TEST/EVENTS :  05/2019 [facility] neurology -told  Moderate OSA   HRCT 08/2019 >> Bibasilar scarring. No evidence of interstitial lung disease.  Mild air trapping, indicative of small airways disease.mild emphysema   2014 -Spirometrynoairway obstruction,FEV1 2.37-85% and ratio of 83.  04/09/2020 Follow up : Obstructive sleep apnea Patient returns for a 3 month follow-up visit for obstructive sleep apnea.  Patient was seen by neurology at [facility] in March 2021.  Was diagnosed with moderate obstructive sleep apnea.   She was recommended for CPAP .   Patient returns today unfortunately has still not received her CPAP.  We have contacted her local DME to check on the CPAP progress. Has Daytime sleepiness, restless sleep, snoring.  Patient has been under a lot of stress. Son is undergoing chemo for Sarcoma, long complicated process with surgery and treatments. Has last treatment hopefully this week if counts are okay. She has been trying to get RA under control as well this past year.    Has RA now on enbrel and Arava .Feels it is working some better .    Allergies  Allergen Reactions  . Cefdinir Other (See Comments)    Severe Stomach cramps and diarrhea for several days.  . Doxycycline Diarrhea  . Codeine Nausea And Vomiting  . Penicillins     RASH, ITCHING  . Simvastatin     myalgias    Immunization History  Administered Date(s) Administered  . Hepatitis A, Adult  09/04/2016, 03/27/2017  . Influenza Split 12/09/2011  . Influenza,inj,Quad PF,6+ Mos 11/29/2012, 11/01/2013, 11/20/2015, 12/29/2017, 04/03/2019, 12/13/2019  . PFIZER(Purple Top)SARS-COV-2 Vaccination 05/20/2019, 06/15/2019, 01/11/2020  . Pneumococcal Polysaccharide-23 08/01/2019  . Tdap 06/07/2015  . Zoster Recombinat (Shingrix) 08/01/2019    Past Medical History:  Diagnosis Date  . Allergic rhinitis 05/31/2012  . Chest pain    PT SAW CARDIOLOGIST DR. Burt Knack - STRESS ECHO DONE NEGATIVE - NO PROBLEM SINCE  . Chicken pox as achild  . Cough 11/29/2012   RESOLVED - IT WAS CAUSED BY LISINOPRIL  . Elevated LFTs 05/31/2012  . Eustachian tube dysfunction 07/27/2017  . GERD (gastroesophageal reflux disease)   . H/O tobacco use, presenting hazards to health 11/29/2012   Smoked 1 1/2 ppd for 20 years quit in roughtly 2007  . Headache 11/25/2015  . Hematuria 04/09/2014  . Hoarseness 02/04/2012   RESOLVED - THOUGHT TO HAVE BEEN ALLERY RELATED  . Hyperglycemia 04/06/2014  . Hyperlipidemia   . Hyperparathyroidism ([place])   . Hypertension   . Obesity, unspecified 05/31/2012  . Ocular migraine   . Overactive bladder 05/31/2012  . Preventative health care 02/04/2012  . PTSD (post-traumatic stress disorder)   . Rapid heart beat    PT FEELS PROB RELATED TO HYPER PARATHYROIDISM  . Rheumatoid arthritis ([place])   . SUI (stress urinary incontinence, female) 02/04/2012   Sees Dr Perlie Gold and they have discussed a bladder tack but so far she declines    Tobacco History: Social History   Tobacco  Use  Smoking Status Former Smoker  . Packs/day: 1.00  . Years: 20.00  . Pack years: 20.00  . Types: Cigarettes  . Quit date: 03/10/2005  . Years since quitting: 15.0  Smokeless Tobacco Never Used   Counseling given: Not Answered   Outpatient Medications Prior to Visit  Medication Sig Dispense Refill  . aspirin 325 MG tablet Take 325 mg by mouth daily.    Marland Kitchen atorvastatin (LIPITOR) 40 MG tablet Take 1 tablet  (40 mg total) by mouth daily. 90 tablet 1  . Cholecalciferol (VITAMIN D PO) Take 2,000 mg by mouth daily. Vitamin D    . Co-Enzyme Q-10 30 MG CAPS Take 100 mg by mouth at bedtime.    . DOCOSAHEXAENOIC ACID PO Take 1 tablet by mouth daily.    Marland Kitchen escitalopram (LEXAPRO) 10 MG tablet Take 1 tablet (10 mg total) by mouth daily. 90 tablet 1  . Etanercept (ENBREL MINI) 50 MG/ML SOCT Inject 50 mg into the skin every 7 (seven) days. 12 mL 0  . furosemide (LASIX) 20 MG tablet TAKE 1 TABLET BY MOUTH EVERY DAY 90 tablet 1  . irbesartan (AVAPRO) 150 MG tablet TAKE 1 TABLET BY MOUTH EVERY DAY 90 tablet 1  . lactobacillus acidophilus (BACID) TABS tablet Take 2 tablets by mouth 3 (three) times daily.    Marland Kitchen leflunomide (ARAVA) 20 MG tablet TAKE 1 TABLET BY MOUTH EVERY DAY 90 tablet 0  . levothyroxine (SYNTHROID) 75 MCG tablet Take 1 tablet (75 mcg total) by mouth daily before breakfast. 30 tablet 0  . metoprolol succinate (TOPROL-XL) 25 MG 24 hr tablet TAKE 1 TABLET BY MOUTH EVERY DAY 90 tablet 0  . omeprazole (PRILOSEC) 40 MG capsule TAKE 1 CAPSULE BY MOUTH EVERY DAY 90 capsule 3  . OVER THE COUNTER MEDICATION Take 1 capsule by mouth daily. Mega red- 1 capsule daily    . predniSONE (DELTASONE) 5 MG tablet Take 4 tablets by mouth daily x1wk, 3 tablets by mouth daily x1wk, 2 tablets by mouth daily x1wk, 1 tablet by mouth daily x1wk. 70 tablet 0  . verapamil (CALAN-SR) 120 MG CR tablet TAKE 1 TABLET BY MOUTH EVERYDAY AT BEDTIME 90 tablet 3  . ALPRAZolam (XANAX) 0.25 MG tablet Take 1 tablet (0.25 mg total) by mouth 2 (two) times daily as needed for anxiety. (Patient not taking: Reported on 02/22/2020) 20 tablet 0  . folic acid (FOLVITE) 1 MG tablet Take 2 tablets (2 mg total) by mouth daily. (Patient not taking: Reported on 02/22/2020) 180 tablet 3   No facility-administered medications prior to visit.     Review of Systems:   Constitutional:   No  weight loss, night sweats,  Fevers, chills, fatigue, or   lassitude.  HEENT:   No headaches,  Difficulty swallowing,  Tooth/dental problems, or  Sore throat,                No sneezing, itching, ear ache, nasal congestion, post nasal drip,   CV:  No chest pain,  Orthopnea, PND, swelling in lower extremities, anasarca, dizziness, palpitations, syncope.   GI  No heartburn, indigestion, abdominal pain, nausea, vomiting, diarrhea, change in bowel habits, loss of appetite, bloody stools.   Resp:  No excess mucus, no productive cough,  No non-productive cough,  No coughing up of blood.  No change in color of mucus.  No wheezing.  No chest wall deformity  Skin: no rash or lesions.  GU: no dysuria, change in color of urine, no urgency  or frequency.  No flank pain, no hematuria   MS:  No joint pain or swelling.  No decreased range of motion.  No back pain.    Physical Exam  BP 120/78 (BP Location: Left Arm, Patient Position: Sitting, Cuff Size: Normal)   Pulse 75   Temp (!) 97.5 F (36.4 C) (Temporal)   Ht 5\' 5"  (1.651 m)   Wt 207 lb (93.9 kg)   LMP 04/01/2011   SpO2 95% Comment: on RA  BMI 34.45 kg/m   GEN: A/Ox3; pleasant , NAD, well nourished    HEENT:  [place]/AT,    NECK:  Supple w/ fair ROM; no JVD; normal carotid impulses w/o bruits; no thyromegaly or nodules palpated; no lymphadenopathy.    RESP  Clear  P & A; w/o, wheezes/ rales/ or rhonchi. no accessory muscle use, no dullness to percussion  CARD:  RRR, no m/r/g, no peripheral edema, pulses intact, no cyanosis or clubbing.  GI:   Soft & nt; nml bowel sounds; no organomegaly or masses detected.   Musco: Warm bil, no deformities or joint swelling noted.   Neuro: alert, no focal deficits noted.    Skin: Warm, no lesions or rashes    Lab Results:  CBC    Component Value Date/Time   WBC 5.7 01/24/2020 1343   RBC 4.82 01/24/2020 1343   HGB 14.4 01/24/2020 1343   HCT 43.3 01/24/2020 1343   PLT 279 01/24/2020 1343   MCV 89.8 01/24/2020 1343   MCH 29.9 01/24/2020 1343    MCHC 33.3 01/24/2020 1343   RDW 13.1 01/24/2020 1343   LYMPHSABS 2,514 01/24/2020 1343   MONOABS 0.4 04/18/2015 0736   EOSABS 80 01/24/2020 1343   BASOSABS 40 01/24/2020 1343    BMET    Component Value Date/Time   NA 139 01/24/2020 1343   K 5.2 01/24/2020 1343   CL 101 01/24/2020 1343   CO2 28 01/24/2020 1343   GLUCOSE 89 01/24/2020 1343   BUN 14 01/24/2020 1343   CREATININE 0.74 01/24/2020 1343   CALCIUM 10.4 01/24/2020 1343   GFRNONAA 89 01/24/2020 1343   GFRAA 103 01/24/2020 1343    BNP    Component Value Date/Time   BNP 20.4 12/28/2012 1652    ProBNP No results found for: PROBNP  Imaging: No results found.    No flowsheet data found.  No results found for: NITRICOXIDE      Assessment & Plan:   OSA (obstructive sleep apnea) Moderate OSA -needs to start CPAP  DME contacted on status of CPAP , awaiting reply back   Plan  Patient Instructions  Begin CPAP At bedtime   Wear all night long .  Work on healthy weight.  Touch base with our office if no reply from home care weekly .  Do not drive if sleepy  Follow up with Dr. Elsworth Soho  Or Kamin Niblack NP and As needed   Please contact office for sooner follow up if symptoms do not improve or worsen or seek emergency care       Obesity (BMI 30-39.9) Healthy weight loss discussed      Rexene Edison, NP 04/09/2020

## 2020-04-12 ENCOUNTER — Other Ambulatory Visit: Payer: Self-pay

## 2020-04-12 DIAGNOSIS — M0609 Rheumatoid arthritis without rheumatoid factor, multiple sites: Secondary | ICD-10-CM

## 2020-04-12 DIAGNOSIS — Z79899 Other long term (current) drug therapy: Secondary | ICD-10-CM

## 2020-04-12 LAB — HM MAMMOGRAPHY

## 2020-04-12 LAB — HM PAP SMEAR

## 2020-04-13 ENCOUNTER — Telehealth: Payer: Self-pay | Admitting: *Deleted

## 2020-04-13 LAB — COMPLETE METABOLIC PANEL WITH GFR
AG Ratio: 1.6 (calc) (ref 1.0–2.5)
ALT: 31 U/L — ABNORMAL HIGH (ref 6–29)
AST: 26 U/L (ref 10–35)
Albumin: 4.2 g/dL (ref 3.6–5.1)
Alkaline phosphatase (APISO): 91 U/L (ref 37–153)
BUN: 15 mg/dL (ref 7–25)
CO2: 28 mmol/L (ref 20–32)
Calcium: 9.7 mg/dL (ref 8.6–10.4)
Chloride: 104 mmol/L (ref 98–110)
Creat: 0.59 mg/dL (ref 0.50–1.05)
GFR, Est African American: 117 mL/min/{1.73_m2} (ref >=60)
GFR, Est Non African American: 101 mL/min/{1.73_m2} (ref >=60)
Globulin: 2.6 g/dL (calc) (ref 1.9–3.7)
Glucose, Bld: 105 mg/dL — ABNORMAL HIGH (ref 65–99)
Potassium: 4.6 mmol/L (ref 3.5–5.3)
Sodium: 141 mmol/L (ref 135–146)
Total Bilirubin: 0.4 mg/dL (ref 0.2–1.2)
Total Protein: 6.8 g/dL (ref 6.1–8.1)

## 2020-04-13 LAB — CBC WITH DIFFERENTIAL/PLATELET
Absolute Monocytes: 666 cells/uL (ref 200–950)
Basophils Absolute: 32 cells/uL (ref 0–200)
Basophils Relative: 0.7 %
Eosinophils Absolute: 50 cells/uL (ref 15–500)
Eosinophils Relative: 1.1 %
HCT: 39.7 % (ref 35.0–45.0)
Hemoglobin: 13 g/dL (ref 11.7–15.5)
Lymphs Abs: 1760 cells/uL (ref 850–3900)
MCH: 29.3 pg (ref 27.0–33.0)
MCHC: 32.7 g/dL (ref 32.0–36.0)
MCV: 89.4 fL (ref 80.0–100.0)
MPV: 11.2 fL (ref 7.5–12.5)
Monocytes Relative: 14.8 %
Neutro Abs: 1994 cells/uL (ref 1500–7800)
Neutrophils Relative %: 44.3 %
Platelets: 233 10*3/uL (ref 140–400)
RBC: 4.44 10*6/uL (ref 3.80–5.10)
RDW: 12.8 % (ref 11.0–15.0)
Total Lymphocyte: 39.1 %
WBC: 4.5 10*3/uL (ref 3.8–10.8)

## 2020-04-13 NOTE — Telephone Encounter (Signed)
error 

## 2020-04-13 NOTE — Progress Notes (Signed)
CBC WNL.  ALT borderline elevated but stable. Please advise the patient to avoid tylenol, NSAIDs, and alcohol use.  Rest of CMP WNL.  We will continue to monitor.

## 2020-04-16 ENCOUNTER — Ambulatory Visit: Payer: Self-pay | Admitting: Rheumatology

## 2020-04-16 DIAGNOSIS — M19072 Primary osteoarthritis, left ankle and foot: Secondary | ICD-10-CM

## 2020-04-16 DIAGNOSIS — E063 Autoimmune thyroiditis: Secondary | ICD-10-CM

## 2020-04-16 DIAGNOSIS — M0609 Rheumatoid arthritis without rheumatoid factor, multiple sites: Secondary | ICD-10-CM

## 2020-04-16 DIAGNOSIS — I10 Essential (primary) hypertension: Secondary | ICD-10-CM

## 2020-04-16 DIAGNOSIS — Z96611 Presence of right artificial shoulder joint: Secondary | ICD-10-CM

## 2020-04-16 DIAGNOSIS — M19071 Primary osteoarthritis, right ankle and foot: Secondary | ICD-10-CM

## 2020-04-16 DIAGNOSIS — Z79899 Other long term (current) drug therapy: Secondary | ICD-10-CM

## 2020-04-16 DIAGNOSIS — Z87891 Personal history of nicotine dependence: Secondary | ICD-10-CM

## 2020-04-16 DIAGNOSIS — E210 Primary hyperparathyroidism: Secondary | ICD-10-CM

## 2020-04-16 DIAGNOSIS — Z8719 Personal history of other diseases of the digestive system: Secondary | ICD-10-CM

## 2020-04-16 DIAGNOSIS — Z8759 Personal history of other complications of pregnancy, childbirth and the puerperium: Secondary | ICD-10-CM

## 2020-04-16 DIAGNOSIS — Z8639 Personal history of other endocrine, nutritional and metabolic disease: Secondary | ICD-10-CM

## 2020-04-16 DIAGNOSIS — F419 Anxiety disorder, unspecified: Secondary | ICD-10-CM

## 2020-04-16 DIAGNOSIS — M19041 Primary osteoarthritis, right hand: Secondary | ICD-10-CM

## 2020-04-16 DIAGNOSIS — F32A Depression, unspecified: Secondary | ICD-10-CM

## 2020-04-16 DIAGNOSIS — Z8669 Personal history of other diseases of the nervous system and sense organs: Secondary | ICD-10-CM

## 2020-04-16 NOTE — Progress Notes (Unsigned)
Office Visit Note  Patient: Sandra [name]             Date of Birth: Feb 16, 1961           MRN: [id number]             PCP: [name], MD Referring: [name], MD Visit Date: 04/19/2020 Occupation: @GUAROCC @  Subjective:  Pain in multiple joints   History of Present Illness: Sandra [name] is a 60 y.o. female with history of seronegative rheumatoid arthritis and osteoarthritis.  She is on enbrel 50 mg sq injections every week and arava 20 mg 1 tablet by mouth daily.  She was started on Enbrel on 03/15/2020.  She is due for her sixth injection tonight.  She reports that she noticed significant improvement in her joint pain and joint swelling the first 3 weeks while on Enbrel.  She states that she was essentially pain-free at that time.  She states over the past 2 weeks she has been experiencing pain in multiple joints including both shoulders, both hands, both knees, both ankle joints.  She has noticed intermittent swelling in her hands and ankles.  She has been experiencing increased nocturnal pain which has been worsening her fatigue during the day.  She has been using Voltaren gel topically as needed for pain relief.  She is feeling very discouraged and does not feel as though she will be able to return to work at this time due to the severity of her pain and fatigue.      Activities of Daily Living:  Patient reports morning stiffness for 2-3 hours.   Patient Reports nocturnal pain.  Difficulty dressing/grooming: Denies Difficulty climbing stairs: Denies Difficulty getting out of chair: Denies Difficulty using hands for taps, buttons, cutlery, and/or writing: Reports  Review of Systems  Constitutional: Positive for fatigue.  HENT: Positive for mouth dryness. Negative for mouth sores and nose dryness.   Eyes: Negative for pain, itching and dryness.  Respiratory: Negative for shortness of breath and difficulty breathing.   Cardiovascular: Negative for chest pain and  palpitations.  Gastrointestinal: Negative for blood in stool, constipation and diarrhea.  Endocrine: Negative for increased urination.  Genitourinary: Negative for difficulty urinating.  Musculoskeletal: Positive for arthralgias, joint pain, joint swelling and morning stiffness. Negative for myalgias, muscle tenderness and myalgias.  Skin: Negative for color change, rash and redness.  Allergic/Immunologic: Negative for susceptible to infections.  Neurological: Negative for dizziness, numbness, headaches, memory loss and weakness.  Hematological: Positive for bruising/bleeding tendency.  Psychiatric/Behavioral: Positive for sleep disturbance. Negative for confusion.    PMFS History:  Patient Active Problem List   Diagnosis Date Noted  . Back pain 12/13/2019  . Rheumatoid arthritis ([place]) 12/02/2019  . Anxiety and depression 08/01/2019  . Arthralgia 05/04/2019  . OSA (obstructive sleep apnea) 05/03/2019  . Thyroiditis 12/20/2018  . Muscle cramp 12/20/2018  . Eustachian tube dysfunction 07/27/2017  . Headache 11/25/2015  . Acute bacterial sinusitis 03/26/2015  . Hematuria 04/09/2014  . Hyperglycemia 04/06/2014  . Diarrhea 09/21/2013  . Hyperparathyroidism, primary ([place]) 07/12/2013  . Dyspnea 12/28/2012  . Hypercalcemia 12/16/2012  . H/O tobacco use, presenting hazards to health 11/29/2012  . Overactive bladder 05/31/2012  . Elevated LFTs 05/31/2012  . Obesity (BMI 30-39.9) 05/31/2012  . Allergic rhinitis 05/31/2012  . Hoarseness 02/04/2012  . Preventative health care 02/04/2012  . SUI (stress urinary incontinence, female) 02/04/2012  . Migraine   . GERD (gastroesophageal reflux disease)   . Rotator  cuff syndrome of right shoulder 06/11/2011  . Atypical chest pain 01/03/2011  . Hypertension 01/03/2011  . Hyperlipidemia 01/03/2011  . KNEE PAIN, BILATERAL 09/28/2008  . ANSERINE BURSITIS 09/28/2008  . CAVUS DEFORMITY OF FOOT, ACQUIRED 09/28/2008    Past Medical History:   Diagnosis Date  . Allergic rhinitis 05/31/2012  . Chest pain    PT SAW CARDIOLOGIST DR. Burt Knack - STRESS ECHO DONE NEGATIVE - NO PROBLEM SINCE  . Chicken pox as achild  . Cough 11/29/2012   RESOLVED - IT WAS CAUSED BY LISINOPRIL  . Elevated LFTs 05/31/2012  . Eustachian tube dysfunction 07/27/2017  . GERD (gastroesophageal reflux disease)   . H/O tobacco use, presenting hazards to health 11/29/2012   Smoked 1 1/2 ppd for 20 years quit in roughtly 2007  . Headache 11/25/2015  . Hematuria 04/09/2014  . Hoarseness 02/04/2012   RESOLVED - THOUGHT TO HAVE BEEN ALLERY RELATED  . Hyperglycemia 04/06/2014  . Hyperlipidemia   . Hyperparathyroidism ([place])   . Hypertension   . Obesity, unspecified 05/31/2012  . Ocular migraine   . Overactive bladder 05/31/2012  . Preventative health care 02/04/2012  . PTSD (post-traumatic stress disorder)   . Rapid heart beat    PT FEELS PROB RELATED TO HYPER PARATHYROIDISM  . Rheumatoid arthritis ([place])   . SUI (stress urinary incontinence, female) 02/04/2012   Sees Dr Perlie Gold and they have discussed a bladder tack but so far she declines    Family History  Problem Relation Age of Onset  . Hypertension Mother 54       alive  . Renal cancer Mother        renal  . Hyperlipidemia Mother   . Osteoporosis Mother        humerus   . Macular degeneration Mother   . Tremor Mother   . Parkinson's disease Mother   . Hypertension Father 86       alive  . Atrial fibrillation Father   . Colon cancer Father        colon  . Hyperlipidemia Father   . Stroke Father   . Alzheimer's disease Father   . Hypertension Brother 5       alive  . Tremor Brother   . Hypertension Sister   . Lung cancer Maternal Grandfather        lung/ smoker  . Emphysema Maternal Grandfather   . Heart disease Paternal Grandmother   . Other Paternal Grandfather        black lung  . Heart disease Paternal Grandfather   . Asthma Son   . Eczema Son   . Healthy Son   . Healthy Son   .  Heart disease Paternal Aunt   . Heart disease Paternal Uncle    Past Surgical History:  Procedure Laterality Date  . BREAST EXCISIONAL BIOPSY Left    Age 66 Fibroadenoma  . BREAST SURGERY     left breast excision of adenoma  . btl    . PARATHYROIDECTOMY Right 08/12/2013   Procedure: right inferior frozen section PARATHYROIDECTOMY;  Surgeon: Earnstine Regal, MD;  Location: [facility];  Service: General;  Laterality: Right;  . PELVIC LAPAROSCOPY  03-2004  . TOTAL SHOULDER REPLACEMENT Right 01/2019  . TUBAL LIGATION    . WISDOM TOOTH EXTRACTION  30 yrs ago   Social History   Social History Narrative  . Not on file   Immunization History  Administered Date(s) Administered  . Hepatitis A, Adult 09/04/2016, 03/27/2017  . Influenza  Split 12/09/2011  . Influenza,inj,Quad PF,6+ Mos 11/29/2012, 11/01/2013, 11/20/2015, 12/29/2017, 04/03/2019, 12/13/2019  . PFIZER(Purple Top)SARS-COV-2 Vaccination 05/20/2019, 06/15/2019, 01/11/2020  . Pneumococcal Polysaccharide-23 08/01/2019  . Tdap 06/07/2015  . Zoster Recombinat (Shingrix) 08/01/2019     Objective: Vital Signs: BP (!) 156/111 (BP Location: Left Arm, Patient Position: Sitting, Cuff Size: Normal)   Pulse 84   Resp 15   Ht 5\' 5"  (1.651 m)   Wt 207 lb (93.9 kg)   LMP 04/01/2011   BMI 34.45 kg/m    Physical Exam Vitals and nursing note reviewed.  Constitutional:      Appearance: She is well-developed and well-nourished.  HENT:     Head: Normocephalic and atraumatic.  Eyes:     Extraocular Movements: EOM normal.     Conjunctiva/sclera: Conjunctivae normal.  Cardiovascular:     Pulses: Intact distal pulses.  Pulmonary:     Effort: Pulmonary effort is normal.  Abdominal:     Palpations: Abdomen is soft.  Musculoskeletal:     Cervical back: Normal range of motion.  Skin:    General: Skin is warm and dry.     Capillary Refill: Capillary refill takes less than 2 seconds.  Neurological:     Mental Status: She is alert and oriented  to person, place, and time.  Psychiatric:        Mood and Affect: Mood and affect normal.        Behavior: Behavior normal.      Musculoskeletal Exam: C-spine, thoracic spine, lumbar spine good range of motion with no discomfort.  No midline spinal tenderness.  No SI joint tenderness.  Painful range of motion of both shoulder joints.  She has tenderness palpation over both shoulders.  Elbow joints have good range of motion with tenderness over the joint line of the left elbow.  Tenderness over the radial aspect of the left wrist.  Tenderness and synovitis of the right 2nd, 3rd, and 4th MCPs and 3rd PIP joints.  Tenderness and synoviits of the left 3rd MCP and PIP joint.  Hip joints good ROM with no discomfort.  Tenderness over bilateral trochanteric bursa.  Painful ROM of both knee joints with no warmth or effusion.  Tenderness over both ankle joints.  Warmth of the right ankle noted.  Tenderness over the left 1st MTP joint.  CDAI Exam: CDAI Score: 16.3  Patient Global: 8 mm; Provider Global: 5 mm Swollen: 7 ; Tender: 12  Joint Exam 04/19/2020      Right  Left  Glenohumeral      Tender  Elbow      Tender  Wrist      Tender  MCP 2  Swollen Tender     MCP 3  Swollen Tender  Swollen Tender  MCP 4  Swollen Tender     PIP 3  Swollen Tender  Swollen Tender  Ankle  Swollen Tender   Tender  MTP 1      Tender     Investigation: No additional findings.  Imaging: No results found.  Recent Labs: Lab Results  Component Value Date   WBC 4.5 04/12/2020   HGB 13.0 04/12/2020   PLT 233 04/12/2020   NA 141 04/12/2020   K 4.6 04/12/2020   CL 104 04/12/2020   CO2 28 04/12/2020   GLUCOSE 105 (H) 04/12/2020   BUN 15 04/12/2020   CREATININE 0.59 04/12/2020   BILITOT 0.4 04/12/2020   ALKPHOS 110 05/03/2019   AST 26 04/12/2020   ALT 31 (H)  04/12/2020   PROT 6.8 04/12/2020   ALBUMIN 4.6 05/03/2019   CALCIUM 9.7 04/12/2020   GFRAA 117 04/12/2020   QFTBGOLDPLUS NEGATIVE 07/26/2019     Speciality Comments: No specialty comments available.  Procedures:  No procedures performed Allergies: Cefdinir, Doxycycline, Codeine, Penicillins, and Simvastatin   Assessment / Plan:     Visit Diagnoses: Rheumatoid arthritis of multiple sites with negative rheumatoid factor [facility]): She presents today with pain in multiple joints including both shoulders, both wrist joints, both hands, both knee joints, and both ankles.  She has tenderness and synovitis of multiple MCP and PIP joints as described above.  She also has tenderness and inflammation in the right ankle joint.  Painful range of motion of both knee joints noted but no warmth or effusion was apparent.  She is currently on Enbrel 50 mg subcutaneous injections once weekly and Arava 20 mg 1 tablet by mouth daily.  She was started on Enbrel on 03/15/2020 and is due for her sixth injection this evening.  According to the patient she noticed significant improvement the first 3 weeks while on Enbrel.  She was not taking prednisone at that time.  Over the past 2 weeks she has been experiencing significant discomfort and inflammation in multiple joints.  She has been using Voltaren gel topically as needed for pain relief.  Her morning stiffness has been severe in the morning and her joint pain is typically worse in the evenings.  She is also been experiencing profound fatigue in the afternoons and has had worsening nocturnal pain.  We will be extending continuous FMLA until 05/21/2020.  She will be starting on a prednisone taper starting at 20 mg tapering by 5 mg every week. She was also advised to start taking natural antiinflammatories including ginger, tart cherry, and omega 3 (can continue tumeric).   We discussed giving Enbrel more time prior to making any other medication changes.  She will continue taking Arava as prescribed.  She previously had an inadequate response to Humira and noticed increased joint pain while on methotrexate.  If she continues  to have recurrent flares we will have to discuss other treatment options at her upcoming follow-up visit in 1 month.  High risk medication use - Enbrel 50 mg subcutaneous injections once weekly and Arava 20 mg 1 tablet by mouth daily. (Discontinued Humira due to inadequate response (11/23/19-02/15/20)  Discontinued MTX due to increased joint pain.  CBC and CMP updated on 04/12/20.  She will be due to update lab work in May and every 3 months.  Standing orders for CBC and CMP are in place.  TB gold negative on 07/26/19.  Future order for TB gold placed today.  - Plan: QuantiFERON-TB Gold Plus She is aware that she has to hold Enbrel and Arava if she develops signs or symptoms of an infection and to resume once infection has completely cleared. She has received 3 Pfizer COVID-19 vaccinations.  Screening for tuberculosis -Future order for TB gold placed today.  Plan: QuantiFERON-TB Gold Plus  Osteopenia of multiple sites - DEXA updated on 08/12/19: The BMD measured at Forearm Radius 33% is 0.751 g/cm2 with a T-score of -1.4.  She takes vitamin D 2,000 units by mouth daily.  Status post total shoulder replacement, right: She has good ROM with some discomfort and tenderness to palpation.   Primary osteoarthritis of both hands: PIP and DIP thickening consistent with osteoarthritis of both hands noted.  She has tenderness and synovitis of bilateral third PIP joints.  Discussed the importance of joint protection and muscle strengthening.  She will be starting on a prednisone taper starting 20 mg tapering by 5 mg every week.  Primary osteoarthritis of both feet: She has painful range of motion of both ankle joints.  Tenderness and warmth of the right ankle was noted.  She has tenderness of the left first MTP on examination today.  Other medical conditions are listed as follows:  Essential hypertension: She was advised to monitor blood pressure closely.  Hyperparathyroidism, primary ([place])  Hashimoto's  thyroiditis  History of gastroesophageal reflux (GERD)  History of hyperlipidemia  Hx of migraines  Anxiety and depression  History of miscarriage  Former smoker  Orders: Orders Placed This Encounter  Procedures  . QuantiFERON-TB Gold Plus   Meds ordered this encounter  Medications  . predniSONE (DELTASONE) 5 MG tablet    Sig: Take 4 tablets by mouth daily x1wk, 3 tablets by mouth daily x1wk, 2 tablets by mouth daily x1wk, 1 tablet by mouth daily x1wk.    Dispense:  70 tablet    Refill:  0     Follow-Up Instructions: Return in about 1 month (around 05/17/2020) for Rheumatoid arthritis, Osteoarthritis.   Ofilia Neas, PA-C  Note - This record has been created using Dragon software.  Chart creation errors have been sought, but may not always  have been located. Such creation errors do not reflect on  the standard of medical care.

## 2020-04-19 ENCOUNTER — Other Ambulatory Visit: Payer: Self-pay

## 2020-04-19 ENCOUNTER — Ambulatory Visit (INDEPENDENT_AMBULATORY_CARE_PROVIDER_SITE_OTHER): Payer: No Typology Code available for payment source | Admitting: Physician Assistant

## 2020-04-19 ENCOUNTER — Encounter: Payer: Self-pay | Admitting: Physician Assistant

## 2020-04-19 ENCOUNTER — Telehealth: Payer: Self-pay | Admitting: Adult Health

## 2020-04-19 VITALS — BP 156/111 | HR 84 | Resp 15 | Ht 65.0 in | Wt 207.0 lb

## 2020-04-19 DIAGNOSIS — M19042 Primary osteoarthritis, left hand: Secondary | ICD-10-CM

## 2020-04-19 DIAGNOSIS — Z111 Encounter for screening for respiratory tuberculosis: Secondary | ICD-10-CM | POA: Diagnosis not present

## 2020-04-19 DIAGNOSIS — Z96611 Presence of right artificial shoulder joint: Secondary | ICD-10-CM

## 2020-04-19 DIAGNOSIS — Z8759 Personal history of other complications of pregnancy, childbirth and the puerperium: Secondary | ICD-10-CM

## 2020-04-19 DIAGNOSIS — Z79899 Other long term (current) drug therapy: Secondary | ICD-10-CM | POA: Diagnosis not present

## 2020-04-19 DIAGNOSIS — Z8669 Personal history of other diseases of the nervous system and sense organs: Secondary | ICD-10-CM

## 2020-04-19 DIAGNOSIS — E063 Autoimmune thyroiditis: Secondary | ICD-10-CM

## 2020-04-19 DIAGNOSIS — M0609 Rheumatoid arthritis without rheumatoid factor, multiple sites: Secondary | ICD-10-CM | POA: Diagnosis not present

## 2020-04-19 DIAGNOSIS — M19071 Primary osteoarthritis, right ankle and foot: Secondary | ICD-10-CM

## 2020-04-19 DIAGNOSIS — E210 Primary hyperparathyroidism: Secondary | ICD-10-CM

## 2020-04-19 DIAGNOSIS — I10 Essential (primary) hypertension: Secondary | ICD-10-CM

## 2020-04-19 DIAGNOSIS — F32A Depression, unspecified: Secondary | ICD-10-CM

## 2020-04-19 DIAGNOSIS — M8589 Other specified disorders of bone density and structure, multiple sites: Secondary | ICD-10-CM

## 2020-04-19 DIAGNOSIS — M19041 Primary osteoarthritis, right hand: Secondary | ICD-10-CM

## 2020-04-19 DIAGNOSIS — Z8639 Personal history of other endocrine, nutritional and metabolic disease: Secondary | ICD-10-CM

## 2020-04-19 DIAGNOSIS — Z87891 Personal history of nicotine dependence: Secondary | ICD-10-CM

## 2020-04-19 DIAGNOSIS — F419 Anxiety disorder, unspecified: Secondary | ICD-10-CM

## 2020-04-19 DIAGNOSIS — M19072 Primary osteoarthritis, left ankle and foot: Secondary | ICD-10-CM

## 2020-04-19 DIAGNOSIS — Z8719 Personal history of other diseases of the digestive system: Secondary | ICD-10-CM

## 2020-04-19 MED ORDER — PREDNISONE 5 MG PO TABS: ORAL_TABLET | ORAL | 0 refills | Status: DC

## 2020-04-19 NOTE — Telephone Encounter (Signed)
Please reach out to DME company as patient was ordered CPAP in September when it was recommended by Dr. Elsworth Soho can we just get some kind of timeframe of when she is going to get her CPAP that is way too long

## 2020-04-19 NOTE — Patient Instructions (Signed)
Standing Labs We placed an order today for your standing lab work.   Please have your standing labs drawn in May and every 3 months   If possible, please have your labs drawn 2 weeks prior to your appointment so that the provider can discuss your results at your appointment.  We have open lab daily Monday through Thursday from 1:30-4:30 PM and Friday from 1:30-4:00 PM at the office of Dr. Shaili Deveshwar, [facility].   Please be advised, all patients with office appointments requiring lab work will take precedents over walk-in lab work.  If possible, please come for your lab work on Monday and Friday afternoons, as you may experience shorter wait times. The office is located at [address], [address], [place], [place] [address] No appointment is necessary.   Labs are drawn by [facility]. Please bring your co-pay at the time of your lab draw.  You may receive a bill from [facility] for your lab work.  If you wish to have your labs drawn at another location, please call the office 24 hours in advance to send orders.  If you have any questions regarding directions or hours of operation,  please call [phone].   As a reminder, please drink plenty of water prior to coming for your lab work. Thanks!   

## 2020-04-19 NOTE — Telephone Encounter (Signed)
It is a 2-8 month delay for CPAPs The only option for sooner delivery is to switch to the LUNA machine

## 2020-04-19 NOTE — Telephone Encounter (Signed)
Call returned to patient, confirmed DOB She just wanted to let TP know her cpap is delayed until further notice. DME company to contact her when available. She reports nothing further is needed.  Will route message to provider.

## 2020-04-24 ENCOUNTER — Telehealth: Payer: Self-pay | Admitting: *Deleted

## 2020-04-24 NOTE — Telephone Encounter (Signed)
Patient advised FMLA paperwork has been completed and faxed. Patient advised her copy is at the front to pick up. Patient expressed understanding.

## 2020-05-08 NOTE — Progress Notes (Signed)
Office Visit Note  Patient: Sandra [name]             Date of Birth: [birth year]           MRN: [id number]             PCP: [name], MD Referring: [name], MD Visit Date: 05/17/2020 Occupation: @GUAROCC @  Subjective:  Pain in both hands   History of Present Illness: Sandra [name] is a 60 y.o. female with history of seronegative rheumatoid arthritis and osteoarthritis.  Patient is on Enbrel 50 mg subcutaneous injections once weekly and Arava 20 mg 1 tablet by mouth daily.  She has not missed any doses of these medications recently.  She is due for her next Enbrel dose today.  She reports that she has been getting an injection site reaction on a weekly basis.  She has started to take Zyrtec on a daily basis and has been using cortisone cream topically for symptomatic relief.  She completed a prednisone taper 2 days ago which improved her joint pain and inflammation significantly.  According to the patient she started taking natural anti-inflammatories after her last office visit which she feels have helped with her joint pain and stiffness.  She continues to have pain in both hands and both feet.  She has intermittent swelling in both hands.  Overall she feels as though the current combination is starting to improve her symptoms.  She recently got a new shipment of Enbrel which she would like to complete and reevaluate how she is feeling off of prednisone in about 1 month. She has been under tremendous amount of stress.  Her youngest son is currently hospitalized for the treatment of leukemia.  She has been having to sleep at the hospital nightly basis which has made it difficult to have restorative sleep.  She is having some increased discomfort in both hips which she attributes to sleeping in a recliner.  She supposed to return to work on Monday but does not feel as though she will be able to due to the stress she is under.  She will also be very difficult for her to be  commuting from [facility] on a daily basis for work due to the ongoing pain and stiffness she is experiencing in multiple joints. She denies any recent infections.      Activities of Daily Living:  Patient reports morning stiffness for 1 hour.   Patient Reports nocturnal pain.  Difficulty dressing/grooming: Denies Difficulty climbing stairs: Denies Difficulty getting out of chair: Denies Difficulty using hands for taps, buttons, cutlery, and/or writing: Reports  Review of Systems  Constitutional: Positive for fatigue.  HENT: Positive for mouth dryness. Negative for mouth sores and nose dryness.   Eyes: Positive for visual disturbance. Negative for pain, itching and dryness.  Respiratory: Negative for cough, hemoptysis, shortness of breath and difficulty breathing.   Cardiovascular: Positive for palpitations. Negative for chest pain and swelling in legs/feet.  Gastrointestinal: Positive for diarrhea. Negative for abdominal pain, blood in stool and constipation.  Endocrine: Negative for increased urination.  Genitourinary: Negative for painful urination.  Musculoskeletal: Positive for arthralgias, joint pain, joint swelling and morning stiffness. Negative for myalgias, muscle weakness, muscle tenderness and myalgias.  Skin: Positive for nodules/bumps. Negative for color change, rash and redness.  Allergic/Immunologic: Negative for susceptible to infections.  Neurological: Positive for weakness. Negative for dizziness, numbness, headaches and memory loss.  Hematological: Negative for swollen glands.  Psychiatric/Behavioral: Positive for  sleep disturbance. Negative for confusion.    PMFS History:  Patient Active Problem List   Diagnosis Date Noted  . Back pain 12/13/2019  . Rheumatoid arthritis ([place]) 12/02/2019  . Anxiety and depression 08/01/2019  . Arthralgia 05/04/2019  . OSA (obstructive sleep apnea) 05/03/2019  . Thyroiditis 12/20/2018  . Muscle cramp 12/20/2018  .  Eustachian tube dysfunction 07/27/2017  . Headache 11/25/2015  . Acute bacterial sinusitis 03/26/2015  . Hematuria 04/09/2014  . Hyperglycemia 04/06/2014  . Diarrhea 09/21/2013  . Hyperparathyroidism, primary ([place]) 07/12/2013  . Dyspnea 12/28/2012  . Hypercalcemia 12/16/2012  . H/O tobacco use, presenting hazards to health 11/29/2012  . Overactive bladder 05/31/2012  . Elevated LFTs 05/31/2012  . Obesity (BMI 30-39.9) 05/31/2012  . Allergic rhinitis 05/31/2012  . Hoarseness 02/04/2012  . Preventative health care 02/04/2012  . SUI (stress urinary incontinence, female) 02/04/2012  . Migraine   . GERD (gastroesophageal reflux disease)   . Rotator cuff syndrome of right shoulder 06/11/2011  . Atypical chest pain 01/03/2011  . Hypertension 01/03/2011  . Hyperlipidemia 01/03/2011  . KNEE PAIN, BILATERAL 09/28/2008  . ANSERINE BURSITIS 09/28/2008  . 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 Allergic rhinitis 05/31/2012  . Chest pain    PT SAW CARDIOLOGIST DR. Burt Knack - STRESS ECHO DONE NEGATIVE - NO PROBLEM SINCE  . Chicken pox as achild  . Cough 11/29/2012   RESOLVED - IT WAS CAUSED BY LISINOPRIL  . Elevated LFTs 05/31/2012  . Eustachian tube dysfunction 07/27/2017  . GERD (gastroesophageal reflux disease)   . H/O tobacco use, presenting hazards to health 11/29/2012   Smoked 1 1/2 ppd for 20 years quit in roughtly 2007  . Headache 11/25/2015  . Hematuria 04/09/2014  . Hoarseness 02/04/2012   RESOLVED - THOUGHT TO HAVE BEEN ALLERY RELATED  . Hyperglycemia 04/06/2014  . Hyperlipidemia   . Hyperparathyroidism ([place])   . Hypertension   . Obesity, unspecified 05/31/2012  . Ocular migraine   . Overactive bladder 05/31/2012  . Preventative health care 02/04/2012  . PTSD (post-traumatic stress disorder)   . Rapid heart beat    PT FEELS PROB RELATED TO HYPER PARATHYROIDISM  . Rheumatoid arthritis ([place])   . SUI (stress urinary incontinence,  female) 02/04/2012   Sees Dr Perlie Gold and they have discussed a bladder tack but so far she declines    Family History  Problem Relation Age of Onset  . Hypertension Mother 38       alive  . Renal cancer Mother        renal  . Hyperlipidemia Mother   . Osteoporosis Mother        humerus   . Macular degeneration Mother   . Tremor Mother   . Parkinson's disease Mother   . Hypertension Father 39       alive  . Atrial fibrillation Father   . Colon cancer Father        colon  . Hyperlipidemia Father   . Stroke Father   . Alzheimer's disease Father   . Hypertension Brother 25       alive  . Tremor Brother   . Hypertension Sister   . Lung cancer Maternal Grandfather        lung/ smoker  . Emphysema Maternal Grandfather   . Heart disease Paternal Grandmother   . Other Paternal Grandfather        black lung  . Heart disease Paternal Grandfather   .  Asthma Son   . Eczema Son   . Healthy Son   . Healthy Son   . Heart disease Paternal Aunt   . Heart disease Paternal Uncle    Past Surgical History:  Procedure Laterality Date  . BREAST EXCISIONAL BIOPSY Left    Age 38 Fibroadenoma  . BREAST SURGERY     left breast excision of adenoma  . btl    . PARATHYROIDECTOMY Right 08/12/2013   Procedure: right inferior frozen section PARATHYROIDECTOMY;  Surgeon: Earnstine Regal, MD;  Location: [facility];  Service: General;  Laterality: Right;  . PELVIC LAPAROSCOPY  03-2004  . TOTAL SHOULDER REPLACEMENT Right 01/2019  . TUBAL LIGATION    . WISDOM TOOTH EXTRACTION  30 yrs ago   Social History   Social History Narrative  . Not on file   Immunization History  Administered Date(s) Administered  . Hepatitis A, Adult 09/04/2016, 03/27/2017  . Influenza Split 12/09/2011  . Influenza,inj,Quad PF,6+ Mos 11/29/2012, 11/01/2013, 11/20/2015, 12/29/2017, 04/03/2019, 12/13/2019  . PFIZER(Purple Top)SARS-COV-2 Vaccination 05/20/2019, 06/15/2019, 01/11/2020  . Pneumococcal Polysaccharide-23  08/01/2019  . Tdap 06/07/2015  . Zoster Recombinat (Shingrix) 08/01/2019     Objective: Vital Signs: BP (!) 139/94 (BP Location: Left Arm, Patient Position: Sitting, Cuff Size: Normal)   Pulse (!) 103   Ht 5\' 5"  (1.651 m)   Wt 202 lb 9.6 oz (91.9 kg)   LMP 04/01/2011   BMI 33.71 kg/m    Physical Exam Vitals and nursing note reviewed.  Constitutional:      Appearance: She is well-developed and well-nourished.  HENT:     Head: Normocephalic and atraumatic.  Eyes:     Extraocular Movements: EOM normal.     Conjunctiva/sclera: Conjunctivae normal.  Cardiovascular:     Pulses: Intact distal pulses.  Pulmonary:     Effort: Pulmonary effort is normal.  Abdominal:     Palpations: Abdomen is soft.  Musculoskeletal:     Cervical back: Normal range of motion.  Lymphadenopathy:     Cervical: No cervical adenopathy.  Skin:    General: Skin is warm and dry.     Capillary Refill: Capillary refill takes less than 2 seconds.  Neurological:     Mental Status: She is alert and oriented to person, place, and time.  Psychiatric:        Mood and Affect: Mood and affect normal.        Behavior: Behavior normal.      Musculoskeletal Exam: C-spine, thoracic spine, lumbar spinal good range of motion with no discomfort.  Right shoulder replacement has good range of motion with no discomfort.  Left shoulder has good range of motion with no discomfort.  Elbow joints, wrist joints, MCPs, PIPs, DIPs good range of motion with no synovitis.  She has tenderness palpation over bilateral second and third MCP joints.  Tenderness over the left first MCP and left CMC joint.  Hip joints have good range of motion with no discomfort.  She has tenderness of patient over bilateral trochanteric bursa.  Knee joints have good range of motion with no warmth or effusion.  Ankle joints have good range of motion with tenderness bilaterally.  Mild dorsal spur is noted.  No tenderness over MTP joints.  She has PIP and DIP  thickening consistent with osteoarthritis of both feet.  CDAI Exam: CDAI Score: 5.8  Patient Global: 4 mm; Provider Global: 4 mm Swollen: 0 ; Tender: 8  Joint Exam 05/17/2020      Right  Left  CMC      Tender  MCP 1      Tender  MCP 2   Tender   Tender  MCP 3   Tender   Tender  Ankle   Tender   Tender     Investigation: No additional findings.  Imaging: No results found.  Recent Labs: Lab Results  Component Value Date   WBC 4.5 04/12/2020   HGB 13.0 04/12/2020   PLT 233 04/12/2020   NA 141 04/12/2020   K 4.6 04/12/2020   CL 104 04/12/2020   CO2 28 04/12/2020   GLUCOSE 105 (H) 04/12/2020   BUN 15 04/12/2020   CREATININE 0.59 04/12/2020   BILITOT 0.4 04/12/2020   ALKPHOS 110 05/03/2019   AST 26 04/12/2020   ALT 31 (H) 04/12/2020   PROT 6.8 04/12/2020   ALBUMIN 4.6 05/03/2019   CALCIUM 9.7 04/12/2020   GFRAA 117 04/12/2020   QFTBGOLDPLUS NEGATIVE 07/26/2019    Speciality Comments: No specialty comments available.  Procedures:  No procedures performed Allergies: Cefdinir, Doxycycline, Codeine, Penicillins, and Simvastatin   Assessment / Plan:     Visit Diagnoses: Rheumatoid arthritis of multiple sites with negative rheumatoid factor ([place]) - (Discontinued Humira due to inadequate response (11/23/19-02/15/20)  Discontinued MTX due to increased joint pain): She has no synovitis on examination today.  She has tenderness palpation of her bilateral second and third MCP joints and the left first MCP joint.  She continues to have pain and stiffness in both ankle joints and both feet.  She has good range of motion of both ankles with tenderness to palpation.  She is currently on Enbrel 50 mg subcutaneous injections once weekly and Arava 20 mg 1 tablet by mouth daily.  She completed a prednisone taper 2 days ago which improved her joint pain and inflammation significantly.  She also started taking natural anti-inflammatories after her last office visit which she feels has  alleviated some of her discomfort.  She has noticed a slightly worsening injection site reaction with the weekly Enbrel injections despite taking Zyrtec on a daily basis and using cortisone cream topically.  She recently received a new shipment of Enbrel which she would like to complete the course of prior to making any medication changes.  I would also like to see how she does on combination therapy without the use of prednisone to give a true representation of the efficacy of Enbrel and Arava.  She was given a handout of information about [facility] to review in case may have to switch off of Enbrel in the future due to injection site reactions or inadequate response.  She has been under a tremendous amount of stress since her youngest son was diagnosed with leukemia.  He is currently hospitalized and she has having to commute back and forth to [facility].  She has been sleeping in the hospital on a nightly basis which she feels has been exacerbating her fatigue and generalized myalgias and arthralgias.  We will be extending her leave of absence until June 21, 2020.  She will follow-up back in the office on June 18, 2020 to reevaluate.  High risk medication use - Enbrel 50 mg subcutaneous injections once weekly and Arava 20 mg 1 tablet by mouth daily. TB gold negative on 07/26/2019.  Future order for TB gold is in place.  CBC and CMP were updated on 04/12/2020.  She will be due to update lab work in May and every 3 months to monitor for drug  toxicity.  Standing orders for CBC and CMP remain in place. She has received 3 Pfizer COVID-19 vaccine doses.  We discussed that she is eligible to receive the fourth Pfizer vaccine. We also discussed the importance of holding Enbrel and Arava if she develops signs or symptoms of an infection and to resume once infection has completely cleared.  Osteopenia of multiple sites - DEXA updated on 08/12/19: The BMD measured at Forearm Radius 33% is 0.751 g/cm2 with a T-score of  -1.4.  She is taking vitamin D 2000 units daily.  Status post total shoulder replacement, right: She has good range of motion with no discomfort at this time.   Primary osteoarthritis of both hands: She has PIP and DIP thickening consistent with osteoarthritis of both hands.  Tenderness to palpation over the left [facility] joint noted.  We discussed the use of a left CMC joint brace.  She was encouraged to continue to take the natural anti-inflammatories discussed at her last office visit.  We also discussed the importance of joint protection and muscle strengthening.  Primary osteoarthritis of both feet: She has PIP and DIP thickening consistent with osteoarthritis of both feet.  She has good range of motion of both ankle joints with tenderness palpation.  Mild dorsal spur is noted bilaterally.  No tenderness over MTP joints.  She continues to experience pain in both feet especially in the evenings or if she has been standing for prolonged periods of time.  We discussed the importance of wearing proper fitting shoes.  Other medical conditions are listed as follows:  Essential hypertension  Hashimoto's thyroiditis  Hyperparathyroidism, primary ([place])  History of gastroesophageal reflux (GERD)  Hx of migraines  History of miscarriage  Anxiety and depression  History of hyperlipidemia  Former smoker  Orders: No orders of the defined types were placed in this encounter.  No orders of the defined types were placed in this encounter.    Follow-Up Instructions: Return in about 3 months (around 08/17/2020) for Rheumatoid arthritis, Osteoarthritis.   Ofilia Neas, PA-C  Note - This record has been created using Dragon software.  Chart creation errors have been sought, but may not always  have been located. Such creation errors do not reflect on  the standard of medical care.

## 2020-05-09 ENCOUNTER — Other Ambulatory Visit: Payer: Self-pay | Admitting: Family Medicine

## 2020-05-10 ENCOUNTER — Other Ambulatory Visit: Payer: Self-pay | Admitting: Physician Assistant

## 2020-05-10 DIAGNOSIS — M0609 Rheumatoid arthritis without rheumatoid factor, multiple sites: Secondary | ICD-10-CM

## 2020-05-16 ENCOUNTER — Telehealth: Payer: Self-pay | Admitting: Pulmonary Disease

## 2020-05-16 NOTE — Telephone Encounter (Signed)
Called and left a message for Demetrius Charity with [facility], [phone] regarding the status of the CPAP machine ordered for this patient on 12/13/2019.  Requested a return call with status update.  Advised I would check with Dr. Elsworth Soho regarding the Luna CPAP machine.  Dr. Elsworth Soho, Please advise if it is ok to have [employer] order the Luna CPAP machine for this patient as she has been waiting.  Thank you.

## 2020-05-16 NOTE — Telephone Encounter (Signed)
Received a return call from [facility] with [facility].  It looks like her case has been sent to the local [place] branch, he believes this puts her closer to getting her CPAP machine.  He is sending out an e-mail to the local branch and the scheduling staff to get patient updated on when she should receive her CPAP machine.  Leroy Sea states that Dr. Elsworth Soho gave the [place] for her to use the Luna, I do not see a note stating this in the chart.  Leroy Sea will get the patient and our office updated information.  Will await response from [employer]. [employer] to reach out to patient with update.

## 2020-05-17 ENCOUNTER — Encounter: Payer: Self-pay | Admitting: Physician Assistant

## 2020-05-17 ENCOUNTER — Ambulatory Visit (INDEPENDENT_AMBULATORY_CARE_PROVIDER_SITE_OTHER): Payer: No Typology Code available for payment source | Admitting: Physician Assistant

## 2020-05-17 ENCOUNTER — Other Ambulatory Visit: Payer: Self-pay

## 2020-05-17 VITALS — BP 139/94 | HR 103 | Ht 65.0 in | Wt 202.6 lb

## 2020-05-17 DIAGNOSIS — Z96611 Presence of right artificial shoulder joint: Secondary | ICD-10-CM

## 2020-05-17 DIAGNOSIS — E210 Primary hyperparathyroidism: Secondary | ICD-10-CM

## 2020-05-17 DIAGNOSIS — M0609 Rheumatoid arthritis without rheumatoid factor, multiple sites: Secondary | ICD-10-CM | POA: Diagnosis not present

## 2020-05-17 DIAGNOSIS — Z8639 Personal history of other endocrine, nutritional and metabolic disease: Secondary | ICD-10-CM

## 2020-05-17 DIAGNOSIS — Z79899 Other long term (current) drug therapy: Secondary | ICD-10-CM | POA: Diagnosis not present

## 2020-05-17 DIAGNOSIS — M8589 Other specified disorders of bone density and structure, multiple sites: Secondary | ICD-10-CM | POA: Diagnosis not present

## 2020-05-17 DIAGNOSIS — Z8669 Personal history of other diseases of the nervous system and sense organs: Secondary | ICD-10-CM

## 2020-05-17 DIAGNOSIS — F32A Depression, unspecified: Secondary | ICD-10-CM

## 2020-05-17 DIAGNOSIS — E063 Autoimmune thyroiditis: Secondary | ICD-10-CM

## 2020-05-17 DIAGNOSIS — Z8759 Personal history of other complications of pregnancy, childbirth and the puerperium: Secondary | ICD-10-CM

## 2020-05-17 DIAGNOSIS — I10 Essential (primary) hypertension: Secondary | ICD-10-CM

## 2020-05-17 DIAGNOSIS — M19071 Primary osteoarthritis, right ankle and foot: Secondary | ICD-10-CM

## 2020-05-17 DIAGNOSIS — Z8719 Personal history of other diseases of the digestive system: Secondary | ICD-10-CM

## 2020-05-17 DIAGNOSIS — M19042 Primary osteoarthritis, left hand: Secondary | ICD-10-CM

## 2020-05-17 DIAGNOSIS — M19041 Primary osteoarthritis, right hand: Secondary | ICD-10-CM

## 2020-05-17 DIAGNOSIS — M19072 Primary osteoarthritis, left ankle and foot: Secondary | ICD-10-CM

## 2020-05-17 DIAGNOSIS — Z87891 Personal history of nicotine dependence: Secondary | ICD-10-CM

## 2020-05-17 DIAGNOSIS — F419 Anxiety disorder, unspecified: Secondary | ICD-10-CM

## 2020-05-17 NOTE — Patient Instructions (Signed)
Standing Labs We placed an order today for your standing lab work.   Please have your standing labs drawn in May and every 3 months   If possible, please have your labs drawn 2 weeks prior to your appointment so that the provider can discuss your results at your appointment.  We have open lab daily Monday through Thursday from 1:30-4:30 PM and Friday from 1:30-4:00 PM at the office of Dr. Shaili Deveshwar, [facility].   Please be advised, all patients with office appointments requiring lab work will take precedents over walk-in lab work.  If possible, please come for your lab work on Monday and Friday afternoons, as you may experience shorter wait times. The office is located at [address], [address], [place], [place] [address] No appointment is necessary.   Labs are drawn by [facility]. Please bring your co-pay at the time of your lab draw.  You may receive a bill from [facility] for your lab work.  If you wish to have your labs drawn at another location, please call the office 24 hours in advance to send orders.  If you have any questions regarding directions or hours of operation,  please call [phone].   As a reminder, please drink plenty of water prior to coming for your lab work. Thanks!   

## 2020-05-21 ENCOUNTER — Other Ambulatory Visit: Payer: Self-pay

## 2020-05-21 ENCOUNTER — Telehealth (INDEPENDENT_AMBULATORY_CARE_PROVIDER_SITE_OTHER): Payer: No Typology Code available for payment source | Admitting: Family

## 2020-05-21 DIAGNOSIS — K529 Noninfective gastroenteritis and colitis, unspecified: Secondary | ICD-10-CM

## 2020-05-21 DIAGNOSIS — R197 Diarrhea, unspecified: Secondary | ICD-10-CM

## 2020-05-21 NOTE — Addendum Note (Signed)
Addended by: Manuela Schwartz on: 05/21/2020 02:54 PM   Modules accepted: Orders

## 2020-05-21 NOTE — Progress Notes (Signed)
Virtual Visit via Video Note  I connected with Sandra [name] on 05/21/20 at 11:40 AM EDT by a video enabled telemedicine application and verified that I am speaking with the correct person using two identifiers.  Location: Patient: home Provider: work   I discussed the limitations of evaluation and management by telemedicine and the availability of in person appointments. The patient expressed understanding and agreed to proceed. Only the patient and myself were present for today's video call.   History of Present Illness:  Patient is a 60 year old female who presents today with chief complaint of diarrhea.  She reports that she developed explosive diarrhea last Wednesday.  This was following a meal of Mediterranean food.  No associated stomach cramps or fever.  Today she has epigastric cramping.  Continues to have explosive watery diarrhea.  Reports that she had 4 loose stools overnight and has had 3-4 this a.m.  She has a history of hemorrhoids and reports that her hemorrhoids are bleeding in the skin and the rectal area is very sore.  Had a negative home covid test.  She has been in/out of the hospital for the last few weeks as her son is currently hospitalized with AML.  She reports that she is tolerating PO's.  She is trying to drink fluids.     Past Medical History:  Diagnosis Date  . Allergic rhinitis 05/31/2012  . Chest pain    PT SAW CARDIOLOGIST DR. Burt Knack - STRESS ECHO DONE NEGATIVE - NO PROBLEM SINCE  . Chicken pox as achild  . Cough 11/29/2012   RESOLVED - IT WAS CAUSED BY LISINOPRIL  . Elevated LFTs 05/31/2012  . Eustachian tube dysfunction 07/27/2017  . GERD (gastroesophageal reflux disease)   . H/O tobacco use, presenting hazards to health 11/29/2012   Smoked 1 1/2 ppd for 20 years quit in roughtly 2007  . Headache 11/25/2015  . Hematuria 04/09/2014  . Hoarseness 02/04/2012   RESOLVED - THOUGHT TO HAVE BEEN ALLERY RELATED  . Hyperglycemia 04/06/2014  . Hyperlipidemia   .  Hyperparathyroidism ([place])   . Hypertension   . Obesity, unspecified 05/31/2012  . Ocular migraine   . Overactive bladder 05/31/2012  . Preventative health care 02/04/2012  . PTSD (post-traumatic stress disorder)   . Rapid heart beat    PT FEELS PROB RELATED TO HYPER PARATHYROIDISM  . Rheumatoid arthritis ([place])   . SUI (stress urinary incontinence, female) 02/04/2012   Sees Dr Perlie Gold and they have discussed a bladder tack but so far she declines     Social History   Socioeconomic History  . Marital status: Married    Spouse name: Not on file  . Number of children: 2  . Years of education: Not on file  . Highest education level: Not on file  Occupational History  . Occupation: Programmer, multimedia: [employer]  Tobacco Use  . Smoking status: Former Smoker    Packs/day: 1.00    Years: 20.00    Pack years: 20.00    Types: Cigarettes    Quit date: 03/10/2005    Years since quitting: 15.2  . Smokeless tobacco: Never Used  Vaping Use  . Vaping Use: Never used  Substance and Sexual Activity  . Alcohol use: Not Currently  . Drug use: No  . Sexual activity: Yes    Partners: Male  Other Topics Concern  . Not on file  Social History Narrative  . Not on file   Social Determinants of Health  Financial Resource Strain: Not on file  Food Insecurity: Not on file  Transportation Needs: Not on file  Physical Activity: Not on file  Stress: Not on file  Social Connections: Not on file  Intimate Partner Violence: Not on file    Past Surgical History:  Procedure Laterality Date  . BREAST EXCISIONAL BIOPSY Left    Age 57 Fibroadenoma  . BREAST SURGERY     left breast excision of adenoma  . btl    . PARATHYROIDECTOMY Right 08/12/2013   Procedure: right inferior frozen section PARATHYROIDECTOMY;  Surgeon: Earnstine Regal, MD;  Location: [facility];  Service: General;  Laterality: Right;  . PELVIC LAPAROSCOPY  03-2004  . TOTAL SHOULDER REPLACEMENT Right 01/2019  . TUBAL LIGATION    .  WISDOM TOOTH EXTRACTION  30 yrs ago    Family History  Problem Relation Age of Onset  . Hypertension Mother 82       alive  . Renal cancer Mother        renal  . Hyperlipidemia Mother   . Osteoporosis Mother        humerus   . Macular degeneration Mother   . Tremor Mother   . Parkinson's disease Mother   . Hypertension Father 36       alive  . Atrial fibrillation Father   . Colon cancer Father        colon  . Hyperlipidemia Father   . Stroke Father   . Alzheimer's disease Father   . Hypertension Brother 36       alive  . Tremor Brother   . Hypertension Sister   . Lung cancer Maternal Grandfather        lung/ smoker  . Emphysema Maternal Grandfather   . Heart disease Paternal Grandmother   . Other Paternal Grandfather        black lung  . Heart disease Paternal Grandfather   . Asthma Son   . Eczema Son   . Healthy Son   . Healthy Son   . Heart disease Paternal Aunt   . Heart disease Paternal Uncle     Allergies  Allergen Reactions  . Cefdinir Other (See Comments)    Severe Stomach cramps and diarrhea for several days.  . Doxycycline Diarrhea  . Codeine Nausea And Vomiting  . Penicillins     RASH, ITCHING  . Simvastatin     myalgias    Current Outpatient Medications on File Prior to Visit  Medication Sig Dispense Refill  . aspirin 325 MG tablet Take 325 mg by mouth daily.    Marland Kitchen atorvastatin (LIPITOR) 40 MG tablet Take 1 tablet (40 mg total) by mouth daily. 90 tablet 1  . cetirizine (ZYRTEC) 10 MG tablet Take 10 mg by mouth daily.    . Cholecalciferol (VITAMIN D PO) Take 2,000 mg by mouth daily. Vitamin D    . Co-Enzyme Q-10 30 MG CAPS Take 100 mg by mouth at bedtime.    . DOCOSAHEXAENOIC ACID PO Take 1 tablet by mouth daily.    Marland Kitchen escitalopram (LEXAPRO) 10 MG tablet Take 1 tablet (10 mg total) by mouth daily. 90 tablet 1  . Etanercept (ENBREL MINI) 50 MG/ML SOCT Inject 50 mg into the skin every 7 (seven) days. 12 mL 0  . furosemide (LASIX) 20 MG tablet TAKE  1 TABLET BY MOUTH EVERY DAY 90 tablet 1  . Ginger, Zingiber officinalis, (GINGER PO) Take by mouth.    . irbesartan (AVAPRO) 150 MG tablet  Take 1 tablet (150 mg total) by mouth daily. 90 tablet 1  . lactobacillus acidophilus (BACID) TABS tablet Take 2 tablets by mouth 3 (three) times daily.    Marland Kitchen leflunomide (ARAVA) 20 MG tablet TAKE 1 TABLET BY MOUTH EVERY DAY 90 tablet 0  . levothyroxine (SYNTHROID) 75 MCG tablet Take 1 tablet (75 mcg total) by mouth daily before breakfast. 30 tablet 0  . metoprolol succinate (TOPROL-XL) 25 MG 24 hr tablet TAKE 1 TABLET BY MOUTH EVERY DAY 30 tablet 2  . Omega-3 Fatty Acids (OMEGA 3 PO) Take by mouth.    Marland Kitchen omeprazole (PRILOSEC) 40 MG capsule TAKE 1 CAPSULE BY MOUTH EVERY DAY 90 capsule 3  . OVER THE COUNTER MEDICATION Take 1 capsule by mouth daily. Mega red- 1 capsule daily    . predniSONE (DELTASONE) 5 MG tablet Take 4 tablets by mouth daily x1wk, 3 tablets by mouth daily x1wk, 2 tablets by mouth daily x1wk, 1 tablet by mouth daily x1wk. 70 tablet 0  . TART CHERRY PO Take by mouth.    . Trospium Chloride 60 MG CP24 trospium ER 60 mg capsule,extended release 24 hr  TAKE 1 CAPSULE BY MOUTH EVERY DAY    . TURMERIC PO Take 1 tablet by mouth 2 (two) times daily.    . verapamil (CALAN-SR) 120 MG CR tablet TAKE 1 TABLET BY MOUTH EVERYDAY AT BEDTIME 90 tablet 3   No current facility-administered medications on file prior to visit.    LMP 04/01/2011   Observations/Objective:   Gen: Awake, alert, no acute distress Resp: Breathing is even and non-labored Psych: calm/pleasant demeanor Neuro: Alert and Oriented x 3, + facial symmetry, speech is clear. Skin: Lips appear dry  Assessment and Plan:  Acute gastroenteritis-etiology unclear.  She has not had any recent antibiotic use, therefore I think risk of C. difficile is low.  I have advised the patient to begin Imodium as needed.  Discussed importance of aggressive hydration.  She understands that should she  develop weakness that she should proceed to the ER for IV fluids and further evaluation.  I would like for her to complete a stool profile for further evaluation.  She will come by the office to pick up the kit this afternoon.  She is to return at her earliest convenience.  Follow Up Instructions:    I discussed the assessment and treatment plan with the patient. The patient was provided an opportunity to ask questions and all were answered. The patient agreed with the plan and demonstrated an understanding of the instructions.   The patient was advised to call back or seek an in-person evaluation if the symptoms worsen or if the condition fails to improve as anticipated.  Nance Pear, NP

## 2020-05-22 ENCOUNTER — Other Ambulatory Visit: Payer: Self-pay

## 2020-05-22 ENCOUNTER — Other Ambulatory Visit (INDEPENDENT_AMBULATORY_CARE_PROVIDER_SITE_OTHER): Payer: No Typology Code available for payment source

## 2020-05-22 ENCOUNTER — Telehealth: Payer: Self-pay

## 2020-05-22 ENCOUNTER — Encounter: Payer: Self-pay | Admitting: *Deleted

## 2020-05-22 DIAGNOSIS — R197 Diarrhea, unspecified: Secondary | ICD-10-CM | POA: Diagnosis not present

## 2020-05-22 NOTE — Telephone Encounter (Signed)
Mailbox is full, sent a MyChart message, will try again later.

## 2020-05-22 NOTE — Telephone Encounter (Signed)
Patient called stating she has been experiencing diarrhea for the past week and had appointment with her PCP yesterday.  Patient states they took a stool sample to see if she has an infection.  Patient is requesting a return call to let her know if she should discontinue her [place] and Enbrel until she receives the results.

## 2020-05-22 NOTE — Telephone Encounter (Signed)
Please advise 

## 2020-05-22 NOTE — Telephone Encounter (Signed)
I called patient, patient verbalized understanding. 

## 2020-05-22 NOTE — Telephone Encounter (Signed)
She may discontinue Arava and Enbrel.  Once the diarrhea resolves she may restart medications.

## 2020-05-23 LAB — GI PROFILE, STOOL, PCR
Adenovirus F 40/41: NOT DETECTED
Astrovirus: NOT DETECTED
C difficile toxin A/B: NOT DETECTED
Campylobacter: NOT DETECTED
Cryptosporidium: NOT DETECTED
Cyclospora cayetanensis: NOT DETECTED
Entamoeba histolytica: NOT DETECTED
Enteroaggregative E coli: NOT DETECTED
Enteropathogenic E coli: NOT DETECTED
Enterotoxigenic E coli: DETECTED — AB
Giardia lamblia: NOT DETECTED
Norovirus GI/GII: NOT DETECTED
Plesiomonas shigelloides: NOT DETECTED
Rotavirus A: NOT DETECTED
Salmonella: NOT DETECTED
Sapovirus: NOT DETECTED
Shiga-toxin-producing E coli: NOT DETECTED
Shigella/Enteroinvasive E coli: NOT DETECTED
Vibrio cholerae: NOT DETECTED
Vibrio: NOT DETECTED
Yersinia enterocolitica: NOT DETECTED

## 2020-05-24 ENCOUNTER — Telehealth: Payer: Self-pay | Admitting: Family

## 2020-05-24 NOTE — Telephone Encounter (Signed)
See mychart.  

## 2020-05-24 NOTE — Telephone Encounter (Signed)
Called and spoke with patient who states that she got Luna machine yesterday. She has been set up for an appointment on 5/6 for compliance visit. Advised patient to bring machine and everything with her to her appointment. She expressed understanding. Nothing further needed at this time.

## 2020-05-24 NOTE — Telephone Encounter (Signed)
Pt calling to inform RA that she received her cpap machine yesterday. Pt can be reached at [phone].

## 2020-05-25 ENCOUNTER — Other Ambulatory Visit: Payer: Self-pay | Admitting: Family Medicine

## 2020-05-25 ENCOUNTER — Telehealth: Payer: Self-pay | Admitting: *Deleted

## 2020-05-25 ENCOUNTER — Other Ambulatory Visit: Payer: Self-pay | Admitting: Rheumatology

## 2020-05-25 DIAGNOSIS — R739 Hyperglycemia, unspecified: Secondary | ICD-10-CM

## 2020-05-25 DIAGNOSIS — E782 Mixed hyperlipidemia: Secondary | ICD-10-CM

## 2020-05-25 DIAGNOSIS — E210 Primary hyperparathyroidism: Secondary | ICD-10-CM

## 2020-05-25 DIAGNOSIS — I10 Essential (primary) hypertension: Secondary | ICD-10-CM

## 2020-05-25 DIAGNOSIS — M0609 Rheumatoid arthritis without rheumatoid factor, multiple sites: Secondary | ICD-10-CM

## 2020-05-25 DIAGNOSIS — R7989 Other specified abnormal findings of blood chemistry: Secondary | ICD-10-CM

## 2020-05-25 MED ORDER — ENBREL MINI 50 MG/ML ~~LOC~~ SOCT: 50.0000 mg | SUBCUTANEOUS | 0 refills | Status: DC

## 2020-05-25 NOTE — Telephone Encounter (Signed)
Patient requesting refill on Enbrel sent to [place]. Patient thought she would have enough until next follow up appointment, but she will not.

## 2020-05-25 NOTE — Telephone Encounter (Signed)
Next Visit: 06/18/2020  Last Visit: 05/17/2020  Last Fill: 03/15/2020  [id number] arthritis of multiple sites with negative rheumatoid factor  Current Dose per office note on 05/17/2020: Enbrel 50 mg subcutaneous injections once weekly  Labs: 04/12/2020 CBC WNL. ALT borderline elevated but stable. Please advise the patient to avoid tylenol, NSAIDs, and alcohol use. Rest of CMP WNL. We will continue to monitor.   TB Gold: 07/26/2019 negative   Okay to refill enbrel?

## 2020-05-25 NOTE — Telephone Encounter (Signed)
Pt has lab appt scheduled on Monday but I don't see any orders in Epic.  Please place orders if appropriate or call pt to cancel lab appt if not needed at this time.

## 2020-05-25 NOTE — Telephone Encounter (Signed)
ordered

## 2020-05-28 ENCOUNTER — Other Ambulatory Visit: Payer: No Typology Code available for payment source

## 2020-05-28 NOTE — Telephone Encounter (Signed)
Thank you :)

## 2020-05-29 ENCOUNTER — Other Ambulatory Visit: Payer: Self-pay

## 2020-05-29 ENCOUNTER — Ambulatory Visit (INDEPENDENT_AMBULATORY_CARE_PROVIDER_SITE_OTHER): Payer: No Typology Code available for payment source | Admitting: Family Medicine

## 2020-05-29 VITALS — BP 128/82 | HR 88 | Temp 98.2°F | Resp 18 | Wt 201.0 lb

## 2020-05-29 DIAGNOSIS — E782 Mixed hyperlipidemia: Secondary | ICD-10-CM | POA: Diagnosis not present

## 2020-05-29 DIAGNOSIS — E210 Primary hyperparathyroidism: Secondary | ICD-10-CM | POA: Diagnosis not present

## 2020-05-29 DIAGNOSIS — F419 Anxiety disorder, unspecified: Secondary | ICD-10-CM | POA: Diagnosis not present

## 2020-05-29 DIAGNOSIS — R739 Hyperglycemia, unspecified: Secondary | ICD-10-CM

## 2020-05-29 DIAGNOSIS — F32A Depression, unspecified: Secondary | ICD-10-CM

## 2020-05-29 DIAGNOSIS — R252 Cramp and spasm: Secondary | ICD-10-CM

## 2020-05-29 DIAGNOSIS — G4733 Obstructive sleep apnea (adult) (pediatric): Secondary | ICD-10-CM

## 2020-05-29 DIAGNOSIS — I10 Essential (primary) hypertension: Secondary | ICD-10-CM

## 2020-05-29 NOTE — Assessment & Plan Note (Signed)
Encouraged heart healthy diet, increase exercise, avoid trans fats, consider a krill oil cap daily, tolerating statins.

## 2020-05-29 NOTE — Progress Notes (Signed)
Patient ID: Sandra [name], female    DOB: 11-28-1960  Age: 60 y.o. MRN: [id number]    Subjective:  Subjective  HPI Sandra [name] presents for office visit today.   She denies any chest pain, SOB, fever, abdominal pain, cough, chills, sore throat, dysuria, urinary incontinence, back pain, HA, or N/VD. She is very sad and stressed over her son's recent diagnosis of AML just as he was finishing his previous cancer treatment. She feels the Lexapro is helping her manage the stress very well. She has only needed one dose of Alprazolam and that was helpful. She denies any recent hospitalizations for herself.    Review of Systems  Constitutional: Positive for fatigue. Negative for chills and fever.  HENT: Negative for congestion, rhinorrhea, sinus pressure, sinus pain and sore throat.   Eyes: Negative for pain.  Respiratory: Negative for shortness of breath.   Cardiovascular: Negative for chest pain, palpitations and leg swelling.  Gastrointestinal: Negative for abdominal pain, blood in stool, diarrhea, nausea and vomiting.  Genitourinary: Negative for flank pain, frequency, vaginal bleeding, vaginal discharge and vaginal pain.  Musculoskeletal: Negative for myalgias.  Neurological: Negative for headaches.  Psychiatric/Behavioral: The patient is not nervous/anxious.     History Past Medical History:  Diagnosis Date  . Allergic rhinitis 05/31/2012  . Chest pain    PT SAW CARDIOLOGIST DR. Burt Knack - STRESS ECHO DONE NEGATIVE - NO PROBLEM SINCE  . Chicken pox as achild  . Cough 11/29/2012   RESOLVED - IT WAS CAUSED BY LISINOPRIL  . Elevated LFTs 05/31/2012  . Eustachian tube dysfunction 07/27/2017  . GERD (gastroesophageal reflux disease)   . H/O tobacco use, presenting hazards to health 11/29/2012   Smoked 1 1/2 ppd for 20 years quit in roughtly 2007  . Headache 11/25/2015  . Hematuria 04/09/2014  . Hoarseness 02/04/2012   RESOLVED - THOUGHT TO HAVE BEEN ALLERY RELATED  . Hyperglycemia  04/06/2014  . Hyperlipidemia   . Hyperparathyroidism ([place])   . Hypertension   . Obesity, unspecified 05/31/2012  . Ocular migraine   . Overactive bladder 05/31/2012  . Preventative health care 02/04/2012  . PTSD (post-traumatic stress disorder)   . Rapid heart beat    PT FEELS PROB RELATED TO HYPER PARATHYROIDISM  . Rheumatoid arthritis ([place])   . SUI (stress urinary incontinence, female) 02/04/2012   Sees Dr Perlie Gold and they have discussed a bladder tack but so far she declines    She has a past surgical history that includes Pelvic laparoscopy (03-2004); Wisdom tooth extraction (30 yrs ago); btl; Tubal ligation; Breast surgery; Parathyroidectomy (Right, 08/12/2013); Breast excisional biopsy (Left); and Total shoulder replacement (Right, 01/2019).   Her family history includes Alzheimer's disease in her father; Asthma in her son; Atrial fibrillation in her father; Colon cancer in her father; Eczema in her son; Emphysema in her maternal grandfather; Healthy in her son and son; Heart disease in her paternal aunt, paternal grandfather, paternal grandmother, and paternal uncle; Hyperlipidemia in her father and mother; Hypertension in her sister; Hypertension (age of onset: 63) in her brother; Hypertension (age of onset: 57) in her mother; Hypertension (age of onset: 40) in her father; Lung cancer in her maternal grandfather; Macular degeneration in her mother; Osteoporosis in her mother; Other in her paternal grandfather; Parkinson's disease in her mother; Renal cancer in her mother; Stroke in her father; Tremor in her brother and mother.She reports that she quit smoking about 15 years ago. Her smoking use included cigarettes. She has a 20.00  pack-year smoking history. She has never used smokeless tobacco. She reports previous alcohol use. She reports that she does not use drugs.  Current Outpatient Medications on File Prior to Visit  Medication Sig Dispense Refill  . aspirin 325 MG tablet Take 325 mg  by mouth daily.    Marland Kitchen atorvastatin (LIPITOR) 40 MG tablet Take 1 tablet (40 mg total) by mouth daily. 90 tablet 1  . cetirizine (ZYRTEC) 10 MG tablet Take 10 mg by mouth daily.    . Cholecalciferol (VITAMIN D PO) Take 2,000 mg by mouth daily. Vitamin D    . Co-Enzyme Q-10 30 MG CAPS Take 100 mg by mouth at bedtime.    . DOCOSAHEXAENOIC ACID PO Take 1 tablet by mouth daily.    Marland Kitchen escitalopram (LEXAPRO) 10 MG tablet Take 1 tablet (10 mg total) by mouth daily. 90 tablet 1  . Etanercept (ENBREL MINI) 50 MG/ML SOCT Inject 50 mg into the skin every 7 (seven) days. 12 mL 0  . furosemide (LASIX) 20 MG tablet TAKE 1 TABLET BY MOUTH EVERY DAY 90 tablet 1  . Ginger, Zingiber officinalis, (GINGER PO) Take by mouth.    . irbesartan (AVAPRO) 150 MG tablet Take 1 tablet (150 mg total) by mouth daily. 90 tablet 1  . lactobacillus acidophilus (BACID) TABS tablet Take 2 tablets by mouth 3 (three) times daily.    Marland Kitchen leflunomide (ARAVA) 20 MG tablet TAKE 1 TABLET BY MOUTH EVERY DAY 90 tablet 0  . levothyroxine (SYNTHROID) 75 MCG tablet Take 1 tablet (75 mcg total) by mouth daily before breakfast. 30 tablet 0  . metoprolol succinate (TOPROL-XL) 25 MG 24 hr tablet TAKE 1 TABLET BY MOUTH EVERY DAY 30 tablet 2  . Omega-3 Fatty Acids (OMEGA 3 PO) Take by mouth.    Marland Kitchen omeprazole (PRILOSEC) 40 MG capsule TAKE 1 CAPSULE BY MOUTH EVERY DAY 90 capsule 3  . OVER THE COUNTER MEDICATION Take 1 capsule by mouth daily. Mega red- 1 capsule daily    . predniSONE (DELTASONE) 5 MG tablet Take 4 tablets by mouth daily x1wk, 3 tablets by mouth daily x1wk, 2 tablets by mouth daily x1wk, 1 tablet by mouth daily x1wk. 70 tablet 0  . TART CHERRY PO Take by mouth.    . Trospium Chloride 60 MG CP24 trospium ER 60 mg capsule,extended release 24 hr  TAKE 1 CAPSULE BY MOUTH EVERY DAY    . TURMERIC PO Take 1 tablet by mouth 2 (two) times daily.    . verapamil (CALAN-SR) 120 MG CR tablet TAKE 1 TABLET BY MOUTH EVERYDAY AT BEDTIME 90 tablet 3   No  current facility-administered medications on file prior to visit.     Objective:  Objective  Physical Exam Vitals and nursing note reviewed.  Constitutional:      General: She is not in acute distress.    Appearance: Normal appearance. She is well-developed. She is not ill-appearing.  HENT:     Head: Normocephalic and atraumatic.     Right Ear: External ear normal.     Left Ear: External ear normal.     Nose: Nose normal.  Eyes:     General:        Right eye: No discharge.        Left eye: No discharge.     Extraocular Movements: Extraocular movements intact.     Pupils: Pupils are equal, round, and reactive to light.  Cardiovascular:     Rate and Rhythm: Normal rate and regular  rhythm.     Pulses: Normal pulses.     Heart sounds: Normal heart sounds. No murmur heard.   Pulmonary:     Effort: Pulmonary effort is normal.     Breath sounds: Normal breath sounds.  Abdominal:     General: Bowel sounds are normal.     Palpations: Abdomen is soft. There is no mass.     Tenderness: There is no abdominal tenderness. There is no guarding.  Musculoskeletal:     Cervical back: Normal range of motion and neck supple.  Skin:    General: Skin is warm and dry.  Neurological:     Mental Status: She is alert and oriented to person, place, and time.  Psychiatric:        Behavior: Behavior normal.    BP 128/82   Pulse 88   Temp 98.2 F (36.8 C)   Resp 18   Wt 201 lb (91.2 kg)   LMP 04/01/2011   SpO2 93%   BMI 33.45 kg/m  Wt Readings from Last 3 Encounters:  05/29/20 201 lb (91.2 kg)  05/17/20 202 lb 9.6 oz (91.9 kg)  04/19/20 207 lb (93.9 kg)     Lab Results  Component Value Date   WBC 5.2 05/29/2020   HGB 12.3 05/29/2020   HCT 36.1 05/29/2020   PLT 261.0 05/29/2020   GLUCOSE 96 05/29/2020   CHOL 183 05/29/2020   TRIG 239.0 (H) 05/29/2020   HDL 41.60 05/29/2020   LDLDIRECT 112.0 05/29/2020   LDLCALC 167 (H) 12/13/2019   ALT 38 (H) 05/29/2020   AST 31 05/29/2020    NA 141 05/29/2020   K 4.2 05/29/2020   CL 103 05/29/2020   CREATININE 0.63 05/29/2020   BUN 12 05/29/2020   CO2 30 05/29/2020   TSH 1.39 05/29/2020   HGBA1C 5.7 05/29/2020    ECHOCARDIOGRAM COMPLETE  Result Date: 09/27/2019    ECHOCARDIOGRAM REPORT   Patient Name:   Sandra [name] [facility] Date of Exam: 09/23/2019 Medical Rec #:  [id number]         Height:       64.0 in Accession #:    [id number]        Weight:       209.6 lb Date of Birth:  04-13-1960        BSA:          1.996 m Patient Age:    39 years          BP:           142/86 mmHg Patient Gender: F                 HR:           72 bpm. Exam Location:  [place] Procedure: 2D Echo, Cardiac Doppler and Color Doppler Indications:    R06.02 SOB; R06.9 DOE  History:        Patient has prior history of Echocardiogram examinations, most                 recent 01/13/2013. Signs/Symptoms:Shortness of Breath and                 Dyspnea; Risk Factors:Sleep Apnea, Former Smoker, Hypertension                 and Dyslipidemia. Patient has experienced DOE with SOB for about                 1 year.  Sonographer:  Alecia Mackin RVT, RDCS (AE), RDMS Referring Phys: 23 Derika Eckles A [place]  1. Left ventricular ejection fraction, by estimation, is 60 to 65%. The left ventricle has normal function. The left ventricle has no regional wall motion abnormalities. Left ventricular diastolic parameters are consistent with Grade I diastolic dysfunction (impaired relaxation).  2. Right ventricular systolic function is normal. The right ventricular size is normal. There is normal pulmonary artery systolic pressure.  3. The mitral valve is normal in structure. Trivial mitral valve regurgitation. No evidence of mitral stenosis.  4. The aortic valve is normal in structure. Aortic valve regurgitation is not visualized. No aortic stenosis is present.  5. The inferior vena cava is normal in size with greater than 50% respiratory variability, suggesting right atrial pressure  of 3 mmHg. Comparison(s): EF 65 %, negative bubble study. FINDINGS  Left Ventricle: Left ventricular ejection fraction, by estimation, is 60 to 65%. The left ventricle has normal function. The left ventricle has no regional wall motion abnormalities. The left ventricular internal cavity size was normal in size. There is  no left ventricular hypertrophy. Left ventricular diastolic parameters are consistent with Grade I diastolic dysfunction (impaired relaxation). Right Ventricle: The right ventricular size is normal. No increase in right ventricular wall thickness. Right ventricular systolic function is normal. There is normal pulmonary artery systolic pressure. The tricuspid regurgitant velocity is 2.54 m/s, and  with an assumed right atrial pressure of 10 mmHg, the estimated right ventricular systolic pressure is 85.2 mmHg. Left Atrium: Left atrial size was normal in size. Right Atrium: Right atrial size was normal in size. Pericardium: There is no evidence of pericardial effusion. Mitral Valve: The mitral valve is normal in structure. Normal mobility of the mitral valve leaflets. Trivial mitral valve regurgitation. No evidence of mitral valve stenosis. Tricuspid Valve: The tricuspid valve is normal in structure. Tricuspid valve regurgitation is trivial. No evidence of tricuspid stenosis. Aortic Valve: The aortic valve is normal in structure. Aortic valve regurgitation is not visualized. No aortic stenosis is present. Aortic valve mean gradient measures 5.0 mmHg. Aortic valve peak gradient measures 9.7 mmHg. Aortic valve area, by VTI measures 2.71 cm. Pulmonic Valve: The pulmonic valve was normal in structure. Pulmonic valve regurgitation is not visualized. No evidence of pulmonic stenosis. Aorta: The aortic root is normal in size and structure. Venous: The inferior vena cava is normal in size with greater than 50% respiratory variability, suggesting right atrial pressure of 3 mmHg. IAS/Shunts: No atrial level  shunt detected by color flow Doppler.  LEFT VENTRICLE PLAX 2D LVIDd:         4.51 cm  Diastology LVIDs:         2.58 cm  LV e' lateral:   13.50 cm/s LV PW:         1.22 cm  LV E/e' lateral: 6.3 LV IVS:        0.83 cm  LV e' medial:    6.64 cm/s LVOT diam:     2.00 cm  LV E/e' medial:  12.8 LV SV:         83 LV SV Index:   42 LVOT Area:     3.14 cm  RIGHT VENTRICLE RV S prime:     12.60 cm/s TAPSE (M-mode): 2.6 cm LEFT ATRIUM             Index       RIGHT ATRIUM           Index LA diam:  3.20 cm 1.60 cm/m  RA Area:     17.10 cm LA Vol (A2C):   70.6 ml 35.38 ml/m RA Volume:   45.30 ml  22.70 ml/m LA Vol (A4C):   62.9 ml 31.52 ml/m LA Biplane Vol: 67.9 ml 34.02 ml/m  AORTIC VALVE                   PULMONIC VALVE AV Area (Vmax):    2.15 cm    PV Vmax:       0.59 m/s AV Area (Vmean):   2.44 cm    PV Peak grad:  1.4 mmHg AV Area (VTI):     2.71 cm AV Vmax:           156.00 cm/s AV Vmean:          99.100 cm/s AV VTI:            0.306 m AV Peak Grad:      9.7 mmHg AV Mean Grad:      5.0 mmHg LVOT Vmax:         107.00 cm/s LVOT Vmean:        77.000 cm/s LVOT VTI:          0.264 m LVOT/AV VTI ratio: 0.86  AORTA Ao Root diam: 3.50 cm Ao Asc diam:  3.30 cm Ao Arch diam: 2.8 cm MITRAL VALVE                TRICUSPID VALVE MV Area (PHT): 4.36 cm     TR Peak grad:   25.8 mmHg MV Decel Time: 174 msec     TR Vmax:        254.00 cm/s MV E velocity: 84.80 cm/s MV A velocity: 105.00 cm/s  SHUNTS MV E/A ratio:  0.81         Systemic VTI:  0.26 m                             Systemic Diam: 2.00 cm Jenne Campus MD Electronically signed by Jenne Campus MD Signature Date/Time: 09/27/2019/12:54:46 PM    Final      Assessment & Plan:  Plan    No orders of the defined types were placed in this encounter.   Problem List Items Addressed This Visit    Hypertension    Well controlled, no changes to meds. Encouraged heart healthy diet such as the DASH diet and exercise as tolerated.       Relevant Orders   CBC  (Completed)   Comprehensive metabolic panel (Completed)   TSH (Completed)   Hyperlipidemia    Encouraged heart healthy diet, increase exercise, avoid trans fats, consider a krill oil cap daily, tolerating statins.      Relevant Orders   Lipid panel (Completed)   Hyperparathyroidism, primary ([place]) - Primary   Relevant Orders   PTH, intact (no Ca) (Completed)   Hyperglycemia    hgba1c acceptable, minimize simple carbs. Increase exercise as tolerated.       Relevant Orders   Hemoglobin A1c (Completed)   Muscle cramp    Hydrate and monitor      Relevant Orders   Magnesium (Completed)   OSA (obstructive sleep apnea)    Is tolerating her CPAP machine and is using it regularly, she feels more rested. It took almost a year since she knew she needed it.      Anxiety and depression    Is struggling  with her son having the new diagnosis of AML she feels the Lexapro is really helping her get through. She is using Alprazolam very rarely only once with good results. She will let us know if she needs any further concerns or changes         Follow-up: Return in about 6 months (around 11/15/2020).  I,Amal Renbarger,acting as a Education administrator for Penni Homans, MD.,have documented all relevant documentation on the behalf of Penni Homans, MD,as directed by  Penni Homans, MD while in the presence of Penni Homans, MD.  Medical screening examination/treatment was performed by qualified clinical staff member and as supervising physician I was immediately available for consultation/collaboration. I have reviewed documentation and agree with assessment and plan.  Penni Homans, MD

## 2020-05-29 NOTE — Assessment & Plan Note (Addendum)
Is struggling with her son having the new diagnosis of AML she feels the Lexapro is really helping her get through. She is using Alprazolam very rarely only once with good results. She will let us know if she needs any further concerns or changes

## 2020-05-29 NOTE — Assessment & Plan Note (Signed)
hgba1c acceptable, minimize simple carbs. Increase exercise as tolerated.  

## 2020-05-29 NOTE — Assessment & Plan Note (Signed)
Hydrate and monitor 

## 2020-05-29 NOTE — Assessment & Plan Note (Signed)
Well controlled, no changes to meds. Encouraged heart healthy diet such as the DASH diet and exercise as tolerated.  °

## 2020-05-29 NOTE — Patient Instructions (Signed)
Sleep Apnea Sleep apnea is a condition in which breathing pauses or becomes shallow during sleep. Episodes of sleep apnea usually last 10 seconds or longer, and they may occur as many as 20 times an hour. Sleep apnea disrupts your sleep and keeps your body from getting the rest that it needs. This condition can increase your risk of certain health problems, including:  Heart attack.  Stroke.  Obesity.  Diabetes.  Heart failure.  Irregular heartbeat. What are the causes? There are three kinds of sleep apnea:  Obstructive sleep apnea. This kind is caused by a blocked or collapsed airway.  Central sleep apnea. This kind happens when the part of the brain that controls breathing does not send the correct signals to the muscles that control breathing.  Mixed sleep apnea. This is a combination of obstructive and central sleep apnea. The most common cause of this condition is a collapsed or blocked airway. An airway can collapse or become blocked if:  Your throat muscles are abnormally relaxed.  Your tongue and tonsils are larger than normal.  You are overweight.  Your airway is smaller than normal.   What increases the risk? You are more likely to develop this condition if you:  Are overweight.  Smoke.  Have a smaller than normal airway.  Are elderly.  Are female.  Drink alcohol.  Take sedatives or tranquilizers.  Have a family history of sleep apnea. What are the signs or symptoms? Symptoms of this condition include:  Trouble staying asleep.  Daytime sleepiness and tiredness.  Irritability.  Loud snoring.  Morning headaches.  Trouble concentrating.  Forgetfulness.  Decreased interest in sex.  Unexplained sleepiness.  Mood swings.  Personality changes.  Feelings of depression.  Waking up often during the night to urinate.  Dry mouth.  Sore throat. How is this diagnosed? This condition may be diagnosed with:  A medical history.  A physical  exam.  A series of tests that are done while you are sleeping (sleep study). These tests are usually done in a sleep lab, but they may also be done at home. How is this treated? Treatment for this condition aims to restore normal breathing and to ease symptoms during sleep. It may involve managing health issues that can affect breathing, such as high blood pressure or obesity. Treatment may include:  Sleeping on your side.  Using a decongestant if you have nasal congestion.  Avoiding the use of depressants, including alcohol, sedatives, and narcotics.  Losing weight if you are overweight.  Making changes to your diet.  Quitting smoking.  Using a device to open your airway while you sleep, such as: ? An oral appliance. This is a custom-made mouthpiece that shifts your lower jaw forward. ? A continuous positive airway pressure (CPAP) device. This device blows air through a mask when you breathe out (exhale). ? A nasal expiratory positive airway pressure (EPAP) device. This device has valves that you put into each nostril. ? A bi-level positive airway pressure (BPAP) device. This device blows air through a mask when you breathe in (inhale) and breathe out (exhale).  Having surgery if other treatments do not work. During surgery, excess tissue is removed to create a wider airway. It is important to get treatment for sleep apnea. Without treatment, this condition can lead to:  High blood pressure.  Coronary artery disease.  In men, an inability to achieve or maintain an erection (impotence).  Reduced thinking abilities.   Follow these instructions at home: Lifestyle    Make any lifestyle changes that your health care provider recommends.  Eat a healthy, well-balanced diet.  Take steps to lose weight if you are overweight.  Avoid using depressants, including alcohol, sedatives, and narcotics.  Do not use any products that contain nicotine or tobacco, such as cigarettes,  e-cigarettes, and chewing tobacco. If you need help quitting, ask your health care provider. General instructions  Take over-the-counter and prescription medicines only as told by your health care provider.  If you were given a device to open your airway while you sleep, use it only as told by your health care provider.  If you are having surgery, make sure to tell your health care provider you have sleep apnea. You may need to bring your device with you.  Keep all follow-up visits as told by your health care provider. This is important. Contact a health care provider if:  The device that you received to open your airway during sleep is uncomfortable or does not seem to be working.  Your symptoms do not improve.  Your symptoms get worse. Get help right away if:  You develop: ? Chest pain. ? Shortness of breath. ? Discomfort in your back, arms, or stomach.  You have: ? Trouble speaking. ? Weakness on one side of your body. ? Drooping in your face. These symptoms may represent a serious problem that is an emergency. Do not wait to see if the symptoms will go away. Get medical help right away. Call your local emergency services (911 in the [place] Do not drive yourself to the hospital. Summary  Sleep apnea is a condition in which breathing pauses or becomes shallow during sleep.  The most common cause is a collapsed or blocked airway.  The goal of treatment is to restore normal breathing and to ease symptoms during sleep. This information is not intended to replace advice given to you by your health care provider. Make sure you discuss any questions you have with your health care provider. Document Revised: 08/11/2018 Document Reviewed: 10/20/2017 Elsevier Patient Education  2021 [employer]  

## 2020-05-29 NOTE — Assessment & Plan Note (Signed)
Is tolerating her CPAP machine and is using it regularly, she feels more rested. It took almost a year since she knew she needed it.

## 2020-05-30 LAB — MAGNESIUM: Magnesium: 2.1 mg/dL (ref 1.5–2.5)

## 2020-05-30 LAB — HEMOGLOBIN A1C: Hgb A1c MFr Bld: 5.7 % (ref 4.6–6.5)

## 2020-05-30 LAB — COMPREHENSIVE METABOLIC PANEL
ALT: 38 U/L — ABNORMAL HIGH (ref 0–35)
AST: 31 U/L (ref 0–37)
Albumin: 4.3 g/dL (ref 3.5–5.2)
Alkaline Phosphatase: 81 U/L (ref 39–117)
BUN: 12 mg/dL (ref 6–23)
CO2: 30 mEq/L (ref 19–32)
Calcium: 9.7 mg/dL (ref 8.4–10.5)
Chloride: 103 mEq/L (ref 96–112)
Creatinine, Ser: 0.63 mg/dL (ref 0.40–1.20)
GFR: 97.83 mL/min (ref >60.00)
Glucose, Bld: 96 mg/dL (ref 70–99)
Potassium: 4.2 mEq/L (ref 3.5–5.1)
Sodium: 141 mEq/L (ref 135–145)
Total Bilirubin: 0.5 mg/dL (ref 0.2–1.2)
Total Protein: 6.6 g/dL (ref 6.0–8.3)

## 2020-05-30 LAB — CBC
HCT: 36.1 % (ref 36.0–46.0)
Hemoglobin: 12.3 g/dL (ref 12.0–15.0)
MCHC: 34 g/dL (ref 30.0–36.0)
MCV: 87.9 fl (ref 78.0–100.0)
Platelets: 261 10*3/uL (ref 150.0–400.0)
RBC: 4.11 Mil/uL (ref 3.87–5.11)
RDW: 13.2 % (ref 11.5–15.5)
WBC: 5.2 10*3/uL (ref 4.0–10.5)

## 2020-05-30 LAB — LIPID PANEL
Cholesterol: 183 mg/dL (ref 0–200)
HDL: 41.6 mg/dL (ref >39.00)
NonHDL: 141.27
Total CHOL/HDL Ratio: 4
Triglycerides: 239 mg/dL — ABNORMAL HIGH (ref 0.0–149.0)
VLDL: 47.8 mg/dL — ABNORMAL HIGH (ref 0.0–40.0)

## 2020-05-30 LAB — PARATHYROID HORMONE, INTACT (NO CA): PTH: 67 pg/mL (ref 16–77)

## 2020-05-30 LAB — LDL CHOLESTEROL, DIRECT: Direct LDL: 112 mg/dL

## 2020-05-30 LAB — TSH: TSH: 1.39 u[IU]/mL (ref 0.35–4.50)

## 2020-06-04 ENCOUNTER — Other Ambulatory Visit: Payer: Self-pay | Admitting: Family Medicine

## 2020-06-04 ENCOUNTER — Ambulatory Visit: Payer: No Typology Code available for payment source | Admitting: Adult Health

## 2020-06-04 NOTE — Progress Notes (Addendum)
Office Visit Note  Patient: Sandra [name]             Date of Birth: August 24, 1960           MRN: [id number]             PCP: [name], MD Referring: [name], MD Visit Date: 06/18/2020 Occupation: @GUAROCC @  Subjective:  Medication monitoring   History of Present Illness: Sandra [name] is a 60 y.o. female with history of seronegative rheumatoid arthritis and osteoarthritis.  She is on enbrel 50 mg sq injections once weekly.  After her last office visit she was diagnosed with gastroenteritis secondary to E. coli.  She held Enbrel for 1 week during that time her symptoms have completely resolved.  She has not missed any other doses of Enbrel recently.  She has not noticed any clinical improvement on Enbrel.  She has also had progressively worsening injection site reactions with the weekly Enbrel injections. She continues to have recurrent flares when she is not taking prednisone.  She is currently having pain in both hands, the left wrist, left knee, both feet, and both ankle joints.  The swelling in her hands have started to return since she has not been on prednisone since her last office visit.  She currently rates the pain in her ankle as a 8 out of 10.  She has tried applying Biofreeze topically without any improvement in her discomfort. She would like to discuss other treatment options.  She continues to have nocturnal pain and difficulty with ADLs due to the severity of pain, so she is not ready to return to work until her arthritis is better controlled.    Activities of Daily Living:  Patient reports morning stiffness for all day. Patient Reports nocturnal pain.  Difficulty dressing/grooming: Denies Difficulty climbing stairs: Denies Difficulty getting out of chair: Denies Difficulty using hands for taps, buttons, cutlery, and/or writing: Reports  Review of Systems  Constitutional: Positive for fatigue.  HENT: Positive for mouth dryness. Negative for mouth sores  and nose dryness.   Eyes: Positive for visual disturbance and dryness. Negative for photophobia, pain and itching.  Respiratory: Negative for shortness of breath and difficulty breathing.   Cardiovascular: Negative for chest pain and palpitations.  Gastrointestinal: Negative for blood in stool, constipation and diarrhea.  Endocrine: Negative for increased urination.  Genitourinary: Negative for difficulty urinating.  Musculoskeletal: Positive for arthralgias, joint pain, joint swelling and morning stiffness. Negative for myalgias, muscle tenderness and myalgias.  Skin: Positive for rash. Negative for color change and redness.  Allergic/Immunologic: Negative for susceptible to infections.  Neurological: Positive for numbness and weakness. Negative for dizziness, headaches and memory loss.  Hematological: Negative for bruising/bleeding tendency.  Psychiatric/Behavioral: Positive for sleep disturbance. Negative for confusion.    PMFS History:  Patient Active Problem List   Diagnosis Date Noted  . Back pain 12/13/2019  . Rheumatoid arthritis ([place]) 12/02/2019  . Anxiety and depression 08/01/2019  . Arthralgia 05/04/2019  . OSA (obstructive sleep apnea) 05/03/2019  . Thyroiditis 12/20/2018  . Muscle cramp 12/20/2018  . Eustachian tube dysfunction 07/27/2017  . Headache 11/25/2015  . Acute bacterial sinusitis 03/26/2015  . Hematuria 04/09/2014  . Hyperglycemia 04/06/2014  . Diarrhea 09/21/2013  . Hyperparathyroidism, primary ([place]) 07/12/2013  . Dyspnea 12/28/2012  . Hypercalcemia 12/16/2012  . H/O tobacco use, presenting hazards to health 11/29/2012  . Overactive bladder 05/31/2012  . Elevated LFTs 05/31/2012  . Obesity (BMI 30-39.9) 05/31/2012  .  Allergic rhinitis 05/31/2012  . Hoarseness 02/04/2012  . Preventative health care 02/04/2012  . SUI (stress urinary incontinence, female) 02/04/2012  . Migraine   . GERD (gastroesophageal reflux disease)   . Rotator cuff syndrome of  right shoulder 06/11/2011  . Atypical chest pain 01/03/2011  . Hypertension 01/03/2011  . Hyperlipidemia 01/03/2011  . KNEE PAIN, BILATERAL 09/28/2008  . ANSERINE BURSITIS 09/28/2008  . 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 Allergic rhinitis 05/31/2012  . Chest pain    PT SAW CARDIOLOGIST DR. Burt Knack - STRESS ECHO DONE NEGATIVE - NO PROBLEM SINCE  . Chicken pox as achild  . Cough 11/29/2012   RESOLVED - IT WAS CAUSED BY LISINOPRIL  . Elevated LFTs 05/31/2012  . Eustachian tube dysfunction 07/27/2017  . GERD (gastroesophageal reflux disease)   . H/O tobacco use, presenting hazards to health 11/29/2012   Smoked 1 1/2 ppd for 20 years quit in roughtly 2007  . Headache 11/25/2015  . Hematuria 04/09/2014  . Hoarseness 02/04/2012   RESOLVED - THOUGHT TO HAVE BEEN ALLERY RELATED  . Hyperglycemia 04/06/2014  . Hyperlipidemia   . Hyperparathyroidism ([place])   . Hypertension   . Obesity, unspecified 05/31/2012  . Ocular migraine   . Overactive bladder 05/31/2012  . Preventative health care 02/04/2012  . PTSD (post-traumatic stress disorder)   . Rapid heart beat    PT FEELS PROB RELATED TO HYPER PARATHYROIDISM  . Rheumatoid arthritis ([place])   . SUI (stress urinary incontinence, female) 02/04/2012   Sees Dr Perlie Gold and they have discussed a bladder tack but so far she declines    Family History  Problem Relation Age of Onset  . Hypertension Mother 8       alive  . Renal cancer Mother        renal  . Hyperlipidemia Mother   . Osteoporosis Mother        humerus   . Macular degeneration Mother   . Tremor Mother   . Parkinson's disease Mother   . Hypertension Father 79       alive  . Atrial fibrillation Father   . Colon cancer Father        colon  . Hyperlipidemia Father   . Stroke Father   . Alzheimer's disease Father   . Hypertension Brother 67       alive  . Tremor Brother   . Hypertension Sister   . Lung cancer Maternal  Grandfather        lung/ smoker  . Emphysema Maternal Grandfather   . Heart disease Paternal Grandmother   . Other Paternal Grandfather        black lung  . Heart disease Paternal Grandfather   . Asthma Son   . Eczema Son   . Healthy Son   . Healthy Son   . Heart disease Paternal Aunt   . Heart disease Paternal Uncle    Past Surgical History:  Procedure Laterality Date  . BREAST EXCISIONAL BIOPSY Left    Age 24 Fibroadenoma  . BREAST SURGERY     left breast excision of adenoma  . btl    . PARATHYROIDECTOMY Right 08/12/2013   Procedure: right inferior frozen section PARATHYROIDECTOMY;  Surgeon: Earnstine Regal, MD;  Location: [facility];  Service: General;  Laterality: Right;  . PELVIC LAPAROSCOPY  03-2004  . TOTAL SHOULDER REPLACEMENT Right 01/2019  . TUBAL LIGATION    . WISDOM TOOTH EXTRACTION  30 yrs ago   Social History   Social History Narrative  . Not on file   Immunization History  Administered Date(s) Administered  . Hepatitis A, Adult 09/04/2016, 03/27/2017  . Influenza Split 12/09/2011  . Influenza,inj,Quad PF,6+ Mos 11/29/2012, 11/01/2013, 11/20/2015, 12/29/2017, 04/03/2019, 12/13/2019  . PFIZER(Purple Top)SARS-COV-2 Vaccination 05/20/2019, 06/15/2019, 01/11/2020  . Pneumococcal Polysaccharide-23 08/01/2019  . Tdap 06/07/2015  . Zoster Recombinat (Shingrix) 08/01/2019     Objective: Vital Signs: BP (!) 143/86 (BP Location: Left Arm, Patient Position: Sitting, Cuff Size: Normal)   Pulse (!) 42   Resp 14   Ht 5\' 5"  (1.651 m)   Wt 193 lb 3.2 oz (87.6 kg)   LMP 04/01/2011   BMI 32.15 kg/m    Physical Exam Vitals and nursing note reviewed.  Constitutional:      Appearance: She is well-developed.  HENT:     Head: Normocephalic and atraumatic.  Eyes:     Conjunctiva/sclera: Conjunctivae normal.  Pulmonary:     Effort: Pulmonary effort is normal.  Abdominal:     Palpations: Abdomen is soft.  Musculoskeletal:     Cervical back: Normal range of motion.   Skin:    General: Skin is warm and dry.     Capillary Refill: Capillary refill takes less than 2 seconds.  Neurological:     Mental Status: She is alert and oriented to person, place, and time.  Psychiatric:        Behavior: Behavior normal.      Musculoskeletal Exam: C-spine, thoracic spine, and lumbar spine good ROM with no discomfort. Shoulder joints, elbow joints, wrist joints, MCPs, PIP, and DIPs good ROM with no synovitis.  Thickening and tenderness over the right 2nd and 3rd MCPs and left 1st, 2nd, and 3rd MCP joints.  Hip joints good ROM with no discomfort.  Limited extension and warmth of the left knee on exam.  Tenderness and warmth of both ankle joints.  Tenderness of all MTP joints.   CDAI Exam: CDAI Score: 9.4  Patient Global: 8 mm; Provider Global: 6 mm Swollen: 3 ; Tender: 19  Joint Exam 06/18/2020      Right  Left  Wrist      Tender  MCP 1      Tender  MCP 2   Tender   Tender  MCP 3   Tender   Tender  Knee     Swollen Tender  Ankle  Swollen Tender  Swollen Tender  MTP 1   Tender   Tender  MTP 2   Tender   Tender  MTP 3   Tender   Tender  MTP 4   Tender   Tender  MTP 5   Tender   Tender     Investigation: No additional findings.  Imaging: No results found.  Recent Labs: Lab Results  Component Value Date   WBC 5.2 05/29/2020   HGB 12.3 05/29/2020   PLT 261.0 05/29/2020   NA 141 05/29/2020   K 4.2 05/29/2020   CL 103 05/29/2020   CO2 30 05/29/2020   GLUCOSE 96 05/29/2020   BUN 12 05/29/2020   CREATININE 0.63 05/29/2020   BILITOT 0.5 05/29/2020   ALKPHOS 81 05/29/2020   AST 31 05/29/2020   ALT 38 (H) 05/29/2020   PROT 6.6 05/29/2020   ALBUMIN 4.3 05/29/2020   CALCIUM 9.7 05/29/2020   GFRAA 117 04/12/2020   QFTBGOLDPLUS NEGATIVE 07/26/2019    Speciality Comments: No specialty comments available.  Procedures:  No procedures performed  Allergies: Cefdinir, Doxycycline, Codeine, Penicillins, and Simvastatin   Assessment / Plan:     Visit  Diagnoses: Rheumatoid arthritis of multiple sites with negative rheumatoid factor ([place]) - (Discontinued Humira due to inadequate response (11/23/19-02/15/20)  Discontinued MTX due to increased joint pain): She presents today with ongoing pain and stiffness in multiple joints including the left wrist, both hands, left knee, both ankle joints, and all MTP joints.  She has warmth and painful range of motion of the left knee and both ankle joints on examination today.  She is currently on Enbrel 50 mg subcu injections once weekly and Arava 20 mg 1 tablet by mouth daily.  She missed 1 dose of Enbrel since her last office visit after being diagnosed with gastroenteritis secondary to E. coli.  She has noticed progressively worsening injection site reactions since starting on Enbrel which have become very bothersome.  She presents today to discuss other treatment options.  Indications, contraindications, potential side effects of Orencia were discussed today in detail and all questions were addressed.  Consent was obtained.  We will plan for Wednesday through her insurance and once approved she will return to the office for the administration of the first injection.  She was advised to discontinue Enbrel and to continue on [place] as prescribed. A refill of arava was sent to the pharmacy.  A prednisone taper starting at 20 mg tapering by 5 mg every 4 days with sent to the pharmacy.  We will extend her work leave until Jul 23, 2020 due to severity and frequency of flares she is experiencing.  She will return to the office on 07/20/2020 to assess her response to combination therapy and reevaluate if she is ready to return to work.  Medication counseling:  TB Gold: 07/26/19 Hepatitis panel: 07/26/19 HIV: negative 07/26/19 SPEP:07/26/19  Immunoglobulins: 07/26/19  Does patient have a diagnosis of COPD? No  Counseled patient that Maureen Chatters is a selective T-cell costimulation blocker indicated for RA.  Counseled patient on  purpose, proper use, and adverse effects of Orencia. The most common adverse effects are increased risk of infections, headache, and injection site reactions.  There is the possibility of an increased risk of malignancy but it is not well understood if this increased risk is due to the medication or the disease state.  Reviewed the importance of regular labs while on Orencia therapy.  Counseled patient that Maureen Chatters should be held prior to scheduled surgery.  Counseled patient to avoid live vaccines while on Orencia.  Advised patient to get annual influenza vaccine and the pneumococcal vaccine as indicated.  Provided patient with medication education material and answered all questions.  Patient consented to [facility].  Will upload consent into patient's chart.  Will apply for Orencia through patient's insurance.  Reviewed storage information for Orencia.  Advised initial injection must be administered in office.   High risk medication use -Applying for Orencia 125 mg sq injections once weekly. she will remain on Arava 20 mg 1 tablet by mouth daily.  Inadequate response to Humira and Enbrel. Her last dose of enbrel was on 06/15/20.  CBC and CMP updated on 05/18/20.  She will return for lab work in 1 month then every 3 months. Standing orders for CBC and CMP remain in place.  TB gold negative on 07/26/19. Future order for TB gold remains in place.  Discussed the importance of holding Orencia if she develops signs or symptoms of an infection and to resume once the infection has completely cleared.  She has received 3 Pfizer COVID-19 vaccine doses.  Osteopenia of multiple sites - DEXA updated on 08/12/19: The BMD measured at Forearm Radius 33% is 0.751 g/cm2 with a T-score of -1.4.  She is taking vitamin D 2000 units daily.  Status post total shoulder replacement, right: She has painful ROM of the right shoulder replacement.   Primary osteoarthritis of both hands: She has PIP and DIP prominence consistent with  osteoarthritis of both hands.  Tenderness over the left [facility] joint noted. Complete fist formation bilaterally.  She continues to have chronic pain and stiffness in both hands and the left wrist joint.   Primary osteoarthritis of both feet: She continues to have significant discomfort in both feet and both ankle joints.  She has tenderness and warmth over both ankles on examination today.  She currently rates her pain an 8 out of 10 in both ankles.  She has tried applying Biofreeze topically without any relief.  She has not taken prednisone since her last office visit.  A prednisone taper starting 20 mg tapering by 5 mg every 4 days will be sent to the pharmacy to bridge her until she is started on orencia.   Other medical conditions are listed as follows:   Hashimoto's thyroiditis  Essential hypertension  History of gastroesophageal reflux (GERD)  Hyperparathyroidism, primary ([place])  Hx of migraines  Anxiety and depression  History of miscarriage  History of hyperlipidemia  Former smoker  Orders: No orders of the defined types were placed in this encounter.  Meds ordered this encounter  Medications  . leflunomide (ARAVA) 20 MG tablet    Sig: Take 1 tablet (20 mg total) by mouth daily.    Dispense:  90 tablet    Refill:  0  . predniSONE (DELTASONE) 5 MG tablet    Sig: Take 4 tablets by mouth daily x4 days, 3 tablets by mouth daily x4 days, 2 tablets by mouth daily x4 days, 1 tablet by mouth x4 days.    Dispense:  40 tablet    Refill:  0     Follow-Up Instructions: Return for Rheumatoid arthritis.   Ofilia Neas, PA-C  Note - This record has been created using Dragon software.  Chart creation errors have been sought, but may not always  have been located. Such creation errors do not reflect on  the standard of medical care.

## 2020-06-18 ENCOUNTER — Ambulatory Visit (INDEPENDENT_AMBULATORY_CARE_PROVIDER_SITE_OTHER): Payer: No Typology Code available for payment source | Admitting: Physician Assistant

## 2020-06-18 ENCOUNTER — Other Ambulatory Visit: Payer: Self-pay

## 2020-06-18 ENCOUNTER — Encounter: Payer: Self-pay | Admitting: Physician Assistant

## 2020-06-18 ENCOUNTER — Telehealth: Payer: Self-pay | Admitting: Pharmacist

## 2020-06-18 VITALS — BP 143/86 | HR 42 | Resp 14 | Ht 65.0 in | Wt 193.2 lb

## 2020-06-18 DIAGNOSIS — Z87891 Personal history of nicotine dependence: Secondary | ICD-10-CM

## 2020-06-18 DIAGNOSIS — M19041 Primary osteoarthritis, right hand: Secondary | ICD-10-CM

## 2020-06-18 DIAGNOSIS — Z79899 Other long term (current) drug therapy: Secondary | ICD-10-CM | POA: Diagnosis not present

## 2020-06-18 DIAGNOSIS — Z8719 Personal history of other diseases of the digestive system: Secondary | ICD-10-CM

## 2020-06-18 DIAGNOSIS — E063 Autoimmune thyroiditis: Secondary | ICD-10-CM

## 2020-06-18 DIAGNOSIS — Z96611 Presence of right artificial shoulder joint: Secondary | ICD-10-CM

## 2020-06-18 DIAGNOSIS — F32A Depression, unspecified: Secondary | ICD-10-CM

## 2020-06-18 DIAGNOSIS — M0609 Rheumatoid arthritis without rheumatoid factor, multiple sites: Secondary | ICD-10-CM

## 2020-06-18 DIAGNOSIS — Z8639 Personal history of other endocrine, nutritional and metabolic disease: Secondary | ICD-10-CM

## 2020-06-18 DIAGNOSIS — I10 Essential (primary) hypertension: Secondary | ICD-10-CM

## 2020-06-18 DIAGNOSIS — Z8669 Personal history of other diseases of the nervous system and sense organs: Secondary | ICD-10-CM

## 2020-06-18 DIAGNOSIS — Z8759 Personal history of other complications of pregnancy, childbirth and the puerperium: Secondary | ICD-10-CM

## 2020-06-18 DIAGNOSIS — F419 Anxiety disorder, unspecified: Secondary | ICD-10-CM

## 2020-06-18 DIAGNOSIS — M19071 Primary osteoarthritis, right ankle and foot: Secondary | ICD-10-CM

## 2020-06-18 DIAGNOSIS — M19072 Primary osteoarthritis, left ankle and foot: Secondary | ICD-10-CM

## 2020-06-18 DIAGNOSIS — M8589 Other specified disorders of bone density and structure, multiple sites: Secondary | ICD-10-CM

## 2020-06-18 DIAGNOSIS — E210 Primary hyperparathyroidism: Secondary | ICD-10-CM

## 2020-06-18 DIAGNOSIS — M19042 Primary osteoarthritis, left hand: Secondary | ICD-10-CM

## 2020-06-18 MED ORDER — PREDNISONE 5 MG PO TABS: ORAL_TABLET | ORAL | 0 refills | Status: DC

## 2020-06-18 MED ORDER — LEFLUNOMIDE 20 MG PO TABS: 20.0000 mg | ORAL_TABLET | Freq: Every day | ORAL | 0 refills | Status: DC

## 2020-06-18 NOTE — Progress Notes (Signed)
Pharmacy Note  Subjective: Patient presents today to [facility] for follow up office visit.   Patient seen by the pharmacist for counseling on subcutaneous Orencia for rheumatoid arthritis.  Prior therapy includes: Enbrel, Humira.  Patient has skin reaction to Enbrel (appears as red rash that is not improved with cetirizine, ice packs, or steroid creams. Based on the reaction I saw, there is some improvement by week 2 post-injection but site is still itchy per patient  Objective: CBC    Component Value Date/Time   WBC 5.2 05/29/2020 1442   RBC 4.11 05/29/2020 1442   HGB 12.3 05/29/2020 1442   HCT 36.1 05/29/2020 1442   PLT 261.0 05/29/2020 1442   MCV 87.9 05/29/2020 1442   MCH 29.3 04/12/2020 1111   MCHC 34.0 05/29/2020 1442   RDW 13.2 05/29/2020 1442   LYMPHSABS 1,760 04/12/2020 1111   MONOABS 0.4 04/18/2015 0736   EOSABS 50 04/12/2020 1111   BASOSABS 32 04/12/2020 1111    CMP     Component Value Date/Time   NA 141 05/29/2020 1442   K 4.2 05/29/2020 1442   CL 103 05/29/2020 1442   CO2 30 05/29/2020 1442   GLUCOSE 96 05/29/2020 1442   BUN 12 05/29/2020 1442   CREATININE 0.63 05/29/2020 1442   CREATININE 0.59 04/12/2020 1111   CALCIUM 9.7 05/29/2020 1442   PROT 6.6 05/29/2020 1442   ALBUMIN 4.3 05/29/2020 1442   AST 31 05/29/2020 1442   ALT 38 (H) 05/29/2020 1442   ALKPHOS 81 05/29/2020 1442   BILITOT 0.5 05/29/2020 1442   GFRNONAA 101 04/12/2020 1111   GFRAA 117 04/12/2020 1111    Baseline Immunosuppressant Therapy Labs TB GOLD Quantiferon TB Gold Latest Ref Rng & Units 07/26/2019  Quantiferon TB Gold Plus NEGATIVE NEGATIVE   Hepatitis Panel Hepatitis Latest Ref Rng & Units 07/26/2019  Hep B Surface Ag NON-REACTI NON-REACTIVE  Hep B IgM NON-REACTI NON-REACTIVE  Hep C Ab NON-REACTI NON-REACTIVE  Hep C Ab NON-REACTI NON-REACTIVE  Hep A IgM NON REACTIVE -   HIV Lab Results  Component Value Date   HIV NON-REACTIVE 07/26/2019    Immunoglobulins Immunoglobulin Electrophoresis Latest Ref Rng & Units 07/26/2019  IgA  47 - 310 mg/dL 117  IgG 600 - 1,640 mg/dL 1,098  IgM 50 - 300 mg/dL 148   SPEP Serum Protein Electrophoresis Latest Ref Rng & Units 05/29/2020  Total Protein 6.0 - 8.3 g/dL 6.6  Albumin 3.8 - 4.8 g/dL -  Alpha-1 0.2 - 0.3 g/dL -  Alpha-2 0.5 - 0.9 g/dL -  Beta Globulin 0.4 - 0.6 g/dL -  Beta 2 0.2 - 0.5 g/dL -  Gamma Globulin 0.8 - 1.7 g/dL -   Chest x-ray: 07/26/19 wnl  Does patient have a diagnosis of COPD? No  Does patient have history of diverticulitis?  No  Assessment/Plan:  Counseled patient that Maureen Chatters is a selective T-cell costimulation blocker.  Counseled patient on purpose, proper use, and adverse effects of Orencia. The most common adverse effects are increased risk of infections, headache, and injection site reactions.  There is the possibility of an increased risk of malignancy but it is not well understood if this increased risk is due to the medication or the disease state. Reviewed risk of GI perforation which is higher in patients with diverticulitis and diabetes.  Counseled patient that Maureen Chatters should be held prior to scheduled surgery.  Counseled patient to avoid live vaccines while on Orencia.  Recommend annual influenza, Pneumovax 23, Prevnar 13,  and Shingrix as indicated.   Reviewed the importance of regular labs while on Orencia therapy. Patient will be due for labs 1 month after starting therapy. Standing orders placed. Provided patient with medication education material and answered all questions.  Patient consented to [facility].  Will upload consent into patient's chart.  Will apply for Orencia through patient's insurance.  Reviewed storage information for Orencia.  Advised initial injection must be administered in office.    Patient dose will be Orencia 125 mg every 7 days.  Prescription pending lab results and/or insurance approval. She will continue leflunomide 20 mg once  daily  Knox Saliva, PharmD, MPH Clinical Pharmacist (Rheumatology and Pulmonology)

## 2020-06-18 NOTE — Telephone Encounter (Signed)
Please start Orencia SQ BIV.  Dose: 125 mg every 7 days  Dx: RA (M06.09)  Previously tried therapies: . Humira (Inadequate response, 11/23/19-02/15/20) . Enbrel (Inadequate response and injection site reaction; started 03/15/20 to current) . Methotrexate (increased joint pain)  Current regimen: Enbrel + leflunomide  Knox Saliva, PharmD, MPH Clinical Pharmacist (Rheumatology and Pulmonology)

## 2020-06-18 NOTE — Patient Instructions (Signed)
Standing Labs We placed an order today for your standing lab work.   Please have your standing labs drawn in 1 month then every 3 months   If possible, please have your labs drawn 2 weeks prior to your appointment so that the provider can discuss your results at your appointment.  We have open lab daily Monday through Thursday from 1:30-4:30 PM and Friday from 1:30-4:00 PM at the office of Dr. Bo Merino, [facility].   Please be advised, all patients with office appointments requiring lab work will take precedents over walk-in lab work.  If possible, please come for your lab work on Monday and Friday afternoons, as you may experience shorter wait times. The office is located at [address] [place], [place], [place] [address] No appointment is necessary.   Labs are drawn by [facility]. Please bring your co-pay at the time of your lab draw.  You may receive a bill from [facility] for your lab work.  If you wish to have your labs drawn at another location, please call the office 24 hours in advance to send orders.  If you have any questions regarding directions or hours of operation,  please call [phone].   As a reminder, please drink plenty of water prior to coming for your lab work. Thanks!    Abatacept solution for injection (subcutaneous or intravenous use) What is this medicine? ABATACEPT (a ba TA sept) is used to treat moderate to severe active rheumatoid arthritis or psoriatic arthritis in adults. This medicine is also used to treat juvenile idiopathic arthritis. This medicine may be used for other purposes; ask your health care provider or pharmacist if you have questions. COMMON BRAND NAME(S): Orencia What should I tell my health care provider before I take this medicine? They need to know if you have any of these conditions:  cancer  diabetes  hepatitis B or history of hepatitis B infection  immune system problems  infection or history of infection  (especially a virus infection such as chickenpox, cold sores, or herpes)  lung or breathing problems, like chronic obstructive pulmonary disease (COPD)  recently received or scheduled to receive a vaccination  scheduled to have surgery  tuberculosis, a positive skin test for tuberculosis, or have recently been in close contact with someone who has tuberculosis  an unusual or allergic reaction to abatacept, other medicines, foods, dyes, or preservatives  pregnant or trying to get pregnant  breast-feeding How should I use this medicine? This medicine is for infusion into a vein or for injection under the skin. Infusions are given by a health care professional in a hospital or clinic setting. If you are to give your own medicine at home, you will be taught how to prepare and give this medicine under the skin. Use exactly as directed. Take your medicine at regular intervals. Do not take your medicine more often than directed. It is important that you put your used needles and syringes in a special sharps container. Do not put them in a trash can. If you do not have a sharps container, call your pharmacist or health care provider to get one. Talk to your pediatrician regarding the use of this medicine in children. While infusions in a clinic may be prescribed for children as young as 2 years for selected conditions, precautions do apply. Overdosage: If you think you have taken too much of this medicine contact a poison control center or emergency room at once. NOTE: This medicine is only for  you. Do not share this medicine with others. What if I miss a dose? This medicine is used once a week if given by injection under the skin. If you miss a dose, take it as soon as you can. If it is almost time for your next dose, take only that dose. Do not take double or extra doses. If you are to be given an infusion of this medicine, it is important not to miss your dose. Doses are usually every 4 weeks. Call  your doctor or health care professional if you are unable to keep an appointment. What may interact with this medicine? Do not take this medicine with any of the following medications:  live vaccines This medicine may also interact with the following medications:  anakinra  baricitinib  canakinumab  medicines that lower your chance of fighting an infection  rituximab  TNF blockers such as adalimumab, certolizumab, etanercept, golimumab, infliximab  tocilizumab  tofacitinib  upadacitinib  ustekinumab This list may not describe all possible interactions. Give your health care provider a list of all the medicines, herbs, non-prescription drugs, or dietary supplements you use. Also tell them if you smoke, drink alcohol, or use illegal drugs. Some items may interact with your medicine. What should I watch for while using this medicine? Visit your doctor for regular checks on your progress. Tell your doctor or health care professional if your symptoms do not start to get better or if they get worse. You will be tested for tuberculosis (TB) before you start this medicine. If your doctor prescribed any medicine for TB, you should start taking the TB medicine before starting this medicine. Make sure to finish the full course of TB medicine. This medicine may increase your risk of getting an infection. Call your doctor or health care professional if you get fever, chills, or sore throat, or other symptoms of a cold or flu. Do not treat yourself. Try to avoid being around people who are sick. If you have diabetes and are getting this medicine in a vein, the infusion can give false high blood sugar readings on the day of your dose. This may happen if you use certain types of blood glucose tests. Your health care provider may tell you to use a different way to monitor your blood sugar levels. What side effects may I notice from receiving this medicine? Side effects that you should report to your  doctor or health care professional as soon as possible:  allergic reactions like skin rash, itching or hives, swelling of the face, lips, or tongue  breathing problems  chest pain  dizziness  signs and symptoms of infection like fever; chills; cough; sore throat; pain or trouble passing urine  unusually weak or tired Side effects that usually do not require medical attention (report to your doctor or health care professional if they continue or are bothersome):  diarrhea  headache  nausea  pain, redness, or irritation at site where injected  stomach pain or upset This list may not describe all possible side effects. Call your doctor for medical advice about side effects. You may report side effects to FDA at 1-800-FDA-1088. Where should I keep my medicine? Infusions will be given in a hospital or clinic and will not be stored at home. Storage for syringes and autoinjectors stored at home: Keep out of the reach of children. Store in a refrigerator between 2 and 8 degrees C (36 and 46 degrees F). Keep this medicine in the original container. Protect from  light. Do not freeze. Do not shake. Throw away any unused medicine after the expiration date. NOTE: This sheet is a summary. It may not cover all possible information. If you have questions about this medicine, talk to your doctor, pharmacist, or health care provider.  2021 Elsevier/Gold Standard (2018-08-31 14:01:21)

## 2020-06-18 NOTE — Telephone Encounter (Signed)
Submitted a Prior Authorization request to [facility] for [facility] via Cover My Meds. Will update once we receive a response.   KeyVilma Meckel - PA Case ID: [id number]

## 2020-06-19 ENCOUNTER — Other Ambulatory Visit (HOSPITAL_COMMUNITY): Payer: Self-pay

## 2020-06-19 NOTE — Telephone Encounter (Addendum)
Received notification from [facility] regarding a prior authorization for [facility] 125mg  SQ. Authorization has been APPROVED from 06/18/20 to 06/18/21.   Phone # [phone]  Must fill through [facility]. Unable to run test claim. Attempted to enroll patient for Orencia copay card, but patient needs to speak with counselor to fully enroll. Notified patient via [facility].  Patient scheduled for Orencia new start on 06/26/20 @ 9 am.  Knox Saliva, PharmD, MPH Clinical Pharmacist (Rheumatology and Pulmonology)

## 2020-06-21 ENCOUNTER — Other Ambulatory Visit: Payer: Self-pay | Admitting: Family Medicine

## 2020-06-26 ENCOUNTER — Other Ambulatory Visit: Payer: Self-pay

## 2020-06-26 ENCOUNTER — Ambulatory Visit: Payer: No Typology Code available for payment source | Admitting: Pharmacist

## 2020-06-26 DIAGNOSIS — Z7189 Other specified counseling: Secondary | ICD-10-CM

## 2020-06-26 DIAGNOSIS — M0609 Rheumatoid arthritis without rheumatoid factor, multiple sites: Secondary | ICD-10-CM

## 2020-06-26 MED ORDER — ORENCIA CLICKJECT 125 MG/ML ~~LOC~~ SOAJ: 125.0000 mg | SUBCUTANEOUS | 0 refills | Status: DC

## 2020-06-26 NOTE — Patient Instructions (Addendum)
Your Orencia dose is one pen every 7 days. YOU MUST TRANSPORT IT IN A COOLER.  Prescription for Orencia was sent to [facility].  Their phone number [phone]   Please ensure that they are using your copay card information  Remember the 5 C's:  COUNTER - leave on the counter at least 30 minutes but up to overnight to bring medication to room temperature. This may help prevent stinging  COLD - place something cold (like an ice gel pack or cold water bottle) on the injection site just before cleansing with alcohol. This may help reduce pain  CLARITIN - use Claritin (generic name is loratadine) for the first two weeks of treatment or the day of, the day before, and the day after injecting. This will help to minimize injection site reactions  CORTISONE CREAM - apply if injection site is irritated and itching  CALL ME - if injection site reaction is bigger than the size of your fist, looks infected, blisters, or if you develop hives

## 2020-06-26 NOTE — Progress Notes (Signed)
Pharmacy Note  Subjective:   Mrs. Lamica presents to clinic today to receive first dose of Orencia auto-injector. Her last Enbrel dose was on 06/15/20 and was due last Friday but held this dose in anticipation of starting Orencia.  She feShe states itchiness from injections has improvement and hasn't needed to take diphenhydramine/loratidine since OV. States redness and irritation at Enbrel injection sites has improved and she is allowing it to improve on its own.  No history of COPD.  She continues to go back and forth from [facility] for her son's medical care, but is planning on returning to work on 07/23/20.  Patient has brought paperwork for Dr. Estanislado Pandy to sign for work (paperwork was signed last week but used stamped signature and needs wet signature)  Patient running a fever or have signs/symptoms of infection? No  Patient currently on antibiotics for the treatment of infection? No  Patient have any upcoming invasive procedures/surgeries? No  Objective: CMP     Component Value Date/Time   NA 141 05/29/2020 1442   K 4.2 05/29/2020 1442   CL 103 05/29/2020 1442   CO2 30 05/29/2020 1442   GLUCOSE 96 05/29/2020 1442   BUN 12 05/29/2020 1442   CREATININE 0.63 05/29/2020 1442   CREATININE 0.59 04/12/2020 1111   CALCIUM 9.7 05/29/2020 1442   PROT 6.6 05/29/2020 1442   ALBUMIN 4.3 05/29/2020 1442   AST 31 05/29/2020 1442   ALT 38 (H) 05/29/2020 1442   ALKPHOS 81 05/29/2020 1442   BILITOT 0.5 05/29/2020 1442   GFRNONAA 101 04/12/2020 1111   GFRAA 117 04/12/2020 1111    CBC    Component Value Date/Time   WBC 5.2 05/29/2020 1442   RBC 4.11 05/29/2020 1442   HGB 12.3 05/29/2020 1442   HCT 36.1 05/29/2020 1442   PLT 261.0 05/29/2020 1442   MCV 87.9 05/29/2020 1442   MCH 29.3 04/12/2020 1111   MCHC 34.0 05/29/2020 1442   RDW 13.2 05/29/2020 1442   LYMPHSABS 1,760 04/12/2020 1111   MONOABS 0.4 04/18/2015 0736   EOSABS 50 04/12/2020 1111   BASOSABS 32 04/12/2020 1111     Baseline Immunosuppressant Therapy Labs TB GOLD Quantiferon TB Gold Latest Ref Rng & Units 07/26/2019  Quantiferon TB Gold Plus NEGATIVE NEGATIVE   Hepatitis Panel Hepatitis Latest Ref Rng & Units 07/26/2019  Hep B Surface Ag NON-REACTI NON-REACTIVE  Hep B IgM NON-REACTI NON-REACTIVE  Hep C Ab NON-REACTI NON-REACTIVE  Hep C Ab NON-REACTI NON-REACTIVE  Hep A IgM NON REACTIVE -   HIV Lab Results  Component Value Date   HIV NON-REACTIVE 07/26/2019   Immunoglobulins Immunoglobulin Electrophoresis Latest Ref Rng & Units 07/26/2019  IgA  47 - 310 mg/dL 117  IgG 600 - 1,640 mg/dL 1,098  IgM 50 - 300 mg/dL 148   SPEP Serum Protein Electrophoresis Latest Ref Rng & Units 05/29/2020  Total Protein 6.0 - 8.3 g/dL 6.6  Albumin 3.8 - 4.8 g/dL -  Alpha-1 0.2 - 0.3 g/dL -  Alpha-2 0.5 - 0.9 g/dL -  Beta Globulin 0.4 - 0.6 g/dL -  Beta 2 0.2 - 0.5 g/dL -  Gamma Globulin 0.8 - 1.7 g/dL -   Chest x-ray: 07/26/19 wnl  Assessment/Plan:  Injection site reactions including redness has improved since OV on 06/18/20.  Counseled patient that Maureen Chatters is a selective T-cell costimulation blocker.  Counseled patient on purpose, proper use, and adverse effects of Orencia. The most common adverse effects are increased risk of infections, headache, and injection  site reactions or infusion reactions. Reviewed risk of GI perforation which is higher in patients with diverticulitis and diabetes. There is the possibility of an increased risk of malignancy but it is not well understood if this increased risk is due to the medication or the disease state.  Counseled patient that Maureen Chatters should be held prior to scheduled surgery. We discussed holding Orencia with signs/symptoms/confirmation of infection, use of antibiotics, and with any surgery.  We discussed Orencia storage which requires a cooler to transport it. Advised her to reach out to [facility] to request free cooler for transporting.  Demonstrated  proper injection technique with Orencia demo device  Patient able to demonstrate proper injection technique using the teach back method.  Patient self injected in the right lower abdomen (at least 2 inches away from site reactions) with:  Sample Medication: Orencia Clickject 125mg /mL NDC: [address]-84 Lot: [id number] Expiration: 06/2021  Patient tolerated well.  Observed for 30 mins in office for adverse reaction, and no injection site reaction noted.   Patient is to return in 1 month for labs and f/u appointment scheduled for 07/20/20. We can plan to draw repeat labs at that appointment. Standing orders for CBC and CMP remain in place.  She will continue Orencia 125mg  subcut every 7 days. She will continue leflunomide 20mg  once daily.  Orencia approved through insurance.  Prescription [facility] to [facility] with copay card information. Patient was previously using [facility] for Enbrel. We were able to sign up for copay card and virtual debit card together on the phone and printed copy of both for patient to take home. Discussed using copay card first and then using debit card with any remaining copay.  She will plan to [facility] today to schedule shipment of Orencia to her home.  Copay card information: ID - [id number] PCN Liam Graham - [id number] Group [id number]  Paperwork signed by Dr. Estanislado Pandy today and will be faxed again by Francis Gaines.  All questions encouraged and answered.  Instructed patient to call with any further questions or concerns.  Knox Saliva, PharmD, MPH Clinical Pharmacist (Rheumatology and Pulmonology)

## 2020-07-06 NOTE — Progress Notes (Signed)
Office Visit Note  Patient: Sandra [name]             Date of Birth: Jun 08, 1960           MRN: [id number]             PCP: [name], MD Referring: [name], MD Visit Date: 07/20/2020 Occupation: @GUAROCC @  Subjective:  Medication monitoring   History of Present Illness: Sandra [name] is a 60 y.o. female with history of seronegative rheumatoid arthritis and osteoarthritis.  She is prescribed Orencia 125 mg subcutaneous injections once weekly and Arava 20 mg 1 tablet by mouth daily.  She was started on Orencia on 06/26/2020.  She has been holding orencia and arava for the past 2 weeks due to being diagnosed with a dental abscess.  She has been taking clindamycin as prescribed and will be completing the prescription tomorrow.  Her symptoms have gradually been improving. Patient reports that she initially started to notice improvement with the first 2 Orencia injections.  She states that over the past 1 week she has started to have increased pain and swelling in multiple joints while off of therapy.  She is currently having pain and swelling in the left knee joint as well as pain in several joints in both feet.  She has also noticed increased pain and swelling in bilateral second and third MCP and PIP joints of both hands.  She has some tenderness over the right elbow.     Activities of Daily Living:  Patient reports morning stiffness for 2 hours.   Patient Reports nocturnal pain.  Difficulty dressing/grooming: Denies Difficulty climbing stairs: Denies Difficulty getting out of chair: Denies Difficulty using hands for taps, buttons, cutlery, and/or writing: Reports  Review of Systems  Constitutional: Positive for fatigue.  HENT: Positive for mouth dryness. Negative for mouth sores and nose dryness.   Eyes: Negative for pain, visual disturbance and dryness.  Respiratory: Negative for cough, hemoptysis, shortness of breath and difficulty breathing.   Cardiovascular:  Negative for chest pain, palpitations, hypertension and swelling in legs/feet.  Gastrointestinal: Negative for blood in stool, constipation and diarrhea.  Endocrine: Positive for heat intolerance. Negative for increased urination.  Genitourinary: Negative for difficulty urinating and painful urination.  Musculoskeletal: Positive for arthralgias, joint pain, joint swelling, muscle weakness and morning stiffness. Negative for myalgias, muscle tenderness and myalgias.  Skin: Negative for color change, pallor, rash, hair loss, nodules/bumps, skin tightness, ulcers and sensitivity to sunlight.  Allergic/Immunologic: Negative for susceptible to infections.  Neurological: Positive for weakness. Negative for dizziness, numbness and headaches.  Hematological: Positive for bruising/bleeding tendency. Negative for swollen glands.  Psychiatric/Behavioral: Negative for depressed mood and sleep disturbance. The patient is not nervous/anxious.     PMFS History:  Patient Active Problem List   Diagnosis Date Noted  . Back pain 12/13/2019  . Rheumatoid arthritis ([place]) 12/02/2019  . Anxiety and depression 08/01/2019  . Arthralgia 05/04/2019  . OSA (obstructive sleep apnea) 05/03/2019  . Thyroiditis 12/20/2018  . Muscle cramp 12/20/2018  . Eustachian tube dysfunction 07/27/2017  . Headache 11/25/2015  . Acute bacterial sinusitis 03/26/2015  . Hematuria 04/09/2014  . Hyperglycemia 04/06/2014  . Diarrhea 09/21/2013  . Hyperparathyroidism, primary ([place]) 07/12/2013  . Dyspnea 12/28/2012  . Hypercalcemia 12/16/2012  . H/O tobacco use, presenting hazards to health 11/29/2012  . Overactive bladder 05/31/2012  . Elevated LFTs 05/31/2012  . Obesity (BMI 30-39.9) 05/31/2012  . Allergic rhinitis 05/31/2012  . Hoarseness 02/04/2012  .  Preventative health care 02/04/2012  . SUI (stress urinary incontinence, female) 02/04/2012  . Migraine   . GERD (gastroesophageal reflux disease)   . Rotator cuff syndrome of  right shoulder 06/11/2011  . Atypical chest pain 01/03/2011  . Hypertension 01/03/2011  . Hyperlipidemia 01/03/2011  . KNEE PAIN, BILATERAL 09/28/2008  . ANSERINE BURSITIS 09/28/2008  . 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 Allergic rhinitis 05/31/2012  . Chest pain    PT SAW CARDIOLOGIST DR. Burt Knack - STRESS ECHO DONE NEGATIVE - NO PROBLEM SINCE  . Chicken pox as achild  . Cough 11/29/2012   RESOLVED - IT WAS CAUSED BY LISINOPRIL  . Elevated LFTs 05/31/2012  . Eustachian tube dysfunction 07/27/2017  . GERD (gastroesophageal reflux disease)   . H/O tobacco use, presenting hazards to health 11/29/2012   Smoked 1 1/2 ppd for 20 years quit in roughtly 2007  . Headache 11/25/2015  . Hematuria 04/09/2014  . Hoarseness 02/04/2012   RESOLVED - THOUGHT TO HAVE BEEN ALLERY RELATED  . Hyperglycemia 04/06/2014  . Hyperlipidemia   . Hyperparathyroidism ([place])   . Hypertension   . Obesity, unspecified 05/31/2012  . Ocular migraine   . Overactive bladder 05/31/2012  . Preventative health care 02/04/2012  . PTSD (post-traumatic stress disorder)   . Rapid heart beat    PT FEELS PROB RELATED TO HYPER PARATHYROIDISM  . Rheumatoid arthritis ([place])   . SUI (stress urinary incontinence, female) 02/04/2012   Sees Dr Perlie Gold and they have discussed a bladder tack but so far she declines    Family History  Problem Relation Age of Onset  . Hypertension Mother 79       alive  . Renal cancer Mother        renal  . Hyperlipidemia Mother   . Osteoporosis Mother        humerus   . Macular degeneration Mother   . Tremor Mother   . Parkinson's disease Mother   . Hypertension Father 83       alive  . Atrial fibrillation Father   . Colon cancer Father        colon  . Hyperlipidemia Father   . Stroke Father   . Alzheimer's disease Father   . Hypertension Brother 68       alive  . Tremor Brother   . Hypertension Sister   . Lung cancer Maternal  Grandfather        lung/ smoker  . Emphysema Maternal Grandfather   . Heart disease Paternal Grandmother   . Other Paternal Grandfather        black lung  . Heart disease Paternal Grandfather   . Asthma Son   . Eczema Son   . Healthy Son   . Healthy Son   . Heart disease Paternal Aunt   . Heart disease Paternal Uncle    Past Surgical History:  Procedure Laterality Date  . BREAST EXCISIONAL BIOPSY Left    Age 52 Fibroadenoma  . BREAST SURGERY     left breast excision of adenoma  . btl    . PARATHYROIDECTOMY Right 08/12/2013   Procedure: right inferior frozen section PARATHYROIDECTOMY;  Surgeon: Earnstine Regal, MD;  Location: [facility];  Service: General;  Laterality: Right;  . PELVIC LAPAROSCOPY  03-2004  . TOTAL SHOULDER REPLACEMENT Right 01/2019  . TUBAL LIGATION    . WISDOM TOOTH EXTRACTION  30 yrs ago   Social History  Social History Narrative  . Not on file   Immunization History  Administered Date(s) Administered  . Hepatitis A, Adult 09/04/2016, 03/27/2017  . Influenza Split 12/09/2011  . Influenza,inj,Quad PF,6+ Mos 11/29/2012, 11/01/2013, 11/20/2015, 12/29/2017, 04/03/2019, 12/13/2019  . PFIZER(Purple Top)SARS-COV-2 Vaccination 05/20/2019, 06/15/2019, 01/11/2020  . Pneumococcal Polysaccharide-23 08/01/2019  . Tdap 06/07/2015  . Zoster Recombinat (Shingrix) 08/01/2019     Objective: Vital Signs: BP 137/80 (BP Location: Left Arm, Patient Position: Sitting, Cuff Size: Normal)   Pulse 80   Resp 16   Ht 5\' 5"  (1.651 m)   Wt 190 lb (86.2 kg)   LMP 04/01/2011   BMI 31.62 kg/m    Physical Exam Vitals and nursing note reviewed.  Constitutional:      Appearance: She is well-developed.  HENT:     Head: Normocephalic and atraumatic.  Eyes:     Conjunctiva/sclera: Conjunctivae normal.  Pulmonary:     Effort: Pulmonary effort is normal.  Abdominal:     Palpations: Abdomen is soft.  Musculoskeletal:     Cervical back: Normal range of motion.  Lymphadenopathy:      Cervical: No cervical adenopathy.  Skin:    General: Skin is warm and dry.     Capillary Refill: Capillary refill takes less than 2 seconds.  Neurological:     Mental Status: She is alert and oriented to person, place, and time.  Psychiatric:        Behavior: Behavior normal.      Musculoskeletal Exam: C-spine, thoracic spine, and lumbar spine good ROM.  Right shoulder replacement has good ROM.  Left shoulder joints good ROM with no discomfort. Tenderness over the right elbow joint.  Wrist joints good ROM with no disocmfort.  Tenderness and synovitis over the right 2nd and 3rd MCP joints. Tenderness over the bilateral 2nd and 3rd PIPs and left 2nd and 3rd MCP joints.  Tenderness and synovitis over the left 5th PIP joint.  Hip joints good ROM.  Left knee has painful ROM with warmth and swelling.  Tenderness and warmth over the right ankle joint.  Tenderness over the left 2nd and 3rd MTPs and synovitis over the right 2nd MTPs.   CDAI Exam: CDAI Score: 16  Patient Global: 5 mm; Provider Global: 5 mm Swollen: 6 ; Tender: 16  Joint Exam 07/20/2020      Right  Left  Elbow   Tender     MCP 2  Swollen Tender   Tender  MCP 3  Swollen Tender   Tender  PIP 2   Tender   Tender  PIP 3   Tender   Tender  PIP 5     Swollen Tender  Knee     Swollen Tender  Ankle  Swollen Tender   Tender  MTP 2  Swollen Tender   Tender  MTP 3      Tender     Investigation: No additional findings.  Imaging: No results found.  Recent Labs: Lab Results  Component Value Date   WBC 5.2 05/29/2020   HGB 12.3 05/29/2020   PLT 261.0 05/29/2020   NA 141 05/29/2020   K 4.2 05/29/2020   CL 103 05/29/2020   CO2 30 05/29/2020   GLUCOSE 96 05/29/2020   BUN 12 05/29/2020   CREATININE 0.63 05/29/2020   BILITOT 0.5 05/29/2020   ALKPHOS 81 05/29/2020   AST 31 05/29/2020   ALT 38 (H) 05/29/2020   PROT 6.6 05/29/2020   ALBUMIN 4.3 05/29/2020   CALCIUM 9.7 05/29/2020  GFRAA 117 04/12/2020   QFTBGOLDPLUS  NEGATIVE 07/26/2019    Speciality Comments: No specialty comments available.  Procedures:  No procedures performed Allergies: Cefdinir, Doxycycline, Codeine, Penicillins, Simvastatin, and Other   Assessment / Plan:     Visit Diagnoses: Rheumatoid arthritis of multiple sites with negative rheumatoid factor ([place]) - (Discontinued Humira due to inadequate response (11/23/19-02/15/20)  Discontinued MTX due to increased joint pain): She has joint tenderness and synovitis in multiple joints as described above.  She presents today with pain in the right knee joint with warmth and swelling on examination.  She declined a right knee joint cortisone injection today.  She has been experiencing instability in both ankle joints and has ongoing tenderness and warmth in the right ankle.  She continues to have pain and stiffness in both hands and both feet.  She was started on Orencia 125 mg subcu days injections once weekly on 06/26/2020.  According to the patient she tolerated Orencia without any injection site reactions.  She started to notice some clinical improvement on combination therapy but she has been holding [place] for the past 2 weeks due to currently taking clindamycin for management of an abscessed tooth.  She will be completing the prescription tomorrow and plans to resume her medications once cleared by her dentist.  She is been experiencing significant discomfort in multiple joints over the past 1 week since being out of her medications a prednisone taper starting 20 mg tapering by 5 mg every 4 days was sent to the pharmacy today.  She is not having difficulty performing ADLs and walking long distances due to severity of pain and stiffness over the past 1 week.  We discussed that once she gets back on [place] we would like to reassess how she is doing in about 6 weeks.  She plans to try to return to work on 09/03/20.  She will follow-up on 08/31/2020 to reassess when she may be able to  return to work.  High risk medication use - Orencia 125 mg sq injections once weekly and Arava 20 mg 1 tablet by mouth daily. Inadequate response to Humira and Enbrel.  CBC and CMP were drawn on 05/29/2020.  Since she has been off of [place] for the past 2 weeks she would like to return for lab work in 2 to 3 weeks.  Standing orders for CBC and CMP are in place.  TB gold was negative on 07/26/2019.  Future order for TB gold released today. - Plan: CBC with Differential/Platelet, COMPLETE METABOLIC PANEL WITH GFR, QuantiFERON-TB Gold Plus She is currently holding Orencia and Arava due to being treated with clindamycin for a dental abscess.  She was advised to continue to hold both medications until the infection has completely cleared. She has received 3 Pfizer COVID-19 vaccine doses.  Screening for tuberculosis - Future order for TB gold placed today. Plan: QuantiFERON-TB Gold Plus  Osteopenia of multiple sites - DEXA updated on 08/12/19: The BMD measured at Forearm Radius 33% is 0.751 g/cm2 with a T-score of -1.4.  She is taking vitamin D 4,000 units daily.  Status post total shoulder replacement, right: Doing well.  She has good ROM with no tenderness at this time.  Primary osteoarthritis of both hands: PIP and DIP thickening consistent with osteoarthritis of both hands.   Primary osteoarthritis of both feet: She has PIP and DIP thickening consistent with osteoarthritis of both feet.  Tenderness and synovitis over the right 2nd MTPs and  tenderness over the left 2nd and 3rd MTP joints.   Other medical conditions are listed as follows:  Hashimoto's thyroiditis  Hyperparathyroidism, primary ([place])  Essential hypertension  History of gastroesophageal reflux (GERD)  Anxiety and depression  Hx of migraines  History of hyperlipidemia  History of miscarriage  Former smoker    Orders: Orders Placed This Encounter  Procedures  . CBC with Differential/Platelet  . COMPLETE  METABOLIC PANEL WITH GFR  . QuantiFERON-TB Gold Plus   Meds ordered this encounter  Medications  . predniSONE (DELTASONE) 5 MG tablet    Sig: Take 4 tablets by mouth daily x4 days, 3 tablets by mouth daily x4 days, 2 tablets by mouth daily x4 days, 1 tablet by mouth x4 days.    Dispense:  40 tablet    Refill:  0     Follow-Up Instructions: Return in about 6 weeks (around 08/31/2020) for Rheumatoid arthritis, Osteoarthritis.   Ofilia Neas, PA-C  Note - This record has been created using Dragon software.  Chart creation errors have been sought, but may not always  have been located. Such creation errors do not reflect on  the standard of medical care.

## 2020-07-08 HISTORY — PX: COLONOSCOPY: SHX174

## 2020-07-13 ENCOUNTER — Encounter: Payer: Self-pay | Admitting: Pulmonary Disease

## 2020-07-13 ENCOUNTER — Other Ambulatory Visit: Payer: Self-pay

## 2020-07-13 ENCOUNTER — Ambulatory Visit (INDEPENDENT_AMBULATORY_CARE_PROVIDER_SITE_OTHER): Payer: No Typology Code available for payment source | Admitting: Pulmonary Disease

## 2020-07-13 DIAGNOSIS — G4733 Obstructive sleep apnea (adult) (pediatric): Secondary | ICD-10-CM

## 2020-07-13 DIAGNOSIS — R0602 Shortness of breath: Secondary | ICD-10-CM

## 2020-07-13 NOTE — Assessment & Plan Note (Signed)
Previous evaluation showed no evidence of airway obstruction and imaging has been negative for ILD. Rheumatoid arthritis is now better controlled and stressors have decreased, curious to see if her dyspnea resolves

## 2020-07-13 NOTE — Progress Notes (Signed)
   Subjective:    Patient ID: Sandra [name], female    DOB: 1960/07/11, 60 y.o.   MRN: [id number]  HPI  60 yo ex-smoker for FU of shortness of breath and OSA.  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  TIA presenting as hemianopia -migraine  She finally received her CPAP machine and is started on this for a few weeks.  She has settled down with AirFit F30 fullface mask she is generally sleeping better and feels better rested. -She has dropped 16 pounds over the last 9 months to her current weight of 193 pounds and hopes to lose more. She just received a prednisone taper for flareup of rheumatoid arthritis, she has switched to Orencia from Enbrel.  She remains on [place] which she is currently stopped because she was awaiting oral surgery.  Shortness of breath persists Her 101 year old son was treated for sarcoma and unfortunately developed leukemia but is now in remission after induction therapy .  Stressors are slightly improved   Significant tests/ events reviewed 05/2019 [facility] neurology -told  Moderate OSA   HRCT 08/2019 >> Bibasilar scarring. No evidence of interstitial lung disease.  Mild air trapping, indicative of small airways disease.mild emphysema   2014 -Spirometrynoairway obstruction,FEV1 2.37-85% and ratio of 83.  Review of Systems neg for any significant sore throat, dysphagia, itching, sneezing, nasal congestion or excess/ purulent secretions, fever, chills, sweats, unintended wt loss, pleuritic or exertional cp, hempoptysis, orthopnea pnd or change in chronic leg swelling. Also denies presyncope, palpitations, heartburn, abdominal pain, nausea, vomiting, diarrhea or change in bowel or urinary habits, dysuria,hematuria, rash, arthralgias, visual complaints, headache, numbness weakness or ataxia.     Objective:   Physical Exam  Gen. Pleasant, obese, in no distress ENT - no lesions, no post nasal  drip Neck: No JVD, no thyromegaly, no carotid bruits Lungs: no use of accessory muscles, no dullness to percussion, decreased without rales or rhonchi  Cardiovascular: Rhythm regular, heart sounds  normal, no murmurs or gallops, no peripheral edema Musculoskeletal: No deformities, no cyanosis or clubbing , no tremors       Assessment & Plan:

## 2020-07-13 NOTE — Assessment & Plan Note (Signed)
Sleep abdominal was reviewed which shows excellent control of her events on auto settings with average pressure of 12 cm and maximum pressure of 13 cm with mild leak.  She has good usage about 6 hours every night.  I complemented her on this.  She is feeling better rested and CPAP recently helped improve her daytime somnolence and fatigue She is aiming for weight loss of another 20 pounds and if she does get below 180 pounds I would be interested in repeating her sleep study Weight loss encouraged, compliance with goal of at least 4-6 hrs every night is the expectation. Advised against medications with sedative side effects Cautioned against driving when sleepy - understanding that sleepiness will vary on a day to day basis

## 2020-07-13 NOTE — Patient Instructions (Signed)
CPAP is working well on current Engineer, structural on weight loss ! Goal is lower than 180

## 2020-07-20 ENCOUNTER — Encounter: Payer: Self-pay | Admitting: Physician Assistant

## 2020-07-20 ENCOUNTER — Ambulatory Visit (INDEPENDENT_AMBULATORY_CARE_PROVIDER_SITE_OTHER): Payer: No Typology Code available for payment source | Admitting: Physician Assistant

## 2020-07-20 ENCOUNTER — Other Ambulatory Visit: Payer: Self-pay

## 2020-07-20 VITALS — BP 137/80 | HR 80 | Resp 16 | Ht 65.0 in | Wt 190.0 lb

## 2020-07-20 DIAGNOSIS — M0609 Rheumatoid arthritis without rheumatoid factor, multiple sites: Secondary | ICD-10-CM | POA: Diagnosis not present

## 2020-07-20 DIAGNOSIS — F32A Depression, unspecified: Secondary | ICD-10-CM

## 2020-07-20 DIAGNOSIS — E210 Primary hyperparathyroidism: Secondary | ICD-10-CM

## 2020-07-20 DIAGNOSIS — Z8759 Personal history of other complications of pregnancy, childbirth and the puerperium: Secondary | ICD-10-CM

## 2020-07-20 DIAGNOSIS — Z79899 Other long term (current) drug therapy: Secondary | ICD-10-CM | POA: Diagnosis not present

## 2020-07-20 DIAGNOSIS — M19072 Primary osteoarthritis, left ankle and foot: Secondary | ICD-10-CM

## 2020-07-20 DIAGNOSIS — Z96611 Presence of right artificial shoulder joint: Secondary | ICD-10-CM | POA: Diagnosis not present

## 2020-07-20 DIAGNOSIS — F419 Anxiety disorder, unspecified: Secondary | ICD-10-CM

## 2020-07-20 DIAGNOSIS — M8589 Other specified disorders of bone density and structure, multiple sites: Secondary | ICD-10-CM | POA: Diagnosis not present

## 2020-07-20 DIAGNOSIS — Z8719 Personal history of other diseases of the digestive system: Secondary | ICD-10-CM

## 2020-07-20 DIAGNOSIS — Z8669 Personal history of other diseases of the nervous system and sense organs: Secondary | ICD-10-CM

## 2020-07-20 DIAGNOSIS — Z111 Encounter for screening for respiratory tuberculosis: Secondary | ICD-10-CM

## 2020-07-20 DIAGNOSIS — I10 Essential (primary) hypertension: Secondary | ICD-10-CM

## 2020-07-20 DIAGNOSIS — E063 Autoimmune thyroiditis: Secondary | ICD-10-CM

## 2020-07-20 DIAGNOSIS — M19042 Primary osteoarthritis, left hand: Secondary | ICD-10-CM

## 2020-07-20 DIAGNOSIS — Z8639 Personal history of other endocrine, nutritional and metabolic disease: Secondary | ICD-10-CM

## 2020-07-20 DIAGNOSIS — M19071 Primary osteoarthritis, right ankle and foot: Secondary | ICD-10-CM

## 2020-07-20 DIAGNOSIS — Z87891 Personal history of nicotine dependence: Secondary | ICD-10-CM

## 2020-07-20 DIAGNOSIS — M19041 Primary osteoarthritis, right hand: Secondary | ICD-10-CM

## 2020-07-20 MED ORDER — PREDNISONE 5 MG PO TABS: ORAL_TABLET | ORAL | 0 refills | Status: DC

## 2020-07-20 NOTE — Patient Instructions (Signed)
Standing Labs We placed an order today for your standing lab work.   Please have your standing labs drawn in 2 weeks and then every 3 months    If possible, please have your labs drawn 2 weeks prior to your appointment so that the provider can discuss your results at your appointment.  We have open lab daily Monday through Thursday from 1:30-4:30 PM and Friday from 1:30-4:00 PM at the office of Dr. Bo Merino, [facility].   Please be advised, all patients with office appointments requiring lab work will take precedents over walk-in lab work.  If possible, please come for your lab work on Monday and Friday afternoons, as you may experience shorter wait times. The office is located at [address] [place], [place] [address], [place] [address] No appointment is necessary.   Labs are drawn by [facility]. Please bring your co-pay at the time of your lab draw.  You may receive a bill from [facility] for your lab work.  If you wish to have your labs drawn at another location, please call the office 24 hours in advance to send orders.  If you have any questions regarding directions or hours of operation,  please call [phone].   As a reminder, please drink plenty of water prior to coming for your lab work. Thanks!

## 2020-07-24 ENCOUNTER — Telehealth: Payer: Self-pay

## 2020-07-24 NOTE — Telephone Encounter (Signed)
FMLA paperwork has been faxed and confirmation received. I called patient and advised. A copy is at the front desk for patient to pick up.

## 2020-07-26 LAB — HM COLONOSCOPY

## 2020-07-30 ENCOUNTER — Encounter: Payer: Self-pay | Admitting: Family Medicine

## 2020-07-30 ENCOUNTER — Telehealth: Payer: Self-pay | Admitting: Family Medicine

## 2020-07-30 NOTE — Telephone Encounter (Signed)
Patient sent mychart request as well and still awaiting providers response.

## 2020-07-30 NOTE — Telephone Encounter (Signed)
Spoke with patient and advised her that she will have to come in to pick up stool kit and she stated that she has a speciman this morning.  I advised her that we would be unable to collect it.  She stated she lives 1 hour away.  I advised her that I will talk with lab personal about this and she if we can help her out in anyway.

## 2020-07-30 NOTE — Telephone Encounter (Signed)
Pt,called and stated that she has diarrhea for the past 5 days she covid negative however she was taken Clinadmycin for an abscess tooth she, want to know can she bring in a stool sample to be tested for other issue

## 2020-07-31 ENCOUNTER — Other Ambulatory Visit (INDEPENDENT_AMBULATORY_CARE_PROVIDER_SITE_OTHER): Payer: No Typology Code available for payment source

## 2020-07-31 ENCOUNTER — Other Ambulatory Visit: Payer: Self-pay

## 2020-07-31 ENCOUNTER — Other Ambulatory Visit: Payer: Self-pay | Admitting: *Deleted

## 2020-07-31 DIAGNOSIS — R197 Diarrhea, unspecified: Secondary | ICD-10-CM

## 2020-07-31 NOTE — Telephone Encounter (Signed)
Spoke with patient and advised her that she has to come pickup stool kit or go to a [facility] or [facility] location near her.  She will come by to pickup stool kit

## 2020-08-01 ENCOUNTER — Other Ambulatory Visit: Payer: Self-pay | Admitting: Family Medicine

## 2020-08-01 ENCOUNTER — Other Ambulatory Visit: Payer: Self-pay

## 2020-08-01 LAB — FECAL LACTOFERRIN, QUANT
Fecal Lactoferrin: POSITIVE — AB
MICRO NUMBER:: 11928252
SPECIMEN QUALITY:: ADEQUATE

## 2020-08-01 LAB — CLOSTRIDIUM DIFFICILE BY PCR: Toxigenic C. Difficile by PCR: NEGATIVE

## 2020-08-01 MED ORDER — FLUCONAZOLE 150 MG PO TABS: ORAL_TABLET | ORAL | 0 refills | Status: DC

## 2020-08-02 ENCOUNTER — Telehealth: Payer: Self-pay | Admitting: Family Medicine

## 2020-08-02 NOTE — Telephone Encounter (Signed)
Rx fixed this morning and spoke with patient and she did pickup.

## 2020-08-02 NOTE — Telephone Encounter (Signed)
Pt, called to follow up on correct prescription for fluconazole  Please advice

## 2020-08-14 ENCOUNTER — Telehealth: Payer: Self-pay

## 2020-08-14 ENCOUNTER — Telehealth: Payer: Self-pay | Admitting: Family Medicine

## 2020-08-14 NOTE — Telephone Encounter (Signed)
Patient states she is almost done with diflucan treatment. Patient states she needs clearance to re-start her rheumatology medication

## 2020-08-14 NOTE — Telephone Encounter (Signed)
See below

## 2020-08-14 NOTE — Telephone Encounter (Signed)
Please clarify if the infection has cleared and if she has received clearance to restart Orencia and [place]?

## 2020-08-14 NOTE — Telephone Encounter (Signed)
Patient states the infection has cleared. Patient states she has not received clearance to restart [place]. Patient states she will contact the PCP for clearance. Patient states she will let us know what the PCP says.

## 2020-08-14 NOTE — Telephone Encounter (Signed)
As long as she is feeling well she can restart her rheumatolic meds.

## 2020-08-14 NOTE — Telephone Encounter (Signed)
Patient called stating she had a recent colin infection and prescribed Diflucan.  Patient states she finished the medication about 2 weeks ago and requested a return call to let her know if she should stay off her [place] until her appointment on 08/31/20 with Lovena Le.

## 2020-08-15 ENCOUNTER — Telehealth: Payer: Self-pay

## 2020-08-15 NOTE — Telephone Encounter (Signed)
Patient called to let Seth Bake know that her PCP gave her clearance to restart her [place] and [place].

## 2020-08-15 NOTE — Telephone Encounter (Signed)
Lvm with details below

## 2020-08-15 NOTE — Telephone Encounter (Signed)
Ok to resume [place] if her symptoms have completely resolved.

## 2020-08-15 NOTE — Telephone Encounter (Signed)
Per phone note on 08/14/2020, PCP states As long as she is feeling well she can restart her rheumatolic meds. Okay for patient to restart medications?

## 2020-08-16 NOTE — Telephone Encounter (Signed)
Left message to advise patient ok to resume [place] if her symptoms have completely resolved.

## 2020-08-17 NOTE — Progress Notes (Signed)
Office Visit Note  Patient: Sandra [name]             Date of Birth: [birth year]           MRN: [id number]             PCP: [name], MD Referring: [name], MD Visit Date: 08/31/2020 Occupation: @GUAROCC @  Subjective:  Medication monitoring   History of Present Illness: ZOWIE LUNDAHL is a 60 y.o. female with history of seronegative rheumatoid arthritis and osteoarthritis.  She is on orencia 125 mg sq injections once weekly and arava 20 mg 1 tablet by mouth daily. She was recently treated for an abscessed tooth with clindamycin.  She developed a yeast infection after taking clindamycin and was treated with Diflucan which she has now completed.  She held both [place] while taking the antibiotic followed by the antifungal.  She was cleared by her PCP to resume both medications about 2 weeks ago.  She states that she was having some pain, stiffness, and joint swelling in both hands and both feet while off of Orencia and Arava.  She is also having some discomfort on the sides of both hips which she attributed to increasing her walking regimen.  She has been trying to work on weight loss. She is supposed to return to work on Monday but would like more time to allow [place] to get back in her system to manage her rheumatoid arthritis before returning to work.     Activities of Daily Living:  Patient reports morning stiffness for 2 hours.   Patient Reports nocturnal pain.  Difficulty dressing/grooming: Denies Difficulty climbing stairs: Denies Difficulty getting out of chair: Denies Difficulty using hands for taps, buttons, cutlery, and/or writing: Denies  Review of Systems  Constitutional:  Positive for fatigue.  HENT:  Positive for mouth dryness. Negative for mouth sores and nose dryness.   Eyes:  Negative for pain, visual disturbance and dryness.  Respiratory:  Negative for cough, hemoptysis, shortness of breath and difficulty breathing.    Cardiovascular:  Negative for chest pain, palpitations, hypertension and swelling in legs/feet.  Gastrointestinal:  Negative for blood in stool, constipation and diarrhea.  Endocrine: Negative for increased urination.  Genitourinary:  Negative for painful urination.  Musculoskeletal:  Negative for joint pain, joint pain, joint swelling, myalgias, muscle weakness, morning stiffness, muscle tenderness and myalgias.  Skin:  Negative for color change, pallor, rash, hair loss, nodules/bumps, skin tightness, ulcers and sensitivity to sunlight.  Allergic/Immunologic: Negative for susceptible to infections.  Neurological:  Negative for dizziness, numbness, headaches and weakness.  Hematological:  Negative for swollen glands.  Psychiatric/Behavioral:  Negative for depressed mood and sleep disturbance. The patient is not nervous/anxious.    PMFS History:  Patient Active Problem List   Diagnosis Date Noted   Back pain 12/13/2019   Rheumatoid arthritis ([place]) 12/02/2019   Anxiety and depression 08/01/2019   Arthralgia 05/04/2019   OSA (obstructive sleep apnea) 05/03/2019   Thyroiditis 12/20/2018   Muscle cramp 12/20/2018   Eustachian tube dysfunction 07/27/2017   Headache 11/25/2015   Acute bacterial sinusitis 03/26/2015   Hematuria 04/09/2014   Hyperglycemia 04/06/2014   Diarrhea 09/21/2013   Hyperparathyroidism, primary ([place]) 07/12/2013   Dyspnea 12/28/2012   Hypercalcemia 12/16/2012   H/O tobacco use, presenting hazards to health 11/29/2012   Overactive bladder 05/31/2012   Elevated LFTs 05/31/2012   Obesity (BMI 30-39.9) 05/31/2012   Allergic rhinitis 05/31/2012   Hoarseness 02/04/2012  Preventative health care 02/04/2012   SUI (stress urinary incontinence, female) 02/04/2012   Migraine    GERD (gastroesophageal reflux disease)    Rotator cuff syndrome of right shoulder 06/11/2011   Atypical chest pain 01/03/2011   Hypertension 01/03/2011   Hyperlipidemia 01/03/2011   KNEE PAIN,  BILATERAL 09/28/2008   ANSERINE BURSITIS 09/28/2008   CAVUS DEFORMITY OF FOOT, ACQUIRED 09/28/2008    Past Medical History:  Diagnosis Date   Allergic rhinitis 05/31/2012   Chest pain    PT SAW CARDIOLOGIST DR. Burt Knack - STRESS ECHO DONE NEGATIVE - NO PROBLEM SINCE   Chicken pox as achild   Cough 11/29/2012   RESOLVED - IT WAS CAUSED BY LISINOPRIL   Elevated LFTs 05/31/2012   Eustachian tube dysfunction 07/27/2017   GERD (gastroesophageal reflux disease)    H/O tobacco use, presenting hazards to health 11/29/2012   Smoked 1 1/2 ppd for 20 years quit in roughtly 2007   Headache 11/25/2015   Hematuria 04/09/2014   Hoarseness 02/04/2012   RESOLVED - THOUGHT TO HAVE BEEN ALLERY RELATED   Hyperglycemia 04/06/2014   Hyperlipidemia    Hyperparathyroidism ([place])    Hypertension    Obesity, unspecified 05/31/2012   Ocular migraine    Overactive bladder 05/31/2012   Preventative health care 02/04/2012   PTSD (post-traumatic stress disorder)    Rapid heart beat    PT FEELS PROB RELATED TO HYPER PARATHYROIDISM   Rheumatoid arthritis ([facility])    SUI (stress urinary incontinence, female) 02/04/2012   Sees Dr Perlie Gold and they have discussed a bladder tack but so far she declines    Family History  Problem Relation Age of Onset   Hypertension Mother 31       alive   Renal cancer Mother        renal   Hyperlipidemia Mother    Osteoporosis Mother        humerus    Macular degeneration Mother    Tremor Mother    Parkinson's disease Mother    Hypertension Father 27       alive   Atrial fibrillation Father    Colon cancer Father        colon   Hyperlipidemia Father    Stroke Father    Alzheimer's disease Father    Hypertension Brother 42       alive   Tremor Brother    Hypertension Sister    Lung cancer Maternal Grandfather        lung/ smoker   Emphysema Maternal Grandfather    Heart disease Paternal Grandmother    Other Paternal Grandfather        black lung   Heart disease Paternal  Grandfather    Asthma Son    Eczema Son    Healthy Son    Healthy Son    Heart disease Paternal Aunt    Heart disease Paternal Uncle    Past Surgical History:  Procedure Laterality Date   BREAST EXCISIONAL BIOPSY Left    Age 48 Fibroadenoma   BREAST SURGERY     left breast excision of adenoma   btl     PARATHYROIDECTOMY Right 08/12/2013   Procedure: right inferior frozen section PARATHYROIDECTOMY;  Surgeon: Earnstine Regal, MD;  Location: [facility];  Service: General;  Laterality: Right;   PELVIC LAPAROSCOPY  03-2004   TOTAL SHOULDER REPLACEMENT Right 01/2019   TUBAL LIGATION     WISDOM TOOTH EXTRACTION  30 yrs ago   Social History  Social History Narrative   Not on file   Immunization History  Administered Date(s) Administered   Hepatitis A, Adult 09/04/2016, 03/27/2017   Influenza Split 12/09/2011   Influenza,inj,Quad PF,6+ Mos 11/29/2012, 11/01/2013, 11/20/2015, 12/29/2017, 04/03/2019, 12/13/2019   PFIZER(Purple Top)SARS-COV-2 Vaccination 05/20/2019, 06/15/2019, 01/11/2020   Pneumococcal Polysaccharide-23 08/01/2019   Tdap 06/07/2015   Zoster Recombinat (Shingrix) 08/01/2019     Objective: Vital Signs: BP 137/84 (BP Location: Left Arm, Patient Position: Sitting, Cuff Size: Large)   Pulse 75   Resp 12   Ht 5\' 5"  (1.651 m)   Wt 183 lb 6.4 oz (83.2 kg)   LMP 04/01/2011   BMI 30.52 kg/m    Physical Exam Vitals and nursing note reviewed.  Constitutional:      Appearance: She is well-developed.  HENT:     Head: Normocephalic and atraumatic.  Eyes:     Conjunctiva/sclera: Conjunctivae normal.  Pulmonary:     Effort: Pulmonary effort is normal.  Abdominal:     Palpations: Abdomen is soft.  Musculoskeletal:     Cervical back: Normal range of motion.  Skin:    General: Skin is warm and dry.     Capillary Refill: Capillary refill takes less than 2 seconds.  Neurological:     Mental Status: She is alert and oriented to person, place, and time.  Psychiatric:         Behavior: Behavior normal.     Musculoskeletal Exam: C-spine, thoracic spine, and lumbar spine good ROM.  Right shoulder replacement has good ROM.  Left shoulder joint, elbow joints, and wrist joints have good ROM with no discomfort or tenderness.  Tenderness and mild synovial thickening over bilateral 2nd and 3rd MCP joints, right greater than left.  Tenderness of the left 1st MCP and CMC joint.  Complete fist formation bilaterally. Hip joints good ROM with no discomfort.  No tenderness over trochanteric bursa.  Knee joints good ROM with no warmth or effusion.  Tenderness of the right ankle.  Tenderness and synovitis of right 3rd and 4th MTP joints.    CDAI Exam: CDAI Score: 5.8  Patient Global: 5 mm; Provider Global: 3 mm Swollen: 2 ; Tender: 12  Joint Exam 08/31/2020      Right  Left  CMC      Tender  MCP 1      Tender  MCP 2   Tender   Tender  MCP 3   Tender   Tender  Ankle   Tender     MTP 2  Swollen Tender   Tender  MTP 3  Swollen Tender   Tender  MTP 4      Tender     Investigation: No additional findings.  Imaging: No results found.  Recent Labs: Lab Results  Component Value Date   WBC 5.2 05/29/2020   HGB 12.3 05/29/2020   PLT 261.0 05/29/2020   NA 141 05/29/2020   K 4.2 05/29/2020   CL 103 05/29/2020   CO2 30 05/29/2020   GLUCOSE 96 05/29/2020   BUN 12 05/29/2020   CREATININE 0.63 05/29/2020   BILITOT 0.5 05/29/2020   ALKPHOS 81 05/29/2020   AST 31 05/29/2020   ALT 38 (H) 05/29/2020   PROT 6.6 05/29/2020   ALBUMIN 4.3 05/29/2020   CALCIUM 9.7 05/29/2020   GFRAA 117 04/12/2020   QFTBGOLDPLUS NEGATIVE 07/26/2019    Speciality Comments: No specialty comments available.  Procedures:  No procedures performed Allergies: Cefdinir, Doxycycline, Codeine, Penicillins, Simvastatin, and Other   Assessment /  Plan:     Visit Diagnoses: Rheumatoid arthritis of multiple sites with negative rheumatoid factor ([place]) - (Discontinued Humira due to inadequate response  (11/23/19-02/15/20)  Discontinued MTX due to increased joint pain): She has tenderness and synovitis of the right third and fourth MTP joints on examination today.  She continues to have chronic pain and stiffness in both hands and both feet.  She was recently treated for an abscessed tooth with clindamycin.  She developed a yeast infection while on clindamycin and was treated with Diflucan.  She held both [place] while being treated for both infections.  She resumed both medications 2 weeks ago but is unsure the true efficacy of combination therapy due to the interruption.  We discussed avoiding the use of prednisone due to the risks for multiple prednisone tapers and long-term use.  She plans on continuing to work on weight loss and would like to increase her exercise regimen gradually.  She will remain on the current treatment regimen and will require more time to reevaluate the true efficacy of combination therapy.  She requested to have her work leave extended to 10/08/2020.  She will follow-up on 10/02/2020 to reassess how she is doing.  High risk medication use - Orencia 125 mg sq injections once weekly and Arava 20 mg 1 tablet by mouth daily. Inadequate response to Humira and Enbrel. CBC and CMP drawn on 05/29/20.  TB gold negative on 07/26/19.  Orders for CBC, CMP, and TB gold released today.  Her next lab work will be due in September and every 3 months to monitor for drug toxicity.  Standing orders for CBC and CMP are in place.- Plan: CBC with Differential/Platelet, COMPLETE METABOLIC PANEL WITH GFR, QuantiFERON-TB Gold Plus She recently had Orencia and Arava while being treated for an infection with clindamycin.  She developed a yeast infection while taking clindamycin and was treated with Diflucan which she has since completed.  We discussed the importance of holding [place] if she develops signs or symptoms of an infection and to resume only once the infection has completely cleared.   She was advised to notify us if she starts to have recurrent infections.  Screening for tuberculosis - Order for TB gold released today. Plan: QuantiFERON-TB Gold Plus  Osteopenia of multiple sites - DEXA updated on 08/12/19: The BMD measured at Forearm Radius 33% is 0.751 g/cm2 with a T-score of -1.4.  She is taking vitamin D 4000 units daily.  Status post total shoulder replacement, right: Doing well.  She has good ROM with no discomfort at this time.   Primary osteoarthritis of both hands: She has PIP and DIP thickening consistent with osteoarthritis of both hands.  Tenderness over the left CMC joint.  She continues to experience pain and stiffness in both hands on a daily basis.  We discussed the importance of joint protection and muscle strengthening.  She can use Voltaren gel topically as needed for pain relief. She was encouraged to continue to take natural anti-inflammatories as previously discussed.  Primary osteoarthritis of both feet: She continues to experience chronic pain in both feet.  She has tenderness and mild warmth over the right ankle.  Tenderness and synovitis of the right third and fourth MTP joints.  PIP and DIP thickening consistent with osteoarthritis of both feet noted.  We discussed the importance of wearing proper fitting shoes.  Other medical conditions are listed as follows:   Hyperparathyroidism, primary ([place])  History of gastroesophageal reflux (GERD)  Anxiety and depression  Essential hypertension  Hashimoto's thyroiditis  History of hyperlipidemia  Hx of migraines  History of miscarriage  Former smoker    Orders: Orders Placed This Encounter  Procedures   CBC with Differential/Platelet   COMPLETE METABOLIC PANEL WITH GFR   QuantiFERON-TB Gold Plus   Meds ordered this encounter  Medications   leflunomide (ARAVA) 20 MG tablet    Sig: Take 1 tablet (20 mg total) by mouth daily.    Dispense:  90 tablet    Refill:  0     Follow-Up  Instructions: Return for Rheumatoid arthritis, Osteoarthritis.   Ofilia Neas, PA-C  Note - This record has been created using Dragon software.  Chart creation errors have been sought, but may not always  have been located. Such creation errors do not reflect on  the standard of medical care.

## 2020-08-21 ENCOUNTER — Other Ambulatory Visit: Payer: Self-pay | Admitting: Physician Assistant

## 2020-08-21 DIAGNOSIS — M0609 Rheumatoid arthritis without rheumatoid factor, multiple sites: Secondary | ICD-10-CM

## 2020-08-21 NOTE — Telephone Encounter (Signed)
Next Visit: 08/31/2020  Last Visit: 07/20/2020  Last Fill: 06/26/2020  [id number] arthritis of multiple sites with negative rheumatoid factor  Current Dose per office note 07/20/2020: Orencia 125 mg sq injections once weekly  Labs: 05/29/2020, ALT 38,   TB Gold: 07/26/2019, negative   Okay to refill Orencia?

## 2020-08-21 NOTE — Telephone Encounter (Signed)
Patient will need updated lab work including CBC, CMP, and TB gold at follow up appt on 08/31/20.

## 2020-08-21 NOTE — Telephone Encounter (Signed)
Noted on patient's appointment.

## 2020-08-31 ENCOUNTER — Other Ambulatory Visit (HOSPITAL_COMMUNITY): Payer: Self-pay

## 2020-08-31 ENCOUNTER — Encounter: Payer: Self-pay | Admitting: Physician Assistant

## 2020-08-31 ENCOUNTER — Ambulatory Visit: Payer: No Typology Code available for payment source | Admitting: Physician Assistant

## 2020-08-31 ENCOUNTER — Other Ambulatory Visit: Payer: Self-pay

## 2020-08-31 ENCOUNTER — Telehealth: Payer: Self-pay | Admitting: Pharmacist

## 2020-08-31 VITALS — BP 137/84 | HR 75 | Resp 12 | Ht 65.0 in | Wt 183.4 lb

## 2020-08-31 DIAGNOSIS — I10 Essential (primary) hypertension: Secondary | ICD-10-CM

## 2020-08-31 DIAGNOSIS — Z8759 Personal history of other complications of pregnancy, childbirth and the puerperium: Secondary | ICD-10-CM

## 2020-08-31 DIAGNOSIS — Z79899 Other long term (current) drug therapy: Secondary | ICD-10-CM

## 2020-08-31 DIAGNOSIS — F32A Depression, unspecified: Secondary | ICD-10-CM

## 2020-08-31 DIAGNOSIS — M19071 Primary osteoarthritis, right ankle and foot: Secondary | ICD-10-CM

## 2020-08-31 DIAGNOSIS — M0609 Rheumatoid arthritis without rheumatoid factor, multiple sites: Secondary | ICD-10-CM

## 2020-08-31 DIAGNOSIS — M8589 Other specified disorders of bone density and structure, multiple sites: Secondary | ICD-10-CM

## 2020-08-31 DIAGNOSIS — F419 Anxiety disorder, unspecified: Secondary | ICD-10-CM

## 2020-08-31 DIAGNOSIS — E210 Primary hyperparathyroidism: Secondary | ICD-10-CM

## 2020-08-31 DIAGNOSIS — Z8639 Personal history of other endocrine, nutritional and metabolic disease: Secondary | ICD-10-CM

## 2020-08-31 DIAGNOSIS — M19042 Primary osteoarthritis, left hand: Secondary | ICD-10-CM

## 2020-08-31 DIAGNOSIS — Z111 Encounter for screening for respiratory tuberculosis: Secondary | ICD-10-CM

## 2020-08-31 DIAGNOSIS — M19072 Primary osteoarthritis, left ankle and foot: Secondary | ICD-10-CM

## 2020-08-31 DIAGNOSIS — Z96611 Presence of right artificial shoulder joint: Secondary | ICD-10-CM

## 2020-08-31 DIAGNOSIS — Z8719 Personal history of other diseases of the digestive system: Secondary | ICD-10-CM

## 2020-08-31 DIAGNOSIS — E063 Autoimmune thyroiditis: Secondary | ICD-10-CM

## 2020-08-31 DIAGNOSIS — M19041 Primary osteoarthritis, right hand: Secondary | ICD-10-CM

## 2020-08-31 DIAGNOSIS — Z87891 Personal history of nicotine dependence: Secondary | ICD-10-CM

## 2020-08-31 DIAGNOSIS — Z8669 Personal history of other diseases of the nervous system and sense organs: Secondary | ICD-10-CM

## 2020-08-31 MED ORDER — LEFLUNOMIDE 20 MG PO TABS: 20.0000 mg | ORAL_TABLET | Freq: Every day | ORAL | 0 refills | Status: DC

## 2020-08-31 NOTE — Telephone Encounter (Signed)
Patient had OV with Hazel Sams, PA-C, today and stated she may have lost prescription coverage of Orencia. She plans to f/u with plan today.  Pharmacy team will reach out to patient on Monday, 09/03/20 for update. If she is uninsured, we will need to pursue patient assistance through [employer].  Routing to pharmacy pool for f/u this upcoming week  Knox Saliva, PharmD, MPH Clinical Pharmacist (Rheumatology and Pulmonology)

## 2020-09-02 LAB — CBC WITH DIFFERENTIAL/PLATELET
Absolute Monocytes: 676 cells/uL (ref 200–950)
Basophils Absolute: 52 cells/uL (ref 0–200)
Basophils Relative: 0.8 %
Eosinophils Absolute: 98 cells/uL (ref 15–500)
Eosinophils Relative: 1.5 %
HCT: 42.3 % (ref 35.0–45.0)
Hemoglobin: 13.7 g/dL (ref 11.7–15.5)
Lymphs Abs: 2308 cells/uL (ref 850–3900)
MCH: 29.4 pg (ref 27.0–33.0)
MCHC: 32.4 g/dL (ref 32.0–36.0)
MCV: 90.8 fL (ref 80.0–100.0)
MPV: 10.9 fL (ref 7.5–12.5)
Monocytes Relative: 10.4 %
Neutro Abs: 3367 cells/uL (ref 1500–7800)
Neutrophils Relative %: 51.8 %
Platelets: 285 10*3/uL (ref 140–400)
RBC: 4.66 10*6/uL (ref 3.80–5.10)
RDW: 12.6 % (ref 11.0–15.0)
Total Lymphocyte: 35.5 %
WBC: 6.5 10*3/uL (ref 3.8–10.8)

## 2020-09-02 LAB — COMPLETE METABOLIC PANEL WITH GFR
AG Ratio: 1.7 (calc) (ref 1.0–2.5)
ALT: 15 U/L (ref 6–29)
AST: 17 U/L (ref 10–35)
Albumin: 4.4 g/dL (ref 3.6–5.1)
Alkaline phosphatase (APISO): 85 U/L (ref 37–153)
BUN: 18 mg/dL (ref 7–25)
CO2: 29 mmol/L (ref 20–32)
Calcium: 9.6 mg/dL (ref 8.6–10.4)
Chloride: 104 mmol/L (ref 98–110)
Creat: 0.59 mg/dL (ref 0.50–1.05)
GFR, Est African American: 117 mL/min/{1.73_m2} (ref >=60)
GFR, Est Non African American: 101 mL/min/{1.73_m2} (ref >=60)
Globulin: 2.6 g/dL (calc) (ref 1.9–3.7)
Glucose, Bld: 99 mg/dL (ref 65–99)
Potassium: 4.7 mmol/L (ref 3.5–5.3)
Sodium: 139 mmol/L (ref 135–146)
Total Bilirubin: 0.4 mg/dL (ref 0.2–1.2)
Total Protein: 7 g/dL (ref 6.1–8.1)

## 2020-09-02 LAB — QUANTIFERON-TB GOLD PLUS
Mitogen-NIL: >10 IU/mL
NIL: 0.04 IU/mL
QuantiFERON-TB Gold Plus: NEGATIVE
TB1-NIL: 0.01 IU/mL
TB2-NIL: 0.01 IU/mL

## 2020-09-02 NOTE — Progress Notes (Signed)
CBC and CMP are normal.

## 2020-09-03 NOTE — Telephone Encounter (Addendum)
ATC patient to discuss insurance status - left VM for her requesting she return call to discuss. Will print and mail Orencia patient assistance application to her today just in case since it may take 7-10 business days to reach her at home.  Provider portion placed in folder for Hazel Sams, PA-C, to sign  Knox Saliva, PharmD, MPH Clinical Pharmacist (Rheumatology and Pulmonology)

## 2020-09-03 NOTE — Telephone Encounter (Signed)
Patient left a voicemail returning your call. Patient requested a return call. Thanks!

## 2020-09-03 NOTE — Progress Notes (Signed)
CBC, and CMP are normal.  TB Gold is negative.

## 2020-09-03 NOTE — Telephone Encounter (Signed)
Returned call to patient regarding insurance update for next steps for Orencia. Unable to reach again - left VM stating I will return call tomorrow morning  Knox Saliva, PharmD, MPH Clinical Pharmacist (Rheumatology and Pulmonology)

## 2020-09-04 ENCOUNTER — Telehealth: Payer: Self-pay | Admitting: Pulmonary Disease

## 2020-09-04 ENCOUNTER — Other Ambulatory Visit: Payer: Self-pay | Admitting: Family Medicine

## 2020-09-04 DIAGNOSIS — F419 Anxiety disorder, unspecified: Secondary | ICD-10-CM

## 2020-09-04 DIAGNOSIS — F32A Depression, unspecified: Secondary | ICD-10-CM

## 2020-09-04 NOTE — Telephone Encounter (Signed)
Called and spoke with patient. She stated that at her last OV with RA back in May 2022 he advised her to call back once she had lost more weight. She is now down to 179lbs. She is requesting to have her cpap settings adjusted.   I tried to attach the download to this message but it will not allow me.   Per her download, her current settings are at 5-15cm.   RA, please advise. Thanks.

## 2020-09-04 NOTE — Telephone Encounter (Signed)
Called and spoke with patient. She verbalized understanding. She will call us back in 3 months to update [place] on her weight loss.   Nothing further needed at time of call.

## 2020-09-05 NOTE — Telephone Encounter (Addendum)
ATC patient for update on insurance status. Unable to reach. Will try again next week. I've already mailed her Orencia PAP application on 9/40/76  Provider portion complete. Will send to scan center and place in pending PAP folder.  Knox Saliva, PharmD, MPH Clinical Pharmacist (Rheumatology and Pulmonology)

## 2020-09-11 NOTE — Telephone Encounter (Signed)
Patient returned call and stated that she is currently uninsured but may be in the process of enrolling into new plan. Advised that we have already mailed her the patient assistance form for Orencia to complete and return to office

## 2020-09-17 ENCOUNTER — Telehealth: Payer: Self-pay

## 2020-09-17 ENCOUNTER — Other Ambulatory Visit (HOSPITAL_COMMUNITY): Payer: Self-pay

## 2020-09-17 NOTE — Telephone Encounter (Signed)
Per Vincente Liberty:  Patient called stating she now has insurance and needs to resubmit the co-pay card for her Orencia medication.   Patient's insurance is [employer] [place] [address] [place]  [address] Phone [phone] Rx [phone] ID #[id number] Primary insured is her husband Sandra [name] DOB 03-08-1972   LVM with patient requesting call back for additional plan information. Direct office number provided.

## 2020-09-17 NOTE — Telephone Encounter (Signed)
Patient called stating she now has insurance and needs to resubmit the co-pay card for her Orencia medication.   Patient's insurance is [employer] [place] [address]  [address] Phone [phone] Rx [phone] ID #[id number]  Primary insured is her husband Momoka Stringfield DOB 03-08-1972

## 2020-09-17 NOTE — Telephone Encounter (Signed)
Additional insurance plan information will be needed in order to proceed with [facility]. Called and LVM requesting patient to return call. Direct office line provided.

## 2020-09-18 ENCOUNTER — Other Ambulatory Visit (HOSPITAL_COMMUNITY): Payer: Self-pay

## 2020-09-18 MED ORDER — ORENCIA CLICKJECT 125 MG/ML ~~LOC~~ SOAJ: SUBCUTANEOUS | 0 refills | Status: DC

## 2020-09-18 NOTE — Telephone Encounter (Signed)
Received notification from [place] regarding a prior authorization for [facility]. Authorization has been APPROVED from 09/18/2020 to 09/18/2021.  Patient must fill through [facility] [phone]  Authorization # [id number]  Please send in new prescription to pharmacy.  Reached out to pt and walked her through the next steps. Informed her that the new Rx will be sent in and the pharmacy will reach out to her to coordinate shipment of medication, at which time she can provide copay card information.

## 2020-09-18 NOTE — Telephone Encounter (Signed)
Submitted a Prior Authorization request to  [employer]  for [facility] via CoverMyMeds. Will update once we receive a response.   Key: Luanne Bras

## 2020-09-18 NOTE — Progress Notes (Signed)
Office Visit Note  Patient: Sandra [name]             Date of Birth: 06-22-1960           MRN: [id number]             PCP: [name], MD Referring: [name], MD Visit Date: 10/02/2020 Occupation: @GUAROCC @  Subjective:  Pain in both ankles and feet   History of Present Illness: Sandra [name] is a 60 y.o. female with history of seronegative rheumatoid arthritis and osteoarthritis.  She is on orencia 125 mg sq injections once weekly and arava 20 mg 1 tablet by mouth daily. She has not missed any doses of orencia or arava since her last visit.  She states she continues to a have an injection site reaction with the weekly orencia injections.  She has been tolerating arava without any side effects.  She states she does not feel that Maureen Chatters is working.  She continues to experience generalized aching and fatigue.  She has noticed swelling in both ankles and both feet.  She has been taking lasix and does not think the swelling is due to fluid retention.  She states she experiences a throbbing sensation and tenderness in both hands and both feet which is worse at night.   She denies any recent infections.  She denies any personal history of diverticulitis, blood clots, or Mis.       Activities of Daily Living:  Patient reports morning stiffness for all day. Patient Reports nocturnal pain.  Difficulty dressing/grooming: Denies Difficulty climbing stairs: Denies Difficulty getting out of chair: Denies Difficulty using hands for taps, buttons, cutlery, and/or writing: Reports  Review of Systems  Constitutional:  Positive for fatigue.  HENT:  Positive for mouth dryness. Negative for mouth sores and nose dryness.   Eyes:  Negative for pain, itching and dryness.  Respiratory:  Negative for shortness of breath and difficulty breathing.   Cardiovascular:  Negative for chest pain and palpitations.  Gastrointestinal:  Negative for blood in stool, constipation and diarrhea.   Endocrine: Negative for increased urination.  Genitourinary:  Negative for difficulty urinating.  Musculoskeletal:  Positive for joint pain, joint pain, joint swelling, myalgias, morning stiffness, muscle tenderness and myalgias.  Skin:  Negative for color change, rash and redness.  Allergic/Immunologic: Positive for susceptible to infections.  Neurological:  Positive for weakness. Negative for dizziness, numbness, headaches and memory loss.  Hematological:  Positive for bruising/bleeding tendency.  Psychiatric/Behavioral:  Negative for confusion.    PMFS History:  Patient Active Problem List   Diagnosis Date Noted   Back pain 12/13/2019   Rheumatoid arthritis ([place]) 12/02/2019   Anxiety and depression 08/01/2019   Arthralgia 05/04/2019   OSA (obstructive sleep apnea) 05/03/2019   Thyroiditis 12/20/2018   Muscle cramp 12/20/2018   Eustachian tube dysfunction 07/27/2017   Headache 11/25/2015   Acute bacterial sinusitis 03/26/2015   Hematuria 04/09/2014   Hyperglycemia 04/06/2014   Diarrhea 09/21/2013   Hyperparathyroidism, primary ([place]) 07/12/2013   Dyspnea 12/28/2012   Hypercalcemia 12/16/2012   H/O tobacco use, presenting hazards to health 11/29/2012   Overactive bladder 05/31/2012   Elevated LFTs 05/31/2012   Obesity (BMI 30-39.9) 05/31/2012   Allergic rhinitis 05/31/2012   Hoarseness 02/04/2012   Preventative health care 02/04/2012   SUI (stress urinary incontinence, female) 02/04/2012   Migraine    GERD (gastroesophageal reflux disease)    Rotator cuff syndrome of right shoulder 06/11/2011   Atypical chest pain  01/03/2011   Hypertension 01/03/2011   Hyperlipidemia 01/03/2011   KNEE PAIN, BILATERAL 09/28/2008   ANSERINE BURSITIS 09/28/2008   CAVUS DEFORMITY OF FOOT, ACQUIRED 09/28/2008    Past Medical History:  Diagnosis Date   Allergic rhinitis 05/31/2012   Chest pain    PT SAW CARDIOLOGIST DR. Burt Knack - STRESS ECHO DONE NEGATIVE - NO PROBLEM SINCE   Chicken pox  as achild   Cough 11/29/2012   RESOLVED - IT WAS CAUSED BY LISINOPRIL   Elevated LFTs 05/31/2012   Eustachian tube dysfunction 07/27/2017   GERD (gastroesophageal reflux disease)    H/O tobacco use, presenting hazards to health 11/29/2012   Smoked 1 1/2 ppd for 20 years quit in roughtly 2007   Headache 11/25/2015   Hematuria 04/09/2014   Hoarseness 02/04/2012   RESOLVED - THOUGHT TO HAVE BEEN ALLERY RELATED   Hyperglycemia 04/06/2014   Hyperlipidemia    Hyperparathyroidism ([place])    Hypertension    Obesity, unspecified 05/31/2012   Ocular migraine    Overactive bladder 05/31/2012   Preventative health care 02/04/2012   PTSD (post-traumatic stress disorder)    Rapid heart beat    PT FEELS PROB RELATED TO HYPER PARATHYROIDISM   Rheumatoid arthritis ([facility])    SUI (stress urinary incontinence, female) 02/04/2012   Sees Dr Perlie Gold and they have discussed a bladder tack but so far she declines    Family History  Problem Relation Age of Onset   Hypertension Mother 33       alive   Renal cancer Mother        renal   Hyperlipidemia Mother    Osteoporosis Mother        humerus    Macular degeneration Mother    Tremor Mother    Parkinson's disease Mother    Hypertension Father 43       alive   Atrial fibrillation Father    Colon cancer Father        colon   Hyperlipidemia Father    Stroke Father    Alzheimer's disease Father    Hypertension Brother 51       alive   Tremor Brother    Hypertension Sister    Lung cancer Maternal Grandfather        lung/ smoker   Emphysema Maternal Grandfather    Heart disease Paternal Grandmother    Other Paternal Grandfather        black lung   Heart disease Paternal Grandfather    Asthma Son    Eczema Son    Healthy Son    Healthy Son    Heart disease Paternal Aunt    Heart disease Paternal Uncle    Past Surgical History:  Procedure Laterality Date   BREAST EXCISIONAL BIOPSY Left    Age 35 Fibroadenoma   BREAST SURGERY     left breast  excision of adenoma   btl     COLONOSCOPY  07/2020   PARATHYROIDECTOMY Right 08/12/2013   Procedure: right inferior frozen section PARATHYROIDECTOMY;  Surgeon: Earnstine Regal, MD;  Location: [facility];  Service: General;  Laterality: Right;   PELVIC LAPAROSCOPY  03/2004   TOTAL SHOULDER REPLACEMENT Right 01/2019   TUBAL LIGATION     WISDOM TOOTH EXTRACTION  30 yrs ago   Social History   Social History Narrative   Not on file   Immunization History  Administered Date(s) Administered   Hepatitis A, Adult 09/04/2016, 03/27/2017   Influenza Split 12/09/2011   Influenza,inj,Quad PF,6+  Mos 11/29/2012, 11/01/2013, 11/20/2015, 12/29/2017, 04/03/2019, 12/13/2019   PFIZER(Purple Top)SARS-COV-2 Vaccination 05/20/2019, 06/15/2019, 01/11/2020   Pneumococcal Polysaccharide-23 08/01/2019   Tdap 06/07/2015   Zoster Recombinat (Shingrix) 08/01/2019     Objective: Vital Signs: BP 127/82 (BP Location: Left Arm, Patient Position: Sitting, Cuff Size: Normal)   Pulse 69   Ht 5\' 5"  (1.651 m)   Wt 183 lb 9.6 oz (83.3 kg)   LMP 04/01/2011   BMI 30.55 kg/m    Physical Exam Vitals and nursing note reviewed.  Constitutional:      Appearance: She is well-developed.  HENT:     Head: Normocephalic and atraumatic.  Eyes:     Conjunctiva/sclera: Conjunctivae normal.  Pulmonary:     Effort: Pulmonary effort is normal.  Abdominal:     Palpations: Abdomen is soft.  Musculoskeletal:     Cervical back: Normal range of motion.  Skin:    General: Skin is warm and dry.     Capillary Refill: Capillary refill takes less than 2 seconds.  Neurological:     Mental Status: She is alert and oriented to person, place, and time.  Psychiatric:        Behavior: Behavior normal.     Musculoskeletal Exam: C-spine, thoracic spine, and lumbar spine good ROM.  Shoulder joints, elbow joints, wrist joints, MCPs, PIPs, and DIPs good ROM with no synovitis. Tenderness over bilateral 2nd and 3rd MCP joints.  Tenderness over  PIP joints.  PIP and DIP thickening consistent with osteoarthritis of both hands. Hip joints, knee joints, and ankle joints have good ROM with no discomfort.  Warmth of the left knee noted. Tenderness of both ankle joints. Warmth of the right ankle. Tenderness and synovitis of bilateral 2nd and 3rd MTPs and left 4th MTP.  No evidence of achilles tendonitis or plantar fasciitis.   CDAI Exam: CDAI Score: 5.4  Patient Global: 8 mm; Provider Global: 6 mm Swollen: 5 ; Tender: 16  Joint Exam 10/02/2020      Right  Left  MCP 2   Tender   Tender  MCP 3   Tender   Tender  Ankle   Tender   Tender  MTP 1   Tender   Tender  MTP 2  Swollen Tender  Swollen Tender  MTP 3  Swollen Tender  Swollen Tender  MTP 4   Tender  Swollen Tender  MTP 5   Tender   Tender     Investigation: No additional findings.  Imaging: No results found.  Recent Labs: Lab Results  Component Value Date   WBC 6.5 08/31/2020   HGB 13.7 08/31/2020   PLT 285 08/31/2020   NA 139 08/31/2020   K 4.7 08/31/2020   CL 104 08/31/2020   CO2 29 08/31/2020   GLUCOSE 99 08/31/2020   BUN 18 08/31/2020   CREATININE 0.59 08/31/2020   BILITOT 0.4 08/31/2020   ALKPHOS 81 05/29/2020   AST 17 08/31/2020   ALT 15 08/31/2020   PROT 7.0 08/31/2020   ALBUMIN 4.3 05/29/2020   CALCIUM 9.6 08/31/2020   GFRAA 117 08/31/2020   QFTBGOLDPLUS NEGATIVE 08/31/2020    Speciality Comments: No specialty comments available.  Procedures:  No procedures performed Allergies: Cefdinir, Doxycycline, Codeine, Penicillins, Simvastatin, and Other    Assessment / Plan:     Visit Diagnoses: Rheumatoid arthritis of multiple sites with negative rheumatoid factor ([place]) -She presents today with ongoing tenderness and inflammation in multiple joints.  She has been experiencing severe pain in both ankles and  both feet.  She has tenderness and synovitis over several MTP joints and warmth of the right ankle on exam.  She has been experiencing a throbbing  sensation in both hands and both feet, worse at night.  She is also having significant fatigue and generalized aching on a daily basis.  She is currently on Orencia 125 mg sq injections once weekly and Arava 20 mg 1 tablet by mouth daily.  According to the patient she has not missed any doses of either medication since her last office visit.  She does not feel that Orencia is effective at managing her arthritis.  She has been on Orencia since April and has had several gaps in therapy due to infection but she does not feel that it is managing her symptoms.  She is feeling very discouraged by the amount of pain and stiffness she continues to experience.  In the past she previously had an inadequate response to methotrexate, Humira, and Enbrel.  We discussed different treatment options today in detail.  Indications, contraindications, potential side effects of Rinvoq were discussed today.  All questions were addressed and consent was obtained today.  We will apply for Enbrel through her insurance.  She will remain on [place] as prescribed.  She was advised to notify us if she cannot tolerate taking Rinvoq.  She will follow-up in the office in about 6 weeks to assess her response.  She plans to return to the office on 11/16/2020 to assess her response to combination therapy and would like to return to work on 11/19/2020.  Counseled patient that Rinvoq is a JAK inhibitor indicated for Rheumatoid Arthritis.  Counseled patient on purpose, proper use, and adverse effects of Rinvoq.    Reviewed the most common adverse effects including infection, diarrhea, headaches.  Also reviewed rare adverse effects such as bowel injury and the need to contact us if they develop stomach pain during treatment. Counseled on the increase risk of venous thrombosis. Counseled about FDA black box warning of MACE (major adverse CV events including cardiovascular death, myocardial infarction, and stroke).  Reviewed with patient that there is the  possibility of an increased risk of malignancy specifically lung cancer and lymphomas but it is not well understood if this increased risk is due to the medication or the disease state. Instructed patient that medication should be held for infection and prior to surgery.  Advised patient to avoid live vaccines. Recommend annual influenza, PCV 15 or PCV20 or Pneumovax 23, and Shingrix as indicated.    Reviewed importance of routine lab monitoring including lipid panel.  Will recheck lipid panel 3 months after starting and annually thereafter. CBC and CMP will be monitored routinely every 3 months. Standing orders placed. Provided patient with medication education material and answered all questions.  Patient consented to Rinvoq.  Will upload into patient's chart.  Will apply through patient's insurance and update when we receive a response.    Patient dose will be 15 mg daily.  Prescription will be sent to pharmacy pending lab results and insurance approval.  High risk medication use -Applying for Rinvoq 15 mg 1 tablet by mouth daily.  She will continue taking Arava 20 mg 1 tablet by mouth daily.  Previous therapy:  MTX d/c due to increased joint pain. Humira -September to December 2021. Enbrel-injection site reactions-January 2022-April 2022 Orencia-inadequate response & injection site reactions: April-July 2022. CMP within normal limits on 08/31/2020.  She will return for lab work in 1 month and every 3 months  after starting on Rinvoq.  TB Gold negative on 08/31/2020. Lipid panel updated on 05/29/2020.  She is taking Lipitor as prescribed. Discuss the importance of holding rinvoq and arava if she develops signs or symptoms of an infection.   Discussed the black box warning including increased MACE and malignancy associated with Jak inhibitors.   Osteopenia of multiple sites - DEXA updated on 08/12/19: The BMD measured at Forearm Radius 33% is 0.751 g/cm2 with a T-score of -1.4.   Status post total  shoulder replacement, right: Doing well.  She has good range of motion with no discomfort at this time.  Primary osteoarthritis of both hands: She has PIP and DIP thickening consistent with osteoarthritis of both hands.  She has been experiencing a throbbing sensation and tenderness in both of her hands which is worse at night.  She has tenderness over bilateral second and third MCP joints. She remains on turmeric, tart cherry, ginger, and omega-3 on a daily basis.  Primary osteoarthritis of both feet: She has been experiencing significant discomfort in both ankle joints and both feet.  She has warmth and tenderness over the right ankle.  Tenderness and synovitis over several MCP joints as described above.  She is not having difficulty walking at times due to the severity of pain.  She is also been experiencing nocturnal discomfort.  Other medical conditions are listed as follows:   Hyperparathyroidism, primary ([place])  Anxiety and depression  Hashimoto's thyroiditis  History of gastroesophageal reflux (GERD)  Essential hypertension  Hx of migraines  History of miscarriage  History of hyperlipidemia  Former smoker  Orders: No orders of the defined types were placed in this encounter.  No orders of the defined types were placed in this encounter.   Follow-Up Instructions: Return for Rheumatoid arthritis, Osteoarthritis.   Ofilia Neas, PA-C  Note - This record has been created using Dragon software.  Chart creation errors have been sought, but may not always  have been located. Such creation errors do not reflect on  the standard of medical care.

## 2020-10-02 ENCOUNTER — Telehealth: Payer: Self-pay | Admitting: Pharmacist

## 2020-10-02 ENCOUNTER — Ambulatory Visit (INDEPENDENT_AMBULATORY_CARE_PROVIDER_SITE_OTHER): Payer: 59 | Admitting: Physician Assistant

## 2020-10-02 ENCOUNTER — Other Ambulatory Visit (HOSPITAL_COMMUNITY): Payer: Self-pay

## 2020-10-02 ENCOUNTER — Encounter: Payer: Self-pay | Admitting: Physician Assistant

## 2020-10-02 ENCOUNTER — Other Ambulatory Visit: Payer: Self-pay

## 2020-10-02 VITALS — BP 127/82 | HR 69 | Ht 65.0 in | Wt 183.6 lb

## 2020-10-02 DIAGNOSIS — Z8639 Personal history of other endocrine, nutritional and metabolic disease: Secondary | ICD-10-CM

## 2020-10-02 DIAGNOSIS — M19042 Primary osteoarthritis, left hand: Secondary | ICD-10-CM

## 2020-10-02 DIAGNOSIS — Z79899 Other long term (current) drug therapy: Secondary | ICD-10-CM | POA: Diagnosis not present

## 2020-10-02 DIAGNOSIS — I10 Essential (primary) hypertension: Secondary | ICD-10-CM

## 2020-10-02 DIAGNOSIS — F32A Depression, unspecified: Secondary | ICD-10-CM

## 2020-10-02 DIAGNOSIS — M0609 Rheumatoid arthritis without rheumatoid factor, multiple sites: Secondary | ICD-10-CM

## 2020-10-02 DIAGNOSIS — Z96611 Presence of right artificial shoulder joint: Secondary | ICD-10-CM | POA: Diagnosis not present

## 2020-10-02 DIAGNOSIS — Z87891 Personal history of nicotine dependence: Secondary | ICD-10-CM

## 2020-10-02 DIAGNOSIS — Z8759 Personal history of other complications of pregnancy, childbirth and the puerperium: Secondary | ICD-10-CM

## 2020-10-02 DIAGNOSIS — E063 Autoimmune thyroiditis: Secondary | ICD-10-CM

## 2020-10-02 DIAGNOSIS — F419 Anxiety disorder, unspecified: Secondary | ICD-10-CM

## 2020-10-02 DIAGNOSIS — M8589 Other specified disorders of bone density and structure, multiple sites: Secondary | ICD-10-CM | POA: Diagnosis not present

## 2020-10-02 DIAGNOSIS — M19041 Primary osteoarthritis, right hand: Secondary | ICD-10-CM

## 2020-10-02 DIAGNOSIS — Z8719 Personal history of other diseases of the digestive system: Secondary | ICD-10-CM

## 2020-10-02 DIAGNOSIS — E210 Primary hyperparathyroidism: Secondary | ICD-10-CM

## 2020-10-02 DIAGNOSIS — M19072 Primary osteoarthritis, left ankle and foot: Secondary | ICD-10-CM

## 2020-10-02 DIAGNOSIS — M19071 Primary osteoarthritis, right ankle and foot: Secondary | ICD-10-CM

## 2020-10-02 DIAGNOSIS — Z8669 Personal history of other diseases of the nervous system and sense organs: Secondary | ICD-10-CM

## 2020-10-02 NOTE — Telephone Encounter (Signed)
Please start Rinvoq BIV.  Dose: '15mg'$  every day  Dx: RA (M06.09)  Previously tried therapies: Enbrel - injection site reaction Humira - injection site reaction Orencia - injection site reaction Orencia - inadequate response, started 06/26/20 Methotrexate - inadequate response  Knox Saliva, PharmD, MPH, BCPS Clinical Pharmacist (Rheumatology and Pulmonology)

## 2020-10-02 NOTE — Patient Instructions (Addendum)
We will investigate Rinvoq coverage. You will not need to come into the clinic to start Rinvoq. Once approved, Sandra [name] will reach out with guidelines on when to start.  Vaccines You are taking a medication(s) that can suppress your immune system.  The following immunizations are recommended: Flu annually Covid-19  Td/Tdap (tetanus, diphtheria, pertussis) every 10 years Pneumonia (Prevnar 15 then Pneumovax 23 at least 1 year apart.  Alternatively, can take Prevnar 20 without needing additional dose) Shingrix (after age 26): 2 doses from 4 weeks to 6 months apart  Please check with your PCP to make sure you are up to date.   Standing Labs We placed an order today for your standing lab work.   Please have your standing labs drawn in 1 month and every 3 months   If possible, please have your labs drawn 2 weeks prior to your appointment so that the provider can discuss your results at your appointment.  Please note that you may see your imaging and lab results in [facility] before we have reviewed them. We may be awaiting multiple results to interpret others before contacting you. Please allow our office up to 72 hours to thoroughly review all of the results before contacting the office for clarification of your results.  We have open lab daily: Monday through Thursday from 1:30-4:30 PM and Friday from 1:30-4:00 PM at the office of Dr. Bo Merino, [facility].   Please be advised, all patients with office appointments requiring lab work will take precedent over walk-in lab work.  If possible, please come for your lab work on Monday and Friday afternoons, as you may experience shorter wait times. The office is located at [address] [place], [place], [place] [address] No appointment is necessary.   Labs are drawn by [facility]. Please bring your co-pay at the time of your lab draw.  You may receive a bill from [facility] for your lab work.  If you wish to have your labs drawn at  another location, please call the office 24 hours in advance to send orders.  If you have any questions regarding directions or hours of operation,  please call [phone].   As a reminder, please drink plenty of water prior to coming for your lab work. Thanks!  Upadacitinib Extended-release Tablets What is this medication? UPADACITINIB (ue PAD a SYE ti nib) treats rheumatoid arthritis, psoriatic arthritis, and atopic dermatitis. It works on the immune system. It belongs toa group of medicines called JAK inhibitors. This medicine may be used for other purposes; ask your health care provider orpharmacist if you have questions. COMMON BRAND NAME(S): RINVOQ What should I tell my care team before I take this medication? They need to know if you have any of these conditions: blood clots cancer diabetes (high blood sugar) heart disease high blood pressure high cholesterol immune system problems infection especially a viral infection such as chickenpox, cold sores, or herpes infection such as tuberculosis (TB) or other bacterial, fungal or viral infection liver disease low blood counts (white cells, platelets, or red blood cells) lung or breathing disease (asthma, COPD) organ transplant recent or upcoming vaccine skin cancer/melanoma smoke tobacco cigarettes stomach or intestine problems stroke an unusual or allergic reaction to upadacitinib, other medicines, foods, dyes or preservatives pregnant or trying to get pregnant breast-feeding How should I use this medication? Take this medication by mouth with water. Take it as directed on the prescription label at the same time every day. Do not cut, crush, or  chew this medicine. Swallow the tablets whole. You can take it with or without food. If it upsets your stomach, take it with food. Keep taking it unless your care teamtells you to stop. A special MedGuide will be given to you by the pharmacist with eachprescription and refill. Be sure  to read this information carefully each time. Talk to your care team about the use of this medication in children. While it may be prescribed for children as young as 12 years for select conditions,precautions do apply. Overdosage: If you think you have taken too much of this medicine contact apoison control center or emergency room at once. NOTE: This medicine is only for you. Do not share this medicine with others. What if I miss a dose? If you miss a dose, take it as soon as you can. If it is almost time for yournext dose, take only that dose. Do not take double or extra doses. What may interact with this medication? Do not take this medicine with any of the following medications: baricitinib tofacitinib This medicine may also interact with the following medications: azathioprine biologic medicines such as abatacept, adalimumab, anakinra, certolizumab, etanercept, golimumab, infliximab, rituximab, secukinumab, tocilizumab, ustekinumab certain medicines for fungal infections like ketoconazole, itraconazole, or posaconazole certain medicines for seizures like carbamazepine, phenobarbital, phenytoin clarithromycin cyclosporine live vaccines medicines that lower your chance of fighting infection rifampin supplements, such as St. John's wort This list may not describe all possible interactions. Give your health care provider a list of all the medicines, herbs, non-prescription drugs, or dietary supplements you use. Also tell them if you smoke, drink alcohol, or use illegaldrugs. Some items may interact with your medicine. What should I watch for while using this medication? Visit your care team for regular checks on your progress. Tell your care teamif your symptoms do not start to get better or if they get worse. You may need blood work done while you are taking this medication. Avoid taking medications that contain aspirin, acetaminophen, ibuprofen, naproxen, or ketoprofen unless instructed  by your care team. These medicationsmay hide a fever. This medication may increase your risk of getting an infection. Call your care team for advice if you get a fever, chills, sore throat, or other symptoms of a cold or flu. Do not treat yourself. Try to avoid being around people who aresick. Do not become pregnant while taking this medication. Women should inform their care team if they wish to become pregnant or think they might be pregnant. Women should use a form of birth control while taking this medication. Women will also need to take it for 4 weeks after stopping this medication. There is potential for serious harm to an unborn child. Talk to your health careprovider for more information. Do not breast-feed an infant while taking this medication or for 6 days afterstopping it. Talk to your care team about your risk of cancer. You may be more at risk forcertain types of cancer if you take this medication. Talk to your care team about your risk of skin cancer. You may be more at riskfor skin cancer if you take this medication. This medication can make you more sensitive to the sun. Keep out of the sun. If you cannot avoid being in the sun, wear protective clothing and sunscreen. Donot use sun lamps or tanning beds/booths. Tell your care team right away if you have any change in your eyesight. What side effects may I notice from receiving this medication? Side effects that you should  report to your doctor or health care professionalas soon as possible: Allergic reactions-skin rash, itching, hives, swelling of the face, lips, tongue, or throat Blood clot-pain, swelling, or warmth in the leg, shortness of breath, chest pain Change in vision Heart attack-pain or tightness in the chest, shoulders, arms, or jaw, nausea, shortness of breath, cold or clammy skin, feeling faint or lightheaded Infection-fever, chills, cough, sore throat, wounds that don't heal, pain or trouble when passing urine, general  feeling of discomfort or being unwell Liver injury-right upper belly pain, loss of appetite, nausea, light-colored stool, dark yellow or brown urine, yellowing skin or eyes, unusual weakness or fatigue Low red blood cell count-unusually weakness or fatigue, dizziness, headache, trouble breathing Stomach pain Stroke-sudden numbness or weakness of the face, arm, or leg, trouble speaking, confusion, trouble walking, loss of balance or coordination, dizziness, severe headache, change in vision Side effects that usually do not require medical attention (report these toyour doctor or health care professional if they continue or are bothersome): Runny or stuffy nose Nausea This list may not describe all possible side effects. Call your doctor for medical advice about side effects. You may report side effects to FDA at1-800-FDA-1088. Where should I keep my medication? Keep out of the reach of children and pets. Store at room temperature between 20 and 25 degrees C (68 and 77 degrees F).Get rid of any unused medicine after the expiration date. To get rid of medicines that are no longer needed or have expired: Take the medicine to a medicine take-back program. Check with your pharmacy or law enforcement to find a location. If you cannot return the medicine, check the label or package insert to see if the medicine should be thrown out in the garbage or flushed down the toilet. If you are not sure, ask your health care provider. If it is safe to put it in the trash, empty the medicine out of the container. Mix the medicine with cat litter, dirt, coffee grounds, or other unwanted substance. Seal the mixture in a bag or container. Put it in the trash. NOTE: This sheet is a summary. It may not cover all possible information. If you have questions about this medicine, talk to your doctor, pharmacist, orhealth care provider.  2022 Elsevier/Gold Standard (2020-05-08 13:47:36)

## 2020-10-02 NOTE — Telephone Encounter (Signed)
Submitted a Prior Authorization request to [facility] for [facility] via CoverMyMeds. Will update once we receive a response.   Key: [id number]

## 2020-10-02 NOTE — Telephone Encounter (Signed)
Received notification from [facility] regarding a prior authorization for [facility]. Authorization has been APPROVED from 10/02/2020 to 10/02/2021.   Patient must fill through [facility] [phone]  Authorization # [id number]   Copay Card info: RxBIN: [id number] RxPCN: [id number] RxGroup: [id number] ID: [id number] Suf: 01

## 2020-10-02 NOTE — Progress Notes (Signed)
Pharmacy Note  Subjective: Patient presents today to [facility] for follow up office visit. Patient seen by the pharmacist for counseling on Rinvoq for rheumatoid arthritis.  Previous therapy include: Humira, Enbrel, and Orencia. She has injection site reactions to both Humira and Enbrel. She had injection site reaction to Orencia (that includes redness and itchiness that lasts x 3 days as well as inadequate clinical response. She takes Orencia on Thursday and is due on 10/04/20. Has two remaining Orencia pens at home.  History of diverticulitis:  No  History of MI, stroke, or CV events:  No  Objective:  CMP     Component Value Date/Time   NA 139 08/31/2020 0858   K 4.7 08/31/2020 0858   CL 104 08/31/2020 0858   CO2 29 08/31/2020 0858   GLUCOSE 99 08/31/2020 0858   BUN 18 08/31/2020 0858   CREATININE 0.59 08/31/2020 0858   CALCIUM 9.6 08/31/2020 0858   PROT 7.0 08/31/2020 0858   ALBUMIN 4.3 05/29/2020 1442   AST 17 08/31/2020 0858   ALT 15 08/31/2020 0858   ALKPHOS 81 05/29/2020 1442    CBC    Component Value Date/Time   WBC 6.5 08/31/2020 0858   RBC 4.66 08/31/2020 0858   HGB 13.7 08/31/2020 0858   HCT 42.3 08/31/2020 0858   PLT 285 08/31/2020 0858   MCV 90.8 08/31/2020 0858   MCH 29.4 08/31/2020 0858   MCHC 32.4 08/31/2020 0858   RDW 12.6 08/31/2020 0858    Baseline Immunosuppressant Therapy Labs TB GOLD Quantiferon TB Gold Latest Ref Rng & Units 08/31/2020  Quantiferon TB Gold Plus NEGATIVE NEGATIVE   Hepatitis Panel Hepatitis Latest Ref Rng & Units 07/26/2019  Hep B Surface Ag NON-REACTI NON-REACTIVE  Hep B IgM NON-REACTI NON-REACTIVE  Hep C Ab NON-REACTI NON-REACTIVE  Hep C Ab NON-REACTI NON-REACTIVE  Hep A IgM NON REACTIVE -   HIV Lab Results  Component Value Date   HIV NON-REACTIVE 07/26/2019   Immunoglobulins Immunoglobulin Electrophoresis Latest Ref Rng & Units 07/26/2019  IgA  47 - 310 mg/dL 117  IgG 600 - 1,640 mg/dL 1,098  IgM 50  - 300 mg/dL 148   SPEP Serum Protein Electrophoresis Latest Ref Rng & Units 08/31/2020  Total Protein 6.1 - 8.1 g/dL 7.0  Albumin 3.8 - 4.8 g/dL -  Alpha-1 0.2 - 0.3 g/dL -  Alpha-2 0.5 - 0.9 g/dL -  Beta Globulin 0.4 - 0.6 g/dL -  Beta 2 0.2 - 0.5 g/dL -  Gamma Globulin 0.8 - 1.7 g/dL -   G6PD No results found for: G6PDH TPMT No results found for: TPMT   Lipid Panel Lab Results  Component Value Date   CHOL 183 05/29/2020   HDL 41.60 05/29/2020   LDLCALC 167 (H) 12/13/2019   LDLDIRECT 112.0 05/29/2020   TRIG 239.0 (H) 05/29/2020   CHOLHDL 4 05/29/2020    Assessment/Plan:  Counseled patient that Rinvoq is a JAK inhibitor indicated for Rheumatoid Arthritis.  Counseled patient on purpose, proper use, and adverse effects of Rinvoq.    Reviewed the most common adverse effects including infection, diarrhea, headaches.  Also reviewed rare adverse effects such as bowel injury and the need to contact us if they develop stomach pain during treatment. Counseled on the increase risk of venous thrombosis. Counseled about FDA black box warning of MACE (major adverse CV events including cardiovascular death, myocardial infarction, and stroke).  Reviewed with patient that there is the possibility of an increased risk of malignancy specifically  lung cancer and lymphomas but it is not well understood if this increased risk is due to the medication or the disease state. Instructed patient that medication should be held for infection and prior to surgery.  Advised patient to avoid live vaccines. Recommend annual influenza, PCV 15 or PCV20 or Pneumovax 23, and Shingrix as indicated.    She is UTD on pneumonia vaccine. States that she believes she has received 2 zoster vaccines but Epic review shows only one dose - she will reach out to her PCP to inquire  She takes irbesartan '150mg'$  once daily for hyperlipidemia and primary CV prevention.  Reviewed importance of routine lab monitoring including lipid  panel.  Will recheck lipid panel 3 months after starting and annually thereafter. CBC and CMP will be monitored routinely every 3 months. Standing orders placed. Provided patient with medication education material and answered all questions.  Patient consented to Rinvoq.  Will upload into patient's chart.  Will apply through patient's insurance and update when we receive a response.    Patient dose will be 15 mg daily.  Prescription will be sent to pharmacy pending lab results and insurance approval.   Knox Saliva, PharmD, MPH, BCPS Clinical Pharmacist (Rheumatology and Pulmonology)

## 2020-10-03 ENCOUNTER — Ambulatory Visit (INDEPENDENT_AMBULATORY_CARE_PROVIDER_SITE_OTHER): Payer: 59

## 2020-10-03 DIAGNOSIS — Z23 Encounter for immunization: Secondary | ICD-10-CM | POA: Diagnosis not present

## 2020-10-03 MED ORDER — RINVOQ 15 MG PO TB24: 15.0000 mg | ORAL_TABLET | Freq: Every day | ORAL | 0 refills | Status: DC

## 2020-10-03 NOTE — Telephone Encounter (Signed)
Spoke with patient regarding approval. She will plan to take Orencia tomorrow 10/04/20 and start Rinvoq next Thursday, 10/11/20.  Rx for Rinvoq '15mg'$  once daily sent to [facility] with copay card information. Rx sent for 60 days only as patient has f/u on 11/16/20 with Hazel Sams, PA-C and will be due for labs. She was sent a Mychart message with [facility] phone number and copay card billing information.   Standing lab orders for CBC with diff and CMP with GFR remain in place. Future order placed for lipid panel for monitoring in approximately 4-8 weeks after starting.  She will continue leflunomide '20mg'$  once daily as prescribed.  Patient verbalized understanding and all questions encouraged and answered  Knox Saliva, PharmD, MPH, BCPS Clinical Pharmacist (Rheumatology and Pulmonology)

## 2020-10-03 NOTE — Progress Notes (Signed)
Pt is here today for 2nd shingrix vaccine. Pt was shingrix vaccine in left deltoid. Pt tolerated well

## 2020-10-05 ENCOUNTER — Telehealth: Payer: Self-pay

## 2020-10-05 NOTE — Telephone Encounter (Signed)
Patient left a voicemail stating she had her Shingles vaccine #2 on the 27th and had a bad reaction which included body ache, fever, headache, chills, fatigue so she held her Orencia yesterday.  She states she feels much better today so she started her Rinvoq.  Patient states she did test herself for Covid and it was negative.  Patient states she doesn't need a call back.

## 2020-10-08 ENCOUNTER — Telehealth: Payer: Self-pay

## 2020-10-08 NOTE — Telephone Encounter (Signed)
Pt called stating that she is a reaction to the last shingles vaccine from 10/03/20. She was unable to get out of bed and felt like she has flu like sx for 36hrs.   HA, Bodyache, Fever, chills & Fatigue.   Pt wanted this information noted in the chart and she is glad that it is her last one.   FYI TO PROVIDER.

## 2020-10-22 ENCOUNTER — Telehealth: Payer: Self-pay | Admitting: Rheumatology

## 2020-10-22 NOTE — Telephone Encounter (Signed)
I called patient, paperwork refaxed, copy at front desk for patient to pick up.

## 2020-10-22 NOTE — Telephone Encounter (Signed)
Patient calling because work is stating they did not receive her paperwork for her leave. Patient requesting you refax papers to number on top of paper, and call to confirm with her that fax went thru.

## 2020-11-01 ENCOUNTER — Ambulatory Visit (INDEPENDENT_AMBULATORY_CARE_PROVIDER_SITE_OTHER): Payer: 59 | Admitting: Internal Medicine

## 2020-11-01 ENCOUNTER — Encounter (HOSPITAL_BASED_OUTPATIENT_CLINIC_OR_DEPARTMENT_OTHER): Payer: Self-pay

## 2020-11-01 ENCOUNTER — Emergency Department (HOSPITAL_BASED_OUTPATIENT_CLINIC_OR_DEPARTMENT_OTHER)
Admission: EM | Admit: 2020-11-01 | Discharge: 2020-11-01 | Disposition: A | Discharge status: Home / Self Care | Payer: 59 | Attending: Emergency Medicine | Admitting: Emergency Medicine

## 2020-11-01 ENCOUNTER — Emergency Department (HOSPITAL_BASED_OUTPATIENT_CLINIC_OR_DEPARTMENT_OTHER): Payer: 59

## 2020-11-01 ENCOUNTER — Other Ambulatory Visit: Payer: Self-pay

## 2020-11-01 ENCOUNTER — Encounter: Payer: Self-pay | Admitting: Internal Medicine

## 2020-11-01 VITALS — BP 152/98 | HR 67 | Temp 97.8°F | Resp 16 | Ht 65.0 in | Wt 177.4 lb

## 2020-11-01 DIAGNOSIS — R0781 Pleurodynia: Secondary | ICD-10-CM | POA: Diagnosis present

## 2020-11-01 DIAGNOSIS — Z7982 Long term (current) use of aspirin: Secondary | ICD-10-CM | POA: Diagnosis not present

## 2020-11-01 DIAGNOSIS — Z87891 Personal history of nicotine dependence: Secondary | ICD-10-CM | POA: Diagnosis not present

## 2020-11-01 DIAGNOSIS — Z79899 Other long term (current) drug therapy: Secondary | ICD-10-CM | POA: Insufficient documentation

## 2020-11-01 DIAGNOSIS — I10 Essential (primary) hypertension: Secondary | ICD-10-CM | POA: Insufficient documentation

## 2020-11-01 DIAGNOSIS — R071 Chest pain on breathing: Secondary | ICD-10-CM

## 2020-11-01 LAB — LIPASE, BLOOD: Lipase: 38 U/L (ref 11–51)

## 2020-11-01 LAB — COMPREHENSIVE METABOLIC PANEL
ALT: 30 U/L (ref 0–44)
AST: 26 U/L (ref 15–41)
Albumin: 4.7 g/dL (ref 3.5–5.0)
Alkaline Phosphatase: 88 U/L (ref 38–126)
Anion gap: 10 (ref 5–15)
BUN: 18 mg/dL (ref 6–20)
CO2: 27 mmol/L (ref 22–32)
Calcium: 9.8 mg/dL (ref 8.9–10.3)
Chloride: 101 mmol/L (ref 98–111)
Creatinine, Ser: 0.59 mg/dL (ref 0.44–1.00)
GFR, Estimated: >60 mL/min (ref >60)
Glucose, Bld: 105 mg/dL — ABNORMAL HIGH (ref 70–99)
Potassium: 4.3 mmol/L (ref 3.5–5.1)
Sodium: 138 mmol/L (ref 135–145)
Total Bilirubin: 0.5 mg/dL (ref 0.3–1.2)
Total Protein: 8.6 g/dL — ABNORMAL HIGH (ref 6.5–8.1)

## 2020-11-01 LAB — POC URINALSYSI DIPSTICK (AUTOMATED)
Bilirubin, UA: NEGATIVE
Blood, UA: NEGATIVE
Glucose, UA: NEGATIVE
Ketones, UA: NEGATIVE
Leukocytes, UA: NEGATIVE
Nitrite, UA: NEGATIVE
Protein, UA: NEGATIVE
Spec Grav, UA: 1.01 (ref 1.010–1.025)
Urobilinogen, UA: 0.2 E.U./dL
pH, UA: 7 (ref 5.0–8.0)

## 2020-11-01 LAB — CBC WITH DIFFERENTIAL/PLATELET
Abs Immature Granulocytes: 0.01 10*3/uL (ref 0.00–0.07)
Basophils Absolute: 0 10*3/uL (ref 0.0–0.1)
Basophils Relative: 0 %
Eosinophils Absolute: 0.1 10*3/uL (ref 0.0–0.5)
Eosinophils Relative: 1 %
HCT: 42.9 % (ref 36.0–46.0)
Hemoglobin: 14.3 g/dL (ref 12.0–15.0)
Immature Granulocytes: 0 %
Lymphocytes Relative: 38 %
Lymphs Abs: 2 10*3/uL (ref 0.7–4.0)
MCH: 29.5 pg (ref 26.0–34.0)
MCHC: 33.3 g/dL (ref 30.0–36.0)
MCV: 88.6 fL (ref 80.0–100.0)
Monocytes Absolute: 0.5 10*3/uL (ref 0.1–1.0)
Monocytes Relative: 10 %
Neutro Abs: 2.7 10*3/uL (ref 1.7–7.7)
Neutrophils Relative %: 51 %
Platelets: 240 10*3/uL (ref 150–400)
RBC: 4.84 MIL/uL (ref 3.87–5.11)
RDW: 13.2 % (ref 11.5–15.5)
WBC: 5.3 10*3/uL (ref 4.0–10.5)
nRBC: 0 % (ref 0.0–0.2)

## 2020-11-01 MED ORDER — LIDOCAINE 5 % EX PTCH
1.0000 | MEDICATED_PATCH | CUTANEOUS | Status: DC
  Administered 2020-11-01: 1 via TRANSDERMAL
  Filled 2020-11-01: qty 1

## 2020-11-01 MED ORDER — IOHEXOL 350 MG/ML SOLN
100.0000 mL | Freq: Once | INTRAVENOUS | Status: AC | PRN
  Administered 2020-11-01: 100 mL via INTRAVENOUS

## 2020-11-01 MED ORDER — LIDOCAINE 5 % EX PTCH: 1.0000 | MEDICATED_PATCH | CUTANEOUS | 0 refills | Status: DC

## 2020-11-01 NOTE — ED Triage Notes (Signed)
Pt arrives to ed with right sided shoulder pain "cramping" that radiates around right ribs, started yesterday, denies any fall or injury to the area.

## 2020-11-01 NOTE — Discharge Instructions (Addendum)
Apply Voltaren to area.  May also try Lidoderm patch as discussed.  Follow-up with your primary care provider if pain persists.  Return to the ER for new or worsening pain.

## 2020-11-01 NOTE — ED Notes (Signed)
There is was concern of PE per her PCP, having rt shoulder blade pain, sharp

## 2020-11-01 NOTE — Progress Notes (Signed)
Subjective:    Patient ID: Sandra [name], female    DOB: 09/12/1960, 60 y.o.   MRN: [id number]  DOS:  11/01/2020 Type of visit - description: Acute Symptoms started yesterday. She woke up with pain in the right mid back under the shoulder blade. The pain is steady and definitely increases with deep breaths. Does not change if she bends or twists her torso. At times described as a cramp. Has not been able to find a comfortable position.  She denies any fever chills No cough, no difficulty breathing No recent injury or fall No rash No recent prolonged car trip or airplane trip. No calf pain or swelling. No abdominal pain, nausea or vomiting No dysuria or gross hematuria  BP Readings from Last 3 Encounters:  11/01/20 (!) 166/88  11/01/20 (!) 152/98  10/02/20 127/82     Review of Systems See above   Past Medical History:  Diagnosis Date   Allergic rhinitis 05/31/2012   Chest pain    PT SAW CARDIOLOGIST DR. Burt Knack - STRESS ECHO DONE NEGATIVE - NO PROBLEM SINCE   Chicken pox as achild   Cough 11/29/2012   RESOLVED - IT WAS CAUSED BY LISINOPRIL   Elevated LFTs 05/31/2012   Eustachian tube dysfunction 07/27/2017   GERD (gastroesophageal reflux disease)    H/O tobacco use, presenting hazards to health 11/29/2012   Smoked 1 1/2 ppd for 20 years quit in roughtly 2007   Headache 11/25/2015   Hematuria 04/09/2014   Hoarseness 02/04/2012   RESOLVED - THOUGHT TO HAVE BEEN ALLERY RELATED   Hyperglycemia 04/06/2014   Hyperlipidemia    Hyperparathyroidism ([place])    Hypertension    Obesity, unspecified 05/31/2012   Ocular migraine    Overactive bladder 05/31/2012   Preventative health care 02/04/2012   PTSD (post-traumatic stress disorder)    Rapid heart beat    PT FEELS PROB RELATED TO HYPER PARATHYROIDISM   Rheumatoid arthritis (HCC)    SUI (stress urinary incontinence, female) 02/04/2012   Sees Dr Perlie Gold and they have discussed a bladder tack but so far she declines     Past Surgical History:  Procedure Laterality Date   BREAST EXCISIONAL BIOPSY Left    Age 70 Fibroadenoma   BREAST SURGERY     left breast excision of adenoma   btl     COLONOSCOPY  07/2020   PARATHYROIDECTOMY Right 08/12/2013   Procedure: right inferior frozen section PARATHYROIDECTOMY;  Surgeon: Earnstine Regal, MD;  Location: [facility];  Service: General;  Laterality: Right;   PELVIC LAPAROSCOPY  03/2004   TOTAL SHOULDER REPLACEMENT Right 01/2019   TUBAL LIGATION     WISDOM TOOTH EXTRACTION  30 yrs ago    Allergies as of 11/01/2020       Reactions   Cefdinir Other (See Comments), Nausea And Vomiting   Severe Stomach cramps and diarrhea for several days.   Doxycycline Diarrhea   Codeine Nausea And Vomiting   Penicillins    RASH, ITCHING   Simvastatin    myalgias   Other Rash        Medication List        Accurate as of November 01, 2020  2:44 PM. If you have any questions, ask your nurse or doctor.          ALPRAZolam 0.25 MG tablet Commonly known as: XANAX alprazolam 0.25 mg tablet  TAKE 1 TABLET BY MOUTH 2 TIMES DAILY AS NEEDED FOR ANXIETY.   aspirin 325 MG tablet  Take 325 mg by mouth daily.   atorvastatin 40 MG tablet Commonly known as: LIPITOR Take 1 tablet (40 mg total) by mouth daily.   Cholecalciferol 100 MCG (4000 UT) Caps Vitamin D3 100 mcg (4,000 unit) capsule  Take by oral route.   Co-Enzyme Q-10 30 MG Caps Take 100 mg by mouth at bedtime.   DOCOSAHEXAENOIC ACID PO Take 1 tablet by mouth daily.   escitalopram 10 MG tablet Commonly known as: LEXAPRO TAKE 1 TABLET BY MOUTH EVERY DAY   furosemide 20 MG tablet Commonly known as: LASIX Take 1 tablet (20 mg total) by mouth daily.   GINGER PO Take by mouth.   irbesartan 150 MG tablet Commonly known as: AVAPRO TAKE 1 TABLET BY MOUTH EVERY DAY   KRILL OIL PO krill oil   lactobacillus acidophilus Tabs tablet Take 2 tablets by mouth 3 (three) times daily.   leflunomide 20 MG  tablet Commonly known as: ARAVA Take 1 tablet (20 mg total) by mouth daily.   levothyroxine 75 MCG tablet Commonly known as: SYNTHROID Take 1 tablet (75 mcg total) by mouth daily before breakfast.   lidocaine 5 % Commonly known as: Lidoderm Place 1 patch onto the skin daily. Remove & Discard patch within 12 hours or as directed by MD   metoprolol succinate 25 MG 24 hr tablet Commonly known as: TOPROL-XL Take 1 tablet (25 mg total) by mouth daily.   OMEGA 3 PO Take by mouth.   omeprazole 40 MG capsule Commonly known as: PRILOSEC TAKE 1 CAPSULE BY MOUTH EVERY DAY   OVER THE COUNTER MEDICATION Take 1 capsule by mouth daily. Mega red- 1 capsule daily   Rinvoq 15 MG Tb24 Generic drug: Upadacitinib ER Take 15 mg by mouth daily.   TART CHERRY PO Take by mouth.   Trospium Chloride 60 MG Cp24 trospium ER 60 mg capsule,extended release 24 hr  TAKE 1 CAPSULE BY MOUTH EVERY DAY   TURMERIC PO Take 1 tablet by mouth 2 (two) times daily.   verapamil 120 MG CR tablet Commonly known as: CALAN-SR TAKE 1 TABLET BY MOUTH EVERYDAY AT BEDTIME           Objective:   Physical Exam BP (!) 152/98 (BP Location: Left Arm, Patient Position: Sitting, Cuff Size: Small)   Pulse 67   Temp 97.8 F (36.6 C) (Oral)   Resp 16   Ht '5\' 5"'$  (1.651 m)   Wt 177 lb 6 oz (80.5 kg)   LMP 04/01/2011   SpO2 94%   BMI 29.52 kg/m  General:   Well developed, NAD, BMI noted.  HEENT:  Normocephalic . Face symmetric, atraumatic Lungs:  CTA B Normal respiratory effort, no intercostal retractions, no accessory muscle use. Heart: RRR,  no murmur.  Abdomen:  Not distended, soft, non-tender. No rebound or rigidity.   Skin: No rash at the flanks Lower extremities: no pretibial edema bilaterally.  Calves symmetric, nontender MSK: No TTP at the thoracic spine Neurologic:  alert & oriented X3.  Speech normal, gait appropriate for age and unassisted Psych--  Cognition and judgment appear intact.   Cooperative with normal attention span and concentration.  Behavior appropriate. No anxious or depressed appearing.     Assessment     60 year old female, PMH includes high cholesterol, hypertension, thyroid disease, OSA, rheumatoid arthritis, among other issues, presents with:  Pleuritic right-sided chest pain: Symptoms as described above, DDx includes: MSK, pneumonia, pleurisy, atypical gallbladder stones, early shingles, pulmonary emboli, etc. Udip neg The patient  is a Marine scientist, she is concerned about a clot. We agreed on CT angiogram, rule out PE.  (Normal kidney function, no dye allergies) If negative consider abdominal ultrasound. If +, I talked that frequently patients get admitted to the hospital, she will have a hard time doing that, has to take care of her son.  Fortunately vital signs are stable and she is in no distress. Further advised with results. ER if severe or change in symptoms Addendum: Insurance will not okay CAT scan but  in 24 to 48 hours, unacceptable, referred to the ER.  ER MD notified. HTN: BP slightly elevated today at 152/98, recommend to monitor home and call if it continues to be elevated   This visit occurred during the SARS-CoV-2 public health emergency.  Safety protocols were in place, including screening questions prior to the visit, additional usage of staff PPE, and extensive cleaning of exam room while observing appropriate contact time as indicated for disinfecting solutions.

## 2020-11-01 NOTE — Patient Instructions (Signed)
We will proceed with CT of the chest to rule out a pulmonary emboli.  Tylenol for pain.  Call if you need something stronger  Definitely call or go to the ER if: Fever, chills, severe symptoms, shortness of breath.

## 2020-11-01 NOTE — ED Triage Notes (Signed)
Pt states she was seen by her PCP today who sent her here for CT for concern over PE r/t new medication.

## 2020-11-01 NOTE — ED Provider Notes (Signed)
[place] EMERGENCY DEPARTMENT Provider Note   CSN: [id number] Arrival date & time: 11/01/20  1055     History Chief Complaint  Patient presents with   Shoulder Pain    Sandra [name] is a 60 y.o. female.  60 year old female with past medical history of hypertension, rheumatoid arthritis, hyperparathyroidism, additional history as listed below, presents with complaint of pain deep to the right scapula, described as a cramp, radiates around right ribs.  Pain is worse with deep breaths.  Not associated with nausea, vomiting, shortness of breath, lower extremity edema.  Symptoms started yesterday morning upon waking and have persisted since that time.  Patient went to her PCP today and was sent to the ER for possible PE.  No history or family history of PE or DVT, no recent extended travel, no tobacco use, no hormone replacement therapy, no recent COVID illness or other risk factors.      Past Medical History:  Diagnosis Date   Allergic rhinitis 05/31/2012   Chest pain    PT SAW CARDIOLOGIST DR. Burt Knack - STRESS ECHO DONE NEGATIVE - NO PROBLEM SINCE   Chicken pox as achild   Cough 11/29/2012   RESOLVED - IT WAS CAUSED BY LISINOPRIL   Elevated LFTs 05/31/2012   Eustachian tube dysfunction 07/27/2017   GERD (gastroesophageal reflux disease)    H/O tobacco use, presenting hazards to health 11/29/2012   Smoked 1 1/2 ppd for 20 years quit in roughtly 2007   Headache 11/25/2015   Hematuria 04/09/2014   Hoarseness 02/04/2012   RESOLVED - THOUGHT TO HAVE BEEN ALLERY RELATED   Hyperglycemia 04/06/2014   Hyperlipidemia    Hyperparathyroidism ([place])    Hypertension    Obesity, unspecified 05/31/2012   Ocular migraine    Overactive bladder 05/31/2012   Preventative health care 02/04/2012   PTSD (post-traumatic stress disorder)    Rapid heart beat    PT FEELS PROB RELATED TO HYPER PARATHYROIDISM   Rheumatoid arthritis ([place])    SUI (stress urinary incontinence, female) 02/04/2012    Sees Dr Perlie Gold and they have discussed a bladder tack but so far she declines    Patient Active Problem List   Diagnosis Date Noted   Back pain 12/13/2019   Rheumatoid arthritis ([place]) 12/02/2019   Anxiety and depression 08/01/2019   Arthralgia 05/04/2019   OSA (obstructive sleep apnea) 05/03/2019   Thyroiditis 12/20/2018   Muscle cramp 12/20/2018   Eustachian tube dysfunction 07/27/2017   Headache 11/25/2015   Acute bacterial sinusitis 03/26/2015   Hematuria 04/09/2014   Hyperglycemia 04/06/2014   Diarrhea 09/21/2013   Hyperparathyroidism, primary ([place]) 07/12/2013   Dyspnea 12/28/2012   Hypercalcemia 12/16/2012   H/O tobacco use, presenting hazards to health 11/29/2012   Overactive bladder 05/31/2012   Elevated LFTs 05/31/2012   Obesity (BMI 30-39.9) 05/31/2012   Allergic rhinitis 05/31/2012   Hoarseness 02/04/2012   Preventative health care 02/04/2012   SUI (stress urinary incontinence, female) 02/04/2012   Migraine    GERD (gastroesophageal reflux disease)    Rotator cuff syndrome of right shoulder 06/11/2011   Atypical chest pain 01/03/2011   Hypertension 01/03/2011   Hyperlipidemia 01/03/2011   KNEE PAIN, BILATERAL 09/28/2008   ANSERINE BURSITIS 09/28/2008   CAVUS DEFORMITY OF FOOT, ACQUIRED 09/28/2008    Past Surgical History:  Procedure Laterality Date   BREAST EXCISIONAL BIOPSY Left    Age 48 Fibroadenoma   BREAST SURGERY     left breast excision of adenoma   btl  COLONOSCOPY  07/2020   PARATHYROIDECTOMY Right 08/12/2013   Procedure: right inferior frozen section PARATHYROIDECTOMY;  Surgeon: Earnstine Regal, MD;  Location: [facility];  Service: General;  Laterality: Right;   PELVIC LAPAROSCOPY  03/2004   TOTAL SHOULDER REPLACEMENT Right 01/2019   TUBAL LIGATION     WISDOM TOOTH EXTRACTION  30 yrs ago     OB History   No obstetric history on file.     Family History  Problem Relation Age of Onset   Hypertension Mother 25       alive   Renal  cancer Mother        renal   Hyperlipidemia Mother    Osteoporosis Mother        humerus    Macular degeneration Mother    Tremor Mother    Parkinson's disease Mother    Hypertension Father 68       alive   Atrial fibrillation Father    Colon cancer Father        colon   Hyperlipidemia Father    Stroke Father    Alzheimer's disease Father    Hypertension Brother 61       alive   Tremor Brother    Hypertension Sister    Lung cancer Maternal Grandfather        lung/ smoker   Emphysema Maternal Grandfather    Heart disease Paternal Grandmother    Other Paternal Grandfather        black lung   Heart disease Paternal Grandfather    Asthma Son    Eczema Son    Healthy Son    Healthy Son    Heart disease Paternal Aunt    Heart disease Paternal Uncle     Social History   Tobacco Use   Smoking status: Former    Packs/day: 1.00    Years: 20.00    Pack years: 20.00    Types: Cigarettes    Quit date: 03/10/2005    Years since quitting: 15.6   Smokeless tobacco: Never  Vaping Use   Vaping Use: Never used  Substance Use Topics   Alcohol use: Not Currently   Drug use: No    Home Medications Prior to Admission medications   Medication Sig Start Date End Date Taking? Authorizing Provider  lidocaine (LIDODERM) 5 % Place 1 patch onto the skin daily. Remove & Discard patch within 12 hours or as directed by MD 11/01/20  Yes Tacy Learn, PA-C  ALPRAZolam (XANAX) 0.25 MG tablet alprazolam 0.25 mg tablet  TAKE 1 TABLET BY MOUTH 2 TIMES DAILY AS NEEDED FOR ANXIETY. Patient not taking: Reported on 11/01/2020    [provider]  aspirin 325 MG tablet Take 325 mg by mouth daily.    [provider]  atorvastatin (LIPITOR) 40 MG tablet Take 1 tablet (40 mg total) by mouth daily. 06/21/20   [name], MD  Cholecalciferol 100 MCG (4000 UT) CAPS Vitamin D3 100 mcg (4,000 unit) capsule  Take by oral route.    [provider]  Co-Enzyme Q-10 30 MG CAPS  Take 100 mg by mouth at bedtime.    [provider]  DOCOSAHEXAENOIC ACID PO Take 1 tablet by mouth daily.    [provider]  escitalopram (LEXAPRO) 10 MG tablet TAKE 1 TABLET BY MOUTH EVERY DAY 09/04/20   [name], MD  furosemide (LASIX) 20 MG tablet Take 1 tablet (20 mg total) by mouth daily. 06/21/20   Charlett Blake,  Bonnita Levan, MD  Ginger, Zingiber officinalis, (GINGER PO) Take by mouth.    [provider]  irbesartan (AVAPRO) 150 MG tablet TAKE 1 TABLET BY MOUTH EVERY DAY 09/04/20   [name], MD  KRILL OIL PO krill oil    [provider]  lactobacillus acidophilus (BACID) TABS tablet Take 2 tablets by mouth 3 (three) times daily.    [provider]  leflunomide (ARAVA) 20 MG tablet Take 1 tablet (20 mg total) by mouth daily. 08/31/20   Ofilia Neas, PA-C  levothyroxine (SYNTHROID) 75 MCG tablet Take 1 tablet (75 mcg total) by mouth daily before breakfast. 02/07/20   [name], MD  metoprolol succinate (TOPROL-XL) 25 MG 24 hr tablet Take 1 tablet (25 mg total) by mouth daily. 06/21/20   [name], MD  Omega-3 Fatty Acids (OMEGA 3 PO) Take by mouth.    [provider]  omeprazole (PRILOSEC) 40 MG capsule TAKE 1 CAPSULE BY MOUTH EVERY DAY 06/04/20   [name], MD  OVER THE COUNTER MEDICATION Take 1 capsule by mouth daily. Mega red- 1 capsule daily    [provider]  TART CHERRY PO Take by mouth.    [provider]  Trospium Chloride 60 MG CP24 trospium ER 60 mg capsule,extended release 24 hr  TAKE 1 CAPSULE BY MOUTH EVERY DAY    [provider]  TURMERIC PO Take 1 tablet by mouth 2 (two) times daily.    [provider]  Upadacitinib ER (RINVOQ) 15 MG TB24 Take 15 mg by mouth daily. 10/03/20   Ofilia Neas, PA-C  verapamil (CALAN-SR) 120 MG CR tablet TAKE 1 TABLET BY MOUTH EVERYDAY AT BEDTIME 09/04/20   [name], MD    Allergies    Cefdinir, Doxycycline, Codeine, Penicillins,  Simvastatin, and Other  Review of Systems   Review of Systems  Constitutional:  Negative for chills, diaphoresis and fever.  Respiratory:  Negative for cough and shortness of breath.   Cardiovascular:  Positive for chest pain. Negative for palpitations and leg swelling.  Gastrointestinal:  Negative for abdominal pain, constipation, diarrhea, nausea and vomiting.  Genitourinary:  Negative for difficulty urinating.  Musculoskeletal:  Negative for arthralgias and myalgias.  Skin:  Negative for rash and wound.  Allergic/Immunologic: Positive for immunocompromised state.  Neurological:  Negative for weakness, numbness and headaches.  Hematological:  Negative for adenopathy.  Psychiatric/Behavioral:  Negative for confusion.   All other systems reviewed and are negative.  Physical Exam Updated Vital Signs BP (!) 166/88   Pulse 66   Temp 98.5 F (36.9 C) (Oral)   Resp 20   Ht '5\' 5"'$  (1.651 m)   Wt 80.3 kg   LMP 04/01/2011   SpO2 96%   BMI 29.45 kg/m   Physical Exam Vitals and nursing note reviewed.  Constitutional:      General: She is not in acute distress.    Appearance: She is well-developed. She is not diaphoretic.  HENT:     Head: Normocephalic and atraumatic.  Cardiovascular:     Rate and Rhythm: Normal rate and regular rhythm.     Pulses: Normal pulses.     Heart sounds: Normal heart sounds.  Pulmonary:     Effort: Pulmonary effort is normal.     Breath sounds: Normal breath sounds.  Chest:     Chest wall: No tenderness.  Abdominal:     Palpations: Abdomen is soft.     Tenderness: There is  no abdominal tenderness.  Musculoskeletal:        General: No tenderness.     Cervical back: No tenderness or bony tenderness.     Thoracic back: No tenderness or bony tenderness.     Right lower leg: No edema.     Left lower leg: No edema.  Skin:    General: Skin is warm and dry.     Findings: No erythema or rash.  Neurological:     Mental Status: She is alert and oriented  to person, place, and time.  Psychiatric:        Behavior: Behavior normal.    ED Results / Procedures / Treatments   Labs (all labs ordered are listed, but only abnormal results are displayed) Labs Reviewed  COMPREHENSIVE METABOLIC PANEL - Abnormal; Notable for the following components:      Result Value   Glucose, Bld 105 (*)    Total Protein 8.6 (*)    All other components within normal limits  CBC WITH DIFFERENTIAL/PLATELET  LIPASE, BLOOD  URINALYSIS, ROUTINE W REFLEX MICROSCOPIC    EKG None  Radiology CT Angio Chest PE W/Cm &/Or Wo Cm  Result Date: 11/01/2020 CLINICAL DATA:  Posterior right chest pain. EXAM: CT ANGIOGRAPHY CHEST WITH CONTRAST TECHNIQUE: Multidetector CT imaging of the chest was performed using the standard protocol during bolus administration of intravenous contrast. Multiplanar CT image reconstructions and MIPs were obtained to evaluate the vascular anatomy. CONTRAST:  142m OMNIPAQUE IOHEXOL 350 MG/ML SOLN COMPARISON:  08/17/2019 high-resolution chest CT. FINDINGS: Cardiovascular: The heart size is normal. No substantial pericardial effusion. Mild atherosclerotic calcification is noted in the wall of the thoracic aorta. There is no filling defect within the opacified pulmonary arteries to suggest the presence of an acute pulmonary embolus. Mediastinum/Nodes: No mediastinal lymphadenopathy. There is no hilar lymphadenopathy. The esophagus has normal imaging features. There is no axillary lymphadenopathy. Lungs/Pleura: 3 mm left lower lobe nodule on 44/5 was not definitely seen on prior CT. No other suspicious pulmonary nodule or mass. No focal airspace consolidation. No pleural effusion. Upper Abdomen: Unremarkable. Musculoskeletal: Old left rib fracture. No worrisome lytic or sclerotic osseous abnormality. Status post right shoulder replacement. Review of the MIP images confirms the above findings. IMPRESSION: 1. No CT evidence for acute pulmonary embolus. 2. No acute  findings in the chest. 3. 3 mm left lower lobe pulmonary nodule, not definitely seen on prior CT. No follow-up needed if patient is low-risk. Non-contrast chest CT can be considered in 12 months if patient is high-risk. This recommendation follows the consensus statement: Guidelines for Management of Incidental Pulmonary Nodules Detected on CT Images: From the Fleischner Society 2017; Radiology 2017; 284:228-243. 4. Aortic Atherosclerosis (ICD10-I70.0). Electronically Signed   By: EMisty StanleyM.D.   On: 11/01/2020 13:07    Procedures Procedures   Medications Ordered in ED Medications  lidocaine (LIDODERM) 5 % 1 patch (has no administration in time range)  iohexol (OMNIPAQUE) 350 MG/ML injection 100 mL (100 mLs Intravenous Contrast Given 11/01/20 1221)    ED Course  I have reviewed the triage vital signs and the nursing notes.  Pertinent labs & imaging results that were available during my care of the patient were reviewed by me and considered in my medical decision making (see chart for details).  Clinical Course as of 11/01/20 1332  Thu Aug 25, [id number] old female sent by PCP office with complaint of pain deep to the right scapula, worse with deep inspiration. Pain is not  reproduced with palpation or range of motion. CT is negative for PE or other acute pathology.  CT does show 3 mm left lung nodule, discussed with patient, based on recommendations, no further work-up needed at this time. EKG reviewed by Dr. Festus Barren, ER attending, no acute findings. Labs reassuring including CBC, CMP, lipase.  Recommend Lidoderm patch and Voltaren to area and recheck with PCP if pain continues. [LM]    Clinical Course User Index [LM] Roque Lias   MDM Rules/Calculators/A&P                           Final Clinical Impression(s) / ED Diagnoses Final diagnoses:  Pleuritic pain    Rx / DC Orders ED Discharge Orders          Ordered    lidocaine (LIDODERM) 5 %  Every 24 hours         11/01/20 1330             Tacy Learn, PA-C 11/01/20 1332    Wyvonnia Dusky, MD 11/01/20 1736

## 2020-11-02 NOTE — Progress Notes (Signed)
Office Visit Note  Patient: Sandra [name]             Date of Birth: [birth year]           MRN: [id number]             PCP: [name], MD Referring: [name], MD Visit Date: 11/16/2020 Occupation: @GUAROCC @  Subjective:  Medication monitoring   History of Present Illness: Sandra [name] is a 60 y.o. female with history of seronegative rheumatoid arthritis and osteoarthritis.  She is taking rinvoq 15 mg 1 tablet by mouth daily and arava 20 mg daily.  She is tolerating both medications without any side effects.  She has not had any flares since starting on Rinvoq.  She continues to have pain in both hands and both feet but denies any joint swelling currently.  The discomfort in her feet is most severe at night.  She has been taking Advil on a daily basis for pain relief.  She states that her knee joint pain has improved significantly.  She is also not having any discomfort in her elbows or shoulders currently. She denies any recent infections. She had a lipid panel obtained on 11/08/2020.  Her PCP increase the dose of Lipitor today to 60 mg daily.  She denies any other new questions or concerns. She remains under tremendous amount of stress since her son with leukemia recently found out that he has relapsed.  He is currently going through chemotherapy and will be scheduled for another bone marrow biopsy in several weeks.     Activities of Daily Living:  Patient reports morning stiffness for 2 hours.   Patient Reports nocturnal pain.  Difficulty dressing/grooming: Denies Difficulty climbing stairs: Denies Difficulty getting out of chair: Denies Difficulty using hands for taps, buttons, cutlery, and/or writing: Reports  Review of Systems  Constitutional:  Positive for fatigue.  HENT:  Positive for mouth dryness. Negative for mouth sores and nose dryness.   Eyes:  Negative for pain, itching and dryness.  Respiratory:  Negative for shortness of breath and difficulty  breathing.   Cardiovascular:  Negative for chest pain and palpitations.  Gastrointestinal:  Negative for blood in stool, constipation and diarrhea.  Endocrine: Negative for increased urination.  Genitourinary:  Negative for difficulty urinating.  Musculoskeletal:  Positive for joint pain, joint pain, joint swelling and morning stiffness. Negative for myalgias, muscle tenderness and myalgias.  Skin:  Negative for color change, rash and redness.  Allergic/Immunologic: Positive for susceptible to infections.  Neurological:  Negative for dizziness, numbness, headaches, memory loss and weakness.  Hematological:  Positive for bruising/bleeding tendency.  Psychiatric/Behavioral:  Negative for confusion.    PMFS History:  Patient Active Problem List   Diagnosis Date Noted   Back pain 12/13/2019   Rheumatoid arthritis ([place]) 12/02/2019   Anxiety and depression 08/01/2019   Arthralgia 05/04/2019   OSA (obstructive sleep apnea) 05/03/2019   Thyroiditis 12/20/2018   Muscle cramp 12/20/2018   Eustachian tube dysfunction 07/27/2017   Headache 11/25/2015   Acute bacterial sinusitis 03/26/2015   Hematuria 04/09/2014   Hyperglycemia 04/06/2014   Diarrhea 09/21/2013   Hyperparathyroidism, primary ([place]) 07/12/2013   Dyspnea 12/28/2012   Hypercalcemia 12/16/2012   H/O tobacco use, presenting hazards to health 11/29/2012   Overactive bladder 05/31/2012   Elevated LFTs 05/31/2012   Obesity (BMI 30-39.9) 05/31/2012   Allergic rhinitis 05/31/2012   Hoarseness 02/04/2012   Preventative health care 02/04/2012   SUI (stress urinary incontinence, female)  02/04/2012   Migraine    GERD (gastroesophageal reflux disease)    Rotator cuff syndrome of right shoulder 06/11/2011   Atypical chest pain 01/03/2011   Hypertension 01/03/2011   Hyperlipidemia 01/03/2011   KNEE PAIN, BILATERAL 09/28/2008   ANSERINE BURSITIS 09/28/2008   CAVUS DEFORMITY OF FOOT, ACQUIRED 09/28/2008    Past Medical History:   Diagnosis Date   Allergic rhinitis 05/31/2012   Chest pain    PT [facility] CARDIOLOGIST DR. Burt Knack - STRESS ECHO DONE NEGATIVE - NO PROBLEM SINCE   Chicken pox as achild   Cough 11/29/2012   RESOLVED - IT WAS CAUSED BY LISINOPRIL   Elevated LFTs 05/31/2012   Eustachian tube dysfunction 07/27/2017   GERD (gastroesophageal reflux disease)    H/O tobacco use, presenting hazards to health 11/29/2012   Smoked 1 1/2 ppd for 20 years quit in roughtly 2007   Headache 11/25/2015   Hematuria 04/09/2014   Hoarseness 02/04/2012   RESOLVED - THOUGHT TO HAVE BEEN ALLERY RELATED   Hyperglycemia 04/06/2014   Hyperlipidemia    Hyperparathyroidism ([place])    Hypertension    Obesity, unspecified 05/31/2012   Ocular migraine    Overactive bladder 05/31/2012   Preventative health care 02/04/2012   PTSD (post-traumatic stress disorder)    Rapid heart beat    PT FEELS PROB RELATED TO HYPER PARATHYROIDISM   Rheumatoid arthritis (HCC)    SUI (stress urinary incontinence, female) 02/04/2012   Sees Dr Perlie Gold and they have discussed a bladder tack but so far she declines    Family History  Problem Relation Age of Onset   Hypertension Mother 52       alive   Renal cancer Mother        renal   Hyperlipidemia Mother    Osteoporosis Mother        humerus    Macular degeneration Mother    Tremor Mother    Parkinson's disease Mother    Hypertension Father 71       alive   Atrial fibrillation Father    Colon cancer Father        colon   Hyperlipidemia Father    Stroke Father    Alzheimer's disease Father    Hypertension Brother 11       alive   Tremor Brother    Hypertension Sister    Lung cancer Maternal Grandfather        lung/ smoker   Emphysema Maternal Grandfather    Heart disease Paternal Grandmother    Other Paternal Grandfather        black lung   Heart disease Paternal Grandfather    Asthma Son    Eczema Son    Healthy Son    Healthy Son    Heart disease Paternal Aunt    Heart disease  Paternal Uncle    Past Surgical History:  Procedure Laterality Date   BREAST EXCISIONAL BIOPSY Left    Age 60 Fibroadenoma   BREAST SURGERY     left breast excision of adenoma   btl     COLONOSCOPY  07/2020   PARATHYROIDECTOMY Right 08/12/2013   Procedure: right inferior frozen section PARATHYROIDECTOMY;  Surgeon: Earnstine Regal, MD;  Location: [facility];  Service: General;  Laterality: Right;   PELVIC LAPAROSCOPY  03/2004   TOTAL SHOULDER REPLACEMENT Right 01/2019   TUBAL LIGATION     WISDOM TOOTH EXTRACTION  30 yrs ago   Social History   Social History Narrative   Not  on file   Immunization History  Administered Date(s) Administered   Hepatitis A, Adult 09/04/2016, 03/27/2017   Influenza Split 12/09/2011   Influenza,inj,Quad PF,6+ Mos 11/29/2012, 11/01/2013, 11/20/2015, 12/29/2017, 04/03/2019, 12/13/2019   PFIZER(Purple Top)SARS-COV-2 Vaccination 05/20/2019, 06/15/2019, 01/11/2020   Pneumococcal Polysaccharide-23 08/01/2019   Tdap 06/07/2015   Zoster Recombinat (Shingrix) 08/01/2019, 10/03/2020     Objective: Vital Signs: BP (!) 142/89 (BP Location: Left Arm, Patient Position: Sitting, Cuff Size: Normal)   Pulse 76   Ht $R'5\' 5"'eS$  (1.651 m)   Wt 183 lb (83 kg)   LMP 04/01/2011   BMI 30.45 kg/m    Physical Exam Vitals and nursing note reviewed.  Constitutional:      Appearance: She is well-developed.  HENT:     Head: Normocephalic and atraumatic.  Eyes:     Conjunctiva/sclera: Conjunctivae normal.  Pulmonary:     Effort: Pulmonary effort is normal.  Abdominal:     Palpations: Abdomen is soft.  Musculoskeletal:     Cervical back: Normal range of motion.  Skin:    General: Skin is warm and dry.     Capillary Refill: Capillary refill takes less than 2 seconds.  Neurological:     Mental Status: She is alert and oriented to person, place, and time.  Psychiatric:        Behavior: Behavior normal.     Musculoskeletal Exam: C-spine, thoracic spine, lumbar spine good  range of motion with no discomfort.  Shoulder joints, elbow joints, wrist joints, MCPs, PIPs, DIPs have good range of motion with no synovitis.  PIP and DIP thickening consistent with osteoarthritis of both hands.  Tenderness and some synovial thickening over the right third MCP joint noted.  No synovitis over MCP joints.  Complete fist formation bilaterally.  Hip joints have good range of motion with no discomfort.  No tenderness over trochanteric bursa bilaterally.  Knee joints have good range of motion with no warmth or effusion.  Ankle joints have good range of motion with no tenderness or inflammation.  Tenderness over several MTP joints as described below.  CDAI Exam: CDAI Score: 1.8  Patient Global: 6 mm; Provider Global: 2 mm Swollen: 0 ; Tender: 7  Joint Exam 11/16/2020      Right  Left  MCP 3   Tender     MTP 2   Tender   Tender  MTP 3   Tender   Tender  MTP 4   Tender   Tender     Investigation: No additional findings.  Imaging: CT Angio Chest PE W/Cm &/Or Wo Cm  Result Date: 11/01/2020 CLINICAL DATA:  Posterior right chest pain. EXAM: CT ANGIOGRAPHY CHEST WITH CONTRAST TECHNIQUE: Multidetector CT imaging of the chest was performed using the standard protocol during bolus administration of intravenous contrast. Multiplanar CT image reconstructions and MIPs were obtained to evaluate the vascular anatomy. CONTRAST:  153mL OMNIPAQUE IOHEXOL 350 MG/ML SOLN COMPARISON:  08/17/2019 high-resolution chest CT. FINDINGS: Cardiovascular: The heart size is normal. No substantial pericardial effusion. Mild atherosclerotic calcification is noted in the wall of the thoracic aorta. There is no filling defect within the opacified pulmonary arteries to suggest the presence of an acute pulmonary embolus. Mediastinum/Nodes: No mediastinal lymphadenopathy. There is no hilar lymphadenopathy. The esophagus has normal imaging features. There is no axillary lymphadenopathy. Lungs/Pleura: 3 mm left lower lobe  nodule on 44/5 was not definitely seen on prior CT. No other suspicious pulmonary nodule or mass. No focal airspace consolidation. No pleural effusion. Upper Abdomen:  Unremarkable. Musculoskeletal: Old left rib fracture. No worrisome lytic or sclerotic osseous abnormality. Status post right shoulder replacement. Review of the MIP images confirms the above findings. IMPRESSION: 1. No CT evidence for acute pulmonary embolus. 2. No acute findings in the chest. 3. 3 mm left lower lobe pulmonary nodule, not definitely seen on prior CT. No follow-up needed if patient is low-risk. Non-contrast chest CT can be considered in 12 months if patient is high-risk. This recommendation follows the consensus statement: Guidelines for Management of Incidental Pulmonary Nodules Detected on CT Images: From the Fleischner Society 2017; Radiology 2017; 284:228-243. 4. Aortic Atherosclerosis (ICD10-I70.0). Electronically Signed   By: Kennith Center M.D.   On: 11/01/2020 13:07    Recent Labs: Lab Results  Component Value Date   WBC 6.2 11/08/2020   HGB 12.6 11/08/2020   PLT 249 11/08/2020   NA 140 11/08/2020   K 4.7 11/08/2020   CL 104 11/08/2020   CO2 27 11/08/2020   GLUCOSE 85 11/08/2020   BUN 19 11/08/2020   CREATININE 0.61 11/08/2020   BILITOT 0.5 11/08/2020   ALKPHOS 88 11/01/2020   AST 26 11/08/2020   ALT 29 11/08/2020   PROT 6.9 11/08/2020   ALBUMIN 4.7 11/01/2020   CALCIUM 10.0 11/08/2020   GFRAA 117 08/31/2020   QFTBGOLDPLUS NEGATIVE 08/31/2020    Speciality Comments: Orencia stoped 10/04/20 Rinvoq started 10/11/20  Procedures:  No procedures performed Allergies: Cefdinir, Doxycycline, Codeine, Penicillins, Simvastatin, and Other   Assessment / Plan:     Visit Diagnoses: Rheumatoid arthritis of multiple sites with negative rheumatoid factor (HCC): She has no synovitis on examination today.  Overall she has started to notice significant clinical improvement since starting on Rinvoq 15 mg 1 tablet daily  after her last office visit on 10/02/2020.  She remains on Arava 20 mg 1 tablet daily.  She is tolerating both medications without any side effects.  She continues to have persistent pain in both hands and both feet likely due to underlying osteoarthritis.  She has been taking Advil on a daily basis for pain relief.  No inflammation was noted on examination today.  Overall she has noticed less pain in both shoulders, both elbows, and both knee joints on combination therapy.  We will allow him to have more time to see the true efficacy.  We would like to give him both a full 12 weeks to evaluate for maximum improvement.  She will remain on combination therapy as prescribed.  She will follow-up in the office on 12/24/2020.  High risk medication use - Arava 20 mg 1 tablet by mouth daily and Rinvoq 15 mg 1 tablet by mouth daily.  Rinvoq added on at the end of July 2022.  CBC and CMP updated on 11/08/20. She is due to update lab work in December and every 3 months to monitor for drug toxicity. Lipid panel updated on 11/08/20.  Her PCP increased the dose of Lipitor to 60 mg daily starting today.  TB gold negative on 08/31/20.  Previous therapy:  MTX d/c due to increased joint pain. Humira -September to December 2021. Enbrel-injection site reactions-January 2022-April 2022 Orencia-inadequate response & injection site reactions: April-July 2022.  She is aware of the black box warning associated with Jak inhibitors including increased risk for malignancy and MACE.  She has not had any recent infections.  Discussed the importance of holding Rinvoq and Arava if she develops signs or symptoms of an infection and to resume once infection is completely cleared.  Osteopenia of multiple sites - DEXA updated on 08/12/19: The BMD measured at Forearm Radius 33% is 0.751 g/cm2 with a T-score of -1.4.  She is taking vitamin D 4000 units daily.  Status post total shoulder replacement, right  Primary osteoarthritis of both hands:  She has PIP and DIP thickening consistent with osteoarthritis of both hands.  No inflammation was noted on examination today.  She was able to make a complete fist bilaterally.  She has been taking Advil on a daily basis for pain relief.  She remains on omega-3, ginger, tart cherry, and turmeric for the natural anti-inflammatory properties.  Discussed the importance of joint protection and muscle strengthening.  Primary osteoarthritis of both feet: She continues to have chronic pain in both feet.  Her discomfort is most severe at night.  She has been taking Advil on a daily basis for pain relief.  She has tenderness over the second through fourth MTP joints but no synovitis was noted.  She will continue to take the natural anti-inflammatories as previously recommended.  We also discussed the importance of wearing proper fitting shoes.  Other medical conditions are listed as follows:  Hyperparathyroidism, primary ([place])  Hashimoto's thyroiditis  Anxiety and depression  Essential hypertension  History of gastroesophageal reflux (GERD)  History of hyperlipidemia  Hx of migraines  History of miscarriage  Former smoker  Orders: No orders of the defined types were placed in this encounter.  No orders of the defined types were placed in this encounter.     Follow-Up Instructions: Return for Rheumatoid arthritis, Osteoarthritis.   Ofilia Neas, PA-C  Note - This record has been created using Dragon software.  Chart creation errors have been sought, but may not always  have been located. Such creation errors do not reflect on  the standard of medical care.

## 2020-11-08 ENCOUNTER — Other Ambulatory Visit: Payer: Self-pay | Admitting: *Deleted

## 2020-11-08 DIAGNOSIS — Z79899 Other long term (current) drug therapy: Secondary | ICD-10-CM

## 2020-11-08 LAB — COMPLETE METABOLIC PANEL WITH GFR
AG Ratio: 1.8 (calc) (ref 1.0–2.5)
ALT: 29 U/L (ref 6–29)
AST: 26 U/L (ref 10–35)
Albumin: 4.4 g/dL (ref 3.6–5.1)
Alkaline phosphatase (APISO): 82 U/L (ref 37–153)
BUN: 19 mg/dL (ref 7–25)
CO2: 27 mmol/L (ref 20–32)
Calcium: 10 mg/dL (ref 8.6–10.4)
Chloride: 104 mmol/L (ref 98–110)
Creat: 0.61 mg/dL (ref 0.50–1.03)
Globulin: 2.5 g/dL (calc) (ref 1.9–3.7)
Glucose, Bld: 85 mg/dL (ref 65–99)
Potassium: 4.7 mmol/L (ref 3.5–5.3)
Sodium: 140 mmol/L (ref 135–146)
Total Bilirubin: 0.5 mg/dL (ref 0.2–1.2)
Total Protein: 6.9 g/dL (ref 6.1–8.1)
eGFR: 104 mL/min/{1.73_m2} (ref >=60)

## 2020-11-08 LAB — LIPID PANEL
Cholesterol: 258 mg/dL — ABNORMAL HIGH (ref <200)
HDL: 71 mg/dL (ref >=50)
LDL Cholesterol (Calc): 163 mg/dL (calc) — ABNORMAL HIGH
Non-HDL Cholesterol (Calc): 187 mg/dL (calc) — ABNORMAL HIGH (ref <130)
Total CHOL/HDL Ratio: 3.6 (calc) (ref <5.0)
Triglycerides: 118 mg/dL (ref <150)

## 2020-11-08 LAB — CBC WITH DIFFERENTIAL/PLATELET
Absolute Monocytes: 601 cells/uL (ref 200–950)
Basophils Absolute: 31 cells/uL (ref 0–200)
Basophils Relative: 0.5 %
Eosinophils Absolute: 31 cells/uL (ref 15–500)
Eosinophils Relative: 0.5 %
HCT: 39.3 % (ref 35.0–45.0)
Hemoglobin: 12.6 g/dL (ref 11.7–15.5)
Lymphs Abs: 3435 cells/uL (ref 850–3900)
MCH: 28.6 pg (ref 27.0–33.0)
MCHC: 32.1 g/dL (ref 32.0–36.0)
MCV: 89.3 fL (ref 80.0–100.0)
MPV: 11.1 fL (ref 7.5–12.5)
Monocytes Relative: 9.7 %
Neutro Abs: 2102 cells/uL (ref 1500–7800)
Neutrophils Relative %: 33.9 %
Platelets: 249 10*3/uL (ref 140–400)
RBC: 4.4 10*6/uL (ref 3.80–5.10)
RDW: 12.9 % (ref 11.0–15.0)
Total Lymphocyte: 55.4 %
WBC: 6.2 10*3/uL (ref 3.8–10.8)

## 2020-11-09 NOTE — Progress Notes (Signed)
Total cholesterol is elevated-258.  LDL is elevated-163.  She continues to take lipitor as prescribed.  Please forward lab results to her PCP.    CBC and CMP WNL.

## 2020-11-13 ENCOUNTER — Ambulatory Visit: Payer: No Typology Code available for payment source | Admitting: Family Medicine

## 2020-11-15 ENCOUNTER — Other Ambulatory Visit: Payer: Self-pay | Admitting: Physician Assistant

## 2020-11-15 DIAGNOSIS — M0609 Rheumatoid arthritis without rheumatoid factor, multiple sites: Secondary | ICD-10-CM

## 2020-11-15 NOTE — Telephone Encounter (Signed)
Next Visit: 11/16/2020  Last Visit: 10/02/2020  Last Fill: 10/03/2020  [id number] arthritis of multiple sites with negative rheumatoid factor  Current Dose per office note 10/02/2020:  Rinvoq 15 mg daily  Labs: CBC and CMP WNL. Total cholesterol is elevated-258.  LDL is elevated-163.   TB Gold: 08/31/2020 neg   Okay to refill Rinvoq?

## 2020-11-15 NOTE — Telephone Encounter (Signed)
CBC and CMP WNL on 11/08/20.

## 2020-11-16 ENCOUNTER — Encounter: Payer: Self-pay | Admitting: Physician Assistant

## 2020-11-16 ENCOUNTER — Other Ambulatory Visit: Payer: Self-pay

## 2020-11-16 ENCOUNTER — Ambulatory Visit (INDEPENDENT_AMBULATORY_CARE_PROVIDER_SITE_OTHER): Payer: 59 | Admitting: Physician Assistant

## 2020-11-16 ENCOUNTER — Other Ambulatory Visit: Payer: Self-pay | Admitting: *Deleted

## 2020-11-16 VITALS — BP 142/89 | HR 76 | Ht 65.0 in | Wt 183.0 lb

## 2020-11-16 DIAGNOSIS — Z8639 Personal history of other endocrine, nutritional and metabolic disease: Secondary | ICD-10-CM

## 2020-11-16 DIAGNOSIS — M19072 Primary osteoarthritis, left ankle and foot: Secondary | ICD-10-CM

## 2020-11-16 DIAGNOSIS — M8589 Other specified disorders of bone density and structure, multiple sites: Secondary | ICD-10-CM | POA: Diagnosis not present

## 2020-11-16 DIAGNOSIS — I10 Essential (primary) hypertension: Secondary | ICD-10-CM

## 2020-11-16 DIAGNOSIS — M0609 Rheumatoid arthritis without rheumatoid factor, multiple sites: Secondary | ICD-10-CM | POA: Diagnosis not present

## 2020-11-16 DIAGNOSIS — Z96611 Presence of right artificial shoulder joint: Secondary | ICD-10-CM | POA: Diagnosis not present

## 2020-11-16 DIAGNOSIS — M19071 Primary osteoarthritis, right ankle and foot: Secondary | ICD-10-CM

## 2020-11-16 DIAGNOSIS — Z87891 Personal history of nicotine dependence: Secondary | ICD-10-CM

## 2020-11-16 DIAGNOSIS — Z8759 Personal history of other complications of pregnancy, childbirth and the puerperium: Secondary | ICD-10-CM

## 2020-11-16 DIAGNOSIS — E210 Primary hyperparathyroidism: Secondary | ICD-10-CM

## 2020-11-16 DIAGNOSIS — M19042 Primary osteoarthritis, left hand: Secondary | ICD-10-CM

## 2020-11-16 DIAGNOSIS — Z8669 Personal history of other diseases of the nervous system and sense organs: Secondary | ICD-10-CM

## 2020-11-16 DIAGNOSIS — Z79899 Other long term (current) drug therapy: Secondary | ICD-10-CM

## 2020-11-16 DIAGNOSIS — E063 Autoimmune thyroiditis: Secondary | ICD-10-CM

## 2020-11-16 DIAGNOSIS — F32A Depression, unspecified: Secondary | ICD-10-CM

## 2020-11-16 DIAGNOSIS — F419 Anxiety disorder, unspecified: Secondary | ICD-10-CM

## 2020-11-16 DIAGNOSIS — M19041 Primary osteoarthritis, right hand: Secondary | ICD-10-CM

## 2020-11-16 DIAGNOSIS — Z8719 Personal history of other diseases of the digestive system: Secondary | ICD-10-CM

## 2020-11-16 MED ORDER — ATORVASTATIN CALCIUM 40 MG PO TABS: 60.0000 mg | ORAL_TABLET | Freq: Every day | ORAL | 0 refills | Status: DC

## 2020-11-16 NOTE — Patient Instructions (Signed)
Standing Labs We placed an order today for your standing lab work.   Please have your standing labs drawn in December and every 3 months    If possible, please have your labs drawn 2 weeks prior to your appointment so that the provider can discuss your results at your appointment.  Please note that you may see your imaging and lab results in MyChart before we have reviewed them. We may be awaiting multiple results to interpret others before contacting you. Please allow our office up to 72 hours to thoroughly review all of the results before contacting the office for clarification of your results.  We have open lab daily: Monday through Thursday from 1:30-4:30 PM and Friday from 1:30-4:00 PM at the office of Dr. Shaili Deveshwar, [facility].   Please be advised, all patients with office appointments requiring lab work will take precedent over walk-in lab work.  If possible, please come for your lab work on Monday and Friday afternoons, as you may experience shorter wait times. The office is located at [address], [address], [place], [place] [address] No appointment is necessary.   Labs are drawn by [facility]. Please bring your co-pay at the time of your lab draw.  You may receive a bill from [facility] for your lab work.  If you wish to have your labs drawn at another location, please call the office 24 hours in advance to send orders.  If you have any questions regarding directions or hours of operation,  please call [phone].   As a reminder, please drink plenty of water prior to coming for your lab work. Thanks!  

## 2020-11-19 ENCOUNTER — Telehealth: Payer: Self-pay | Admitting: *Deleted

## 2020-11-19 NOTE — Telephone Encounter (Signed)
Attempted to contact the patient and left message to advise patient her FMLA paperwork has been faxed,. Patient advised her  copy is ready for pick up.

## 2020-12-02 ENCOUNTER — Other Ambulatory Visit: Payer: Self-pay | Admitting: Physician Assistant

## 2020-12-03 NOTE — Telephone Encounter (Signed)
Next Visit: 12/24/2020  Last Visit: 11/16/2020  Last Fill: 08/31/2020  DX: Rheumatoid arthritis of multiple sites with negative rheumatoid factor   Current Dose per office note 11/16/2020: Arava 20 mg 1 tablet by mouth daily   Labs: 11/08/2020 CBC and CMP WNL.   Okay to refill Arava?

## 2020-12-10 NOTE — Progress Notes (Deleted)
Office Visit Note  Patient: Sandra [name]             Date of Birth: 11/08/1960           MRN: [id number]             PCP: [name], MD Referring: [name], MD Visit Date: 12/24/2020 Occupation: @GUAROCC @  Subjective:  No chief complaint on file.   History of Present Illness: Sandra [name] is a 60 y.o. female ***   Activities of Daily Living:  Patient reports morning stiffness for *** {minute/hour:19697}.   Patient {ACTIONS;DENIES/REPORTS:21021675::"Denies"} nocturnal pain.  Difficulty dressing/grooming: {ACTIONS;DENIES/REPORTS:21021675::"Denies"} Difficulty climbing stairs: {ACTIONS;DENIES/REPORTS:21021675::"Denies"} Difficulty getting out of chair: {ACTIONS;DENIES/REPORTS:21021675::"Denies"} Difficulty using hands for taps, buttons, cutlery, and/or writing: {ACTIONS;DENIES/REPORTS:21021675::"Denies"}  No Rheumatology ROS completed.   PMFS History:  Patient Active Problem List   Diagnosis Date Noted   Back pain 12/13/2019   Rheumatoid arthritis ([place]) 12/02/2019   Anxiety and depression 08/01/2019   Arthralgia 05/04/2019   OSA (obstructive sleep apnea) 05/03/2019   Thyroiditis 12/20/2018   Muscle cramp 12/20/2018   Eustachian tube dysfunction 07/27/2017   Headache 11/25/2015   Acute bacterial sinusitis 03/26/2015   Hematuria 04/09/2014   Hyperglycemia 04/06/2014   Diarrhea 09/21/2013   Hyperparathyroidism, primary ([place]) 07/12/2013   Dyspnea 12/28/2012   Hypercalcemia 12/16/2012   H/O tobacco use, presenting hazards to health 11/29/2012   Overactive bladder 05/31/2012   Elevated LFTs 05/31/2012   Obesity (BMI 30-39.9) 05/31/2012   Allergic rhinitis 05/31/2012   Hoarseness 02/04/2012   Preventative health care 02/04/2012   SUI (stress urinary incontinence, female) 02/04/2012   Migraine    GERD (gastroesophageal reflux disease)    Rotator cuff syndrome of right shoulder 06/11/2011   Atypical chest pain 01/03/2011   Hypertension 01/03/2011    Hyperlipidemia 01/03/2011   KNEE PAIN, BILATERAL 09/28/2008   ANSERINE BURSITIS 09/28/2008   CAVUS DEFORMITY OF FOOT, ACQUIRED 09/28/2008    Past Medical History:  Diagnosis Date   Allergic rhinitis 05/31/2012   Chest pain    PT SAW CARDIOLOGIST DR. Burt Knack - STRESS ECHO DONE NEGATIVE - NO PROBLEM SINCE   Chicken pox as achild   Cough 11/29/2012   RESOLVED - IT WAS CAUSED BY LISINOPRIL   Elevated LFTs 05/31/2012   Eustachian tube dysfunction 07/27/2017   GERD (gastroesophageal reflux disease)    H/O tobacco use, presenting hazards to health 11/29/2012   Smoked 1 1/2 ppd for 20 years quit in roughtly 2007   Headache 11/25/2015   Hematuria 04/09/2014   Hoarseness 02/04/2012   RESOLVED - THOUGHT TO HAVE BEEN ALLERY RELATED   Hyperglycemia 04/06/2014   Hyperlipidemia    Hyperparathyroidism ([place])    Hypertension    Obesity, unspecified 05/31/2012   Ocular migraine    Overactive bladder 05/31/2012   Preventative health care 02/04/2012   PTSD (post-traumatic stress disorder)    Rapid heart beat    PT FEELS PROB RELATED TO HYPER PARATHYROIDISM   Rheumatoid arthritis ([facility])    SUI (stress urinary incontinence, female) 02/04/2012   Sees Dr Perlie Gold and they have discussed a bladder tack but so far she declines    Family History  Problem Relation Age of Onset   Hypertension Mother 32       alive   Renal cancer Mother        renal   Hyperlipidemia Mother    Osteoporosis Mother        humerus    Macular degeneration Mother  Tremor Mother    Parkinson's disease Mother    Hypertension Father 30       alive   Atrial fibrillation Father    Colon cancer Father        colon   Hyperlipidemia Father    Stroke Father    Alzheimer's disease Father    Hypertension Brother 50       alive   Tremor Brother    Hypertension Sister    Lung cancer Maternal Grandfather        lung/ smoker   Emphysema Maternal Grandfather    Heart disease Paternal Grandmother    Other Paternal Grandfather         black lung   Heart disease Paternal Grandfather    Asthma Son    Eczema Son    Healthy Son    Healthy Son    Heart disease Paternal Aunt    Heart disease Paternal Uncle    Past Surgical History:  Procedure Laterality Date   BREAST EXCISIONAL BIOPSY Left    Age 77 Fibroadenoma   BREAST SURGERY     left breast excision of adenoma   btl     COLONOSCOPY  07/2020   PARATHYROIDECTOMY Right 08/12/2013   Procedure: right inferior frozen section PARATHYROIDECTOMY;  Surgeon: Earnstine Regal, MD;  Location: [facility];  Service: General;  Laterality: Right;   PELVIC LAPAROSCOPY  03/2004   TOTAL SHOULDER REPLACEMENT Right 01/2019   TUBAL LIGATION     WISDOM TOOTH EXTRACTION  30 yrs ago   Social History   Social History Narrative   Not on file   Immunization History  Administered Date(s) Administered   Hepatitis A, Adult 09/04/2016, 03/27/2017   Influenza Split 12/09/2011   Influenza,inj,Quad PF,6+ Mos 11/29/2012, 11/01/2013, 11/20/2015, 12/29/2017, 04/03/2019, 12/13/2019   PFIZER(Purple Top)SARS-COV-2 Vaccination 05/20/2019, 06/15/2019, 01/11/2020   Pneumococcal Polysaccharide-23 08/01/2019   Tdap 06/07/2015   Zoster Recombinat (Shingrix) 08/01/2019, 10/03/2020     Objective: Vital Signs: LMP 04/01/2011    Physical Exam   Musculoskeletal Exam: ***  CDAI Exam: CDAI Score: -- Patient Global: --; Provider Global: -- Swollen: --; Tender: -- Joint Exam 12/24/2020   No joint exam has been documented for this visit   There is currently no information documented on the homunculus. Go to the Rheumatology activity and complete the homunculus joint exam.  Investigation: No additional findings.  Imaging: No results found.  Recent Labs: Lab Results  Component Value Date   WBC 6.2 11/08/2020   HGB 12.6 11/08/2020   PLT 249 11/08/2020   NA 140 11/08/2020   K 4.7 11/08/2020   CL 104 11/08/2020   CO2 27 11/08/2020   GLUCOSE 85 11/08/2020   BUN 19 11/08/2020   CREATININE  0.61 11/08/2020   BILITOT 0.5 11/08/2020   ALKPHOS 88 11/01/2020   AST 26 11/08/2020   ALT 29 11/08/2020   PROT 6.9 11/08/2020   ALBUMIN 4.7 11/01/2020   CALCIUM 10.0 11/08/2020   GFRAA 117 08/31/2020   QFTBGOLDPLUS NEGATIVE 08/31/2020    Speciality Comments: Orencia stoped 10/04/20 Rinvoq started 10/11/20  Procedures:  No procedures performed Allergies: Cefdinir, Doxycycline, Codeine, Penicillins, Simvastatin, and Other   Assessment / Plan:     Visit Diagnoses: No diagnosis found.  Orders: No orders of the defined types were placed in this encounter.  No orders of the defined types were placed in this encounter.   Face-to-face time spent with patient was *** minutes. Greater than 50% of time was spent in  counseling and coordination of care.  Follow-Up Instructions: No follow-ups on file.   Earnestine Mealing, CMA  Note - This record has been created using Editor, commissioning.  Chart creation errors have been sought, but may not always  have been located. Such creation errors do not reflect on  the standard of medical care.

## 2020-12-18 ENCOUNTER — Other Ambulatory Visit: Payer: Self-pay

## 2020-12-18 MED ORDER — PREDNISONE 5 MG PO TABS: ORAL_TABLET | ORAL | 0 refills | Status: DC

## 2020-12-18 NOTE — Progress Notes (Signed)
Office Visit Note  Patient: Sandra [name]             Date of Birth: May 16, 1960           MRN: [id number]             PCP: [name], MD Referring: [name], MD Visit Date: 12/19/2020 Occupation: @GUAROCC @  Subjective:  Pain in multiple joints   History of Present Illness: Sandra [name] is a 60 y.o. female with history of seronegative rheumatoid arthritis and osteoarthritis.  She is taking Rinvoq 15 mg 1 tablet daily and Arava 20 mg 1 tablet daily.  She continues to tolerate both medications without any side effects.  She has not missed any doses recently.  She has not had any recent infections.  She states that starting 1 week ago she started to have a flare involving multiple joints including both wrist joints, both hands, left knee, and both feet.  She states the pain is most severe in her feet.  She has difficulty walking due to the severity of pain and has been experiencing a burning sensation on her soles at night.  She has been having to take ibuprofen 200 mg 4 tablets at bedtime to manage her nocturnal pain.  She continues to take tart cherry, ginger, omega-3, and turmeric as recommended.  She states that she has been under tremendous amount of stress caring for her son who has been hospitalized and undergoing chemotherapy.  According to the patient her son will hopefully be entering into a clinical trial in [place] starting in November so they will be relocating within the next couple of weeks to stay with family.  She feels that her stress level has caused this flare but overall feels that on [place] and Rinvoq have started to control her rheumatoid arthritis.  She does not want to make any medication changes at this time.  A prednisone taper starting at 20 mg tapering by 5 mg every 2 days was sent to the pharmacy yesterday but she has not picked up the prescription from the pharmacy. She resigned from her job on 11/19/20.     Activities of Daily Living:  Patient  reports morning stiffness for several hours.   Patient Reports nocturnal pain.  Difficulty dressing/grooming: Denies Difficulty climbing stairs: Denies Difficulty getting out of chair: Denies Difficulty using hands for taps, buttons, cutlery, and/or writing: Reports  Review of Systems  Constitutional:  Positive for fatigue.  HENT:  Positive for mouth dryness. Negative for mouth sores and nose dryness.   Eyes:  Negative for pain, itching and dryness.  Respiratory:  Negative for shortness of breath and difficulty breathing.   Cardiovascular:  Negative for chest pain and palpitations.  Gastrointestinal:  Negative for blood in stool, constipation and diarrhea.  Endocrine: Negative for increased urination.  Genitourinary:  Negative for difficulty urinating.  Musculoskeletal:  Positive for joint pain, joint pain, joint swelling, myalgias, morning stiffness, muscle tenderness and myalgias.  Skin:  Negative for color change, rash and redness.  Allergic/Immunologic: Positive for susceptible to infections.  Neurological:  Negative for dizziness, numbness, headaches, memory loss and weakness.  Hematological:  Positive for bruising/bleeding tendency.  Psychiatric/Behavioral:  Negative for confusion.    PMFS History:  Patient Active Problem List   Diagnosis Date Noted   Back pain 12/13/2019   Rheumatoid arthritis ([place]) 12/02/2019   Anxiety and depression 08/01/2019   Arthralgia 05/04/2019   OSA (obstructive sleep apnea) 05/03/2019   Thyroiditis 12/20/2018  Muscle cramp 12/20/2018   Eustachian tube dysfunction 07/27/2017   Headache 11/25/2015   Acute bacterial sinusitis 03/26/2015   Hematuria 04/09/2014   Hyperglycemia 04/06/2014   Diarrhea 09/21/2013   Hyperparathyroidism, primary ([place]) 07/12/2013   Dyspnea 12/28/2012   Hypercalcemia 12/16/2012   H/O tobacco use, presenting hazards to health 11/29/2012   Overactive bladder 05/31/2012   Elevated LFTs 05/31/2012   Obesity (BMI 30-39.9)  05/31/2012   Allergic rhinitis 05/31/2012   Hoarseness 02/04/2012   Preventative health care 02/04/2012   SUI (stress urinary incontinence, female) 02/04/2012   Migraine    GERD (gastroesophageal reflux disease)    Rotator cuff syndrome of right shoulder 06/11/2011   Atypical chest pain 01/03/2011   Hypertension 01/03/2011   Hyperlipidemia 01/03/2011   KNEE PAIN, BILATERAL 09/28/2008   ANSERINE BURSITIS 09/28/2008   CAVUS DEFORMITY OF FOOT, ACQUIRED 09/28/2008    Past Medical History:  Diagnosis Date   Allergic rhinitis 05/31/2012   Chest pain    PT SAW CARDIOLOGIST DR. Burt Knack - STRESS ECHO DONE NEGATIVE - NO PROBLEM SINCE   Chicken pox as achild   Cough 11/29/2012   RESOLVED - IT WAS CAUSED BY LISINOPRIL   Elevated LFTs 05/31/2012   Eustachian tube dysfunction 07/27/2017   GERD (gastroesophageal reflux disease)    H/O tobacco use, presenting hazards to health 11/29/2012   Smoked 1 1/2 ppd for 20 years quit in roughtly 2007   Headache 11/25/2015   Hematuria 04/09/2014   Hoarseness 02/04/2012   RESOLVED - THOUGHT TO HAVE BEEN ALLERY RELATED   Hyperglycemia 04/06/2014   Hyperlipidemia    Hyperparathyroidism ([place])    Hypertension    Obesity, unspecified 05/31/2012   Ocular migraine    Overactive bladder 05/31/2012   Preventative health care 02/04/2012   PTSD (post-traumatic stress disorder)    Rapid heart beat    PT FEELS PROB RELATED TO HYPER PARATHYROIDISM   Rheumatoid arthritis ([facility])    SUI (stress urinary incontinence, female) 02/04/2012   Sees Dr Perlie Gold and they have discussed a bladder tack but so far she declines    Family History  Problem Relation Age of Onset   Hypertension Mother 71       alive   Renal cancer Mother        renal   Hyperlipidemia Mother    Osteoporosis Mother        humerus    Macular degeneration Mother    Tremor Mother    Parkinson's disease Mother    Hypertension Father 71       alive   Atrial fibrillation Father    Colon cancer Father         colon   Hyperlipidemia Father    Stroke Father    Alzheimer's disease Father    Hypertension Brother 17       alive   Tremor Brother    Hypertension Sister    Lung cancer Maternal Grandfather        lung/ smoker   Emphysema Maternal Grandfather    Heart disease Paternal Grandmother    Other Paternal Grandfather        black lung   Heart disease Paternal Grandfather    Asthma Son    Eczema Son    Healthy Son    Healthy Son    Heart disease Paternal Aunt    Heart disease Paternal Uncle    Past Surgical History:  Procedure Laterality Date   BREAST EXCISIONAL BIOPSY Left    Age 40  Fibroadenoma   BREAST SURGERY     left breast excision of adenoma   btl     COLONOSCOPY  07/2020   PARATHYROIDECTOMY Right 08/12/2013   Procedure: right inferior frozen section PARATHYROIDECTOMY;  Surgeon: Earnstine Regal, MD;  Location: [facility];  Service: General;  Laterality: Right;   PELVIC LAPAROSCOPY  03/2004   TOTAL SHOULDER REPLACEMENT Right 01/2019   TUBAL LIGATION     WISDOM TOOTH EXTRACTION  30 yrs ago   Social History   Social History Narrative   Not on file   Immunization History  Administered Date(s) Administered   Hepatitis A, Adult 09/04/2016, 03/27/2017   Influenza Split 12/09/2011   Influenza,inj,Quad PF,6+ Mos 11/29/2012, 11/01/2013, 11/20/2015, 12/29/2017, 04/03/2019, 12/13/2019   PFIZER(Purple Top)SARS-COV-2 Vaccination 05/20/2019, 06/15/2019, 01/11/2020   Pneumococcal Polysaccharide-23 08/01/2019   Tdap 06/07/2015   Zoster Recombinat (Shingrix) 08/01/2019, 10/03/2020     Objective: Vital Signs: BP (!) 158/106 (BP Location: Left Arm, Patient Position: Sitting, Cuff Size: Normal)   Pulse 68   Ht 5\' 5"  (1.651 m)   Wt 185 lb (83.9 kg)   LMP 04/01/2011   BMI 30.79 kg/m    Physical Exam Vitals and nursing note reviewed.  Constitutional:      Appearance: She is well-developed.  HENT:     Head: Normocephalic and atraumatic.  Eyes:     Conjunctiva/sclera:  Conjunctivae normal.  Pulmonary:     Effort: Pulmonary effort is normal.  Abdominal:     Palpations: Abdomen is soft.  Musculoskeletal:     Cervical back: Normal range of motion.  Skin:    General: Skin is warm and dry.     Capillary Refill: Capillary refill takes less than 2 seconds.  Neurological:     Mental Status: She is alert and oriented to person, place, and time.  Psychiatric:        Behavior: Behavior normal.     Musculoskeletal Exam: C-spine, thoracic spine, lumbar spine good range of motion with no discomfort.  Right shoulder replacement has good range of motion.  Left shoulder has good range of motion with no discomfort or tenderness.  Elbow joints have good range of motion with no tenderness or inflammation.  Tenderness over both wrist joints.  Tenderness over the left CMC joint.  Tenderness over bilateral second and third MCPs and second PIP joints.  Tenderness and synovitis of the left fifth PIP joint noted.  PIP and DIP thickening consistent with osteoarthritis of both hands.  Hip joints have good range of motion with no discomfort.  Painful range of motion with fullness in the left knee.  Tenderness and warmth of both ankle joints especially the right ankle.  Tenderness of all MTP joints.  PIP and DIP thickening consistent with OA of both hands.  CDAI Exam: CDAI Score: -- Patient Global: --; Provider Global: -- Swollen: --; Tender: -- Joint Exam 12/19/2020   No joint exam has been documented for this visit   There is currently no information documented on the homunculus. Go to the Rheumatology activity and complete the homunculus joint exam.  Investigation: No additional findings.  Imaging: No results found.  Recent Labs: Lab Results  Component Value Date   WBC 6.2 11/08/2020   HGB 12.6 11/08/2020   PLT 249 11/08/2020   NA 140 11/08/2020   K 4.7 11/08/2020   CL 104 11/08/2020   CO2 27 11/08/2020   GLUCOSE 85 11/08/2020   BUN 19 11/08/2020   CREATININE  0.61 11/08/2020  BILITOT 0.5 11/08/2020   ALKPHOS 88 11/01/2020   AST 26 11/08/2020   ALT 29 11/08/2020   PROT 6.9 11/08/2020   ALBUMIN 4.7 11/01/2020   CALCIUM 10.0 11/08/2020   GFRAA 117 08/31/2020   QFTBGOLDPLUS NEGATIVE 08/31/2020    Speciality Comments: Orencia stoped 10/04/20 Rinvoq started 10/11/20  Procedures:  No procedures performed Allergies: Cefdinir, Doxycycline, Codeine, Penicillins, Simvastatin, and Other   Assessment / Plan:     Visit Diagnoses: Rheumatoid arthritis of multiple sites with negative rheumatoid factor ([place]): She has tenderness and inflammation involving multiple joints.  She started having a flare about 1 week ago which she attributes to being under tremendous amount of stress while her son goes through chemotherapy.  She has tenderness over both wrist joints, several MCP and PIP joints as described above, warmth in the left knee, and tenderness and warmth in both ankle joints.  She has been having severe pain in both feet and has tenderness of all MTP joints.  She has been experiencing significant pain while walking as well as a burning sensation on the plantar aspect of both feet at night.  She has been taking Rinvoq 15 mg 1 tablet by mouth daily and Arava 20 mg 1 tablet by mouth daily.  She is tolerating both medications and has not missed any doses recently.  No recent infections.  Overall she has found the combination to be effective at managing her rheumatoid arthritis but attributes the most recent flareup to all of the stress she has been under.  She will be rehabilitating to [facility] for a clinical trial for her son starting in November.  A prednisone taper starting at 20 mg tapering by 5 mg every week was sent to the pharmacy today.  She will remain on the current treatment regimen.  She does not want to make any medication changes at this time.  She was advised to notify us if she continues to have recurrent flares.  She will follow-up in the office in 3  months.  High risk medication use - Arava 20 mg 1 tablet by mouth daily and Rinvoq 15 mg 1 tablet by mouth daily.  Rinvoq added on at the end of July 2022.  CBC and CMP were drawn on 11/08/2020.  She will be due to update lab work in December and every 3 months to monitor for drug toxicity.  TB Gold negative on 08/31/2020 and will continue to be monitored yearly. Lipid panel updated on 11/08/20.  Her PCP increased the dose of Lipitor to 60 mg daily starting today.  TB gold negative on 08/31/20.  Previous therapy:  MTX d/c due to increased joint pain. Humira -September to December 2021. Enbrel-injection site reactions-January 2022-April 2022 Orencia-inadequate response & injection site reactions: April-July 2022. She is aware of the black box warning associated with Jak inhibitors including increased risk for malignancy and MACE.  She has not had any recent infections.  Discussed the importance of holding Rinvoq and Arava if she develops signs or symptoms of an infection and to resume once infection is completely cleared.  Osteopenia of multiple sites - DEXA updated on 08/12/19: The BMD measured at Forearm Radius 33% is 0.751 g/cm2 with a T-score of -1.4.  She is taking vitamin D 4000 units daily.  No falls or fractures.   Status post total shoulder replacement, right: Doing well.  She has good ROM with no tenderness.    Primary osteoarthritis of both hands: She has PIP and DIP thickening consistent with  OA of both hands.  Tenderness over the left CMC joint.  Primary osteoarthritis of both feet: PIP and DIP thickening consistent with OA of both feet.  Tenderness over both ankle joints. She has been experiencing severe pain in both feet due to underlying osteoarthritis and rheumatoid arthritis overlap.  She has warmth and tenderness of both ankle joints on examination today.  A prednisone taper sent to the pharmacy as discussed above.  Other medical conditions are listed as follows:    Hyperparathyroidism, primary ([place])  Hashimoto's thyroiditis  Anxiety and depression  Essential hypertension  History of gastroesophageal reflux (GERD)  History of hyperlipidemia  Hx of migraines  History of miscarriage  Orders: No orders of the defined types were placed in this encounter.  Meds ordered this encounter  Medications   predniSONE (DELTASONE) 5 MG tablet    Sig: Take 4 tablets by mouth daily x1wk, 3 tablets daily x1wk, 2 tablets daily x1wk, 1 tablet daily x1wk    Dispense:  70 tablet    Refill:  0     Follow-Up Instructions: Return in about 3 months (around 03/21/2021) for Rheumatoid arthritis, Osteoarthritis.   Ofilia Neas, PA-C  Note - This record has been created using Dragon software.  Chart creation errors have been sought, but may not always  have been located. Such creation errors do not reflect on  the standard of medical care.

## 2020-12-18 NOTE — Telephone Encounter (Signed)
Ok to send in prednisone 20 mg tapering by 5 mg every 2 days.

## 2020-12-18 NOTE — Telephone Encounter (Signed)
Patient called stating she is having pain in her hands, wrists, left knee, and feet especially with walking.  Patient scheduled an appointment for tomorrow 12/19/20 at 8:40 am with Lovena Le, but is also requesting prescription of Prednisone be sent to [facility] at [address] in [place].

## 2020-12-19 ENCOUNTER — Encounter: Payer: Self-pay | Admitting: Physician Assistant

## 2020-12-19 ENCOUNTER — Ambulatory Visit (INDEPENDENT_AMBULATORY_CARE_PROVIDER_SITE_OTHER): Payer: 59 | Admitting: Physician Assistant

## 2020-12-19 ENCOUNTER — Other Ambulatory Visit: Payer: Self-pay

## 2020-12-19 VITALS — BP 158/106 | HR 68 | Ht 65.0 in | Wt 185.0 lb

## 2020-12-19 DIAGNOSIS — M19071 Primary osteoarthritis, right ankle and foot: Secondary | ICD-10-CM

## 2020-12-19 DIAGNOSIS — Z96611 Presence of right artificial shoulder joint: Secondary | ICD-10-CM

## 2020-12-19 DIAGNOSIS — M0609 Rheumatoid arthritis without rheumatoid factor, multiple sites: Secondary | ICD-10-CM

## 2020-12-19 DIAGNOSIS — M8589 Other specified disorders of bone density and structure, multiple sites: Secondary | ICD-10-CM

## 2020-12-19 DIAGNOSIS — Z8759 Personal history of other complications of pregnancy, childbirth and the puerperium: Secondary | ICD-10-CM

## 2020-12-19 DIAGNOSIS — E063 Autoimmune thyroiditis: Secondary | ICD-10-CM

## 2020-12-19 DIAGNOSIS — Z79899 Other long term (current) drug therapy: Secondary | ICD-10-CM | POA: Diagnosis not present

## 2020-12-19 DIAGNOSIS — Z8669 Personal history of other diseases of the nervous system and sense organs: Secondary | ICD-10-CM

## 2020-12-19 DIAGNOSIS — M19041 Primary osteoarthritis, right hand: Secondary | ICD-10-CM

## 2020-12-19 DIAGNOSIS — I10 Essential (primary) hypertension: Secondary | ICD-10-CM

## 2020-12-19 DIAGNOSIS — Z8639 Personal history of other endocrine, nutritional and metabolic disease: Secondary | ICD-10-CM

## 2020-12-19 DIAGNOSIS — M19072 Primary osteoarthritis, left ankle and foot: Secondary | ICD-10-CM

## 2020-12-19 DIAGNOSIS — M19042 Primary osteoarthritis, left hand: Secondary | ICD-10-CM

## 2020-12-19 DIAGNOSIS — F419 Anxiety disorder, unspecified: Secondary | ICD-10-CM

## 2020-12-19 DIAGNOSIS — E210 Primary hyperparathyroidism: Secondary | ICD-10-CM

## 2020-12-19 DIAGNOSIS — F32A Depression, unspecified: Secondary | ICD-10-CM

## 2020-12-19 DIAGNOSIS — Z8719 Personal history of other diseases of the digestive system: Secondary | ICD-10-CM

## 2020-12-19 MED ORDER — PREDNISONE 5 MG PO TABS: ORAL_TABLET | ORAL | 0 refills | Status: DC

## 2020-12-19 NOTE — Patient Instructions (Signed)
Standing Labs We placed an order today for your standing lab work.   Please have your standing labs drawn in December and every 3 months    If possible, please have your labs drawn 2 weeks prior to your appointment so that the provider can discuss your results at your appointment.  Please note that you may see your imaging and lab results in MyChart before we have reviewed them. We may be awaiting multiple results to interpret others before contacting you. Please allow our office up to 72 hours to thoroughly review all of the results before contacting the office for clarification of your results.  We have open lab daily: Monday through Thursday from 1:30-4:30 PM and Friday from 1:30-4:00 PM at the office of Dr. Shaili Deveshwar, [facility].   Please be advised, all patients with office appointments requiring lab work will take precedent over walk-in lab work.  If possible, please come for your lab work on Monday and Friday afternoons, as you may experience shorter wait times. The office is located at [address], [address], [place], [place] [address] No appointment is necessary.   Labs are drawn by [facility]. Please bring your co-pay at the time of your lab draw.  You may receive a bill from [facility] for your lab work.  If you wish to have your labs drawn at another location, please call the office 24 hours in advance to send orders.  If you have any questions regarding directions or hours of operation,  please call [phone].   As a reminder, please drink plenty of water prior to coming for your lab work. Thanks!  

## 2020-12-24 ENCOUNTER — Ambulatory Visit: Payer: 59 | Admitting: Physician Assistant

## 2020-12-24 DIAGNOSIS — M0609 Rheumatoid arthritis without rheumatoid factor, multiple sites: Secondary | ICD-10-CM

## 2020-12-24 DIAGNOSIS — Z79899 Other long term (current) drug therapy: Secondary | ICD-10-CM

## 2020-12-24 DIAGNOSIS — I10 Essential (primary) hypertension: Secondary | ICD-10-CM

## 2020-12-24 DIAGNOSIS — M19042 Primary osteoarthritis, left hand: Secondary | ICD-10-CM

## 2020-12-24 DIAGNOSIS — Z8759 Personal history of other complications of pregnancy, childbirth and the puerperium: Secondary | ICD-10-CM

## 2020-12-24 DIAGNOSIS — E210 Primary hyperparathyroidism: Secondary | ICD-10-CM

## 2020-12-24 DIAGNOSIS — E063 Autoimmune thyroiditis: Secondary | ICD-10-CM

## 2020-12-24 DIAGNOSIS — Z8719 Personal history of other diseases of the digestive system: Secondary | ICD-10-CM

## 2020-12-24 DIAGNOSIS — Z87891 Personal history of nicotine dependence: Secondary | ICD-10-CM

## 2020-12-24 DIAGNOSIS — Z96611 Presence of right artificial shoulder joint: Secondary | ICD-10-CM

## 2020-12-24 DIAGNOSIS — Z8639 Personal history of other endocrine, nutritional and metabolic disease: Secondary | ICD-10-CM

## 2020-12-24 DIAGNOSIS — M19071 Primary osteoarthritis, right ankle and foot: Secondary | ICD-10-CM

## 2020-12-24 DIAGNOSIS — Z8669 Personal history of other diseases of the nervous system and sense organs: Secondary | ICD-10-CM

## 2020-12-24 DIAGNOSIS — M8589 Other specified disorders of bone density and structure, multiple sites: Secondary | ICD-10-CM

## 2020-12-24 DIAGNOSIS — F419 Anxiety disorder, unspecified: Secondary | ICD-10-CM

## 2021-01-19 ENCOUNTER — Other Ambulatory Visit: Payer: Self-pay | Admitting: Family Medicine

## 2021-02-12 ENCOUNTER — Other Ambulatory Visit: Payer: Self-pay | Admitting: Family Medicine

## 2021-02-17 ENCOUNTER — Other Ambulatory Visit: Payer: Self-pay | Admitting: Physician Assistant

## 2021-02-17 DIAGNOSIS — M0609 Rheumatoid arthritis without rheumatoid factor, multiple sites: Secondary | ICD-10-CM

## 2021-02-18 NOTE — Telephone Encounter (Signed)
Next Visit: 03/28/2021  Last Visit: 12/19/2020  Last Fill: 11/15/2020   [id number] arthritis of multiple sites with negative rheumatoid factor  Current Dose per office note 12/19/2020: Rinvoq 15 mg 1 tablet by mouth daily   Labs: 11/08/2020 CBC and CMP WNL  TB Gold: 08/31/2020 Neg    Left message to advise patient she is due to update labs.   Okay to refill Rinvoq?

## 2021-02-19 ENCOUNTER — Other Ambulatory Visit: Payer: Self-pay | Admitting: *Deleted

## 2021-02-19 ENCOUNTER — Ambulatory Visit: Payer: 59 | Admitting: Family Medicine

## 2021-02-19 DIAGNOSIS — M0609 Rheumatoid arthritis without rheumatoid factor, multiple sites: Secondary | ICD-10-CM

## 2021-02-19 DIAGNOSIS — Z79899 Other long term (current) drug therapy: Secondary | ICD-10-CM

## 2021-02-20 LAB — COMPLETE METABOLIC PANEL WITH GFR
AG Ratio: 1.7 (calc) (ref 1.0–2.5)
ALT: 22 U/L (ref 6–29)
AST: 20 U/L (ref 10–35)
Albumin: 4.3 g/dL (ref 3.6–5.1)
Alkaline phosphatase (APISO): 71 U/L (ref 37–153)
BUN: 13 mg/dL (ref 7–25)
CO2: 28 mmol/L (ref 20–32)
Calcium: 10.2 mg/dL (ref 8.6–10.4)
Chloride: 103 mmol/L (ref 98–110)
Creat: 0.6 mg/dL (ref 0.50–1.03)
Globulin: 2.6 g/dL (calc) (ref 1.9–3.7)
Glucose, Bld: 84 mg/dL (ref 65–99)
Potassium: 4.9 mmol/L (ref 3.5–5.3)
Sodium: 139 mmol/L (ref 135–146)
Total Bilirubin: 0.5 mg/dL (ref 0.2–1.2)
Total Protein: 6.9 g/dL (ref 6.1–8.1)
eGFR: 103 mL/min/{1.73_m2} (ref >=60)

## 2021-02-20 LAB — CBC WITH DIFFERENTIAL/PLATELET
Absolute Monocytes: 624 cells/uL (ref 200–950)
Basophils Absolute: 42 cells/uL (ref 0–200)
Basophils Relative: 0.7 %
Eosinophils Absolute: 78 cells/uL (ref 15–500)
Eosinophils Relative: 1.3 %
HCT: 39.1 % (ref 35.0–45.0)
Hemoglobin: 13 g/dL (ref 11.7–15.5)
Lymphs Abs: 2736 cells/uL (ref 850–3900)
MCH: 31.2 pg (ref 27.0–33.0)
MCHC: 33.2 g/dL (ref 32.0–36.0)
MCV: 93.8 fL (ref 80.0–100.0)
MPV: 11.2 fL (ref 7.5–12.5)
Monocytes Relative: 10.4 %
Neutro Abs: 2520 cells/uL (ref 1500–7800)
Neutrophils Relative %: 42 %
Platelets: 250 10*3/uL (ref 140–400)
RBC: 4.17 10*6/uL (ref 3.80–5.10)
RDW: 12.4 % (ref 11.0–15.0)
Total Lymphocyte: 45.6 %
WBC: 6 10*3/uL (ref 3.8–10.8)

## 2021-02-20 NOTE — Progress Notes (Signed)
CBC and CMP WNL

## 2021-02-23 ENCOUNTER — Other Ambulatory Visit: Payer: Self-pay | Admitting: Family Medicine

## 2021-02-23 DIAGNOSIS — F419 Anxiety disorder, unspecified: Secondary | ICD-10-CM

## 2021-02-23 DIAGNOSIS — F32A Depression, unspecified: Secondary | ICD-10-CM

## 2021-03-13 ENCOUNTER — Other Ambulatory Visit: Payer: Self-pay | Admitting: Family Medicine

## 2021-03-14 NOTE — Progress Notes (Signed)
Office Visit Note  Patient: Sandra [name]             Date of Birth: 11-24-1960           MRN: [id number]             PCP: [name], MD Referring: [name], MD Visit Date: 03/28/2021 Occupation: @GUAROCC @  Subjective:  Pain in hands and feet.   History of Present Illness: Sandra [name] is a 61 y.o. female with history of rheumatoid arthritis and osteoarthritis overlap.  She states she recently has been experiencing increased pain in her hands and her feet.  She has been taking Rinvoq and leflunomide on a regular basis which has been working well for her.  She states  her feet are on the top .  She has not seen any joint swelling.  None of the other joints are painful.  Activities of Daily Living:  Patient reports morning stiffness for 1 hour.   Patient Reports nocturnal pain.  Difficulty dressing/grooming: Denies Difficulty climbing stairs: Denies Difficulty getting out of chair: Denies Difficulty using hands for taps, buttons, cutlery, and/or writing: Reports  Review of Systems  Constitutional:  Positive for fatigue.  HENT:  Positive for mouth sores. Negative for mouth dryness and nose dryness.   Eyes:  Negative for pain, itching and dryness.  Respiratory:  Negative for shortness of breath and difficulty breathing.   Cardiovascular:  Negative for chest pain and palpitations.  Gastrointestinal:  Negative for blood in stool, constipation and diarrhea.  Endocrine: Negative for increased urination.  Genitourinary:  Negative for difficulty urinating.  Musculoskeletal:  Positive for joint pain, joint pain, joint swelling and morning stiffness. Negative for myalgias, muscle tenderness and myalgias.  Skin:  Negative for color change, rash and redness.  Allergic/Immunologic: Negative for susceptible to infections.  Neurological:  Negative for dizziness, numbness, headaches, memory loss and weakness.  Hematological:  Negative for bruising/bleeding tendency.   Psychiatric/Behavioral:  Negative for confusion.    PMFS History:  Patient Active Problem List   Diagnosis Date Noted   Back pain 12/13/2019   Rheumatoid arthritis ([place]) 12/02/2019   Anxiety and depression 08/01/2019   Arthralgia 05/04/2019   OSA (obstructive sleep apnea) 05/03/2019   Thyroiditis 12/20/2018   Muscle cramp 12/20/2018   Eustachian tube dysfunction 07/27/2017   Headache 11/25/2015   Acute bacterial sinusitis 03/26/2015   Hematuria 04/09/2014   Hyperglycemia 04/06/2014   Diarrhea 09/21/2013   Hyperparathyroidism, primary ([place]) 07/12/2013   Dyspnea 12/28/2012   Hypercalcemia 12/16/2012   H/O tobacco use, presenting hazards to health 11/29/2012   Overactive bladder 05/31/2012   Elevated LFTs 05/31/2012   Obesity (BMI 30-39.9) 05/31/2012   Allergic rhinitis 05/31/2012   Hoarseness 02/04/2012   Preventative health care 02/04/2012   SUI (stress urinary incontinence, female) 02/04/2012   Migraine    GERD (gastroesophageal reflux disease)    Rotator cuff syndrome of right shoulder 06/11/2011   Atypical chest pain 01/03/2011   Hypertension 01/03/2011   Hyperlipidemia 01/03/2011   KNEE PAIN, BILATERAL 09/28/2008   ANSERINE BURSITIS 09/28/2008   CAVUS DEFORMITY OF FOOT, ACQUIRED 09/28/2008    Past Medical History:  Diagnosis Date   Allergic rhinitis 05/31/2012   Chest pain    PT SAW CARDIOLOGIST DR. Burt Knack - STRESS ECHO DONE NEGATIVE - NO PROBLEM SINCE   Chicken pox as achild   Cough 11/29/2012   RESOLVED - IT WAS CAUSED BY LISINOPRIL   Elevated LFTs 05/31/2012   Eustachian  tube dysfunction 07/27/2017   GERD (gastroesophageal reflux disease)    H/O tobacco use, presenting hazards to health 11/29/2012   Smoked 1 1/2 ppd for 20 years quit in roughtly 2007   Headache 11/25/2015   Hematuria 04/09/2014   Hoarseness 02/04/2012   RESOLVED - THOUGHT TO HAVE BEEN ALLERY RELATED   Hyperglycemia 04/06/2014   Hyperlipidemia    Hyperparathyroidism ([place])    Hypertension     Obesity, unspecified 05/31/2012   Ocular migraine    Overactive bladder 05/31/2012   Preventative health care 02/04/2012   PTSD (post-traumatic stress disorder)    Rapid heart beat    PT FEELS PROB RELATED TO HYPER PARATHYROIDISM   Rheumatoid arthritis ([place])    SUI (stress urinary incontinence, female) 02/04/2012   Sees Dr Perlie Gold and they have discussed a bladder tack but so far she declines    Family History  Problem Relation Age of Onset   Hypertension Mother 30       alive   Renal cancer Mother        renal   Hyperlipidemia Mother    Osteoporosis Mother        humerus    Macular degeneration Mother    Tremor Mother    Parkinson's disease Mother    Hypertension Father 64       alive   Atrial fibrillation Father    Colon cancer Father        colon   Hyperlipidemia Father    Stroke Father    Alzheimer's disease Father    Hypertension Sister    Hypertension Brother 64       alive   Tremor Brother    Heart disease Paternal Aunt    Heart disease Paternal Uncle    Lung cancer Maternal Grandfather        lung/ smoker   Emphysema Maternal Grandfather    Heart disease Paternal Grandmother    Other Paternal Grandfather        black lung   Heart disease Paternal Grandfather    Asthma Son    Eczema Son    Healthy Son    Past Surgical History:  Procedure Laterality Date   BREAST EXCISIONAL BIOPSY Left    Age 32 Fibroadenoma   BREAST SURGERY     left breast excision of adenoma   btl     COLONOSCOPY  07/2020   PARATHYROIDECTOMY Right 08/12/2013   Procedure: right inferior frozen section PARATHYROIDECTOMY;  Surgeon: Earnstine Regal, MD;  Location: [facility];  Service: General;  Laterality: Right;   PELVIC LAPAROSCOPY  03/2004   TOTAL SHOULDER REPLACEMENT Right 01/2019   TUBAL LIGATION     WISDOM TOOTH EXTRACTION  30 yrs ago   Social History   Social History Narrative   Not on file   Immunization History  Administered Date(s) Administered   Hepatitis A, Adult  09/04/2016, 03/27/2017   Influenza Split 12/09/2011   Influenza,inj,Quad PF,6+ Mos 11/29/2012, 11/01/2013, 11/20/2015, 12/29/2017, 04/03/2019, 12/13/2019   PFIZER(Purple Top)SARS-COV-2 Vaccination 05/20/2019, 06/15/2019, 01/11/2020   Pneumococcal Polysaccharide-23 08/01/2019   Tdap 06/07/2015   Zoster Recombinat (Shingrix) 08/01/2019, 10/03/2020     Objective: Vital Signs: BP 136/89 (BP Location: Left Arm, Patient Position: Sitting, Cuff Size: Normal)    Pulse 77    Ht 5\' 5"  (1.651 m)    Wt 193 lb 3.2 oz (87.6 kg)    LMP 04/01/2011    BMI 32.15 kg/m    Physical Exam Vitals and nursing note reviewed.  Constitutional:      Appearance: She is well-developed.  HENT:     Head: Normocephalic and atraumatic.  Eyes:     Conjunctiva/sclera: Conjunctivae normal.  Cardiovascular:     Rate and Rhythm: Normal rate and regular rhythm.     Heart sounds: Normal heart sounds.  Pulmonary:     Effort: Pulmonary effort is normal.     Breath sounds: Normal breath sounds.  Abdominal:     General: Bowel sounds are normal.     Palpations: Abdomen is soft.  Musculoskeletal:     Cervical back: Normal range of motion.  Lymphadenopathy:     Cervical: No cervical adenopathy.  Skin:    General: Skin is warm and dry.     Capillary Refill: Capillary refill takes less than 2 seconds.  Neurological:     Mental Status: She is alert and oriented to person, place, and time.  Psychiatric:        Behavior: Behavior normal.     Musculoskeletal Exam: C-spine was in good range of motion.  Shoulder joints, elbow joints, wrist joints were in good range of motion.  She had bilateral PIP and DIP thickening with no synovitis.  Hip joints and knee joints in good range of motion.  She had bilateral dorsal spurs.  She had callus formation under her heels and under metatarsals.  No synovitis was noted.  CDAI Exam: CDAI Score: -- Patient Global: --; Provider Global: -- Swollen: --; Tender: -- Joint Exam 03/28/2021    No joint exam has been documented for this visit   There is currently no information documented on the homunculus. Go to the Rheumatology activity and complete the homunculus joint exam.  Investigation: No additional findings.  Imaging: No results found.  Recent Labs: Lab Results  Component Value Date   WBC 6.0 02/19/2021   HGB 13.0 02/19/2021   PLT 250 02/19/2021   NA 139 02/19/2021   K 4.9 02/19/2021   CL 103 02/19/2021   CO2 28 02/19/2021   GLUCOSE 84 02/19/2021   BUN 13 02/19/2021   CREATININE 0.60 02/19/2021   BILITOT 0.5 02/19/2021   ALKPHOS 88 11/01/2020   AST 20 02/19/2021   ALT 22 02/19/2021   PROT 6.9 02/19/2021   ALBUMIN 4.7 11/01/2020   CALCIUM 10.2 02/19/2021   GFRAA 117 08/31/2020   QFTBGOLDPLUS NEGATIVE 08/31/2020    Speciality Comments: Orencia stoped 10/04/20 Rinvoq started 10/11/20  Procedures:  No procedures performed Allergies: Cefdinir, Doxycycline, Codeine, Penicillins, Simvastatin, and Other   Assessment / Plan:     Visit Diagnoses: Rheumatoid arthritis of multiple sites with negative rheumatoid factor ([place]) -she had no synovitis on my examination.  She has been tolerating medications well.  Although she continues to have discomfort in her hands and feet which I believe is coming from underlying osteoarthritis.  Prescription refill for Rinvoq was given today.  Plan: Upadacitinib ER (RINVOQ) 15 MG TB24  High risk medication use - Arava 20 mg 1 tablet by mouth daily and Rinvoq 15 mg 1 tablet by mouth daily.  Rinvoq added on at the end of July 2022.  Labs obtained on February 19, 2021 CBC with differential and CMP with GFR were normal.  TB gold was negative on August 31, 2020.  Lab findings were reviewed with the patient.  She is doing much better on the combination of Rinvoq and leflunomide.  Side effects of Rinvoq including increased risk of infection, malignancy and black box MACE warning were reviewed again today.  Information regarding immunization  was placed in the AVS.  She was advised to hold Rinvoq and leflunomide in case she develops an infection.  Previous treatment: Methotrexate-joint pain, Humira-she took for 4 months inadequate response, Enbrel injection site reaction, Orencia inadequate response and injection site reaction.  Osteopenia of multiple sites - DEXA updated on 08/12/19: The BMD measured at Forearm Radius 33% is 0.751 g/cm2 with a T-score of -1.4.  Calcium rich diet was discussed.  She is on vitamin D supplement.  Status post total shoulder replacement, right-she   Primary osteoarthritis of both hands-she complains of pain and discomfort in her hands.  No synovitis was noted.  She had bilateral PIP and DIP thickening.  Joint protection was discussed.  Primary osteoarthritis of both feet-she complains of discomfort in her bilateral feet.  No synovitis was noted.  She has osteoarthritis.  Pes cavus of both feet-she has pes planus and dorsal spurs.  She also has callus formation under her MTPs and over her heels.  I will refer her to a podiatrist for evaluation.  Other medical problems are listed as follows:  Hyperparathyroidism, primary ([place])  Anxiety and depression-she lost her son in December 2022 from [place].  Hashimoto's thyroiditis  Essential hypertension  History of gastroesophageal reflux (GERD)  History of miscarriage  History of hyperlipidemia  Hx of migraines  Orders: Orders Placed This Encounter  Procedures   Ambulatory referral to Podiatry   Meds ordered this encounter  Medications   Upadacitinib ER (RINVOQ) 15 MG TB24    Sig: Take 1 tablet by mouth daily.    Dispense:  90 tablet    Refill:  0     Follow-Up Instructions: Return in about 5 months (around 08/26/2021) for Rheumatoid arthritis.   Bo Merino, MD  Note - This record has been created using Editor, commissioning.  Chart creation errors have been sought, but may not always  have been located. Such creation errors do  not reflect on  the standard of medical care.

## 2021-03-28 ENCOUNTER — Encounter: Payer: Self-pay | Admitting: Family Medicine

## 2021-03-28 ENCOUNTER — Encounter (INDEPENDENT_AMBULATORY_CARE_PROVIDER_SITE_OTHER): Payer: BLUE CROSS/BLUE SHIELD | Admitting: Family Medicine

## 2021-03-28 ENCOUNTER — Ambulatory Visit (INDEPENDENT_AMBULATORY_CARE_PROVIDER_SITE_OTHER): Payer: BLUE CROSS/BLUE SHIELD | Admitting: Family Medicine

## 2021-03-28 ENCOUNTER — Ambulatory Visit: Payer: BLUE CROSS/BLUE SHIELD | Admitting: Rheumatology

## 2021-03-28 ENCOUNTER — Other Ambulatory Visit: Payer: Self-pay

## 2021-03-28 ENCOUNTER — Encounter: Payer: Self-pay | Admitting: Rheumatology

## 2021-03-28 VITALS — BP 136/89 | HR 77 | Ht 65.0 in | Wt 193.2 lb

## 2021-03-28 VITALS — BP 116/70 | HR 84 | Temp 98.0°F | Resp 12 | Ht 65.0 in | Wt 193.0 lb

## 2021-03-28 DIAGNOSIS — F32A Depression, unspecified: Secondary | ICD-10-CM

## 2021-03-28 DIAGNOSIS — M8589 Other specified disorders of bone density and structure, multiple sites: Secondary | ICD-10-CM

## 2021-03-28 DIAGNOSIS — Z96611 Presence of right artificial shoulder joint: Secondary | ICD-10-CM

## 2021-03-28 DIAGNOSIS — M19042 Primary osteoarthritis, left hand: Secondary | ICD-10-CM

## 2021-03-28 DIAGNOSIS — I10 Essential (primary) hypertension: Secondary | ICD-10-CM | POA: Diagnosis not present

## 2021-03-28 DIAGNOSIS — E782 Mixed hyperlipidemia: Secondary | ICD-10-CM | POA: Diagnosis not present

## 2021-03-28 DIAGNOSIS — R739 Hyperglycemia, unspecified: Secondary | ICD-10-CM | POA: Diagnosis not present

## 2021-03-28 DIAGNOSIS — Z8669 Personal history of other diseases of the nervous system and sense organs: Secondary | ICD-10-CM

## 2021-03-28 DIAGNOSIS — M19071 Primary osteoarthritis, right ankle and foot: Secondary | ICD-10-CM

## 2021-03-28 DIAGNOSIS — M19072 Primary osteoarthritis, left ankle and foot: Secondary | ICD-10-CM

## 2021-03-28 DIAGNOSIS — Z8719 Personal history of other diseases of the digestive system: Secondary | ICD-10-CM

## 2021-03-28 DIAGNOSIS — Z8759 Personal history of other complications of pregnancy, childbirth and the puerperium: Secondary | ICD-10-CM

## 2021-03-28 DIAGNOSIS — M0609 Rheumatoid arthritis without rheumatoid factor, multiple sites: Secondary | ICD-10-CM

## 2021-03-28 DIAGNOSIS — M06031 Rheumatoid arthritis without rheumatoid factor, right wrist: Secondary | ICD-10-CM

## 2021-03-28 DIAGNOSIS — Q6671 Congenital pes cavus, right foot: Secondary | ICD-10-CM

## 2021-03-28 DIAGNOSIS — F419 Anxiety disorder, unspecified: Secondary | ICD-10-CM

## 2021-03-28 DIAGNOSIS — Z8639 Personal history of other endocrine, nutritional and metabolic disease: Secondary | ICD-10-CM

## 2021-03-28 DIAGNOSIS — M06032 Rheumatoid arthritis without rheumatoid factor, left wrist: Secondary | ICD-10-CM

## 2021-03-28 DIAGNOSIS — E210 Primary hyperparathyroidism: Secondary | ICD-10-CM

## 2021-03-28 DIAGNOSIS — Q6672 Congenital pes cavus, left foot: Secondary | ICD-10-CM

## 2021-03-28 DIAGNOSIS — R911 Solitary pulmonary nodule: Secondary | ICD-10-CM

## 2021-03-28 DIAGNOSIS — Z79899 Other long term (current) drug therapy: Secondary | ICD-10-CM | POA: Diagnosis not present

## 2021-03-28 DIAGNOSIS — Z1211 Encounter for screening for malignant neoplasm of colon: Secondary | ICD-10-CM

## 2021-03-28 DIAGNOSIS — E063 Autoimmune thyroiditis: Secondary | ICD-10-CM

## 2021-03-28 DIAGNOSIS — Z23 Encounter for immunization: Secondary | ICD-10-CM

## 2021-03-28 DIAGNOSIS — M19041 Primary osteoarthritis, right hand: Secondary | ICD-10-CM

## 2021-03-28 DIAGNOSIS — G43909 Migraine, unspecified, not intractable, without status migrainosus: Secondary | ICD-10-CM

## 2021-03-28 MED ORDER — RINVOQ 15 MG PO TB24: 1.0000 | ORAL_TABLET | Freq: Every day | ORAL | 0 refills | Status: DC

## 2021-03-28 NOTE — Assessment & Plan Note (Signed)
Well controlled, no changes to meds. Encouraged heart healthy diet such as the DASH diet and exercise as tolerated.  °

## 2021-03-28 NOTE — Patient Instructions (Signed)
Standing Labs We placed an order today for your standing lab work.   Please have your standing labs drawn in March and every 3 months  If possible, please have your labs drawn 2 weeks prior to your appointment so that the provider can discuss your results at your appointment.  Please note that you may see your imaging and lab results in [place] before we have reviewed them. We may be awaiting multiple results to interpret others before contacting you. Please allow our office up to 72 hours to thoroughly review all of the results before contacting the office for clarification of your results.  We have open lab daily: Monday through Thursday from 1:30-4:30 PM and Friday from 1:30-4:00 PM at the office of Dr. Bo Merino, [facility].   Please be advised, all patients with office appointments requiring lab work will take precedent over walk-in lab work.  If possible, please come for your lab work on Monday and Friday afternoons, as you may experience shorter wait times. The office is located at [address] [place], [place], [place] [address] No appointment is necessary.   Labs are drawn by [facility]. Please bring your co-pay at the time of your lab draw.  You may receive a bill from [facility] for your lab work.  Please note if you are on Hydroxychloroquine and and an order has been placed for a Hydroxychloroquine level, you will need to have it drawn 4 hours or more after your last dose.  If you wish to have your labs drawn at another location, please call the office 24 hours in advance to send orders.  If you have any questions regarding directions or hours of operation,  please call [phone].   As a reminder, please drink plenty of water prior to coming for your lab work. Thanks!   If you have signs or symptoms of an infection or start antibiotics: First, call your PCP for workup of your infection. Hold your medication through the infection, until you complete your  antibiotics, and until symptoms resolve if you take the following: Injectable medication (Actemra, Benlysta, Cimzia, Cosentyx, Enbrel, Humira, Kevzara, Orencia, Remicade, Simponi, Stelara, Taltz, Tremfya) Methotrexate Leflunomide (Arava) Mycophenolate (Cellcept) Morrie Sheldon, Olumiant, or Rinvoq  Vaccines You are taking a medication(s) that can suppress your immune system.  The following immunizations are recommended: Flu annually Covid-19  Td/Tdap (tetanus, diphtheria, pertussis) every 10 years Pneumonia (Prevnar 15 then Pneumovax 23 at least 1 year apart.  Alternatively, can take Prevnar 20 without needing additional dose) Shingrix: 2 doses from 4 weeks to 6 months apart  Please check with your PCP to make sure you are up to date.

## 2021-03-28 NOTE — Patient Instructions (Signed)
Insomnia Insomnia is a sleep disorder that makes it difficult to fall asleep or stay asleep. Insomnia can cause fatigue, low energy, difficulty concentrating, mood swings, and poor performance at work or school. There are three different ways to classify insomnia: Difficulty falling asleep. Difficulty staying asleep. Waking up too early in the morning. Any type of insomnia can be long-term (chronic) or short-term (acute). Both are common. Short-term insomnia usually lasts for three months or less. Chronic insomnia occurs at least three times a week for longer than three months. What are the causes? Insomnia may be caused by another condition, situation, or substance, such as: Anxiety. Certain medicines. Gastroesophageal reflux disease (GERD) or other gastrointestinal conditions. Asthma or other breathing conditions. Restless legs syndrome, sleep apnea, or other sleep disorders. Chronic pain. Menopause. Stroke. Abuse of alcohol, tobacco, or illegal drugs. Mental health conditions, such as depression. Caffeine. Neurological disorders, such as Alzheimer's disease. An overactive thyroid (hyperthyroidism). Sometimes, the cause of insomnia may not be known. What increases the risk? Risk factors for insomnia include: Gender. Women are affected more often than men. Age. Insomnia is more common as you get older. Stress. Lack of exercise. Irregular work schedule or working night shifts. Traveling between different time zones. Certain medical and mental health conditions. What are the signs or symptoms? If you have insomnia, the main symptom is having trouble falling asleep or having trouble staying asleep. This may lead to other symptoms, such as: Feeling fatigued or having low energy. Feeling nervous about going to sleep. Not feeling rested in the morning. Having trouble concentrating. Feeling irritable, anxious, or depressed. How is this diagnosed? This condition may be diagnosed  based on: Your symptoms and medical history. Your health care provider may ask about: Your sleep habits. Any medical conditions you have. Your mental health. A physical exam. How is this treated? Treatment for insomnia depends on the cause. Treatment may focus on treating an underlying condition that is causing insomnia. Treatment may also include: Medicines to help you sleep. Counseling or therapy. Lifestyle adjustments to help you sleep better. Follow these instructions at home: Eating and drinking  Limit or avoid alcohol, caffeinated beverages, and cigarettes, especially close to bedtime. These can disrupt your sleep. Do not eat a large meal or eat spicy foods right before bedtime. This can lead to digestive discomfort that can make it hard for you to sleep. Sleep habits  Keep a sleep diary to help you and your health care provider figure out what could be causing your insomnia. Write down: When you sleep. When you wake up during the night. How well you sleep. How rested you feel the next day. Any side effects of medicines you are taking. What you eat and drink. Make your bedroom a dark, comfortable place where it is easy to fall asleep. Put up shades or blackout curtains to block light from outside. Use a white noise machine to block noise. Keep the temperature cool. Limit screen use before bedtime. This includes: Watching TV. Using your smartphone, tablet, or computer. Stick to a routine that includes going to bed and waking up at the same times every day and night. This can help you fall asleep faster. Consider making a quiet activity, such as reading, part of your nighttime routine. Try to avoid taking naps during the day so that you sleep better at night. Get out of bed if you are still awake after 15 minutes of trying to sleep. Keep the lights down, but try reading or doing a  quiet activity. When you feel sleepy, go back to bed. General instructions Take over-the-counter  and prescription medicines only as told by your health care provider. Exercise regularly, as told by your health care provider. Avoid exercise starting several hours before bedtime. Use relaxation techniques to manage stress. Ask your health care provider to suggest some techniques that may work well for you. These may include: Breathing exercises. Routines to release muscle tension. Visualizing peaceful scenes. Make sure that you drive carefully. Avoid driving if you feel very sleepy. Keep all follow-up visits as told by your health care provider. This is important. Contact a health care provider if: You are tired throughout the day. You have trouble in your daily routine due to sleepiness. You continue to have sleep problems, or your sleep problems get worse. Get help right away if: You have serious thoughts about hurting yourself or someone else. If you ever feel like you may hurt yourself or others, or have thoughts about taking your own life, get help right away. You can go to your nearest emergency department or call: Your local emergency services (911 in the [place] A suicide crisis helpline, such as the [place] at (959)779-9266 or 988 in the [place]. This is open 24 hours a day. Summary Insomnia is a sleep disorder that makes it difficult to fall asleep or stay asleep. Insomnia can be long-term (chronic) or short-term (acute). Treatment for insomnia depends on the cause. Treatment may focus on treating an underlying condition that is causing insomnia. Keep a sleep diary to help you and your health care provider figure out what could be causing your insomnia. This information is not intended to replace advice given to you by your health care provider. Make sure you discuss any questions you have with your health care provider. Document Revised: 09/19/2020 Document Reviewed: 01/05/2020 Elsevier Patient Education  2022 [place].   Magnesium glycinate 200  to 400 mg at bedtime LTryptophan capsules Melatonin 2-10 mg   NOW Sleep+  Or [employer] Sleep support  Encouraged good sleep hygiene such as dark, quiet room. No blue/green glowing lights such as computer screens in bedroom. No alcohol or stimulants in evening. Cut down on caffeine as able. Regular exercise is helpful but not just prior to bed time.

## 2021-03-28 NOTE — Assessment & Plan Note (Signed)
Encourage heart healthy diet such as MIND or DASH diet, increase exercise, avoid trans fats, simple carbohydrates and processed foods, consider a krill or fish or flaxseed oil cap daily.  °

## 2021-03-29 DIAGNOSIS — R911 Solitary pulmonary nodule: Secondary | ICD-10-CM | POA: Insufficient documentation

## 2021-03-29 NOTE — Assessment & Plan Note (Signed)
Found incidentally summer of 2022 3 mm. Patient does have a h/o of smoking although she did quit in 2007 after smoking 1/2 ppd for 20 years. Repeat CT scan next summer.

## 2021-03-29 NOTE — Assessment & Plan Note (Signed)
Patient very sad and tearful as her son died very suddenly a month ago from a complication of his leukemia and his thrombocytopenia.  She has great family and friends support but of course is grieving.  He was active duty [employer] so she has been able to access their grief counseling and mother's group.  For now she does not want to change in medications but she will let us know if she does

## 2021-03-29 NOTE — Assessment & Plan Note (Signed)
FH of colon cancer, she follows with dr Earlean Shawl and is due for next colonoscopy referral placed

## 2021-03-29 NOTE — Assessment & Plan Note (Signed)
hgba1c acceptable, minimize simple carbs. Increase exercise as tolerated.  

## 2021-03-29 NOTE — Assessment & Plan Note (Signed)
Has an appt with her Rheumatologist today

## 2021-03-29 NOTE — Progress Notes (Signed)
Subjective:    Patient ID: Sandra [name], female    DOB: 12-Mar-1960, 61 y.o.   MRN: [id number]  Chief Complaint  Patient presents with   Follow-up    HPI Patient is in today for follow up on chronic medical concerns. No recent febrile illness or hospitalizations. Patient very sad and tearful as her son died very suddenly a month ago from a complication of his leukemia and his thrombocytopenia.  She has great family and friends support but of course is grieving.  He was active duty Nature conservation officer so she has been able to access their grief counseling and mother's group.  For now she does not want to change in medications but she will let us know if she does. Denies CP/palp/SOB/HA/congestion/fevers/GI or GU c/o. Taking meds as prescribed   Past Medical History:  Diagnosis Date   Allergic rhinitis 05/31/2012   Chest pain    PT SAW CARDIOLOGIST DR. Burt Knack - STRESS ECHO DONE NEGATIVE - NO PROBLEM SINCE   Chicken pox as achild   Cough 11/29/2012   RESOLVED - IT WAS CAUSED BY LISINOPRIL   Elevated LFTs 05/31/2012   Eustachian tube dysfunction 07/27/2017   GERD (gastroesophageal reflux disease)    H/O tobacco use, presenting hazards to health 11/29/2012   Smoked 1 1/2 ppd for 20 years quit in roughtly 2007   Headache 11/25/2015   Hematuria 04/09/2014   Hoarseness 02/04/2012   RESOLVED - THOUGHT TO HAVE BEEN ALLERY RELATED   Hyperglycemia 04/06/2014   Hyperlipidemia    Hyperparathyroidism ([place])    Hypertension    Obesity, unspecified 05/31/2012   Ocular migraine    Overactive bladder 05/31/2012   Preventative health care 02/04/2012   PTSD (post-traumatic stress disorder)    Rapid heart beat    PT FEELS PROB RELATED TO HYPER PARATHYROIDISM   Rheumatoid arthritis (HCC)    SUI (stress urinary incontinence, female) 02/04/2012   Sees Dr Perlie Gold and they have discussed a bladder tack but so far she declines    Past Surgical History:  Procedure Laterality Date   BREAST EXCISIONAL BIOPSY Left     Age 49 Fibroadenoma   BREAST SURGERY     left breast excision of adenoma   btl     COLONOSCOPY  07/2020   PARATHYROIDECTOMY Right 08/12/2013   Procedure: right inferior frozen section PARATHYROIDECTOMY;  Surgeon: Earnstine Regal, MD;  Location: [facility];  Service: General;  Laterality: Right;   PELVIC LAPAROSCOPY  03/2004   TOTAL SHOULDER REPLACEMENT Right 01/2019   TUBAL LIGATION     WISDOM TOOTH EXTRACTION  16 yrs ago    Family History  Problem Relation Age of Onset   Hypertension Mother 84       alive   Renal cancer Mother        renal   Hyperlipidemia Mother    Osteoporosis Mother        humerus    Macular degeneration Mother    Tremor Mother    Parkinson's disease Mother    Hypertension Father 23       alive   Atrial fibrillation Father    Colon cancer Father        colon   Hyperlipidemia Father    Stroke Father    Alzheimer's disease Father    Hypertension Sister    Hypertension Brother 75       alive   Tremor Brother    Asthma Son    Eczema Son  Healthy Son    Heart disease Paternal Aunt    Heart disease Paternal Uncle    Lung cancer Maternal Grandfather        lung/ smoker   Emphysema Maternal Grandfather    Heart disease Paternal Grandmother    Other Paternal Grandfather        black lung   Heart disease Paternal Grandfather     Social History   Socioeconomic History   Marital status: Married    Spouse name: Not on file   Number of children: 2   Years of education: Not on file   Highest education level: Not on file  Occupational History   Occupation: Programmer, multimedia: [employer]  Tobacco Use   Smoking status: Former    Packs/day: 1.00    Years: 20.00    Pack years: 20.00    Types: Cigarettes    Quit date: 03/10/2005    Years since quitting: 16.0   Smokeless tobacco: Never  Vaping Use   Vaping Use: Never used  Substance and Sexual Activity   Alcohol use: Not Currently   Drug use: No   Sexual activity: Yes    Partners: Male   Other Topics Concern   Not on file  Social History Narrative   Not on file   Social Determinants of Health   Financial Resource Strain: Not on file  Food Insecurity: Not on file  Transportation Needs: Not on file  Physical Activity: Not on file  Stress: Not on file  Social Connections: Not on file  Intimate Partner Violence: Not on file    Outpatient Medications Prior to Visit  Medication Sig Dispense Refill   ALPRAZolam (XANAX) 0.25 MG tablet as needed.     aspirin 325 MG tablet Take 325 mg by mouth daily.     atorvastatin (LIPITOR) 40 MG tablet TAKE 1.5 TABLETS BY MOUTH DAILY. 135 tablet 1   Cholecalciferol 100 MCG (4000 UT) CAPS Vitamin D3 100 mcg (4,000 unit) capsule  Take by oral route.     Co-Enzyme Q-10 30 MG CAPS Take 100 mg by mouth at bedtime.     DOCOSAHEXAENOIC ACID PO Take 1 tablet by mouth daily.     escitalopram (LEXAPRO) 10 MG tablet TAKE 1 TABLET BY MOUTH EVERY DAY 90 tablet 1   furosemide (LASIX) 20 MG tablet TAKE 1 TABLET BY MOUTH EVERY DAY 90 tablet 1   Ginger, Zingiber officinalis, (GINGER PO) Take by mouth.     irbesartan (AVAPRO) 150 MG tablet TAKE 1 TABLET BY MOUTH EVERY DAY 90 tablet 1   KRILL OIL PO krill oil     lactobacillus acidophilus (BACID) TABS tablet Take 2 tablets by mouth 3 (three) times daily.     leflunomide (ARAVA) 20 MG tablet TAKE 1 TABLET BY MOUTH EVERY DAY 90 tablet 0   levothyroxine (SYNTHROID) 75 MCG tablet Take 1 tablet (75 mcg total) by mouth daily before breakfast. 30 tablet 0   metoprolol succinate (TOPROL-XL) 25 MG 24 hr tablet TAKE 1 TABLET (25 MG TOTAL) BY MOUTH DAILY. 90 tablet 1   Omega-3 Fatty Acids (OMEGA 3 PO) Take by mouth.     omeprazole (PRILOSEC) 40 MG capsule TAKE 1 CAPSULE BY MOUTH EVERY DAY 90 capsule 3   OVER THE COUNTER MEDICATION Take 1 capsule by mouth daily. Mega red- 1 capsule daily     TART CHERRY PO Take by mouth.     Trospium Chloride 60 MG CP24 trospium ER 60 mg capsule,extended  release 24 hr  TAKE 1  CAPSULE BY MOUTH EVERY DAY     TURMERIC PO Take 1 tablet by mouth 2 (two) times daily.     Upadacitinib ER (RINVOQ) 15 MG TB24 Take 1 tablet by mouth daily. 90 tablet 0   verapamil (CALAN-SR) 120 MG CR tablet TAKE 1 TABLET BY MOUTH EVERYDAY AT BEDTIME 90 tablet 2   No facility-administered medications prior to visit.    Allergies  Allergen Reactions   Cefdinir Other (See Comments) and Nausea And Vomiting    Severe Stomach cramps and diarrhea for several days.   Doxycycline Diarrhea   Codeine Nausea And Vomiting   Penicillins     RASH, ITCHING   Simvastatin     myalgias   Other Rash    Review of Systems  Constitutional:  Positive for malaise/fatigue. Negative for chills and fever.  HENT:  Negative for congestion and hearing loss.   Eyes:  Negative for blurred vision and discharge.  Respiratory:  Negative for cough, sputum production and shortness of breath.   Cardiovascular:  Negative for chest pain, palpitations and leg swelling.  Gastrointestinal:  Negative for abdominal pain, blood in stool, constipation, diarrhea, heartburn, nausea and vomiting.  Genitourinary:  Negative for dysuria, frequency, hematuria and urgency.  Musculoskeletal:  Positive for joint pain. Negative for back pain, falls and myalgias.  Skin:  Negative for rash.  Neurological:  Negative for dizziness, sensory change, loss of consciousness, weakness and headaches.  Endo/Heme/Allergies:  Negative for environmental allergies. Does not bruise/bleed easily.  Psychiatric/Behavioral:  Positive for depression. Negative for suicidal ideas. The patient is nervous/anxious. The patient does not have insomnia.       Objective:    Physical Exam Constitutional:      General: She is not in acute distress.    Appearance: She is well-developed.  HENT:     Head: Normocephalic and atraumatic.  Eyes:     Conjunctiva/sclera: Conjunctivae normal.  Neck:     Thyroid: No thyromegaly.  Cardiovascular:     Rate and Rhythm:  Normal rate and regular rhythm.     Heart sounds: Normal heart sounds. No murmur heard. Pulmonary:     Effort: Pulmonary effort is normal. No respiratory distress.     Breath sounds: Normal breath sounds.  Abdominal:     General: Bowel sounds are normal. There is no distension.     Palpations: Abdomen is soft. There is no mass.     Tenderness: There is no abdominal tenderness.  Musculoskeletal:     Cervical back: Neck supple.  Lymphadenopathy:     Cervical: No cervical adenopathy.  Skin:    General: Skin is warm and dry.  Neurological:     Mental Status: She is alert and oriented to person, place, and time.  Psychiatric:        Behavior: Behavior normal.    BP 116/70 (BP Location: Right Arm, Cuff Size: Large)    Pulse 84    Temp 98 F (36.7 C) (Oral)    Resp 12    Ht 5' 5" (1.651 m)    Wt 193 lb (87.5 kg)    LMP 04/01/2011    SpO2 93%    BMI 32.12 kg/m  Wt Readings from Last 3 Encounters:  03/28/21 193 lb (87.5 kg)  03/28/21 193 lb 3.2 oz (87.6 kg)  12/19/20 185 lb (83.9 kg)    Diabetic Foot Exam - Simple   No data filed    Lab Results  Component  Value Date   WBC 6.0 02/19/2021   HGB 13.0 02/19/2021   HCT 39.1 02/19/2021   PLT 250 02/19/2021   GLUCOSE 84 02/19/2021   CHOL 258 (H) 11/08/2020   TRIG 118 11/08/2020   HDL 71 11/08/2020   LDLDIRECT 112.0 05/29/2020   LDLCALC 163 (H) 11/08/2020   ALT 22 02/19/2021   AST 20 02/19/2021   NA 139 02/19/2021   K 4.9 02/19/2021   CL 103 02/19/2021   CREATININE 0.60 02/19/2021   BUN 13 02/19/2021   CO2 28 02/19/2021   TSH 1.39 05/29/2020   HGBA1C 5.7 05/29/2020    Lab Results  Component Value Date   TSH 1.39 05/29/2020   Lab Results  Component Value Date   WBC 6.0 02/19/2021   HGB 13.0 02/19/2021   HCT 39.1 02/19/2021   MCV 93.8 02/19/2021   PLT 250 02/19/2021   Lab Results  Component Value Date   NA 139 02/19/2021   K 4.9 02/19/2021   CO2 28 02/19/2021   GLUCOSE 84 02/19/2021   BUN 13 02/19/2021    CREATININE 0.60 02/19/2021   BILITOT 0.5 02/19/2021   ALKPHOS 88 11/01/2020   AST 20 02/19/2021   ALT 22 02/19/2021   PROT 6.9 02/19/2021   ALBUMIN 4.7 11/01/2020   CALCIUM 10.2 02/19/2021   ANIONGAP 10 11/01/2020   EGFR 103 02/19/2021   GFR 97.83 05/29/2020   Lab Results  Component Value Date   CHOL 258 (H) 11/08/2020   Lab Results  Component Value Date   HDL 71 11/08/2020   Lab Results  Component Value Date   LDLCALC 163 (H) 11/08/2020   Lab Results  Component Value Date   TRIG 118 11/08/2020   Lab Results  Component Value Date   CHOLHDL 3.6 11/08/2020   Lab Results  Component Value Date   HGBA1C 5.7 05/29/2020       Assessment & Plan:   Problem List Items Addressed This Visit     Hypertension    Well controlled, no changes to meds. Encouraged heart healthy diet such as the DASH diet and exercise as tolerated.       Hyperlipidemia    Encourage heart healthy diet such as MIND or DASH diet, increase exercise, avoid trans fats, simple carbohydrates and processed foods, consider a krill or fish or flaxseed oil cap daily.       Migraine    Encouraged increased hydration, 64 ounces of clear fluids daily. Minimize alcohol and caffeine. Eat small frequent meals with lean proteins and complex carbs. Avoid high and low blood sugars. Get adequate sleep, 7-8 hours a night. Needs exercise daily preferably in the morning.      Colon cancer screening    FH of colon cancer, she follows with dr Earlean Shawl and is due for next colonoscopy referral placed      Relevant Orders   Ambulatory referral to Gastroenterology   Hyperglycemia    hgba1c acceptable, minimize simple carbs. Increase exercise as tolerated.       Anxiety and depression    Patient very sad and tearful as her son died very suddenly a month ago from a complication of his leukemia and his thrombocytopenia.  She has great family and friends support but of course is grieving.  He was active duty [employer] conservation officer so she  has been able to access their grief counseling and mother's group.  For now she does not want to change in medications but she will let us know if she  does      Rheumatoid arthritis ([place])    Has an appt with her Rheumatologist today      Pulmonary nodule    Found incidentally summer of 2022 3 mm. Patient does have a h/o of smoking although she did quit in 2007 after smoking 1/2 ppd for 20 years. Repeat CT scan next summer.      Other Visit Diagnoses     Need for pneumococcal vaccination    -  Primary   Relevant Orders   Pneumococcal conjugate vaccine 13-valent (Completed)   Need for influenza vaccination       Relevant Orders   Flu Vaccine QUAD 36+ mos IM (Fluarix, Fluzone & Afluria Quad PF (Completed)       I am having Sandra [name] maintain her OVER THE COUNTER MEDICATION, lactobacillus acidophilus, Co-Enzyme Q-10, DOCOSAHEXAENOIC ACID PO, aspirin, levothyroxine, TURMERIC PO, Trospium Chloride, Omega-3 Fatty Acids (OMEGA 3 PO), (Ginger, Zingiber officinalis, (GINGER PO)), TART CHERRY PO, omeprazole, KRILL OIL PO, ALPRAZolam, Cholecalciferol, verapamil, leflunomide, metoprolol succinate, atorvastatin, escitalopram, irbesartan, furosemide, and Rinvoq.  No orders of the defined types were placed in this encounter.    Penni Homans, MD

## 2021-03-29 NOTE — Assessment & Plan Note (Signed)
Encouraged increased hydration, 64 ounces of clear fluids daily. Minimize alcohol and caffeine. Eat small frequent meals with lean proteins and complex carbs. Avoid high and low blood sugars. Get adequate sleep, 7-8 hours a night. Needs exercise daily preferably in the morning.  

## 2021-04-03 ENCOUNTER — Other Ambulatory Visit: Payer: Self-pay

## 2021-04-03 ENCOUNTER — Ambulatory Visit: Payer: BLUE CROSS/BLUE SHIELD | Admitting: Podiatry

## 2021-04-03 ENCOUNTER — Ambulatory Visit: Payer: BLUE CROSS/BLUE SHIELD

## 2021-04-03 ENCOUNTER — Ambulatory Visit (INDEPENDENT_AMBULATORY_CARE_PROVIDER_SITE_OTHER): Payer: BLUE CROSS/BLUE SHIELD

## 2021-04-03 DIAGNOSIS — M069 Rheumatoid arthritis, unspecified: Secondary | ICD-10-CM

## 2021-04-03 DIAGNOSIS — M06072 Rheumatoid arthritis without rheumatoid factor, left ankle and foot: Secondary | ICD-10-CM

## 2021-04-03 DIAGNOSIS — M06071 Rheumatoid arthritis without rheumatoid factor, right ankle and foot: Secondary | ICD-10-CM | POA: Diagnosis not present

## 2021-04-03 DIAGNOSIS — M778 Other enthesopathies, not elsewhere classified: Secondary | ICD-10-CM

## 2021-04-03 DIAGNOSIS — D2372 Other benign neoplasm of skin of left lower limb, including hip: Secondary | ICD-10-CM

## 2021-04-06 NOTE — Progress Notes (Signed)
HPI: 61 y.o. female presenting today as a new patient referral from her rheumatologist for evaluation of chronic bilateral foot pain.  Patient states that she is seen and treated by rheumatology.  They recommended that she come here for evaluation.  She says that she has had foot pain consistently for several months.  She denies a history of injury.  She has not done anything other than oral medication prescribed by her rheumatologist  Patient also states that she has a skin tag/piece of skin to the plantar aspect of the left foot that has been present for several years.  She has had it evaluated in the past by other physicians who biopsied the lesion and it was benign.  She says it is very aggravating because it is always catching on her socks and she would like to discuss the possibility of having it removed.  Patient presents today for further treatment and evaluation  Past Medical History:  Diagnosis Date   Allergic rhinitis 05/31/2012   Chest pain    PT SAW CARDIOLOGIST DR. Burt Knack - STRESS ECHO DONE NEGATIVE - NO PROBLEM SINCE   Chicken pox as achild   Cough 11/29/2012   RESOLVED - IT WAS CAUSED BY LISINOPRIL   Elevated LFTs 05/31/2012   Eustachian tube dysfunction 07/27/2017   GERD (gastroesophageal reflux disease)    H/O tobacco use, presenting hazards to health 11/29/2012   Smoked 1 1/2 ppd for 20 years quit in roughtly 2007   Headache 11/25/2015   Hematuria 04/09/2014   Hoarseness 02/04/2012   RESOLVED - THOUGHT TO HAVE BEEN ALLERY RELATED   Hyperglycemia 04/06/2014   Hyperlipidemia    Hyperparathyroidism ([place])    Hypertension    Obesity, unspecified 05/31/2012   Ocular migraine    Overactive bladder 05/31/2012   Preventative health care 02/04/2012   PTSD (post-traumatic stress disorder)    Rapid heart beat    PT FEELS PROB RELATED TO HYPER PARATHYROIDISM   Rheumatoid arthritis (HCC)    SUI (stress urinary incontinence, female) 02/04/2012   Sees Dr Perlie Gold and they have  discussed a bladder tack but so far she declines   Past Surgical History:  Procedure Laterality Date   BREAST EXCISIONAL BIOPSY Left    Age 24 Fibroadenoma   BREAST SURGERY     left breast excision of adenoma   btl     COLONOSCOPY  07/2020   PARATHYROIDECTOMY Right 08/12/2013   Procedure: right inferior frozen section PARATHYROIDECTOMY;  Surgeon: Earnstine Regal, MD;  Location: [facility];  Service: General;  Laterality: Right;   PELVIC LAPAROSCOPY  03/2004   TOTAL SHOULDER REPLACEMENT Right 01/2019   TUBAL LIGATION     WISDOM TOOTH EXTRACTION  30 yrs ago   Allergies  Allergen Reactions   Cefdinir Other (See Comments) and Nausea And Vomiting    Severe Stomach cramps and diarrhea for several days.   Doxycycline Diarrhea   Codeine Nausea And Vomiting   Penicillins     RASH, ITCHING   Simvastatin     myalgias   Other Rash      Physical Exam: General: The patient is alert and oriented x3 in no acute distress.  Dermatology: Skin is warm, dry and supple bilateral lower extremities. Negative for open lesions or macerations. There is a plantar skin tag noted to the plantar aspect of left foot. Please see picture above.   Vascular: Palpable pedal pulses bilaterally. Capillary refill within normal limits.  Negative for any significant edema or erythema  Neurological: Light touch and protective threshold grossly intact  Musculoskeletal Exam: No pedal deformities noted.  Radiographic Exam:  Normal osseous mineralization.  There is some degenerative changes noted throughout the lesser TMT joints consistent with the patient's given history of rheumatoid and osteoarthritis.  No acute fractures identified.  Assessment: 1. Capsulitis B/L midfoot 2. PMHx Rheumatoid Arthritis and Osteoarthritis 3. Plantar skin tag/benign skin lesion left plantar foot  Plan of Care:  1. Patient evaluated. X-Rays reviewed.  2.  Injection of 0.5 cc Celestone Soluspan injected into the bilateral  midfoot/TMT 3.  Appointment with Pedorthist for custom molded orthotics to support the arch and midfoot and potentially alleviate pain and pressure 4.  The patient would like to have the piece of soft tissue to the plantar foot excised completely.  This does appear to be a benign soft tissue skin tag.  The patient states that she has had it there for several years and she has had it evaluated previously by other physicians.  Patient states that is constantly catching on her socks and shoes and she can feel it when she walks and it is very aggravating despite different modifications to help alleviate the irritations caused by the skin tag.  Today I discussed the excisional biopsy procedure which would include local anesthesia offered either at the surgery center with IV sedation or here in the office without sedation.  The patient would like to have it performed at the surgery center with sedation.  All possible complications and details of the procedure were explained.  No guarantees were expressed or implied.  All patient questions answered. 5.  Authorization for surgery was initiated today.  Surgery will consist of excisional biopsy soft tissue mass left plantar foot 6.  Return to clinic 1 week postop  *Going to [place] February 2023 for a change of scenery and get away.  Lost her son December 2022 to leukemia      Edrick Kins, DPM [facility]  Dr. Edrick Kins, DPM    [address], [place] [address]                Office [phone]  Fax [phone]

## 2021-04-08 ENCOUNTER — Telehealth: Payer: Self-pay

## 2021-04-08 NOTE — Telephone Encounter (Signed)
Submitted a Prior Authorization request to [facility] for RINVOQ via CoverMyMeds. Will update once we receive a response.   Key: [id number] - Rx #: [id number]

## 2021-04-08 NOTE — Telephone Encounter (Signed)
Patient called requesting prescription refill of Rinvoq.  Patient states she has new insurance which requires her prescription to be filled through [employer].  Patient states she is out of medication and requested to stop by the office to pick up a sample.  Phone [phone]

## 2021-04-09 ENCOUNTER — Telehealth: Payer: Self-pay | Admitting: Rheumatology

## 2021-04-09 ENCOUNTER — Other Ambulatory Visit (HOSPITAL_COMMUNITY): Payer: Self-pay

## 2021-04-09 NOTE — Telephone Encounter (Signed)
Sent patient Mychart message to advise of approval

## 2021-04-09 NOTE — Telephone Encounter (Signed)
Received notification from [facility] regarding a prior authorization for [facility]. Authorization has been APPROVED from 04/08/21 to 04/07/22.   Authorization # Key: [id number] - Rx #: [id number]

## 2021-04-09 NOTE — Telephone Encounter (Signed)
Raquel Sarna from [facility] left a voicemail regarding the patient. She stated that they received a prescription for Rinvoq but it requires a PA. The patient believes one should have already have been submitted for this medication. The pharmacy states they do not have one. Pharmacy phone [phone]

## 2021-04-09 NOTE — Telephone Encounter (Signed)
Returned call and advised pharmacy again. Rep Helene Kelp processed claim and I provided patient's copay card. They will get patient's shipment scheduled.

## 2021-04-11 ENCOUNTER — Other Ambulatory Visit (HOSPITAL_COMMUNITY): Payer: Self-pay

## 2021-04-11 NOTE — Telephone Encounter (Signed)
Patient called stating that Rinvoq is still not being processed at [facility]. She has ATC them 4-5 times without success. She has been without Rinvoq for one week now and expresses frustration with having it filled.  Called [facility] - per rep, they did not have copay card information on file. Rep states they have VISA and Mastercard on file and both end in same number. Provided again even though Rachael had provided copay card information two days ago. Rep states they will re-process and reach out to patient once resolved. Called patient back and reviewed above  Patient states she will stop by tomorrow for Rinvoq sample.  Knox Saliva, PharmD, MPH, BCPS Clinical Pharmacist (Rheumatology and Pulmonology)

## 2021-04-12 NOTE — Telephone Encounter (Signed)
Medication Samples have been provided to the patient.  Drug name: Rinvoq       Strength: 15 mg        Qty: 1  LOT: [id number]  Exp.Date: 12/21/2022  Dosing instructions: Take 1 tablet po daily.

## 2021-04-22 ENCOUNTER — Encounter: Payer: Self-pay | Admitting: Family Medicine

## 2021-05-10 ENCOUNTER — Telehealth: Payer: Self-pay

## 2021-05-10 NOTE — Telephone Encounter (Signed)
DOS 06/13/2021 ? ?EXC BENIGN LESION LT - 25834 ? ?BCBS EFFECTIVE DATE - 03/10/2021 ? ?PLAN DEDUCTIBLE - $5500.00 W/ $5500.00 REMAINING ?OUT OF POCKET - $9100.00 WITH $2046.80 REMAINING ?COPAY $0.00 ?COINSURANCE - 50% PER SERVICE YEAR ?  ?

## 2021-05-13 ENCOUNTER — Other Ambulatory Visit: Payer: BLUE CROSS/BLUE SHIELD

## 2021-05-13 DIAGNOSIS — F329 Major depressive disorder, single episode, unspecified: Secondary | ICD-10-CM | POA: Diagnosis not present

## 2021-05-14 IMAGING — MG DIGITAL DIAGNOSTIC BILAT W/ TOMO W/ CAD
6 of 10 series · 6 of 30 positions shown · non-contrast
Comparison: Previous exam(s).

CLINICAL DATA: Patient complains of a palpable abnormality in the
left breast.

EXAM:
DIGITAL DIAGNOSTIC BILATERAL MAMMOGRAM WITH CAD AND TOMO
ULTRASOUND LEFT BREAST

[L MLO synth-2D]
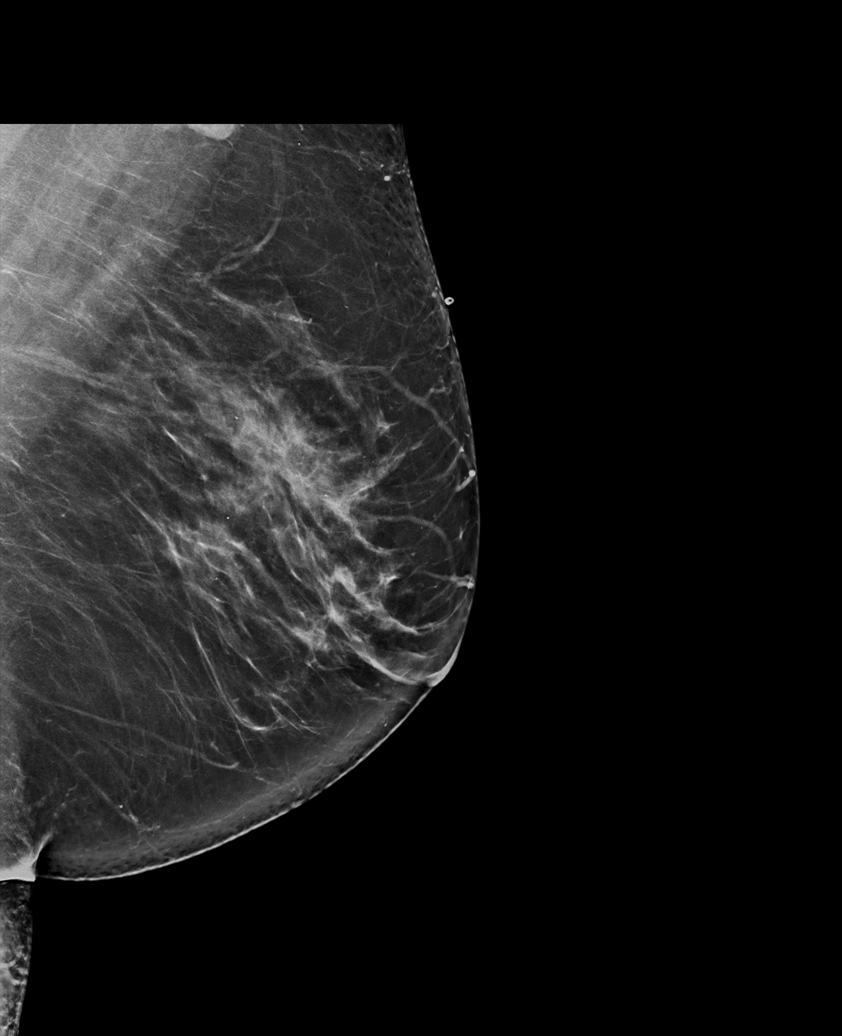

[R CC synth-2D]
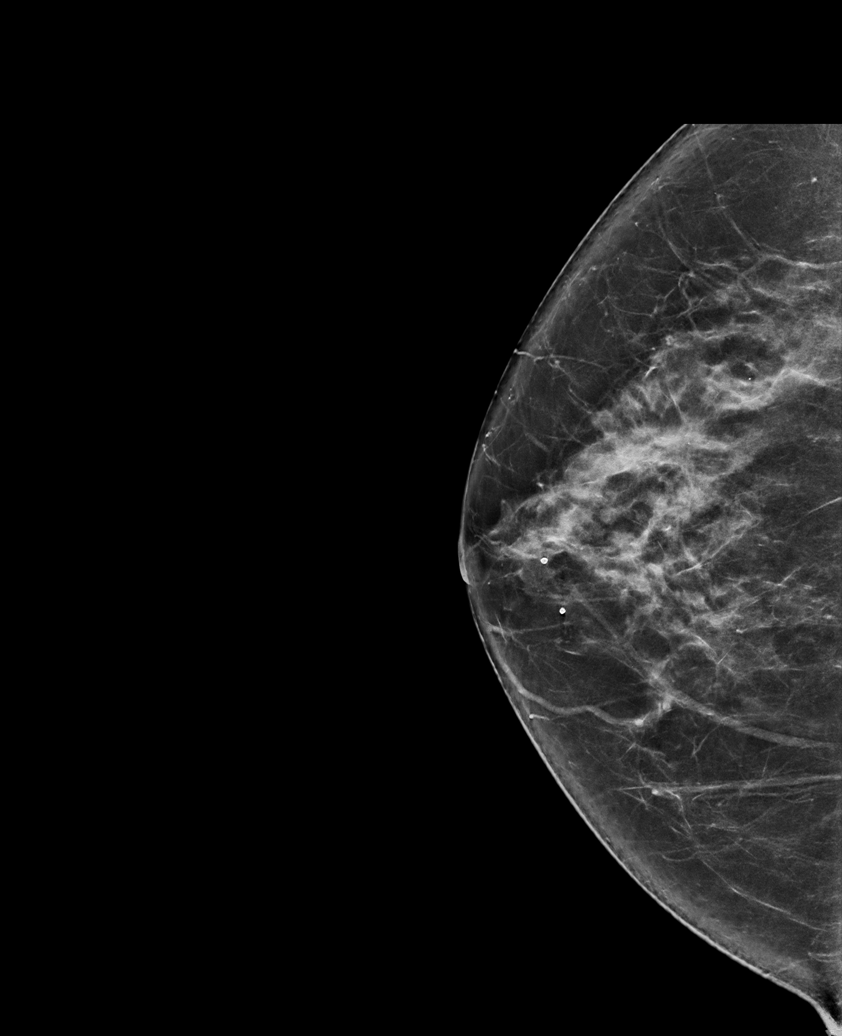

[L CC synth-2D (1 of 2)]
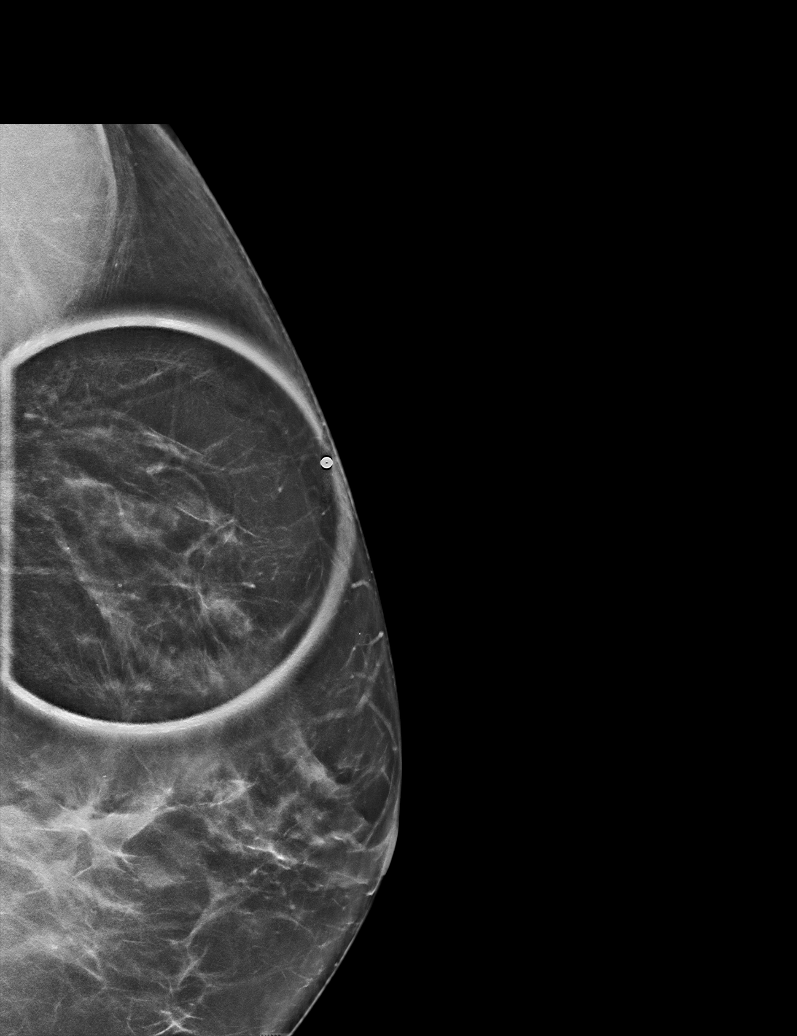

[L CC synth-2D (2 of 2)]
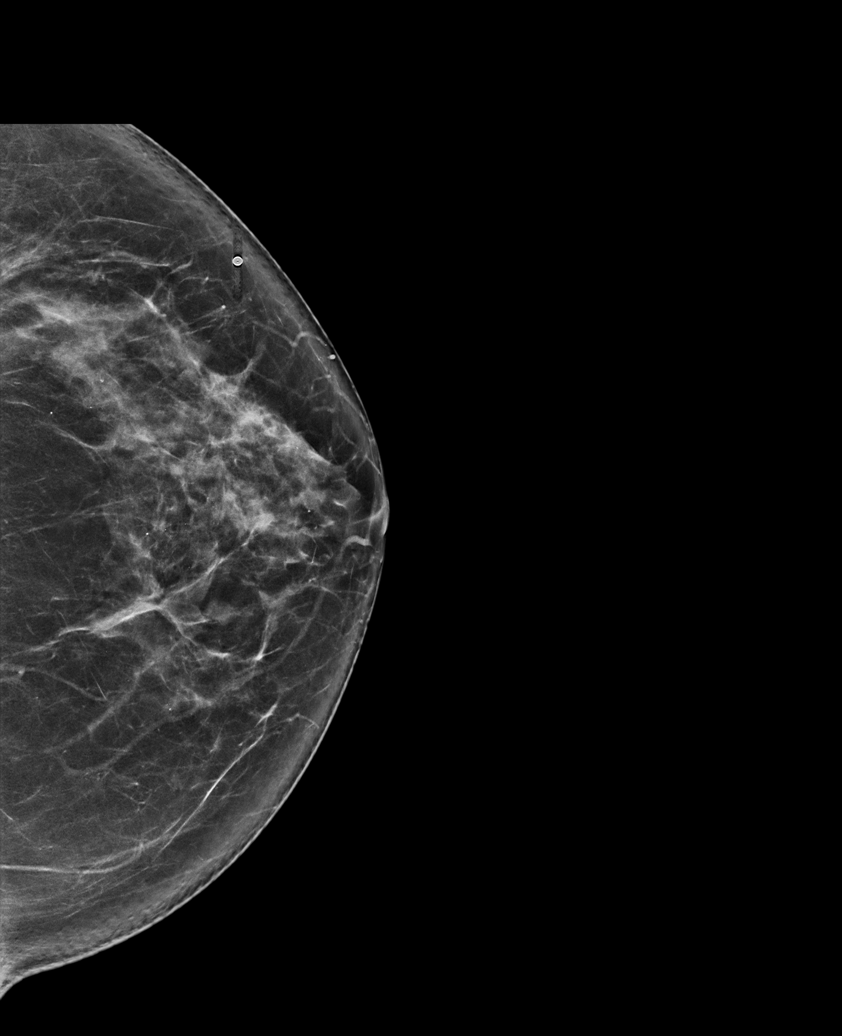

[R MLO synth-2D]
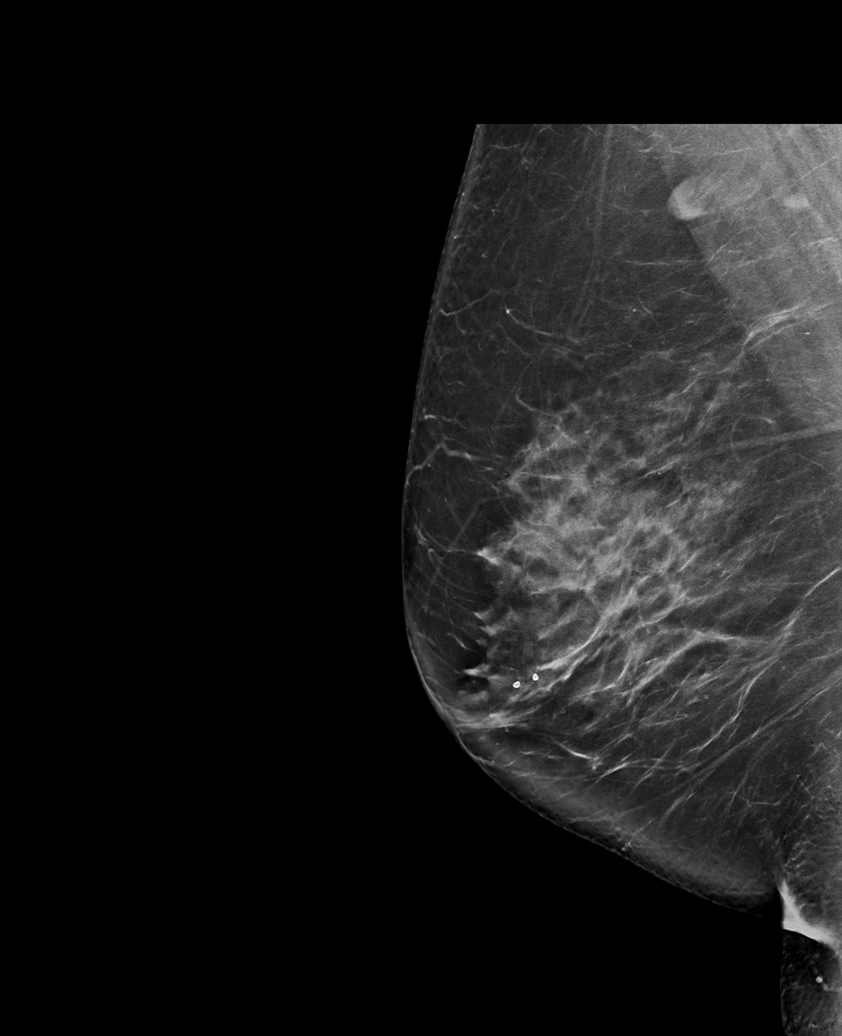

[L MLO tomo · tomo slice 47/94.0]
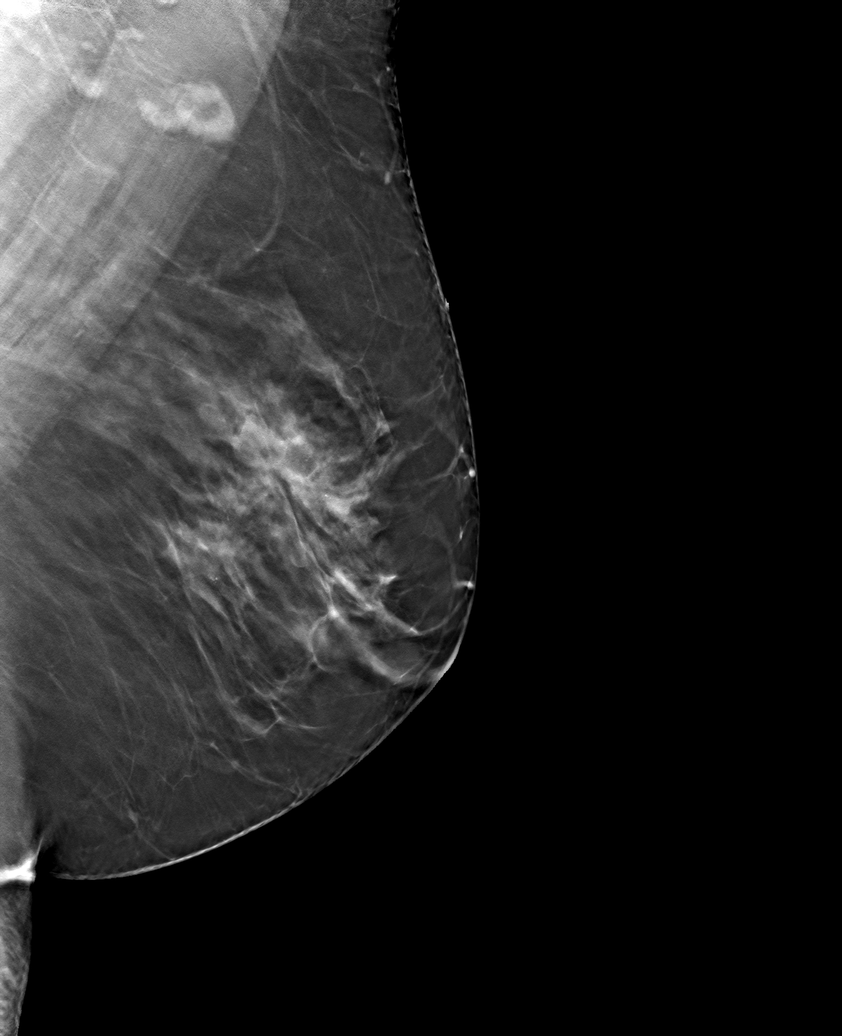

[6 of 30 positions shown; findings below may reference images not displayed]

ACR Breast Density Category b: There are scattered areas of
fibroglandular density.
FINDINGS: No suspicious mass, malignant type microcalcifications or distortion
detected in either breast. Spot tangential view of the area of
clinical concern in the left breast shows normal fibroglandular
tissue.

Mammographic images were processed with CAD.

On physical exam, I do not palpate a discrete mass in the patient's
area of clinical concern in the 2 o'clock region of the left breast.

Targeted ultrasound is performed, showing an anechoic cyst in left
breast at 2 o'clock 5 cm from the nipple measuring 8 x 4 x 8 mm. No
solid mass or abnormal shadowing detected.
IMPRESSION: No evidence of malignancy in either breast.  Left breast cyst.

RECOMMENDATION:
Bilateral screening mammogram in 1 year is recommended.

I have discussed the findings and recommendations with the patient.
If applicable, a reminder letter will be sent to the patient
regarding the next appointment.

BI-RADS CATEGORY  2: Benign.

## 2021-05-14 IMAGING — US US BREAST*L* LIMITED INC AXILLA
1 series · 7 of 7 positions shown · non-contrast
Comparison: Previous exam(s).

CLINICAL DATA: Patient complains of a palpable abnormality in the
left breast.

EXAM:
DIGITAL DIAGNOSTIC BILATERAL MAMMOGRAM WITH CAD AND TOMO
ULTRASOUND LEFT BREAST

[Series 1: us breast*left* limited inc axilla · 0.07mm/px · 7 of 7 slices shown]
[im 1/7]
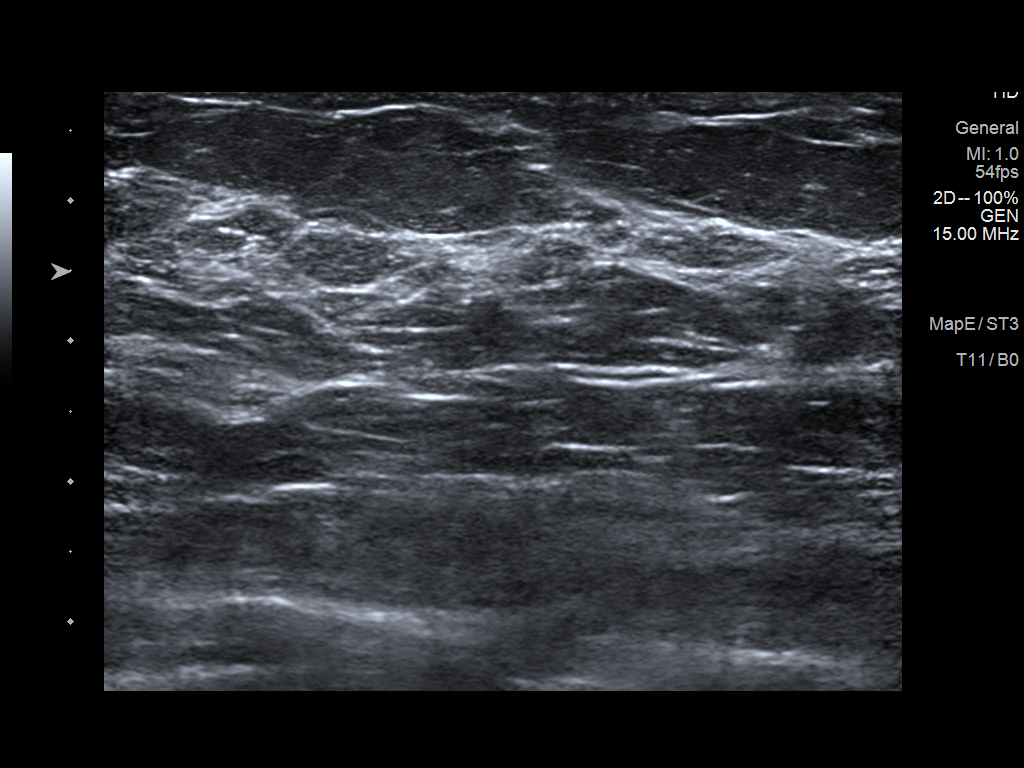
[im 2/7]
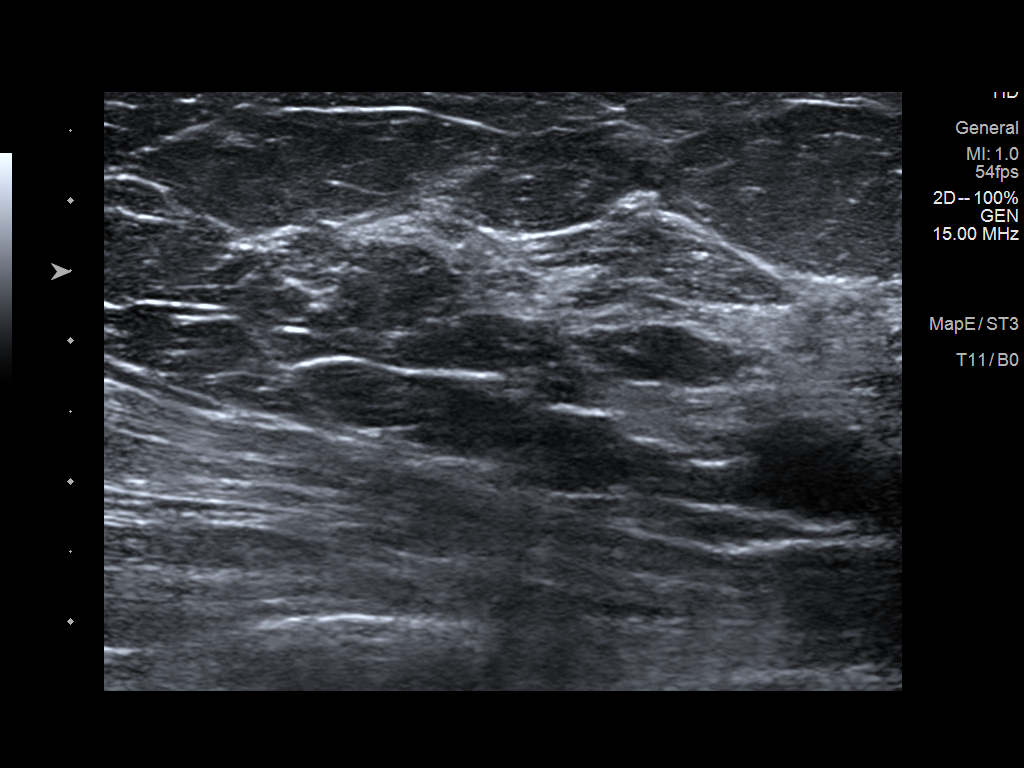
[im 3/7]
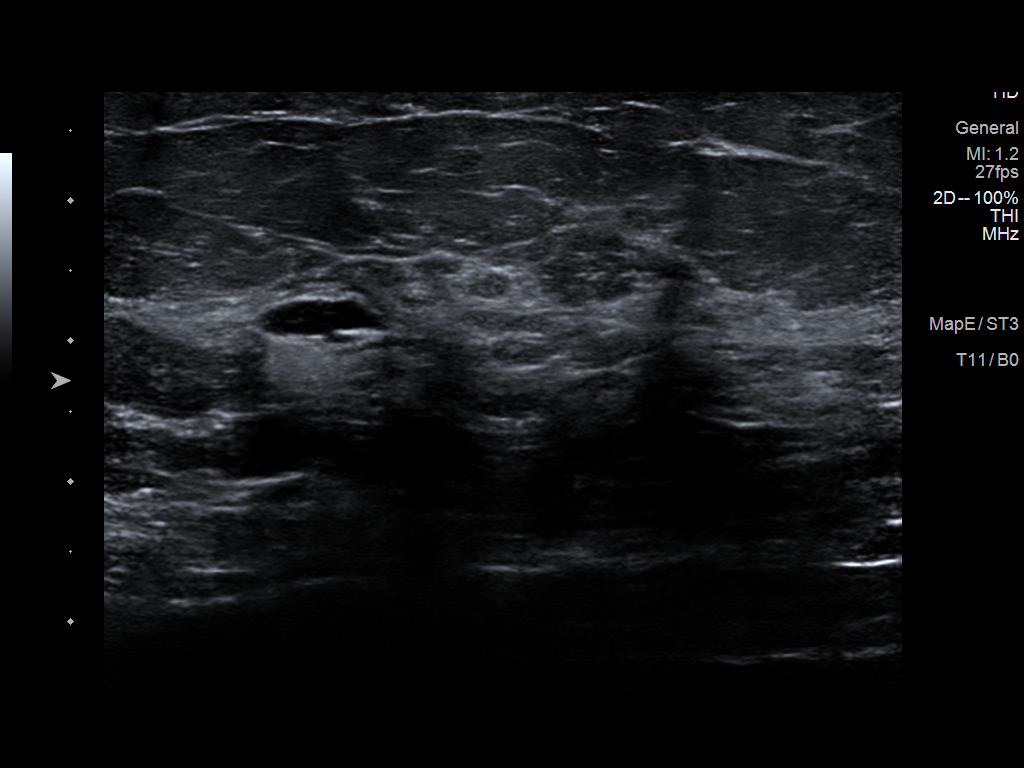
[im 4/7]
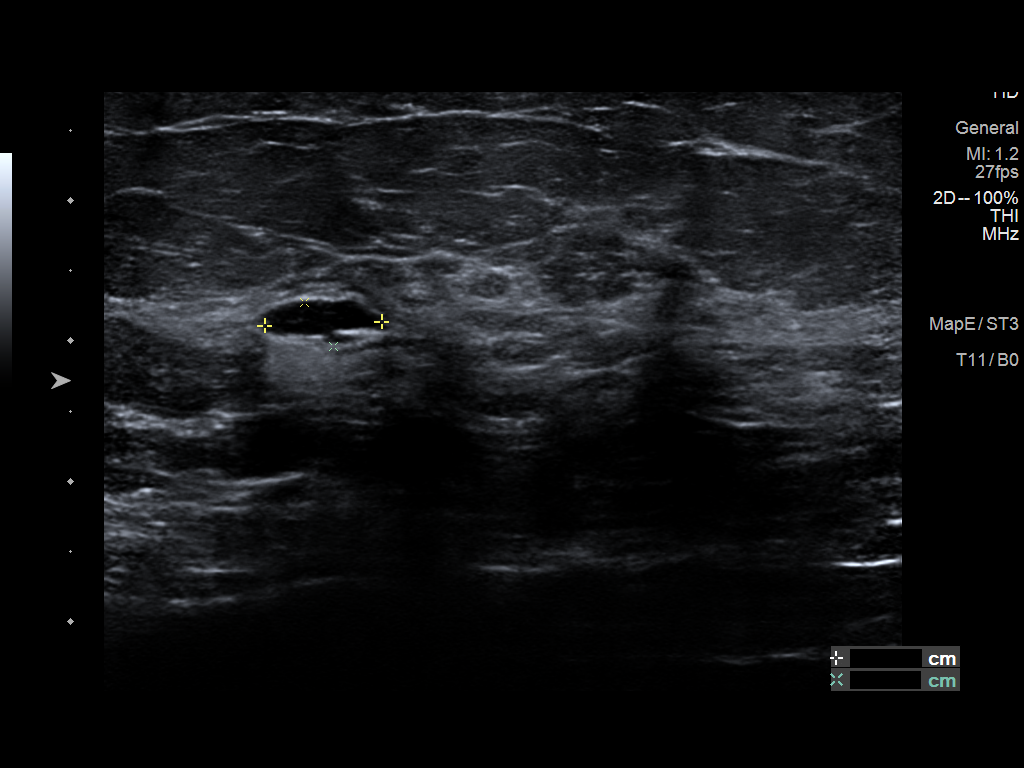
[im 5/7]
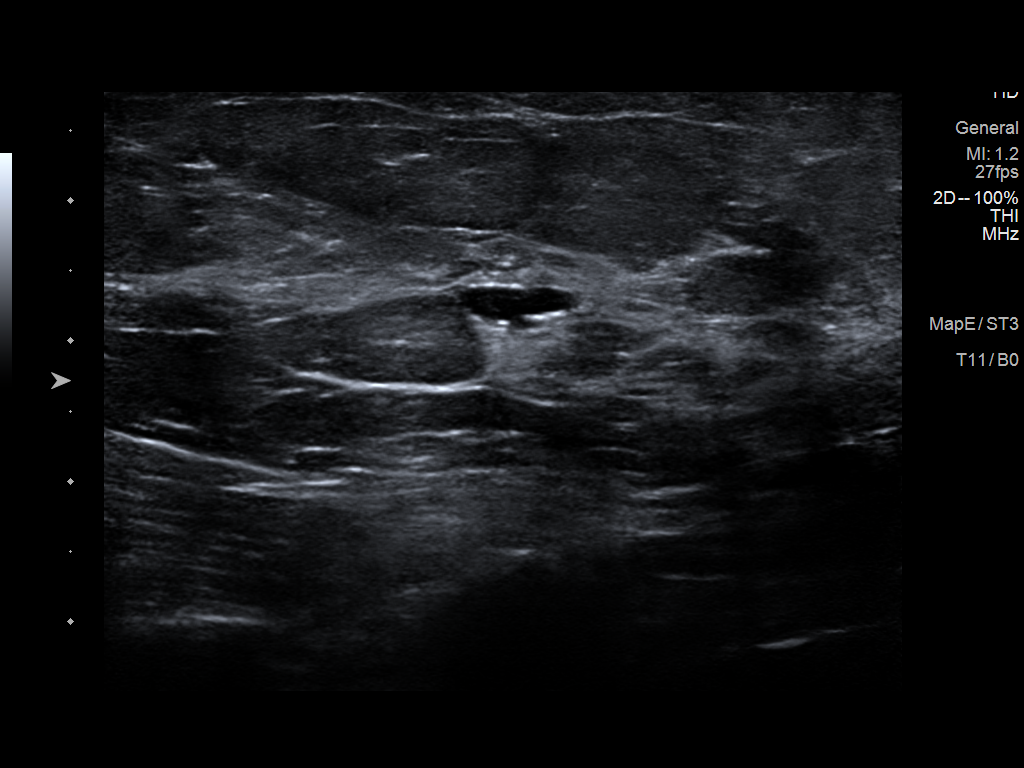
[im 6/7]
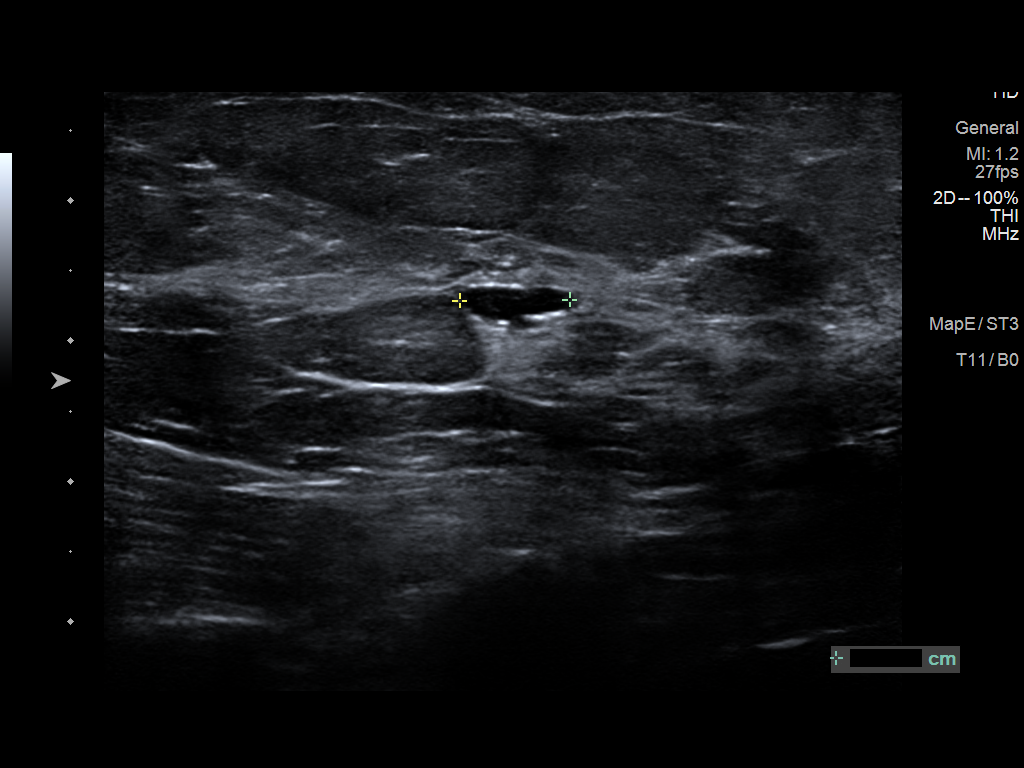
[im 7/7]
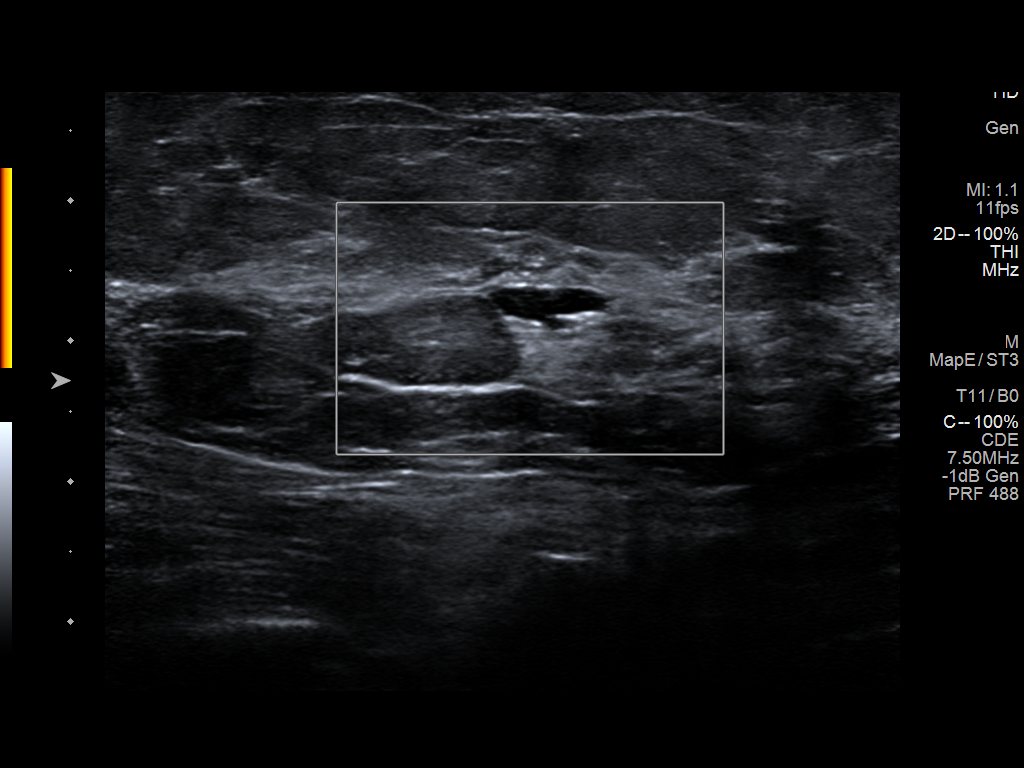

[7 of 7 positions shown; findings below may reference images not displayed]

ACR Breast Density Category b: There are scattered areas of
fibroglandular density.
FINDINGS: No suspicious mass, malignant type microcalcifications or distortion
detected in either breast. Spot tangential view of the area of
clinical concern in the left breast shows normal fibroglandular
tissue.

Mammographic images were processed with CAD.

On physical exam, I do not palpate a discrete mass in the patient's
area of clinical concern in the 2 o'clock region of the left breast.

Targeted ultrasound is performed, showing an anechoic cyst in left
breast at 2 o'clock 5 cm from the nipple measuring 8 x 4 x 8 mm. No
solid mass or abnormal shadowing detected.
IMPRESSION: No evidence of malignancy in either breast.  Left breast cyst.

RECOMMENDATION:
Bilateral screening mammogram in 1 year is recommended.

I have discussed the findings and recommendations with the patient.
If applicable, a reminder letter will be sent to the patient
regarding the next appointment.

BI-RADS CATEGORY  2: Benign.

## 2021-05-20 ENCOUNTER — Other Ambulatory Visit: Payer: Self-pay | Admitting: Physician Assistant

## 2021-05-20 ENCOUNTER — Ambulatory Visit (INDEPENDENT_AMBULATORY_CARE_PROVIDER_SITE_OTHER): Payer: BLUE CROSS/BLUE SHIELD

## 2021-05-20 ENCOUNTER — Other Ambulatory Visit: Payer: Self-pay

## 2021-05-20 DIAGNOSIS — M778 Other enthesopathies, not elsewhere classified: Secondary | ICD-10-CM

## 2021-05-20 DIAGNOSIS — Z79899 Other long term (current) drug therapy: Secondary | ICD-10-CM

## 2021-05-20 NOTE — Telephone Encounter (Signed)
Next Visit: 08/26/2021 ? ?Last Visit: 03/28/2021 ? ?Last Fill: 12/03/2020 ? ?DX: Rheumatoid arthritis of multiple sites with negative rheumatoid factor  ? ?Current Dose per office note 03/28/2021: Arava 20 mg 1 tablet by mouth daily  ? ?Labs: 02/19/2021 CBC and CMP WNL ? ?Left message to advise patient she is due to update labs.  ? ?Okay to refill Arava?  ?

## 2021-05-20 NOTE — Progress Notes (Signed)
SITUATION ?Reason for Consult: Evaluation for Bilateral Custom Foot Orthoses ?Patient / Caregiver Report: Patient is ready for foot orthotics ? ?OBJECTIVE DATA: ?Patient History / Diagnosis:  ?  ICD-10-CM   ?1. Capsulitis of left foot  M77.8   ?  ?2. Capsulitis of right foot  M77.8   ?  ? ? ?Current or Previous Devices: Current user, needs a new set ? ?Foot Examination: ?Skin presentation:   Intact ?Ulcers & Callousing:   None and no history ?Toe / Foot Deformities:  None ?Weight Bearing Presentation:  None ?Sensation:    Intact ? ?ORTHOTIC RECOMMENDATION ?Recommended Device: 1x pair of custom functional foot orthotics ? ?GOALS OF ORTHOSES ?- Reduce Pain ?- Prevent Foot Deformity ?- Prevent Progression of Further Foot Deformity ?- Relieve Pressure ?- Improve the Overall Biomechanical Function of the Foot and Lower Extremity. ? ?ACTIONS PERFORMED ?Patient was casted for Foot Orthoses via crush box. Procedure was explained and patient tolerated procedure well. All questions were answered and concerns addressed. ? ?PLAN ?Potential out of pocket cost was communicated to patient. Casts are to be sent to [facility] for fabrication. Patient is to be called for fitting when devices are ready.  ? ? ?

## 2021-05-23 DIAGNOSIS — F329 Major depressive disorder, single episode, unspecified: Secondary | ICD-10-CM | POA: Diagnosis not present

## 2021-05-27 ENCOUNTER — Other Ambulatory Visit: Payer: Self-pay | Admitting: *Deleted

## 2021-05-27 DIAGNOSIS — Z79899 Other long term (current) drug therapy: Secondary | ICD-10-CM

## 2021-05-28 ENCOUNTER — Other Ambulatory Visit: Payer: Self-pay | Admitting: *Deleted

## 2021-05-28 LAB — CBC WITH DIFFERENTIAL/PLATELET
Absolute Monocytes: 648 cells/uL (ref 200–950)
Basophils Absolute: 30 cells/uL (ref 0–200)
Basophils Relative: 0.5 %
Eosinophils Absolute: 30 cells/uL (ref 15–500)
Eosinophils Relative: 0.5 %
HCT: 39.4 % (ref 35.0–45.0)
Hemoglobin: 13.2 g/dL (ref 11.7–15.5)
Lymphs Abs: 2946 cells/uL (ref 850–3900)
MCH: 30.7 pg (ref 27.0–33.0)
MCHC: 33.5 g/dL (ref 32.0–36.0)
MCV: 91.6 fL (ref 80.0–100.0)
MPV: 11.3 fL (ref 7.5–12.5)
Monocytes Relative: 10.8 %
Neutro Abs: 2346 cells/uL (ref 1500–7800)
Neutrophils Relative %: 39.1 %
Platelets: 286 10*3/uL (ref 140–400)
RBC: 4.3 10*6/uL (ref 3.80–5.10)
RDW: 13 % (ref 11.0–15.0)
Total Lymphocyte: 49.1 %
WBC: 6 10*3/uL (ref 3.8–10.8)

## 2021-05-28 LAB — COMPLETE METABOLIC PANEL WITH GFR
AG Ratio: 1.9 (calc) (ref 1.0–2.5)
ALT: 31 U/L — ABNORMAL HIGH (ref 6–29)
AST: 26 U/L (ref 10–35)
Albumin: 4.7 g/dL (ref 3.6–5.1)
Alkaline phosphatase (APISO): 75 U/L (ref 37–153)
BUN: 11 mg/dL (ref 7–25)
CO2: 26 mmol/L (ref 20–32)
Calcium: 10.4 mg/dL (ref 8.6–10.4)
Chloride: 101 mmol/L (ref 98–110)
Creat: 0.74 mg/dL (ref 0.50–1.03)
Globulin: 2.5 g/dL (calc) (ref 1.9–3.7)
Glucose, Bld: 89 mg/dL (ref 65–99)
Potassium: 4.5 mmol/L (ref 3.5–5.3)
Sodium: 140 mmol/L (ref 135–146)
Total Bilirubin: 0.5 mg/dL (ref 0.2–1.2)
Total Protein: 7.2 g/dL (ref 6.1–8.1)
eGFR: 93 mL/min/{1.73_m2} (ref >=60)

## 2021-05-28 MED ORDER — LEFLUNOMIDE 20 MG PO TABS: 20.0000 mg | ORAL_TABLET | Freq: Every day | ORAL | 0 refills | Status: DC

## 2021-05-28 NOTE — Progress Notes (Signed)
CBC WNL.  ALT is borderline elevated-31. AST WNL.  Rest of CMP WNL.

## 2021-05-28 NOTE — Telephone Encounter (Signed)
Next Visit: 08/26/2021 ? ?Last Visit: 03/28/2021 ? ?Last Fill: 05/20/2021 - 30 day pending lab results ? ?DX: Rheumatoid arthritis of multiple sites with negative rheumatoid factor  ? ?Current Dose per office note 03/28/2021: Arava 20 mg 1 tablet by mouth daily  ? ?Labs: 05/27/2021, CBC WNL.  ALT is borderline elevated-31. AST WNL.  Rest of CMP WNL.   ? ?Okay to refill Arava? ? ?

## 2021-05-30 ENCOUNTER — Other Ambulatory Visit: Payer: Self-pay | Admitting: Rheumatology

## 2021-05-30 DIAGNOSIS — M0609 Rheumatoid arthritis without rheumatoid factor, multiple sites: Secondary | ICD-10-CM

## 2021-05-30 NOTE — Telephone Encounter (Signed)
Next Visit: 08/26/2021 ?  ?Last Visit: 03/28/2021 ?  ?Last Fill: 03/28/2021 ? ?DX: Rheumatoid arthritis of multiple sites with negative rheumatoid factor  ?  ?Current Dose per office note 03/28/2021: Rinvoq 15 mg 1 tablet by mouth daily ? ?Labs: 05/27/2021, CBC WNL.  ALT is borderline elevated-31. AST WNL.  Rest of CMP WNL.   ? ?TB Gold: 08/31/2020 Neg  ?  ?Okay to refill Rinvoq? ?

## 2021-06-02 DIAGNOSIS — F4321 Adjustment disorder with depressed mood: Secondary | ICD-10-CM | POA: Diagnosis not present

## 2021-06-13 ENCOUNTER — Encounter: Payer: Self-pay | Admitting: Podiatry

## 2021-06-13 DIAGNOSIS — F4321 Adjustment disorder with depressed mood: Secondary | ICD-10-CM | POA: Diagnosis not present

## 2021-06-13 DIAGNOSIS — D2272 Melanocytic nevi of left lower limb, including hip: Secondary | ICD-10-CM | POA: Diagnosis not present

## 2021-06-13 DIAGNOSIS — D2372 Other benign neoplasm of skin of left lower limb, including hip: Secondary | ICD-10-CM | POA: Diagnosis not present

## 2021-06-13 DIAGNOSIS — D492 Neoplasm of unspecified behavior of bone, soft tissue, and skin: Secondary | ICD-10-CM | POA: Diagnosis not present

## 2021-06-14 ENCOUNTER — Other Ambulatory Visit: Payer: Self-pay | Admitting: Family Medicine

## 2021-06-19 ENCOUNTER — Ambulatory Visit (INDEPENDENT_AMBULATORY_CARE_PROVIDER_SITE_OTHER): Payer: BLUE CROSS/BLUE SHIELD | Admitting: Podiatry

## 2021-06-19 ENCOUNTER — Encounter: Payer: BLUE CROSS/BLUE SHIELD | Admitting: Podiatry

## 2021-06-19 ENCOUNTER — Ambulatory Visit: Payer: BLUE CROSS/BLUE SHIELD

## 2021-06-19 ENCOUNTER — Encounter: Payer: Self-pay | Admitting: Podiatry

## 2021-06-19 DIAGNOSIS — M778 Other enthesopathies, not elsewhere classified: Secondary | ICD-10-CM

## 2021-06-19 DIAGNOSIS — D2372 Other benign neoplasm of skin of left lower limb, including hip: Secondary | ICD-10-CM | POA: Diagnosis not present

## 2021-06-19 NOTE — Progress Notes (Signed)
Subjective:  ? ?Patient ID: Sandra [name], female   DOB: 61 y.o.   MRN: [id number]  ? ?HPI ?Patient presents with well-healing surgical site plantar left from having excisional biopsy performed by Dr. Amalia Hailey last week ? ? ?ROS ? ? ?   ?Objective:  ?Physical Exam  ?Neurovascular status intact with incision intact plantar left stitches intact wound edges well coapted no drainage negative Homans' sign noted ? ?   ?Assessment:  ?Doing well post excisional biopsy plantar left with results not present yet ? ?   ?Plan:  ?Sterile dressing reapplied continue elevation surgical shoe usage reappoint 2 weeks suture removal earlier if necessary ?   ? ? ?

## 2021-06-19 NOTE — Progress Notes (Signed)
SITUATION: ?Reason for Visit: Fitting and Delivery of Custom Fabricated Foot Orthoses ?Patient Report: Patient reports comfort and is satisfied with device. ? ?OBJECTIVE DATA: ?Patient History / Diagnosis:   ?  ICD-10-CM   ?1. Capsulitis of left foot  M77.8   ?  ?2. Capsulitis of right foot  M77.8   ?  ? ? ?Provided Device:  Custom Functional Foot Orthotics ?    RicheyLAB: [id number] ? ?GOAL OF ORTHOSIS ?- Improve gait ?- Decrease energy expenditure ?- Improve Balance ?- Provide Triplanar stability of foot complex ?- Facilitate motion ? ?ACTIONS PERFORMED ?Patient was fit with foot orthotics trimmed to shoe last. Patient tolerated fittign procedure.  ? ?Patient was provided with verbal and written instruction and demonstration regarding donning, doffing, wear, care, proper fit, function, purpose, cleaning, and use of the orthosis and in all related precautions and risks and benefits regarding the orthosis. ? ?Patient was also provided with verbal instruction regarding how to report any failures or malfunctions of the orthosis and necessary follow up care. Patient was also instructed to contact our office regarding any change in status that may affect the function of the orthosis. ? ?Patient demonstrated independence with proper donning, doffing, and fit and verbalized understanding of all instructions. ? ?PLAN: ?Patient is to follow up in one week or as necessary (PRN). All questions were answered and concerns addressed. Plan of care was discussed with and agreed upon by the patient. ? ?

## 2021-06-20 DIAGNOSIS — F4321 Adjustment disorder with depressed mood: Secondary | ICD-10-CM | POA: Diagnosis not present

## 2021-06-26 ENCOUNTER — Encounter: Payer: BLUE CROSS/BLUE SHIELD | Admitting: Podiatry

## 2021-06-27 DIAGNOSIS — F4321 Adjustment disorder with depressed mood: Secondary | ICD-10-CM | POA: Diagnosis not present

## 2021-07-03 ENCOUNTER — Ambulatory Visit (INDEPENDENT_AMBULATORY_CARE_PROVIDER_SITE_OTHER): Payer: BLUE CROSS/BLUE SHIELD | Admitting: Podiatry

## 2021-07-03 DIAGNOSIS — Z9889 Other specified postprocedural states: Secondary | ICD-10-CM

## 2021-07-03 DIAGNOSIS — M778 Other enthesopathies, not elsewhere classified: Secondary | ICD-10-CM | POA: Diagnosis not present

## 2021-07-03 MED ORDER — BETAMETHASONE SOD PHOS & ACET 6 (3-3) MG/ML IJ SUSP
3.0000 mg | Freq: Once | INTRAMUSCULAR | Status: AC
  Administered 2021-07-03: 3 mg via INTRA_ARTICULAR

## 2021-07-03 NOTE — Progress Notes (Signed)
? ?  Subjective:  ?Patient presents today status post excision of benign skin lesion left plantar foot. DOS: 06/13/2021.  Patient doing very well.  She only has some slight tenderness to the incision site. ?Patient also states that she has had an acute flareup of bilateral midfoot pain.  She received an injection back in January 2023 which helped significantly for few months.  The pain is slowly returned over the last few weeks.  She is requesting injection today ? ?Past Medical History:  ?Diagnosis Date  ? Allergic rhinitis 05/31/2012  ? Chest pain   ? PT SAW CARDIOLOGIST DR. Burt Knack - STRESS ECHO DONE NEGATIVE - NO PROBLEM SINCE  ? Chicken pox as achild  ? Cough 11/29/2012  ? RESOLVED - IT WAS CAUSED BY LISINOPRIL  ? Elevated LFTs 05/31/2012  ? Eustachian tube dysfunction 07/27/2017  ? GERD (gastroesophageal reflux disease)   ? H/O tobacco use, presenting hazards to health 11/29/2012  ? Smoked 1 1/2 ppd for 20 years quit in roughtly 2007  ? Headache 11/25/2015  ? Hematuria 04/09/2014  ? Hoarseness 02/04/2012  ? RESOLVED - THOUGHT TO HAVE BEEN ALLERY RELATED  ? Hyperglycemia 04/06/2014  ? Hyperlipidemia   ? Hyperparathyroidism ([place])   ? Hypertension   ? Obesity, unspecified 05/31/2012  ? Ocular migraine   ? Overactive bladder 05/31/2012  ? Preventative health care 02/04/2012  ? PTSD (post-traumatic stress disorder)   ? Rapid heart beat   ? PT FEELS PROB RELATED TO HYPER PARATHYROIDISM  ? Rheumatoid arthritis ([place])   ? SUI (stress urinary incontinence, female) 02/04/2012  ? Sees Dr Perlie Gold and they have discussed a bladder tack but so far she declines  ? ?  ? ?Objective/Physical Exam ?Neurovascular status intact.  Skin incisions appear to be well coapted with sutures  intact. No sign of infectious process noted. No dehiscence. No active bleeding or drainage noted.  Negative for any significant edema noted to the surgical extremity. ? ? ?Assessment: ?1. s/p excision of benign skin lesion left plantar foot. DOS: 06/13/2021 ?2.   Bilateral midfoot capsulitis ? ? ?Plan of Care:  ?1. Patient was evaluated.  ?2.  Injection of 0.5 cc Celestone Soluspan injection of the bilateral midtarsal joint ?3.  Sutures removed. ?4.  Patient may discontinue the postsurgical shoe ?5.  Patient may resume full activity no restrictions.  Skin incision is healed ?6.  Return to clinic as needed ? ?Edrick Kins, DPM ?[facility] ? ?Dr. Edrick Kins, DPM  ?  ?[address]                                    ?[place], [place] [address]                ?Office [phone]  ?Fax [phone] ? ? ? ? ? ?

## 2021-07-07 DIAGNOSIS — F4321 Adjustment disorder with depressed mood: Secondary | ICD-10-CM | POA: Diagnosis not present

## 2021-07-08 ENCOUNTER — Ambulatory Visit: Payer: BLUE CROSS/BLUE SHIELD | Admitting: Family Medicine

## 2021-07-10 ENCOUNTER — Encounter: Payer: BLUE CROSS/BLUE SHIELD | Admitting: Podiatry

## 2021-07-17 ENCOUNTER — Telehealth: Payer: Self-pay | Admitting: Family Medicine

## 2021-07-17 NOTE — Telephone Encounter (Signed)
Pt stated she is trying to get her son, who is in the [employer], moved closer to home. She stated last time, Dr.Blyth wrote a letter to the [employer] to help. She stated this letter needs to be a bit more specific and would like to speak with Dr.Blyth or her nurse. Please advise.  ?

## 2021-07-18 DIAGNOSIS — F4321 Adjustment disorder with depressed mood: Secondary | ICD-10-CM | POA: Diagnosis not present

## 2021-07-20 ENCOUNTER — Encounter: Payer: Self-pay | Admitting: Internal Medicine

## 2021-07-20 NOTE — Progress Notes (Signed)
On call . Pt in [place]. Test pos for covid 19immunized. But was on immunosuppressants. Sx flu like malaise and respiratory, no resp distress. Asks for paxlovid rx. Cause of risk . RA and immunosuppressive meds recent. Day 2? Of symptoms . Gfr is 92. Ok to call in [place] paxlovid full dose 3 pills bid x days . Hold statin meds. Fu if worsening .

## 2021-07-22 ENCOUNTER — Telehealth: Payer: Self-pay | Admitting: *Deleted

## 2021-07-22 NOTE — Telephone Encounter (Signed)
Called pt, she will return call with details. ?

## 2021-07-22 NOTE — Telephone Encounter (Signed)
Spoke with pt, received details regarding letter. Will inform pt when letter is ready. ?

## 2021-07-22 NOTE — Telephone Encounter (Signed)
Patient contacted the office stating she has tested positive for Covid. Patient has been in touch with her PCP and has been given a prescription for Paxlovid. Patient states she has held her [place] [facility] [place]. Reviewed the following with patient.  ? ?If you test POSITIVE for COVID19 and have MILD to MODERATE symptoms: ?First, call your PCP if you would like to receive COVID19 treatment AND ?Hold your medications during the infection and for at least 1 week after your symptoms have resolved: ?Injectable medication (Benlysta, Cimzia, Cosentyx, Enbrel, Humira, Orencia, Remicade, Simponi, Stelara, Fanny Dance) ?Methotrexate ?Leflunomide Jolee Ewing) ?Azathioprine ?Mycophenolate (Cellcept) ?Roma Kayser, or Rinvoq ?Rutherford Nail ?If you take Actemra or Kevzara, you DO NOT need to hold these for COVID19 infection. ? ? ?

## 2021-07-22 NOTE — Telephone Encounter (Signed)
Pt called back to speak with Tomekia.   ?

## 2021-07-23 DIAGNOSIS — U071 COVID-19: Secondary | ICD-10-CM | POA: Diagnosis not present

## 2021-07-24 NOTE — Telephone Encounter (Signed)
Letter completed. Emailed to pt per request, hard copy also mailed to pt. ?

## 2021-08-08 DIAGNOSIS — F4321 Adjustment disorder with depressed mood: Secondary | ICD-10-CM | POA: Diagnosis not present

## 2021-08-12 IMAGING — DX DG CHEST 2V
2 series · 2 of 2 positions shown · non-contrast
Comparison: Chest radiographs 11/29/2012 and earlier.

CLINICAL DATA: 58-year-old female with shortness of breath.

EXAM:
CHEST - 2 VIEW

[chest pa]
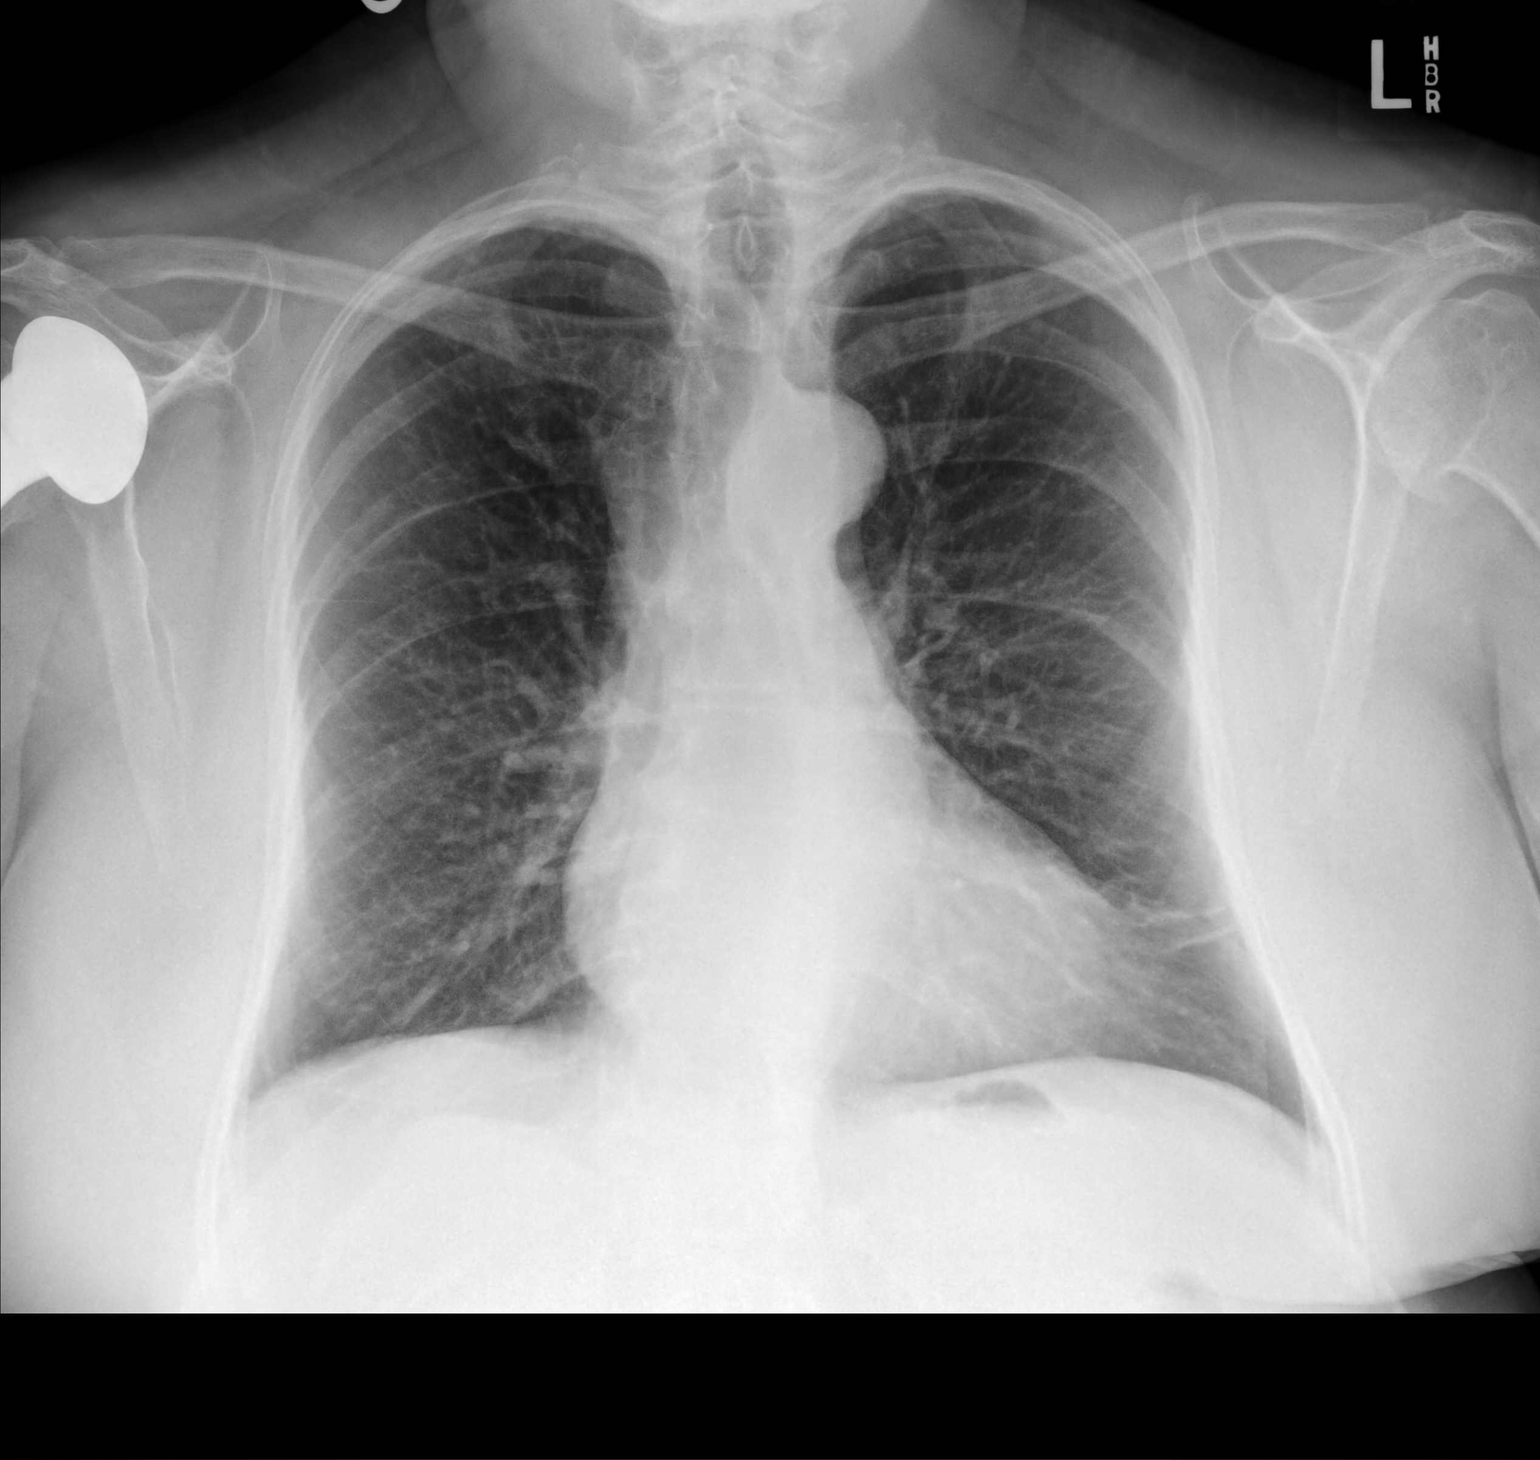

[chest lat]
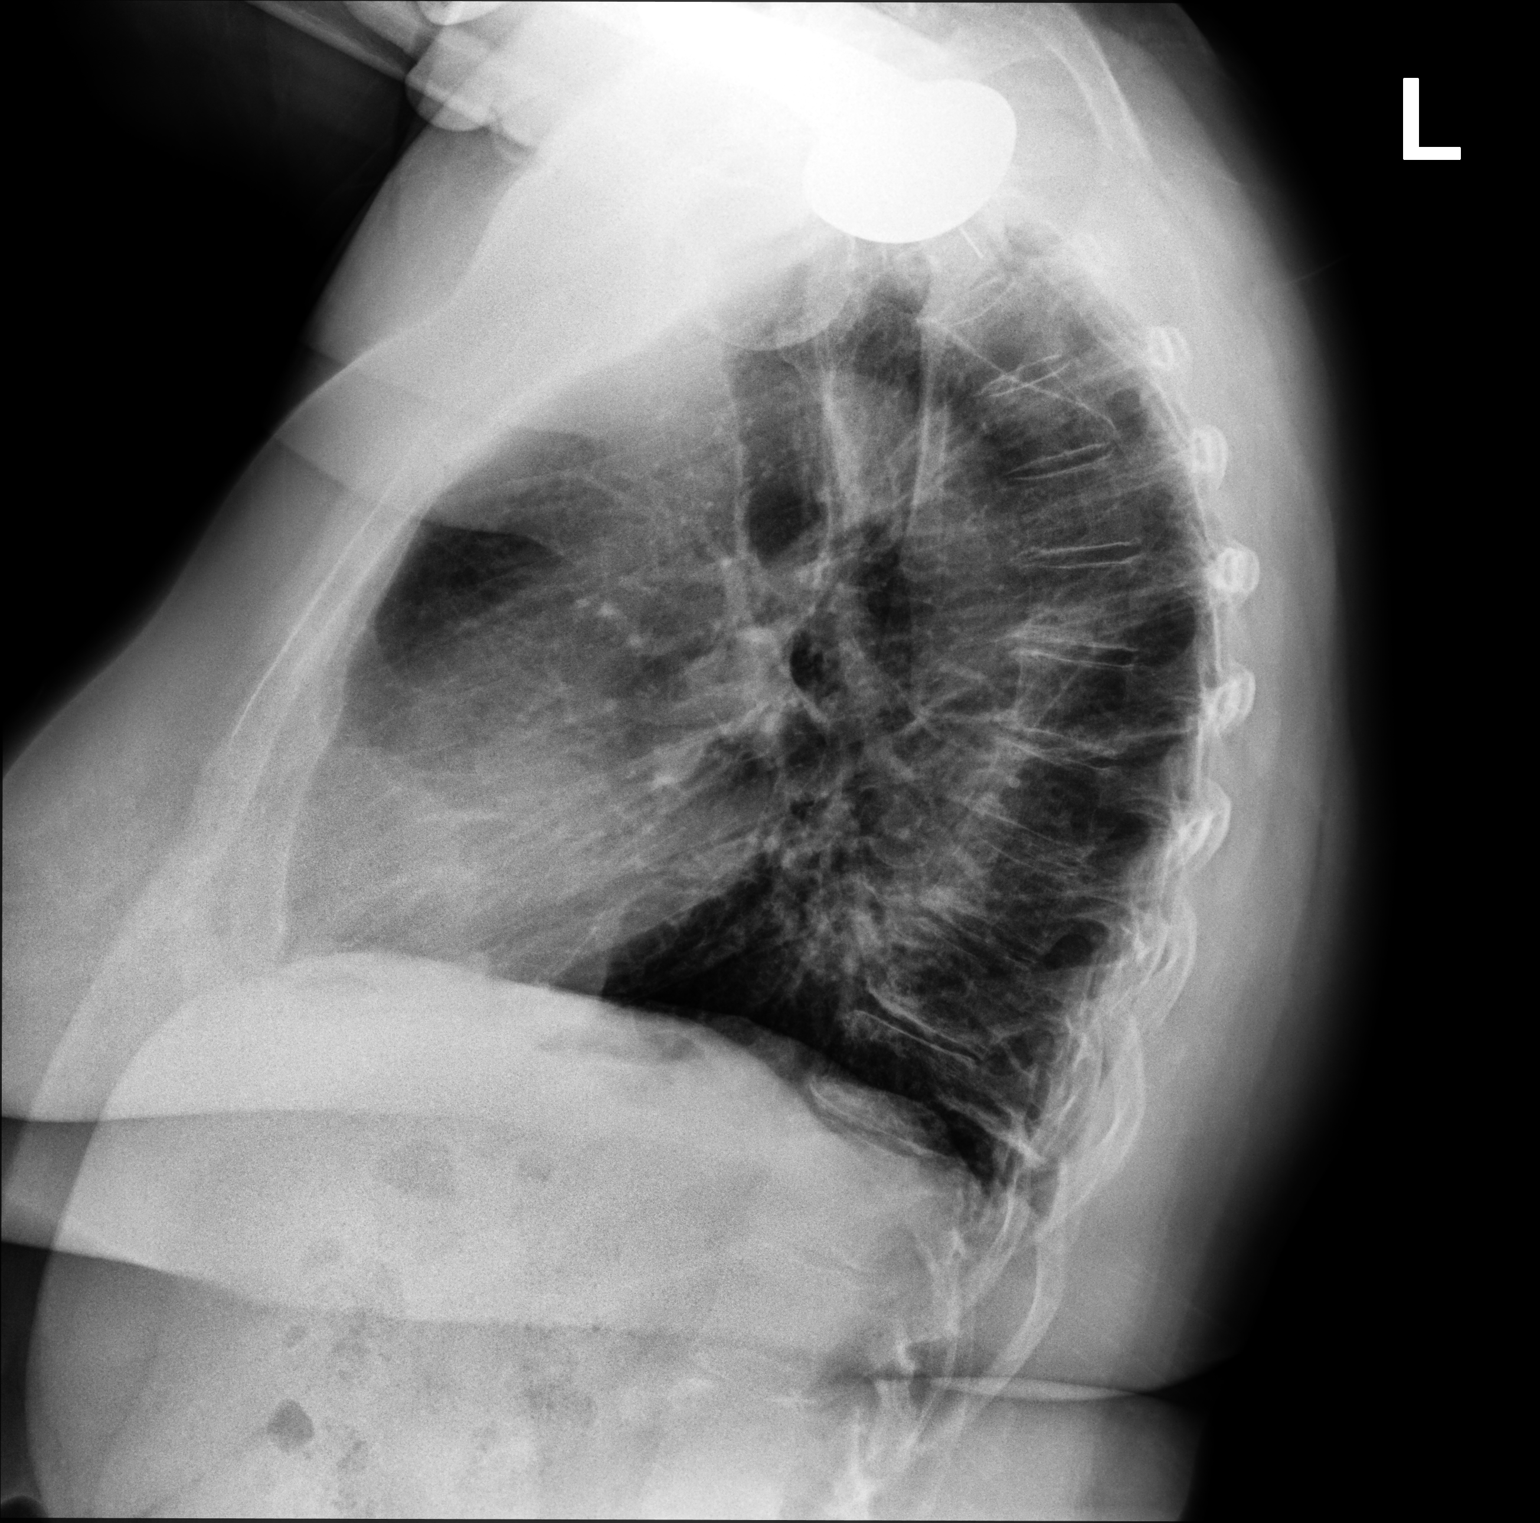

[2 of 2 positions shown; findings below may reference images not displayed]

FINDINGS: Lung volumes are stable at the upper limits of normal to mildly
hyperinflated. Mediastinal contours remain normal. Visualized
tracheal air column is within normal limits. No pneumothorax,
pulmonary edema, pleural effusion or confluent pulmonary opacity.

Interval right shoulder arthroplasty. No acute osseous abnormality
identified. Negative visible bowel gas pattern.
IMPRESSION: No acute cardiopulmonary abnormality.

## 2021-08-13 ENCOUNTER — Ambulatory Visit (INDEPENDENT_AMBULATORY_CARE_PROVIDER_SITE_OTHER): Payer: BLUE CROSS/BLUE SHIELD | Admitting: Family Medicine

## 2021-08-13 ENCOUNTER — Encounter: Payer: Self-pay | Admitting: Family Medicine

## 2021-08-13 VITALS — BP 124/80 | HR 74 | Temp 97.9°F | Resp 18 | Ht 65.0 in | Wt 197.2 lb

## 2021-08-13 DIAGNOSIS — R319 Hematuria, unspecified: Secondary | ICD-10-CM

## 2021-08-13 LAB — POC URINALSYSI DIPSTICK (AUTOMATED)
Bilirubin, UA: NEGATIVE
Blood, UA: NEGATIVE
Glucose, UA: NEGATIVE
Ketones, UA: NEGATIVE
Leukocytes, UA: NEGATIVE
Nitrite, UA: NEGATIVE
Protein, UA: NEGATIVE
Spec Grav, UA: <=1.005 — AB (ref 1.010–1.025)
Urobilinogen, UA: 0.2 E.U./dL
pH, UA: 6 (ref 5.0–8.0)

## 2021-08-13 NOTE — Progress Notes (Signed)
Office Visit Note  Patient: Sandra [name]             Date of Birth: [birth year]           MRN: [id number]             PCP: Sandra Lukes, MD Referring: Sandra Lukes, MD Visit Date: 08/26/2021 Occupation: '@GUAROCC'$ @  Subjective:  Medication monitoring   History of Present Illness: Sandra [name] is a 61 y.o. female with history seronegative rheumatoid arthritis, osteoarthritis, and osteopenia.  Patient remains on Rinvoq 15 mg 1 tablet by mouth daily and Arava 20 mg 1 tablet by mouth daily.  She is tolerating both medications without any side effects.  She states he was diagnosed with COVID-19 in May 2023.  She states she held her invoke and [place] for about 1 month and resumed therapy about 1 week ago.  She denies a flare while being off of her medications.  She states she took Paxlovid and her symptoms resolved.  She denies any recurrent infections recently.  She states she continues to have stiffness in her hands first thing in the morning but overall her pain level has been tolerable.  She denies any joint swelling currently.  She has not needed to take any over-the-counter products.  She has been on Rinvoq and Arava to be effective at managing her rheumatoid arthritis.  She states she was evaluated by Sandra [name] for pain in both feet.  She was given orthotics as well as injections on the dorsal aspect of both feet in April 2023 which provided significant relief.  She continues to have some difficulty sleeping at night and has been taking melatonin at bedtime. She has an upcoming gynecology appointment on Wednesday as well as her primary care physical on 09/03/2021.   Activities of Daily Living:  Patient reports morning stiffness for 2 hours.   Patient Denies nocturnal pain.  Difficulty dressing/grooming: Denies Difficulty climbing stairs: Denies Difficulty getting out of chair: Denies Difficulty using hands for taps, buttons, cutlery, and/or writing: Reports  Review of Systems   Constitutional:  Positive for fatigue.  HENT:  Positive for mouth dryness. Negative for mouth sores and nose dryness.   Eyes:  Negative for pain, visual disturbance and dryness.  Respiratory:  Negative for cough, hemoptysis, shortness of breath and difficulty breathing.   Cardiovascular:  Negative for chest pain, palpitations, hypertension and swelling in legs/feet.  Gastrointestinal:  Negative for blood in stool, constipation and diarrhea.  Genitourinary:  Negative for difficulty urinating and painful urination.  Musculoskeletal:  Positive for joint swelling and morning stiffness. Negative for joint pain, joint pain, myalgias, muscle weakness, muscle tenderness and myalgias.  Skin:  Negative for color change, pallor, rash, hair loss, nodules/bumps, skin tightness, ulcers and sensitivity to sunlight.  Allergic/Immunologic: Negative for susceptible to infections.  Neurological:  Positive for weakness. Negative for dizziness, numbness and headaches.  Hematological:  Positive for bruising/bleeding tendency. Negative for swollen glands.  Psychiatric/Behavioral:  Positive for sleep disturbance. Negative for depressed mood. The patient is not nervous/anxious.     PMFS History:  Patient Active Problem List   Diagnosis Date Noted   Pulmonary nodule 03/29/2021   Back pain 12/13/2019   Rheumatoid arthritis (Sandra [name]) 12/02/2019   Anxiety and depression 08/01/2019   Arthralgia 05/04/2019   OSA (obstructive sleep apnea) 05/03/2019   Thyroiditis 12/20/2018   Muscle cramp 12/20/2018   Eustachian tube dysfunction 07/27/2017   Headache 11/25/2015   Acute bacterial sinusitis 03/26/2015  Hematuria 04/09/2014   Hyperglycemia 04/06/2014   Diarrhea 09/21/2013   Hyperparathyroidism, primary (Sandra [name]) 07/12/2013   Dyspnea 12/28/2012   Hypercalcemia 12/16/2012   H/O tobacco use, presenting hazards to health 11/29/2012   Overactive bladder 05/31/2012   Elevated LFTs 05/31/2012   Obesity (BMI 30-39.9)  05/31/2012   Allergic rhinitis 05/31/2012   Hoarseness 02/04/2012   Colon cancer screening 02/04/2012   SUI (stress urinary incontinence, female) 02/04/2012   Migraine    GERD (gastroesophageal reflux disease)    Rotator cuff syndrome of right shoulder 06/11/2011   Atypical chest pain 01/03/2011   Hypertension 01/03/2011   Hyperlipidemia 01/03/2011   KNEE PAIN, BILATERAL 09/28/2008   ANSERINE BURSITIS 09/28/2008   CAVUS DEFORMITY OF FOOT, ACQUIRED 09/28/2008    Past Medical History:  Diagnosis Date   Allergic rhinitis 05/31/2012   Chest pain    PT SAW CARDIOLOGIST DR. Burt [name] - STRESS ECHO DONE NEGATIVE - NO PROBLEM SINCE   Chicken pox as achild   Cough 11/29/2012   RESOLVED - IT WAS CAUSED BY LISINOPRIL   Elevated LFTs 05/31/2012   Eustachian tube dysfunction 07/27/2017   GERD (gastroesophageal reflux disease)    H/O tobacco use, presenting hazards to health 11/29/2012   Smoked 1 1/2 ppd for 20 years quit in roughtly 2007   Headache 11/25/2015   Hematuria 04/09/2014   Hoarseness 02/04/2012   RESOLVED - THOUGHT TO HAVE BEEN ALLERY RELATED   Hyperglycemia 04/06/2014   Hyperlipidemia    Hyperparathyroidism (Sandra [name])    Hypertension    Obesity, unspecified 05/31/2012   Ocular migraine    Overactive bladder 05/31/2012   Preventative health care 02/04/2012   PTSD (post-traumatic stress disorder)    Rapid heart beat    PT FEELS PROB RELATED TO HYPER PARATHYROIDISM   Rheumatoid arthritis (Sandra [name])    SUI (stress urinary incontinence, female) 02/04/2012   Sees Dr Sandra [name] and they have discussed a bladder tack but so far she declines    Family History  Problem Relation Age of Onset   Hypertension Mother 52       alive   Renal cancer Mother        renal   Hyperlipidemia Mother    Osteoporosis Mother        humerus    Macular degeneration Mother    Tremor Mother    Parkinson's disease Mother    Hypertension Father 24       alive   Atrial fibrillation Father    Colon cancer Father         colon   Hyperlipidemia Father    Stroke Father    Alzheimer's disease Father    Hypertension Sister    Hypertension Brother 61       alive   Tremor Brother    Asthma Son    Eczema Son    Healthy Son    Heart disease Paternal Aunt    Heart disease Paternal Uncle    Lung cancer Maternal Grandfather        lung/ smoker   Emphysema Maternal Grandfather    Heart disease Paternal Grandmother    Other Paternal Grandfather        black lung   Heart disease Paternal Grandfather    Past Surgical History:  Procedure Laterality Date   BREAST EXCISIONAL BIOPSY Left    Age 7 Fibroadenoma   BREAST SURGERY     left breast excision of adenoma   btl     COLONOSCOPY  07/2020  FOOT SURGERY     PARATHYROIDECTOMY Right 08/12/2013   Procedure: right inferior frozen section PARATHYROIDECTOMY;  Surgeon: Earnstine Regal, MD;  Location: [facility];  Service: General;  Laterality: Right;   PELVIC LAPAROSCOPY  03/2004   TOTAL SHOULDER REPLACEMENT Right 01/2019   TUBAL LIGATION     WISDOM TOOTH EXTRACTION  30 yrs ago   Social History   Social History Narrative   Not on file   Immunization History  Administered Date(s) Administered   Hepatitis A, Adult 09/04/2016, 03/27/2017   Influenza Split 12/09/2011   Influenza,inj,Quad PF,6+ Mos 11/29/2012, 11/01/2013, 11/20/2015, 12/29/2017, 04/03/2019, 12/13/2019, 03/28/2021   PFIZER(Purple Top)SARS-COV-2 Vaccination 05/20/2019, 06/15/2019, 01/11/2020   Pneumococcal Conjugate-13 03/28/2021   Pneumococcal Polysaccharide-23 08/01/2019   Tdap 06/07/2015   Zoster Recombinat (Shingrix) 08/01/2019, 10/03/2020     Objective: Vital Signs: BP (!) 174/90 (BP Location: Left Arm, Patient Position: Sitting, Cuff Size: Normal)   Pulse 85   Resp 15   Ht '5\' 5"'$  (1.651 m)   Wt 197 lb (89.4 kg)   LMP 04/01/2011   BMI 32.78 kg/m    Physical Exam Vitals and nursing note reviewed.  Constitutional:      Appearance: She is well-developed.  HENT:     Head:  Normocephalic and atraumatic.  Eyes:     Conjunctiva/sclera: Conjunctivae normal.  Cardiovascular:     Rate and Rhythm: Normal rate and regular rhythm.     Heart sounds: Normal heart sounds.  Pulmonary:     Effort: Pulmonary effort is normal.     Breath sounds: Normal breath sounds.  Abdominal:     General: Bowel sounds are normal.     Palpations: Abdomen is soft.  Musculoskeletal:     Cervical back: Normal range of motion.  Skin:    General: Skin is warm and dry.     Capillary Refill: Capillary refill takes less than 2 seconds.  Neurological:     Mental Status: She is alert and oriented to person, place, and time.  Psychiatric:        Behavior: Behavior normal.      Musculoskeletal Exam: C-spine, thoracic spine, lumbar spine have good range of motion.  No midline spinal tenderness or SI joint tenderness.  Right shoulder replacement has good ROM.  Left shoulder joint has good ROM.  Elbow joints, wrist joints, MCPs, PIPs, DIPs have good range of motion with no synovitis.  PIP and DIP thickening consistent with osteoarthritis of both hands.  Hip joints have good range of motion with no groin pain.  Knee joints have good range of motion with no warmth or effusion.  Ankle joints have good range of motion with no tenderness or joint swelling.  No tenderness over MTP joints.  CDAI Exam: CDAI Score: 0.8  Patient Global: 4 mm; Provider Global: 4 mm Swollen: 0 ; Tender: 0  Joint Exam 08/26/2021   No joint exam has been documented for this visit   There is currently no information documented on the homunculus. Go to the Rheumatology activity and complete the homunculus joint exam.  Investigation: No additional findings.  Imaging: No results found.  Recent Labs: Lab Results  Component Value Date   WBC 6.0 05/27/2021   HGB 13.2 05/27/2021   PLT 286 05/27/2021   NA 140 05/27/2021   K 4.5 05/27/2021   CL 101 05/27/2021   CO2 26 05/27/2021   GLUCOSE 89 05/27/2021   BUN 11  05/27/2021   CREATININE 0.74 05/27/2021   BILITOT 0.5 05/27/2021  ALKPHOS 88 11/01/2020   AST 26 05/27/2021   ALT 31 (H) 05/27/2021   PROT 7.2 05/27/2021   ALBUMIN 4.7 11/01/2020   CALCIUM 10.4 05/27/2021   GFRAA 117 08/31/2020   QFTBGOLDPLUS NEGATIVE 08/31/2020    Speciality Comments: Orencia stoped 10/04/20 Rinvoq started 10/11/20  Procedures:  No procedures performed Allergies: Cefdinir, Doxycycline, Codeine, Penicillins, Simvastatin, and Other   Assessment / Plan:     Visit Diagnoses: Rheumatoid arthritis of multiple sites with negative rheumatoid factor ([place]): She has no joint tenderness or synovitis on examination today.  She has not had any signs or symptoms of a rheumatoid arthritis flare.  She has clinically been doing well taking Arava 20 mg 1 tablet by mouth daily and Rinvoq 15 mg 1 tablet daily.  She is tolerating both medications without any recurrent infections or side effects.  She was diagnosed with COVID-19 in May 2023 and was treated with Paxlovid.  She held rinvoq and [place] for about 1 month and resumed therapy about 1 week ago.  She did not notice any increased joint pain or inflammation while off of therapy.  She continues to have joint stiffness in both hands first thing in the morning due to underlying osteoarthritis.  She has not had any nocturnal pain.  She has not needed to take any over-the-counter products for symptomatic relief.  She will remain on Arava and rinvoq as combination therapy.  She was advised to notify us if she develops signs or symptoms of a flare.  She will follow-up in the office in 3 months or sooner if needed.  High risk medication use - Arava 20 mg 1 tablet by mouth daily and Rinvoq 15 mg 1 tablet by mouth daily.  Rinvoq added on at the end of July 2022.  CBC and CMP were drawn on 05/27/2021.  Orders for CBC and CMP were released today.  Her next lab work will be due in September and every 3 months to monitor for drug toxicity.  TB [name] negative  on 08/31/2020.  Order for TB [name] released today.- Plan: COMPLETE METABOLIC PANEL WITH GFR, CBC with Differential/Platelet, QuantiFERON-TB [name] Plus Discussed the importance of holding [place] and Rinvoq if she develops signs or symptoms of infection and to resume once the infection is completely cleared.  She was diagnosed with COVID-19 in May 2023 at which time she was prescribed Paxlovid.  She held [place] for 1 month until her symptoms have completely resolved.  She has not had any recurrent infections.  Osteopenia of multiple sites - DEXA updated on 08/12/19: The BMD measured at Forearm Radius 33% is 0.751 g/cm2 with a T-score of -1.4.  Calcium rich diet was discussed.  She is on vitamin D supplement daily.  An order an updated bone density was placed today.  We will discuss with the results once updated.- Plan: DG BONE DENSITY (DXA)  Status post total shoulder replacement, right: Doing well.  Good range of motion with no discomfort at this time.  Primary osteoarthritis of both hands: She has PIP and DIP thickening consistent with osteoarthritis of both hands.  No inflammation was noted.  She continues to have morning stiffness lasting about 2 hours daily.  No nocturnal symptoms.  Discussed the importance of joint protection and muscle strengthening.  She was encouraged to perform hand exercises daily.  Primary osteoarthritis of both feet: She has PIP and DIP thickening consistent with osteoarthritis of both feet.  She has good range of motion of both ankle  joints on examination today with no warmth or synovitis.  She was evaluated by Sandra [name] and was fitted for orthotics.  She also had injections on the dorsal aspect of both feet in April 2023 which provided significant relief.  Other medical conditions are listed as follows:  Hyperparathyroidism, primary ([place])  Anxiety and depression  Hashimoto's thyroiditis  Essential hypertension  History of gastroesophageal reflux  (GERD)  History of miscarriage  History of hyperlipidemia  Hx of migraines  Former smoker  Orders: Orders Placed This Encounter  Procedures   DG BONE DENSITY (DXA)   COMPLETE METABOLIC PANEL WITH GFR   CBC with Differential/Platelet   QuantiFERON-TB [name] Plus   No orders of the defined types were placed in this encounter.    Follow-Up Instructions: Return in 3 months (on 11/26/2021) for Rheumatoid arthritis.   Ofilia Neas, PA-C  Note - This record has been created using Dragon software.  Chart creation errors have been sought, but may not always  have been located. Such creation errors do not reflect on  the standard of medical care.

## 2021-08-13 NOTE — Assessment & Plan Note (Signed)
Resolved Culture pending Has f/u pcp soon

## 2021-08-13 NOTE — Progress Notes (Signed)
Subjective:   By signing my name below, I, Shehryar Baig, attest that this documentation has been prepared under the direction and in the presence of Ann Held, DO  08/13/2021    Patient ID: Sandra [name], female    DOB: 10-11-1960, 61 y.o.   MRN: [id number]  Chief Complaint  Patient presents with   Hematuria    Pt states seeing blood in her urine on Sunday night and Monday morning. Pt states blood has gone away and states no other sxs.     Hematuria Irritative symptoms do not include frequency. Pertinent negatives include no abdominal pain or dysuria.  Patient is in today for a office visit.   She found blood in her urine on Sunday night and Monday morning. She also passed a small clot alongside finding blood in her urine. She has not found blood in her urine since. She does not have menstrual cycles anymore. She denies having any back pain, dysuria, frequency, abdominal pain. She was not exercising prior to this incident.    Past Medical History:  Diagnosis Date   Allergic rhinitis 05/31/2012   Chest pain    PT SAW CARDIOLOGIST DR. Burt Knack - STRESS ECHO DONE NEGATIVE - NO PROBLEM SINCE   Chicken pox as achild   Cough 11/29/2012   RESOLVED - IT WAS CAUSED BY LISINOPRIL   Elevated LFTs 05/31/2012   Eustachian tube dysfunction 07/27/2017   GERD (gastroesophageal reflux disease)    H/O tobacco use, presenting hazards to health 11/29/2012   Smoked 1 1/2 ppd for 20 years quit in roughtly 2007   Headache 11/25/2015   Hematuria 04/09/2014   Hoarseness 02/04/2012   RESOLVED - THOUGHT TO HAVE BEEN ALLERY RELATED   Hyperglycemia 04/06/2014   Hyperlipidemia    Hyperparathyroidism ([place])    Hypertension    Obesity, unspecified 05/31/2012   Ocular migraine    Overactive bladder 05/31/2012   Preventative health care 02/04/2012   PTSD (post-traumatic stress disorder)    Rapid heart beat    PT FEELS PROB RELATED TO HYPER PARATHYROIDISM   Rheumatoid arthritis (HCC)    SUI  (stress urinary incontinence, female) 02/04/2012   Sees Dr Perlie Gold and they have discussed a bladder tack but so far she declines    Past Surgical History:  Procedure Laterality Date   BREAST EXCISIONAL BIOPSY Left    Age 71 Fibroadenoma   BREAST SURGERY     left breast excision of adenoma   btl     COLONOSCOPY  07/2020   PARATHYROIDECTOMY Right 08/12/2013   Procedure: right inferior frozen section PARATHYROIDECTOMY;  Surgeon: Earnstine Regal, MD;  Location: [facility];  Service: General;  Laterality: Right;   PELVIC LAPAROSCOPY  03/2004   TOTAL SHOULDER REPLACEMENT Right 01/2019   TUBAL LIGATION     WISDOM TOOTH EXTRACTION  73 yrs ago    Family History  Problem Relation Age of Onset   Hypertension Mother 43       alive   Renal cancer Mother        renal   Hyperlipidemia Mother    Osteoporosis Mother        humerus    Macular degeneration Mother    Tremor Mother    Parkinson's disease Mother    Hypertension Father 6       alive   Atrial fibrillation Father    Colon cancer Father        colon   Hyperlipidemia Father    Stroke  Father    Alzheimer's disease Father    Hypertension Sister    Hypertension Brother 81       alive   Tremor Brother    Asthma Son    Eczema Son    Healthy Son    Heart disease Paternal Aunt    Heart disease Paternal Uncle    Lung cancer Maternal Grandfather        lung/ smoker   Emphysema Maternal Grandfather    Heart disease Paternal Grandmother    Other Paternal Grandfather        black lung   Heart disease Paternal Grandfather     Social History   Socioeconomic History   Marital status: Married    Spouse name: Not on file   Number of children: 2   Years of education: Not on file   Highest education level: Not on file  Occupational History   Occupation: Programmer, multimedia: [employer]  Tobacco Use   Smoking status: Former    Packs/day: 1.00    Years: 20.00    Pack years: 20.00    Types: Cigarettes    Quit date: 03/10/2005     Years since quitting: 16.4   Smokeless tobacco: Never  Vaping Use   Vaping Use: Never used  Substance and Sexual Activity   Alcohol use: Not Currently   Drug use: No   Sexual activity: Yes    Partners: Male  Other Topics Concern   Not on file  Social History Narrative   Not on file   Social Determinants of Health   Financial Resource Strain: Not on file  Food Insecurity: Not on file  Transportation Needs: Not on file  Physical Activity: Not on file  Stress: Not on file  Social Connections: Not on file  Intimate Partner Violence: Not on file    Outpatient Medications Prior to Visit  Medication Sig Dispense Refill   ALPRAZolam (XANAX) 0.25 MG tablet as needed.     aspirin 325 MG tablet Take 325 mg by mouth daily.     atorvastatin (LIPITOR) 40 MG tablet TAKE 1.5 TABLETS BY MOUTH DAILY. 135 tablet 1   Cholecalciferol 100 MCG (4000 UT) CAPS Vitamin D3 100 mcg (4,000 unit) capsule  Take by oral route.     Co-Enzyme Q-10 30 MG CAPS Take 100 mg by mouth at bedtime.     DOCOSAHEXAENOIC ACID PO Take 1 tablet by mouth daily.     escitalopram (LEXAPRO) 10 MG tablet TAKE 1 TABLET BY MOUTH EVERY DAY 90 tablet 1   furosemide (LASIX) 20 MG tablet TAKE 1 TABLET BY MOUTH EVERY DAY 90 tablet 1   Ginger, Zingiber officinalis, (GINGER PO) Take by mouth.     irbesartan (AVAPRO) 150 MG tablet TAKE 1 TABLET BY MOUTH EVERY DAY 90 tablet 1   KRILL OIL PO krill oil     lactobacillus acidophilus (BACID) TABS tablet Take 2 tablets by mouth 3 (three) times daily.     leflunomide (ARAVA) 20 MG tablet Take 1 tablet (20 mg total) by mouth daily. 90 tablet 0   levothyroxine (SYNTHROID) 75 MCG tablet Take 1 tablet (75 mcg total) by mouth daily before breakfast. 30 tablet 0   metoprolol succinate (TOPROL-XL) 25 MG 24 hr tablet TAKE 1 TABLET (25 MG TOTAL) BY MOUTH DAILY. 90 tablet 1   Omega-3 Fatty Acids (OMEGA 3 PO) Take by mouth.     omeprazole (PRILOSEC) 40 MG capsule TAKE 1 CAPSULE BY MOUTH EVERY DAY  90 capsule 3   OVER THE COUNTER MEDICATION Take 1 capsule by mouth daily. Mega red- 1 capsule daily     RINVOQ 15 MG TB24 TAKE 1 TABLET BY MOUTH EVERY DAY 90 tablet 0   TART CHERRY PO Take by mouth.     Trospium Chloride 60 MG CP24 trospium ER 60 mg capsule,extended release 24 hr  TAKE 1 CAPSULE BY MOUTH EVERY DAY     TURMERIC PO Take 1 tablet by mouth 2 (two) times daily.     verapamil (CALAN-SR) 120 MG CR tablet TAKE 1 TABLET BY MOUTH EVERYDAY AT BEDTIME 90 tablet 2   No facility-administered medications prior to visit.    Allergies  Allergen Reactions   Cefdinir Other (See Comments) and Nausea And Vomiting    Severe Stomach cramps and diarrhea for several days.   Doxycycline Diarrhea   Codeine Nausea And Vomiting   Penicillins     RASH, ITCHING   Simvastatin     myalgias   Other Rash    Review of Systems  Gastrointestinal:  Negative for abdominal pain.  Genitourinary:  Positive for hematuria. Negative for dysuria and frequency.  Musculoskeletal:  Negative for back pain.      Objective:    Physical Exam Constitutional:      General: She is not in acute distress.    Appearance: Normal appearance. She is not ill-appearing.  HENT:     Head: Normocephalic and atraumatic.     Right Ear: External ear normal.     Left Ear: External ear normal.  Eyes:     Extraocular Movements: Extraocular movements intact.     Pupils: Pupils are equal, round, and reactive to light.  Cardiovascular:     Rate and Rhythm: Normal rate and regular rhythm.     Heart sounds: Normal heart sounds. No murmur heard.   No gallop.  Pulmonary:     Effort: Pulmonary effort is normal. No respiratory distress.     Breath sounds: Normal breath sounds. No wheezing or rales.  Abdominal:     Palpations: Abdomen is soft.     Tenderness: There is no abdominal tenderness.  Skin:    General: Skin is warm and dry.  Neurological:     Mental Status: She is alert and oriented to person, place, and time.   Psychiatric:        Judgment: Judgment normal.    BP 124/80 (BP Location: Right Arm, Patient Position: Sitting, Cuff Size: Large)   Pulse 74   Temp 97.9 F (36.6 C) (Oral)   Resp 18   Ht $R'5\' 5"'tO$  (1.651 m)   Wt 197 lb 3.2 oz (89.4 kg)   LMP 04/01/2011   SpO2 94%   BMI 32.82 kg/m  Wt Readings from Last 3 Encounters:  08/13/21 197 lb 3.2 oz (89.4 kg)  03/28/21 193 lb (87.5 kg)  03/28/21 193 lb 3.2 oz (87.6 kg)    Diabetic Foot Exam - Simple   No data filed    Lab Results  Component Value Date   WBC 6.0 05/27/2021   HGB 13.2 05/27/2021   HCT 39.4 05/27/2021   PLT 286 05/27/2021   GLUCOSE 89 05/27/2021   CHOL 258 (H) 11/08/2020   TRIG 118 11/08/2020   HDL 71 11/08/2020   LDLDIRECT 112.0 05/29/2020   LDLCALC 163 (H) 11/08/2020   ALT 31 (H) 05/27/2021   AST 26 05/27/2021   NA 140 05/27/2021   K 4.5 05/27/2021   CL 101 05/27/2021  CREATININE 0.74 05/27/2021   BUN 11 05/27/2021   CO2 26 05/27/2021   TSH 1.39 05/29/2020   HGBA1C 5.7 05/29/2020    Lab Results  Component Value Date   TSH 1.39 05/29/2020   Lab Results  Component Value Date   WBC 6.0 05/27/2021   HGB 13.2 05/27/2021   HCT 39.4 05/27/2021   MCV 91.6 05/27/2021   PLT 286 05/27/2021   Lab Results  Component Value Date   NA 140 05/27/2021   K 4.5 05/27/2021   CO2 26 05/27/2021   GLUCOSE 89 05/27/2021   BUN 11 05/27/2021   CREATININE 0.74 05/27/2021   BILITOT 0.5 05/27/2021   ALKPHOS 88 11/01/2020   AST 26 05/27/2021   ALT 31 (H) 05/27/2021   PROT 7.2 05/27/2021   ALBUMIN 4.7 11/01/2020   CALCIUM 10.4 05/27/2021   ANIONGAP 10 11/01/2020   EGFR 93 05/27/2021   GFR 97.83 05/29/2020   Lab Results  Component Value Date   CHOL 258 (H) 11/08/2020   Lab Results  Component Value Date   HDL 71 11/08/2020   Lab Results  Component Value Date   LDLCALC 163 (H) 11/08/2020   Lab Results  Component Value Date   TRIG 118 11/08/2020   Lab Results  Component Value Date   CHOLHDL 3.6  11/08/2020   Lab Results  Component Value Date   HGBA1C 5.7 05/29/2020       Assessment & Plan:   Problem List Items Addressed This Visit       Unprioritized   Hematuria - Primary    Resolved Culture pending Has f/u pcp soon        Relevant Orders   POCT Urinalysis Dipstick (Automated) (Completed)   Urine Culture     No orders of the defined types were placed in this encounter.   IAnn Held, DO, personally preformed the services described in this documentation.  All medical record entries made by the scribe were at my direction and in my presence.  I have reviewed the chart and discharge instructions (if applicable) and agree that the record reflects my personal performance and is accurate and complete. 08/13/2021   I,Shehryar Baig,acting as a scribe for Ann Held, DO.,have documented all relevant documentation on the behalf of Ann Held, DO,as directed by  Ann Held, DO while in the presence of Ann Held, DO.   Ann Held, DO

## 2021-08-13 NOTE — Patient Instructions (Signed)

## 2021-08-14 LAB — URINE CULTURE
MICRO NUMBER:: 13489234
Result:: NO GROWTH
SPECIMEN QUALITY:: ADEQUATE

## 2021-08-15 DIAGNOSIS — F4321 Adjustment disorder with depressed mood: Secondary | ICD-10-CM | POA: Diagnosis not present

## 2021-08-25 DIAGNOSIS — F4321 Adjustment disorder with depressed mood: Secondary | ICD-10-CM | POA: Diagnosis not present

## 2021-08-26 ENCOUNTER — Ambulatory Visit: Payer: BLUE CROSS/BLUE SHIELD | Admitting: Physician Assistant

## 2021-08-26 ENCOUNTER — Encounter: Payer: Self-pay | Admitting: Physician Assistant

## 2021-08-26 VITALS — BP 174/90 | HR 85 | Resp 15 | Ht 65.0 in | Wt 197.0 lb

## 2021-08-26 DIAGNOSIS — E210 Primary hyperparathyroidism: Secondary | ICD-10-CM

## 2021-08-26 DIAGNOSIS — M0609 Rheumatoid arthritis without rheumatoid factor, multiple sites: Secondary | ICD-10-CM

## 2021-08-26 DIAGNOSIS — Z8639 Personal history of other endocrine, nutritional and metabolic disease: Secondary | ICD-10-CM

## 2021-08-26 DIAGNOSIS — M19072 Primary osteoarthritis, left ankle and foot: Secondary | ICD-10-CM

## 2021-08-26 DIAGNOSIS — Z79899 Other long term (current) drug therapy: Secondary | ICD-10-CM

## 2021-08-26 DIAGNOSIS — E063 Autoimmune thyroiditis: Secondary | ICD-10-CM

## 2021-08-26 DIAGNOSIS — I10 Essential (primary) hypertension: Secondary | ICD-10-CM

## 2021-08-26 DIAGNOSIS — Z96611 Presence of right artificial shoulder joint: Secondary | ICD-10-CM | POA: Diagnosis not present

## 2021-08-26 DIAGNOSIS — M8589 Other specified disorders of bone density and structure, multiple sites: Secondary | ICD-10-CM

## 2021-08-26 DIAGNOSIS — M19041 Primary osteoarthritis, right hand: Secondary | ICD-10-CM

## 2021-08-26 DIAGNOSIS — Z87891 Personal history of nicotine dependence: Secondary | ICD-10-CM

## 2021-08-26 DIAGNOSIS — Z8719 Personal history of other diseases of the digestive system: Secondary | ICD-10-CM

## 2021-08-26 DIAGNOSIS — Z8759 Personal history of other complications of pregnancy, childbirth and the puerperium: Secondary | ICD-10-CM

## 2021-08-26 DIAGNOSIS — M19042 Primary osteoarthritis, left hand: Secondary | ICD-10-CM

## 2021-08-26 DIAGNOSIS — F32A Depression, unspecified: Secondary | ICD-10-CM

## 2021-08-26 DIAGNOSIS — F419 Anxiety disorder, unspecified: Secondary | ICD-10-CM

## 2021-08-26 DIAGNOSIS — Z8669 Personal history of other diseases of the nervous system and sense organs: Secondary | ICD-10-CM

## 2021-08-26 DIAGNOSIS — M19071 Primary osteoarthritis, right ankle and foot: Secondary | ICD-10-CM

## 2021-08-27 NOTE — Progress Notes (Signed)
Glucose is 107. Rest of CMP WNL.  CBC WNL.

## 2021-08-28 ENCOUNTER — Other Ambulatory Visit: Payer: Self-pay | Admitting: Physician Assistant

## 2021-08-28 DIAGNOSIS — Z130 Encounter for screening for diseases of the blood and blood-forming organs and certain disorders involving the immune mechanism: Secondary | ICD-10-CM | POA: Diagnosis not present

## 2021-08-28 DIAGNOSIS — Z6832 Body mass index (BMI) 32.0-32.9, adult: Secondary | ICD-10-CM | POA: Diagnosis not present

## 2021-08-28 DIAGNOSIS — Z1389 Encounter for screening for other disorder: Secondary | ICD-10-CM | POA: Diagnosis not present

## 2021-08-28 DIAGNOSIS — M0609 Rheumatoid arthritis without rheumatoid factor, multiple sites: Secondary | ICD-10-CM

## 2021-08-28 DIAGNOSIS — Z01419 Encounter for gynecological examination (general) (routine) without abnormal findings: Secondary | ICD-10-CM | POA: Diagnosis not present

## 2021-08-28 DIAGNOSIS — Z1231 Encounter for screening mammogram for malignant neoplasm of breast: Secondary | ICD-10-CM | POA: Diagnosis not present

## 2021-08-28 NOTE — Telephone Encounter (Signed)
Next Visit: 11/19/2021  Last Visit: 08/26/2021  Last Fill: 05/30/2021  [id number] arthritis of multiple sites with negative rheumatoid factor   Current Dose per office note 08/26/2021: Rinvoq 15 mg 1 tablet by mouth daily  Labs: 08/26/2021 Glucose is 107. Rest of CMP WNL.  CBC WNL.   TB Gold: 08/31/2020 Neg updated 08/26/2021 (results pending)   Okay to refill Rinvoq?

## 2021-08-30 LAB — CBC WITH DIFFERENTIAL/PLATELET
Absolute Monocytes: 546 cells/uL (ref 200–950)
Basophils Absolute: 42 cells/uL (ref 0–200)
Basophils Relative: 0.8 %
Eosinophils Absolute: 68 cells/uL (ref 15–500)
Eosinophils Relative: 1.3 %
HCT: 40.6 % (ref 35.0–45.0)
Hemoglobin: 13.3 g/dL (ref 11.7–15.5)
Lymphs Abs: 2116 cells/uL (ref 850–3900)
MCH: 30.1 pg (ref 27.0–33.0)
MCHC: 32.8 g/dL (ref 32.0–36.0)
MCV: 91.9 fL (ref 80.0–100.0)
MPV: 10.7 fL (ref 7.5–12.5)
Monocytes Relative: 10.5 %
Neutro Abs: 2428 cells/uL (ref 1500–7800)
Neutrophils Relative %: 46.7 %
Platelets: 295 10*3/uL (ref 140–400)
RBC: 4.42 10*6/uL (ref 3.80–5.10)
RDW: 12.5 % (ref 11.0–15.0)
Total Lymphocyte: 40.7 %
WBC: 5.2 10*3/uL (ref 3.8–10.8)

## 2021-08-30 LAB — COMPLETE METABOLIC PANEL WITH GFR
AG Ratio: 1.7 (calc) (ref 1.0–2.5)
ALT: 23 U/L (ref 6–29)
AST: 20 U/L (ref 10–35)
Albumin: 4.4 g/dL (ref 3.6–5.1)
Alkaline phosphatase (APISO): 78 U/L (ref 37–153)
BUN: 11 mg/dL (ref 7–25)
CO2: 28 mmol/L (ref 20–32)
Calcium: 10 mg/dL (ref 8.6–10.4)
Chloride: 103 mmol/L (ref 98–110)
Creat: 0.63 mg/dL (ref 0.50–1.03)
Globulin: 2.6 g/dL (calc) (ref 1.9–3.7)
Glucose, Bld: 107 mg/dL — ABNORMAL HIGH (ref 65–99)
Potassium: 5 mmol/L (ref 3.5–5.3)
Sodium: 140 mmol/L (ref 135–146)
Total Bilirubin: 0.3 mg/dL (ref 0.2–1.2)
Total Protein: 7 g/dL (ref 6.1–8.1)
eGFR: 102 mL/min/{1.73_m2} (ref >=60)

## 2021-08-30 LAB — QUANTIFERON-TB GOLD PLUS
Mitogen-NIL: >10 IU/mL
NIL: 0.06 IU/mL
QuantiFERON-TB Gold Plus: NEGATIVE
TB1-NIL: 0.02 IU/mL
TB2-NIL: 0.02 IU/mL

## 2021-09-01 DIAGNOSIS — F4321 Adjustment disorder with depressed mood: Secondary | ICD-10-CM | POA: Diagnosis not present

## 2021-09-02 ENCOUNTER — Ambulatory Visit: Payer: BLUE CROSS/BLUE SHIELD | Admitting: Family Medicine

## 2021-09-02 NOTE — Progress Notes (Signed)
Subjective:    Patient ID: Sandra [name], female    DOB: [birth year], 61 y.o.   MRN: [id number]  Chief Complaint  Patient presents with   Follow-up    HPI Patient is in today for a follow up on chronic medical concerns. No recent febrile illness or acute hospitalizations. She is still struggling with her grief after loosing her son but fortunately her other son in the [place] is being reassigned to [place] so he can help her more. Denies CP/palp/SOB/HA/congestion/fevers/GI or GU c/o. Taking meds as prescribed   Past Medical History:  Diagnosis Date   Allergic rhinitis 05/31/2012   Chest pain    PT SAW CARDIOLOGIST DR. Burt Knack - STRESS ECHO DONE NEGATIVE - NO PROBLEM SINCE   Chicken pox as achild   Cough 11/29/2012   RESOLVED - IT WAS CAUSED BY LISINOPRIL   Elevated LFTs 05/31/2012   Eustachian tube dysfunction 07/27/2017   GERD (gastroesophageal reflux disease)    H/O tobacco use, presenting hazards to health 11/29/2012   Smoked 1 1/2 ppd for 20 years quit in roughtly 2007   Headache 11/25/2015   Hematuria 04/09/2014   Hoarseness 02/04/2012   RESOLVED - THOUGHT TO HAVE BEEN ALLERY RELATED   Hyperglycemia 04/06/2014   Hyperlipidemia    Hyperparathyroidism ([place])    Hypertension    Obesity, unspecified 05/31/2012   Ocular migraine    Overactive bladder 05/31/2012   Preventative health care 02/04/2012   PTSD (post-traumatic stress disorder)    Rapid heart beat    PT FEELS PROB RELATED TO HYPER PARATHYROIDISM   Rheumatoid arthritis (HCC)    SUI (stress urinary incontinence, female) 02/04/2012   Sees Dr Perlie Gold and they have discussed a bladder tack but so far she declines    Past Surgical History:  Procedure Laterality Date   BREAST EXCISIONAL BIOPSY Left    Age 64 Fibroadenoma   BREAST SURGERY     left breast excision of adenoma   btl     COLONOSCOPY  07/2020   FOOT SURGERY     PARATHYROIDECTOMY Right 08/12/2013   Procedure: right inferior frozen section  PARATHYROIDECTOMY;  Surgeon: Earnstine Regal, MD;  Location: [facility];  Service: General;  Laterality: Right;   PELVIC LAPAROSCOPY  03/2004   TOTAL SHOULDER REPLACEMENT Right 01/2019   TUBAL LIGATION     WISDOM TOOTH EXTRACTION  75 yrs ago    Family History  Problem Relation Age of Onset   Hypertension Mother 25       alive   Renal cancer Mother        renal   Hyperlipidemia Mother    Osteoporosis Mother        humerus    Macular degeneration Mother    Tremor Mother    Parkinson's disease Mother    Hypertension Father 4       alive   Atrial fibrillation Father    Colon cancer Father        colon   Hyperlipidemia Father    Stroke Father    Alzheimer's disease Father    Hypertension Sister    Hypertension Brother 7       alive   Tremor Brother    Asthma Son    Eczema Son    Healthy Son    Heart disease Paternal Aunt    Heart disease Paternal Uncle    Lung cancer Maternal Grandfather        lung/ smoker   Emphysema Maternal Grandfather  Heart disease Paternal Grandmother    Other Paternal Grandfather        black lung   Heart disease Paternal Grandfather     Social History   Socioeconomic History   Marital status: Married    Spouse name: Not on file   Number of children: 2   Years of education: Not on file   Highest education level: Not on file  Occupational History   Occupation: Programmer, multimedia: [employer]  Tobacco Use   Smoking status: Former    Packs/day: 1.00    Years: 20.00    Total pack years: 20.00    Types: Cigarettes    Quit date: 03/10/2005    Years since quitting: 16.4   Smokeless tobacco: Never  Vaping Use   Vaping Use: Never used  Substance and Sexual Activity   Alcohol use: Not Currently   Drug use: No   Sexual activity: Yes    Partners: Male  Other Topics Concern   Not on file  Social History Narrative   Not on file   Social Determinants of Health   Financial Resource Strain: Not on file  Food Insecurity: Not on file   Transportation Needs: Not on file  Physical Activity: Not on file  Stress: Not on file  Social Connections: Not on file  Intimate Partner Violence: Not on file    Outpatient Medications Prior to Visit  Medication Sig Dispense Refill   aspirin 325 MG tablet Take 325 mg by mouth daily.     Cholecalciferol 100 MCG (4000 UT) CAPS Vitamin D3 100 mcg (4,000 unit) capsule  Take by oral route.     Co-Enzyme Q-10 30 MG CAPS Take 100 mg by mouth at bedtime.     DOCOSAHEXAENOIC ACID PO Take 1 tablet by mouth daily.     escitalopram (LEXAPRO) 10 MG tablet TAKE 1 TABLET BY MOUTH EVERY DAY 90 tablet 1   furosemide (LASIX) 20 MG tablet TAKE 1 TABLET BY MOUTH EVERY DAY 90 tablet 1   Ginger, Zingiber officinalis, (GINGER PO) Take by mouth.     irbesartan (AVAPRO) 150 MG tablet TAKE 1 TABLET BY MOUTH EVERY DAY 90 tablet 1   KRILL OIL PO krill oil     lactobacillus acidophilus (BACID) TABS tablet Take 2 tablets by mouth 3 (three) times daily.     leflunomide (ARAVA) 20 MG tablet Take 1 tablet (20 mg total) by mouth daily. 90 tablet 0   levothyroxine (SYNTHROID) 75 MCG tablet Take 1 tablet (75 mcg total) by mouth daily before breakfast. 30 tablet 0   MELATONIN PO Take 9 mg by mouth at bedtime.     metoprolol succinate (TOPROL-XL) 25 MG 24 hr tablet TAKE 1 TABLET (25 MG TOTAL) BY MOUTH DAILY. 90 tablet 1   Omega-3 Fatty Acids (OMEGA 3 PO) Take by mouth.     omeprazole (PRILOSEC) 40 MG capsule TAKE 1 CAPSULE BY MOUTH EVERY DAY 90 capsule 3   OVER THE COUNTER MEDICATION Take 1 capsule by mouth daily. Mega red- 1 capsule daily     RINVOQ 15 MG TB24 TAKE 1 TABLET BY MOUTH EVERY DAY 90 tablet 0   TART CHERRY PO Take by mouth.     Trospium Chloride 60 MG CP24 trospium ER 60 mg capsule,extended release 24 hr  TAKE 1 CAPSULE BY MOUTH EVERY DAY     TURMERIC PO Take 1 tablet by mouth 2 (two) times daily.     verapamil (CALAN-SR) 120 MG CR  tablet TAKE 1 TABLET BY MOUTH EVERYDAY AT BEDTIME 90 tablet 2   ALPRAZolam  (XANAX) 0.25 MG tablet as needed.     atorvastatin (LIPITOR) 40 MG tablet TAKE 1.5 TABLETS BY MOUTH DAILY. 135 tablet 1   clindamycin (CLEOCIN) 300 MG capsule clindamycin HCl 300 mg capsule  TAKE 2 CAPSULES BY MOUTH NOW, THEN TAKE 1 CAPSULE BY MOUTH 3 TIMES DAILY FOR DENTAL INFECTION     No facility-administered medications prior to visit.    Allergies  Allergen Reactions   Cefdinir Other (See Comments) and Nausea And Vomiting    Severe Stomach cramps and diarrhea for several days.   Doxycycline Diarrhea   Codeine Nausea And Vomiting   Penicillins     RASH, ITCHING   Simvastatin     myalgias   Other Rash    Review of Systems  Constitutional:  Positive for malaise/fatigue. Negative for fever.  HENT:  Negative for congestion.   Eyes:  Negative for blurred vision.  Respiratory:  Negative for shortness of breath.   Cardiovascular:  Negative for chest pain, palpitations and leg swelling.  Gastrointestinal:  Negative for abdominal pain, blood in stool and nausea.  Genitourinary:  Negative for dysuria and frequency.  Musculoskeletal:  Negative for falls.  Skin:  Negative for rash.  Neurological:  Negative for dizziness, loss of consciousness and headaches.  Endo/Heme/Allergies:  Negative for environmental allergies.  Psychiatric/Behavioral:  Positive for depression. The patient is nervous/anxious.        Objective:    Physical Exam Constitutional:      General: She is not in acute distress.    Appearance: She is well-developed.  HENT:     Head: Normocephalic and atraumatic.  Eyes:     Conjunctiva/sclera: Conjunctivae normal.  Neck:     Thyroid: No thyromegaly.  Cardiovascular:     Rate and Rhythm: Normal rate and regular rhythm.     Heart sounds: Normal heart sounds. No murmur heard. Pulmonary:     Effort: Pulmonary effort is normal. No respiratory distress.     Breath sounds: Normal breath sounds.  Abdominal:     General: Bowel sounds are normal. There is no  distension.     Palpations: Abdomen is soft. There is no mass.     Tenderness: There is no abdominal tenderness.  Musculoskeletal:     Cervical back: Neck supple.  Lymphadenopathy:     Cervical: No cervical adenopathy.  Skin:    General: Skin is warm and dry.  Neurological:     Mental Status: She is alert and oriented to person, place, and time.  Psychiatric:        Behavior: Behavior normal.     BP 126/89 (BP Location: Left Arm, Patient Position: Sitting, Cuff Size: Normal)   Pulse 89   Resp 20   Wt 198 lb (89.8 kg)   LMP 04/01/2011   SpO2 96%   BMI 32.95 kg/m  Wt Readings from Last 3 Encounters:  09/03/21 198 lb (89.8 kg)  08/26/21 197 lb (89.4 kg)  08/13/21 197 lb 3.2 oz (89.4 kg)    Diabetic Foot Exam - Simple   No data filed    Lab Results  Component Value Date   WBC 5.7 09/03/2021   HGB 13.4 09/03/2021   HCT 40.6 09/03/2021   PLT 285.0 09/03/2021   GLUCOSE 93 09/03/2021   CHOL 287 (H) 09/03/2021   TRIG 231.0 (H) 09/03/2021   HDL 69.80 09/03/2021   LDLDIRECT 179.0 09/03/2021   LDLCALC  163 (H) 11/08/2020   ALT 24 09/03/2021   AST 21 09/03/2021   NA 137 09/03/2021   K 4.8 09/03/2021   CL 100 09/03/2021   CREATININE 0.61 09/03/2021   BUN 19 09/03/2021   CO2 27 09/03/2021   TSH 0.93 09/03/2021   HGBA1C 5.9 09/03/2021    Lab Results  Component Value Date   TSH 0.93 09/03/2021   Lab Results  Component Value Date   WBC 5.7 09/03/2021   HGB 13.4 09/03/2021   HCT 40.6 09/03/2021   MCV 91.2 09/03/2021   PLT 285.0 09/03/2021   Lab Results  Component Value Date   NA 137 09/03/2021   K 4.8 09/03/2021   CO2 27 09/03/2021   GLUCOSE 93 09/03/2021   BUN 19 09/03/2021   CREATININE 0.61 09/03/2021   BILITOT 0.4 09/03/2021   ALKPHOS 74 09/03/2021   AST 21 09/03/2021   ALT 24 09/03/2021   PROT 7.4 09/03/2021   ALBUMIN 4.6 09/03/2021   CALCIUM 10.2 09/03/2021   CALCIUM 9.8 09/03/2021   ANIONGAP 10 11/01/2020   EGFR 102 08/26/2021   GFR 97.72  09/03/2021   Lab Results  Component Value Date   CHOL 287 (H) 09/03/2021   Lab Results  Component Value Date   HDL 69.80 09/03/2021   Lab Results  Component Value Date   LDLCALC 163 (H) 11/08/2020   Lab Results  Component Value Date   TRIG 231.0 (H) 09/03/2021   Lab Results  Component Value Date   CHOLHDL 4 09/03/2021   Lab Results  Component Value Date   HGBA1C 5.9 09/03/2021       Assessment & Plan:      Problem List Items Addressed This Visit     Hypertension - Primary    Well controlled, no changes to meds. Encouraged heart healthy diet such as the DASH diet and exercise as tolerated.       Relevant Medications   atorvastatin (LIPITOR) 80 MG tablet   Other Relevant Orders   CBC (Completed)   TSH (Completed)   Hyperlipidemia    Tolerating statin, encouraged heart healthy diet, avoid trans fats, minimize simple carbs and saturated fats. Increase exercise as tolerated      Relevant Medications   atorvastatin (LIPITOR) 80 MG tablet   Other Relevant Orders   Lipid panel (Completed)   Lipid panel   Obesity (BMI 30-39.9)    Encouraged DASH or MIND diet, decrease po intake and increase exercise as tolerated. Needs 7-8 hours of sleep nightly. Avoid trans fats, eat small, frequent meals every 4-5 hours with lean proteins, complex carbs and healthy fats. Minimize simple carbs, high fat foods and processed foods      Hypercalcemia   Relevant Orders   VITAMIN D 25 Hydroxy (Vit-D Deficiency, Fractures) (Completed)   PTH, Intact and Calcium (Completed)   Hyperparathyroidism, primary (HCC)   Hyperglycemia    hgba1c acceptable, minimize simple carbs. Increase exercise as tolerated.       Relevant Orders   Comprehensive metabolic panel (Completed)   Hemoglobin A1c (Completed)   Anxiety and depression    Still struggling with grief after the loss of her son but fortunately her other son who is in the [place] is being redirected to [facility] so he can be nearby to  help her. Tolerating Escitalpram      Relevant Medications   ALPRAZolam (XANAX) 0.25 MG tablet   Rheumatoid arthritis [facility])   Pulmonary nodule    Repeat CT scan in August  for stability given patient's history of smoking.       Relevant Orders   CT Chest Wo Contrast   Insomnia    Encouraged good sleep hygiene such as dark, quiet room. No blue/green glowing lights such as computer screens in bedroom. No alcohol or stimulants in evening. Cut down on caffeine as able. Regular exercise is helpful but not just prior to bed time. Good response to Melatonin 9 mg qhs      Osteopenia    Bone density shows osteopenia, which is thinner than normal but not as bad as osteoporosis. Recommend calcium intake of 1200 to 1500 mg daily, divided into roughly 3 doses. Best source is the diet and a single dairy serving is about 500 mg, a supplement of calcium citrate once or twice daily to balance diet is fine if not getting enough in diet. Also need Vitamin D 2000 IU caps, 1 cap daily if not already taking vitamin D. Also recommend weight baring exercise on hips and upper body to keep bones strong. Repeat Dexa scan ordered      Other Visit Diagnoses     Abnormal mammogram       Relevant Orders   MM DIAG BREAST TOMO BILATERAL   Post-menopausal       Estrogen deficiency           I have discontinued Almyra Free A. Widmer's clindamycin. I have also changed her ALPRAZolam and atorvastatin. Additionally, I am having her maintain her OVER THE COUNTER MEDICATION, lactobacillus acidophilus, Co-Enzyme Q-10, DOCOSAHEXAENOIC ACID PO, aspirin, levothyroxine, TURMERIC PO, Trospium Chloride, Omega-3 Fatty Acids (OMEGA 3 PO), (Ginger, Zingiber officinalis, (GINGER PO)), TART CHERRY PO, omeprazole, KRILL OIL PO, Cholecalciferol, metoprolol succinate, escitalopram, irbesartan, furosemide, leflunomide, verapamil, Rinvoq, and MELATONIN PO.  Meds ordered this encounter  Medications   ALPRAZolam (XANAX) 0.25 MG tablet     Sig: Take 1 tablet (0.25 mg total) by mouth daily as needed.    Dispense:  15 tablet    Refill:  0   atorvastatin (LIPITOR) 80 MG tablet    Sig: TAKE 1 TABLET BY MOUTH DAILY.    Dispense:  90 tablet    Refill:  1

## 2021-09-03 ENCOUNTER — Ambulatory Visit (INDEPENDENT_AMBULATORY_CARE_PROVIDER_SITE_OTHER): Payer: BLUE CROSS/BLUE SHIELD | Admitting: Family Medicine

## 2021-09-03 ENCOUNTER — Encounter: Payer: Self-pay | Admitting: Family Medicine

## 2021-09-03 VITALS — BP 126/89 | HR 89 | Resp 20 | Wt 198.0 lb

## 2021-09-03 DIAGNOSIS — M8580 Other specified disorders of bone density and structure, unspecified site: Secondary | ICD-10-CM

## 2021-09-03 DIAGNOSIS — E2839 Other primary ovarian failure: Secondary | ICD-10-CM

## 2021-09-03 DIAGNOSIS — E782 Mixed hyperlipidemia: Secondary | ICD-10-CM

## 2021-09-03 DIAGNOSIS — I10 Essential (primary) hypertension: Secondary | ICD-10-CM | POA: Diagnosis not present

## 2021-09-03 DIAGNOSIS — R739 Hyperglycemia, unspecified: Secondary | ICD-10-CM

## 2021-09-03 DIAGNOSIS — F419 Anxiety disorder, unspecified: Secondary | ICD-10-CM

## 2021-09-03 DIAGNOSIS — Z780 Asymptomatic menopausal state: Secondary | ICD-10-CM

## 2021-09-03 DIAGNOSIS — E210 Primary hyperparathyroidism: Secondary | ICD-10-CM | POA: Diagnosis not present

## 2021-09-03 DIAGNOSIS — R911 Solitary pulmonary nodule: Secondary | ICD-10-CM

## 2021-09-03 DIAGNOSIS — R928 Other abnormal and inconclusive findings on diagnostic imaging of breast: Secondary | ICD-10-CM

## 2021-09-03 DIAGNOSIS — M06031 Rheumatoid arthritis without rheumatoid factor, right wrist: Secondary | ICD-10-CM

## 2021-09-03 DIAGNOSIS — E669 Obesity, unspecified: Secondary | ICD-10-CM

## 2021-09-03 DIAGNOSIS — M06032 Rheumatoid arthritis without rheumatoid factor, left wrist: Secondary | ICD-10-CM

## 2021-09-03 DIAGNOSIS — F32A Depression, unspecified: Secondary | ICD-10-CM

## 2021-09-03 DIAGNOSIS — G4700 Insomnia, unspecified: Secondary | ICD-10-CM

## 2021-09-03 LAB — LIPID PANEL
Cholesterol: 287 mg/dL — ABNORMAL HIGH (ref 0–200)
HDL: 69.8 mg/dL (ref >39.00)
NonHDL: 217.66
Total CHOL/HDL Ratio: 4
Triglycerides: 231 mg/dL — ABNORMAL HIGH (ref 0.0–149.0)
VLDL: 46.2 mg/dL — ABNORMAL HIGH (ref 0.0–40.0)

## 2021-09-03 LAB — COMPREHENSIVE METABOLIC PANEL
ALT: 24 U/L (ref 0–35)
AST: 21 U/L (ref 0–37)
Albumin: 4.6 g/dL (ref 3.5–5.2)
Alkaline Phosphatase: 74 U/L (ref 39–117)
BUN: 19 mg/dL (ref 6–23)
CO2: 27 mEq/L (ref 19–32)
Calcium: 10.2 mg/dL (ref 8.4–10.5)
Chloride: 100 mEq/L (ref 96–112)
Creatinine, Ser: 0.61 mg/dL (ref 0.40–1.20)
GFR: 97.72 mL/min (ref >60.00)
Glucose, Bld: 93 mg/dL (ref 70–99)
Potassium: 4.8 mEq/L (ref 3.5–5.1)
Sodium: 137 mEq/L (ref 135–145)
Total Bilirubin: 0.4 mg/dL (ref 0.2–1.2)
Total Protein: 7.4 g/dL (ref 6.0–8.3)

## 2021-09-03 LAB — TSH: TSH: 0.93 u[IU]/mL (ref 0.35–5.50)

## 2021-09-03 LAB — CBC
HCT: 40.6 % (ref 36.0–46.0)
Hemoglobin: 13.4 g/dL (ref 12.0–15.0)
MCHC: 33 g/dL (ref 30.0–36.0)
MCV: 91.2 fl (ref 78.0–100.0)
Platelets: 285 10*3/uL (ref 150.0–400.0)
RBC: 4.46 Mil/uL (ref 3.87–5.11)
RDW: 13.9 % (ref 11.5–15.5)
WBC: 5.7 10*3/uL (ref 4.0–10.5)

## 2021-09-03 LAB — HEMOGLOBIN A1C: Hgb A1c MFr Bld: 5.9 % (ref 4.6–6.5)

## 2021-09-03 LAB — VITAMIN D 25 HYDROXY (VIT D DEFICIENCY, FRACTURES): VITD: 57.88 ng/mL (ref 30.00–100.00)

## 2021-09-03 LAB — LDL CHOLESTEROL, DIRECT: Direct LDL: 179 mg/dL

## 2021-09-03 MED ORDER — ALPRAZOLAM 0.25 MG PO TABS: 0.2500 mg | ORAL_TABLET | Freq: Every day | ORAL | 0 refills | Status: DC | PRN

## 2021-09-04 ENCOUNTER — Encounter: Payer: Self-pay | Admitting: Family Medicine

## 2021-09-04 DIAGNOSIS — M8580 Other specified disorders of bone density and structure, unspecified site: Secondary | ICD-10-CM | POA: Insufficient documentation

## 2021-09-04 DIAGNOSIS — G4700 Insomnia, unspecified: Secondary | ICD-10-CM | POA: Insufficient documentation

## 2021-09-04 LAB — PTH, INTACT AND CALCIUM
Calcium: 9.8 mg/dL (ref 8.6–10.4)
PTH: 68 pg/mL (ref 16–77)

## 2021-09-04 MED ORDER — ATORVASTATIN CALCIUM 80 MG PO TABS
ORAL_TABLET | ORAL | 1 refills | Status: DC
Start: 2021-09-04 — End: 2022-06-12

## 2021-09-04 NOTE — Assessment & Plan Note (Signed)
Bone density shows osteopenia, which is thinner than normal but not as bad as osteoporosis. Recommend calcium intake of 1200 to 1500 mg daily, divided into roughly 3 doses. Best source is the diet and a single dairy serving is about 500 mg, a supplement of calcium citrate once or twice daily to balance diet is fine if not getting enough in diet. Also need Vitamin D 2000 IU caps, 1 cap daily if not already taking vitamin D. Also recommend weight baring exercise on hips and upper body to keep bones strong. Repeat Dexa scan ordered

## 2021-09-04 NOTE — Assessment & Plan Note (Signed)
Encouraged DASH or MIND diet, decrease po intake and increase exercise as tolerated. Needs 7-8 hours of sleep nightly. Avoid trans fats, eat small, frequent meals every 4-5 hours with lean proteins, complex carbs and healthy fats. Minimize simple carbs, high fat foods and processed foods 

## 2021-09-04 NOTE — Assessment & Plan Note (Signed)
Encouraged good sleep hygiene such as dark, quiet room. No blue/green glowing lights such as computer screens in bedroom. No alcohol or stimulants in evening. Cut down on caffeine as able. Regular exercise is helpful but not just prior to bed time. Good response to Melatonin 9 mg qhs

## 2021-09-04 NOTE — Assessment & Plan Note (Signed)
hgba1c acceptable, minimize simple carbs. Increase exercise as tolerated.  

## 2021-09-04 NOTE — Assessment & Plan Note (Signed)
Still struggling with grief after the loss of her son but fortunately her other son who is in the [place] is being redirected to [place] so he can be nearby to help her. Tolerating Escitalpram

## 2021-09-04 NOTE — Assessment & Plan Note (Signed)
Repeat CT scan in August for stability given patient's history of smoking.

## 2021-09-04 NOTE — Assessment & Plan Note (Signed)
Tolerating statin, encouraged heart healthy diet, avoid trans fats, minimize simple carbs and saturated fats. Increase exercise as tolerated 

## 2021-09-04 NOTE — Assessment & Plan Note (Signed)
Well controlled, no changes to meds. Encouraged heart healthy diet such as the DASH diet and exercise as tolerated.  °

## 2021-09-09 ENCOUNTER — Encounter: Payer: Self-pay | Admitting: Family Medicine

## 2021-09-09 ENCOUNTER — Other Ambulatory Visit: Payer: Self-pay | Admitting: Obstetrics and Gynecology

## 2021-09-09 ENCOUNTER — Ambulatory Visit (HOSPITAL_BASED_OUTPATIENT_CLINIC_OR_DEPARTMENT_OTHER)
Admission: RE | Admit: 2021-09-09 | Discharge: 2021-09-09 | Disposition: A | Discharge status: Home / Self Care | Payer: BLUE CROSS/BLUE SHIELD | Source: Ambulatory Visit | Attending: Physician Assistant | Admitting: Physician Assistant

## 2021-09-09 DIAGNOSIS — M8589 Other specified disorders of bone density and structure, multiple sites: Secondary | ICD-10-CM | POA: Insufficient documentation

## 2021-09-09 DIAGNOSIS — R928 Other abnormal and inconclusive findings on diagnostic imaging of breast: Secondary | ICD-10-CM

## 2021-09-10 NOTE — Progress Notes (Signed)
Patient's DEXA results are consistent with osteopenia.  Please emphasize the use of calcium, vitamin D, and resistive exercises.  Recheck DEXA in 2 years.

## 2021-09-12 DIAGNOSIS — F4321 Adjustment disorder with depressed mood: Secondary | ICD-10-CM | POA: Diagnosis not present

## 2021-09-14 ENCOUNTER — Other Ambulatory Visit: Payer: Self-pay | Admitting: Physician Assistant

## 2021-09-14 ENCOUNTER — Other Ambulatory Visit: Payer: Self-pay | Admitting: Family Medicine

## 2021-09-14 DIAGNOSIS — F419 Anxiety disorder, unspecified: Secondary | ICD-10-CM

## 2021-09-16 NOTE — Telephone Encounter (Signed)
Next Visit: 11/19/2021   Last Visit: 08/26/2021   Last Fill: 05/28/2021  BH:[id number] arthritis of multiple sites with negative rheumatoid factor    Current Dose per office note 08/26/2021:  Arava 20 mg 1 tablet by mouth daily   Labs: 08/26/2021 Glucose is 107. Rest of CMP WNL.  CBC WNL.    Okay to refill Arava?

## 2021-09-19 DIAGNOSIS — F4321 Adjustment disorder with depressed mood: Secondary | ICD-10-CM | POA: Diagnosis not present

## 2021-09-24 ENCOUNTER — Ambulatory Visit
Admission: RE | Admit: 2021-09-24 | Discharge: 2021-09-24 | Disposition: A | Discharge status: Home / Self Care | Payer: BLUE CROSS/BLUE SHIELD | Source: Ambulatory Visit | Attending: Obstetrics and Gynecology | Admitting: Obstetrics and Gynecology

## 2021-09-24 ENCOUNTER — Other Ambulatory Visit: Payer: Self-pay | Admitting: Obstetrics and Gynecology

## 2021-09-24 ENCOUNTER — Ambulatory Visit (HOSPITAL_BASED_OUTPATIENT_CLINIC_OR_DEPARTMENT_OTHER)
Admission: RE | Admit: 2021-09-24 | Discharge: 2021-09-24 | Disposition: A | Discharge status: Home / Self Care | Payer: BLUE CROSS/BLUE SHIELD | Source: Ambulatory Visit | Attending: Family Medicine | Admitting: Family Medicine

## 2021-09-24 DIAGNOSIS — R922 Inconclusive mammogram: Secondary | ICD-10-CM | POA: Diagnosis not present

## 2021-09-24 DIAGNOSIS — R911 Solitary pulmonary nodule: Secondary | ICD-10-CM | POA: Diagnosis not present

## 2021-09-24 DIAGNOSIS — R928 Other abnormal and inconclusive findings on diagnostic imaging of breast: Secondary | ICD-10-CM

## 2021-09-26 ENCOUNTER — Telehealth: Payer: Self-pay | Admitting: Family Medicine

## 2021-09-26 MED ORDER — LEVOTHYROXINE SODIUM 75 MCG PO TABS: 75.0000 ug | ORAL_TABLET | Freq: Every day | ORAL | 1 refills | Status: DC

## 2021-09-26 MED ORDER — OMEPRAZOLE 40 MG PO CPDR: DELAYED_RELEASE_CAPSULE | ORAL | 1 refills | Status: DC

## 2021-09-26 NOTE — Telephone Encounter (Signed)
Refills sent

## 2021-09-26 NOTE — Telephone Encounter (Signed)
Medication: levothyroxine and omeprazole  Has the patient contacted their pharmacy? No. Preferred Pharmacy (with phone number or street name):   [id number] [place], [place] - [id number] Phone:  [phone] Fax:  [phone]  Agent: Please be advised that RX refills may take up to 3 business days. We ask that you follow-up with your pharmacy.

## 2021-09-29 DIAGNOSIS — F4321 Adjustment disorder with depressed mood: Secondary | ICD-10-CM | POA: Diagnosis not present

## 2021-10-02 ENCOUNTER — Ambulatory Visit
Admission: RE | Admit: 2021-10-02 | Discharge: 2021-10-02 | Disposition: A | Discharge status: Home / Self Care | Payer: BLUE CROSS/BLUE SHIELD | Source: Ambulatory Visit | Attending: Obstetrics and Gynecology | Admitting: Obstetrics and Gynecology

## 2021-10-02 DIAGNOSIS — R928 Other abnormal and inconclusive findings on diagnostic imaging of breast: Secondary | ICD-10-CM

## 2021-10-02 DIAGNOSIS — D0511 Intraductal carcinoma in situ of right breast: Secondary | ICD-10-CM | POA: Diagnosis not present

## 2021-10-02 DIAGNOSIS — N6011 Diffuse cystic mastopathy of right breast: Secondary | ICD-10-CM | POA: Diagnosis not present

## 2021-10-02 DIAGNOSIS — R921 Mammographic calcification found on diagnostic imaging of breast: Secondary | ICD-10-CM | POA: Diagnosis not present

## 2021-10-03 LAB — HM MAMMOGRAPHY

## 2021-10-06 DIAGNOSIS — F4321 Adjustment disorder with depressed mood: Secondary | ICD-10-CM | POA: Diagnosis not present

## 2021-10-07 ENCOUNTER — Ambulatory Visit (INDEPENDENT_AMBULATORY_CARE_PROVIDER_SITE_OTHER): Payer: BLUE CROSS/BLUE SHIELD | Admitting: Podiatry

## 2021-10-07 DIAGNOSIS — C50911 Malignant neoplasm of unspecified site of right female breast: Secondary | ICD-10-CM | POA: Diagnosis not present

## 2021-10-07 DIAGNOSIS — Z170 Estrogen receptor positive status [ER+]: Secondary | ICD-10-CM | POA: Diagnosis not present

## 2021-10-07 DIAGNOSIS — M778 Other enthesopathies, not elsewhere classified: Secondary | ICD-10-CM | POA: Diagnosis not present

## 2021-10-07 MED ORDER — BETAMETHASONE SOD PHOS & ACET 6 (3-3) MG/ML IJ SUSP
3.0000 mg | Freq: Once | INTRAMUSCULAR | Status: AC
  Administered 2021-10-07: 3 mg via INTRA_ARTICULAR

## 2021-10-07 NOTE — Progress Notes (Signed)
Chief Complaint  Patient presents with   Foot Pain    Patient is here for injections in both feet for pain.    Subjective:  61 year old female PMHx rheumatoid arthritis presenting today for follow-up management of chronic bilateral midtarsal pain.  Patient states that the injections helped for about 3 months.  She is being seen by rheumatology.  She also states that she does have some inflammatory redness to the right fifth digit.  Denies a history of injury.  Has not done anything for treatment.  Past Medical History:  Diagnosis Date   Allergic rhinitis 05/31/2012   Chest pain    PT SAW CARDIOLOGIST DR. Burt Knack - STRESS ECHO DONE NEGATIVE - NO PROBLEM SINCE   Chicken pox as achild   Cough 11/29/2012   RESOLVED - IT WAS CAUSED BY LISINOPRIL   Elevated LFTs 05/31/2012   Eustachian tube dysfunction 07/27/2017   GERD (gastroesophageal reflux disease)    H/O tobacco use, presenting hazards to health 11/29/2012   Smoked 1 1/2 ppd for 20 years quit in roughtly 2007   Headache 11/25/2015   Hematuria 04/09/2014   Hoarseness 02/04/2012   RESOLVED - THOUGHT TO HAVE BEEN ALLERY RELATED   Hyperglycemia 04/06/2014   Hyperlipidemia    Hyperparathyroidism ([place])    Hypertension    Obesity, unspecified 05/31/2012   Ocular migraine    Overactive bladder 05/31/2012   Preventative health care 02/04/2012   PTSD (post-traumatic stress disorder)    Rapid heart beat    PT FEELS PROB RELATED TO HYPER PARATHYROIDISM   Rheumatoid arthritis (HCC)    SUI (stress urinary incontinence, female) 02/04/2012   Sees Dr Perlie Gold and they have discussed a bladder tack but so far she declines   Past Surgical History:  Procedure Laterality Date   BREAST EXCISIONAL BIOPSY Left    Age 40 Fibroadenoma   BREAST SURGERY     left breast excision of adenoma   btl     COLONOSCOPY  07/2020   FOOT SURGERY     PARATHYROIDECTOMY Right 08/12/2013   Procedure: right inferior frozen section PARATHYROIDECTOMY;  Surgeon: Earnstine Regal, MD;  Location: [facility];  Service: General;  Laterality: Right;   PELVIC LAPAROSCOPY  03/2004   TOTAL SHOULDER REPLACEMENT Right 01/2019   TUBAL LIGATION     WISDOM TOOTH EXTRACTION  30 yrs ago   Allergies  Allergen Reactions   Cefdinir Other (See Comments) and Nausea And Vomiting    Severe Stomach cramps and diarrhea for several days.   Doxycycline Diarrhea   Codeine Nausea And Vomiting   Penicillins     RASH, ITCHING   Simvastatin     myalgias   Other Rash   Objective: Physical Exam General: The patient is alert and oriented x3 in no acute distress.  Dermatology: Skin is cool, dry and supple bilateral lower extremities. Negative for open lesions or macerations.  Vascular: Palpable pedal pulses bilaterally.  There is some redness with edema noted to the right fifth digit.  No open wounds in this area.  No heat.  Neurological: Epicritic and protective threshold grossly intact bilaterally.   Musculoskeletal Exam: Chronic pain on palpation throughout the midtarsal joint bilateral  Radiographic exam B/L feet 04/03/2021 Normal osseous mineralization.  There is some degenerative changes noted throughout the lesser TMT joints consistent with the patient's given history of rheumatoid and osteoarthritis.  No acute fractures identified.  Assessment: 1. s/p excision of benign skin lesion left plantar foot. DOS: 06/13/2021 2. Chronic  bilateral midfoot capsulitis 3.  Capsulitis right fifth digit   Plan of Care:  1. Patient was evaluated.  Patient declined x-rays of the right fifth digit. 2.  Injection of 0.5 cc Celestone Soluspan injection of the bilateral midtarsal joint 3.  Continue close management with rheumatology 4.  Continue custom molded orthotics 5.  Return to clinic as needed, or about 3 months for follow-up care  Edrick Kins, DPM [facility]  Dr. Edrick Kins, DPM    [address], [place] [address]                 Office [phone]  Fax [phone]

## 2021-10-08 DIAGNOSIS — D2362 Other benign neoplasm of skin of left upper limb, including shoulder: Secondary | ICD-10-CM | POA: Diagnosis not present

## 2021-10-08 DIAGNOSIS — L814 Other melanin hyperpigmentation: Secondary | ICD-10-CM | POA: Diagnosis not present

## 2021-10-08 DIAGNOSIS — D2372 Other benign neoplasm of skin of left lower limb, including hip: Secondary | ICD-10-CM | POA: Diagnosis not present

## 2021-10-08 DIAGNOSIS — X32XXXA Exposure to sunlight, initial encounter: Secondary | ICD-10-CM | POA: Diagnosis not present

## 2021-10-08 DIAGNOSIS — L821 Other seborrheic keratosis: Secondary | ICD-10-CM | POA: Diagnosis not present

## 2021-10-08 DIAGNOSIS — L570 Actinic keratosis: Secondary | ICD-10-CM | POA: Diagnosis not present

## 2021-10-14 DIAGNOSIS — C50911 Malignant neoplasm of unspecified site of right female breast: Secondary | ICD-10-CM | POA: Insufficient documentation

## 2021-10-24 DIAGNOSIS — Z803 Family history of malignant neoplasm of breast: Secondary | ICD-10-CM | POA: Diagnosis not present

## 2021-10-24 DIAGNOSIS — Z809 Family history of malignant neoplasm, unspecified: Secondary | ICD-10-CM | POA: Diagnosis not present

## 2021-10-24 DIAGNOSIS — Z800 Family history of malignant neoplasm of digestive organs: Secondary | ICD-10-CM | POA: Diagnosis not present

## 2021-10-24 DIAGNOSIS — C50911 Malignant neoplasm of unspecified site of right female breast: Secondary | ICD-10-CM | POA: Diagnosis not present

## 2021-10-28 DIAGNOSIS — E6609 Other obesity due to excess calories: Secondary | ICD-10-CM | POA: Diagnosis not present

## 2021-10-28 DIAGNOSIS — M0520 Rheumatoid vasculitis with rheumatoid arthritis of unspecified site: Secondary | ICD-10-CM | POA: Diagnosis not present

## 2021-10-28 DIAGNOSIS — Z170 Estrogen receptor positive status [ER+]: Secondary | ICD-10-CM | POA: Diagnosis not present

## 2021-10-28 DIAGNOSIS — C50911 Malignant neoplasm of unspecified site of right female breast: Secondary | ICD-10-CM | POA: Diagnosis not present

## 2021-10-29 ENCOUNTER — Telehealth: Payer: Self-pay | Admitting: *Deleted

## 2021-10-29 NOTE — Telephone Encounter (Signed)
Patient contacted the office to advise she has been diagnosed with breast cancer. She states she is undergoing a bilateral mastectomy at [facility] on 11/21/2021. Dr. Al Corpus is her surgeon. She does plan to have reconstructive surgery with Dr. Tressa Busman after healing from mastectomy. Patient is currently on Rinvoq and [place]. Patient states at this time there is no plan for chemotherapy or radiation at this time. Patient has a follow up appointment with [place] on 11/19/2021.

## 2021-10-29 NOTE — Telephone Encounter (Signed)
She will have to discontinue Rinvoq and Arava 1 week prior to the surgery and may resume both medications 2 weeks after the surgery if she has no infection and she gets clearance from the surgeon.

## 2021-10-29 NOTE — Telephone Encounter (Signed)
During previous phone call patient stated she was aware she would need to be off of her [place] and [place]. Patient stated she was advised by her surgeon to stop her medications about 2 weeks prior to surgery. Patient was instructed to get clearance from the surgeon to restart her medications and she verbalized understanding.

## 2021-10-30 DIAGNOSIS — R519 Headache, unspecified: Secondary | ICD-10-CM | POA: Diagnosis not present

## 2021-11-01 ENCOUNTER — Telehealth: Payer: Self-pay

## 2021-11-01 NOTE — Telephone Encounter (Signed)
Called pt Lvm to call our office back if she need anything from [place]

## 2021-11-01 NOTE — Telephone Encounter (Signed)
Pt called to let PCP know she has been diagnosed w/ breast cancer, she is scheduled for bilateral mastectomies with Dr. Al Corpus at [facility] on 9/21.

## 2021-11-01 NOTE — Telephone Encounter (Signed)
Pt was called back to go over available info. Pt was offered VV per Dr. Charlett Blake in case there was anything they wanted to go over. Pt declined stating that she feels she is ok for now but was thankful that Dr. Charlett Blake was willing to do that for her. She stated that if she needs anything she will be sure to reach out to our office.

## 2021-11-04 ENCOUNTER — Other Ambulatory Visit (HOSPITAL_BASED_OUTPATIENT_CLINIC_OR_DEPARTMENT_OTHER): Payer: BLUE CROSS/BLUE SHIELD

## 2021-11-05 NOTE — Progress Notes (Unsigned)
Office Visit Note  Patient: Sandra [name]             Date of Birth: 30-Sep-1960           MRN: [id number]             PCP: [name], MD Referring: [name], MD Visit Date: 11/19/2021 Occupation: _0 @  Subjective:  Discuss holding medications   History of Present Illness: Sandra [name] is a 61 y.o. female with history of seronegative rheumatoid arthritis and osteoarthritis.  Patient is currently holding Arava 20 mg 1 tablet daily and Rinvoq 15 mg 1 tablet by mouth daily due to recent diagnosed of breast cancer in august 2023.  She is planning on proceeding with a bilateral mastectomy on 11/28/21 performed by Dr. Morley Kos.  Her last dose of endocrine Areva was 6 days ago.  She plans on undergoing reconstructive surgery after the bilateral mastectomy.  She is unsure if she will require chemotherapy or radiation until the lymph node biopsy.  She states she has been taking Advil sparingly for pain relief.  She continues to have pain and stiffness in both hands and both feet but denies any obvious joint swelling at this time.  She states she has been sleeping better at night.  She continues to experience fatigue especially later in the afternoons.  She denies any other new medical conditions.  Denies any recent infections.     Activities of Daily Living:  Patient reports morning stiffness for several hours.   Patient Reports nocturnal pain.  Difficulty dressing/grooming: Denies Difficulty climbing stairs: Denies Difficulty getting out of chair: Denies Difficulty using hands for taps, buttons, cutlery, and/or writing: Reports  Review of Systems  Constitutional:  Positive for fatigue.  HENT:  Positive for mouth dryness. Negative for mouth sores.   Eyes:  Positive for dryness.  Respiratory:  Positive for shortness of breath.   Cardiovascular:  Negative for chest pain and palpitations.  Gastrointestinal:  Negative for blood in stool, constipation and diarrhea.   Endocrine: Negative for increased urination.  Genitourinary:  Negative for involuntary urination.  Musculoskeletal:  Positive for joint pain, joint pain, joint swelling and morning stiffness. Negative for gait problem, myalgias, muscle weakness, muscle tenderness and myalgias.  Skin:  Negative for color change, rash, hair loss and sensitivity to sunlight.  Allergic/Immunologic: Negative for susceptible to infections.  Neurological:  Negative for dizziness and headaches.  Hematological:  Negative for swollen glands.  Psychiatric/Behavioral:  Negative for depressed mood and sleep disturbance. The patient is not nervous/anxious.     PMFS History:  Patient Active Problem List   Diagnosis Date Noted   Insomnia 09/04/2021   Osteopenia 09/04/2021   Pulmonary nodule 03/29/2021   Rheumatoid arthritis ([place]) 12/02/2019   Anxiety and depression 08/01/2019   Arthralgia 05/04/2019   OSA (obstructive sleep apnea) 05/03/2019   Thyroiditis 12/20/2018   Muscle cramp 12/20/2018   Eustachian tube dysfunction 07/27/2017   Headache 11/25/2015   Acute bacterial sinusitis 03/26/2015   Hyperglycemia 04/06/2014   Hyperparathyroidism, primary ([place]) 07/12/2013   Hypercalcemia 12/16/2012   H/O tobacco use, presenting hazards to health 11/29/2012   Overactive bladder 05/31/2012   Elevated LFTs 05/31/2012   Obesity (BMI 30-39.9) 05/31/2012   Allergic rhinitis 05/31/2012   Colon cancer screening 02/04/2012   SUI (stress urinary incontinence, female) 02/04/2012   GERD (gastroesophageal reflux disease)    Rotator cuff syndrome of right shoulder 06/11/2011   Hypertension 01/03/2011   Hyperlipidemia 01/03/2011  KNEE PAIN, BILATERAL 09/28/2008   ANSERINE BURSITIS 09/28/2008   CAVUS DEFORMITY OF FOOT, ACQUIRED 09/28/2008    Past Medical History:  Diagnosis Date   Allergic rhinitis 05/31/2012   Breast cancer ([place])    right breast, dx 10/2021.   Chest pain    PT SAW CARDIOLOGIST DR. Burt Knack - STRESS ECHO  DONE NEGATIVE - NO PROBLEM SINCE   Chicken pox as achild   Cough 11/29/2012   RESOLVED - IT WAS CAUSED BY LISINOPRIL   Elevated LFTs 05/31/2012   Eustachian tube dysfunction 07/27/2017   GERD (gastroesophageal reflux disease)    H/O tobacco use, presenting hazards to health 11/29/2012   Smoked 1 1/2 ppd for 20 years quit in roughtly 2007   Headache 11/25/2015   Hematuria 04/09/2014   Hoarseness 02/04/2012   RESOLVED - THOUGHT TO HAVE BEEN ALLERY RELATED   Hyperglycemia 04/06/2014   Hyperlipidemia    Hyperparathyroidism ([place])    Hypertension    Obesity, unspecified 05/31/2012   Ocular migraine    Overactive bladder 05/31/2012   Preventative health care 02/04/2012   PTSD (post-traumatic stress disorder)    Rapid heart beat    PT FEELS PROB RELATED TO HYPER PARATHYROIDISM   Rheumatoid arthritis ([place])    SUI (stress urinary incontinence, female) 02/04/2012   Sees Dr Perlie Gold and they have discussed a bladder tack but so far she declines    Family History  Problem Relation Age of Onset   Hypertension Mother 70       alive   Renal cancer Mother        renal   Hyperlipidemia Mother    Osteoporosis Mother        humerus    Macular degeneration Mother    Tremor Mother    Parkinson's disease Mother    Hypertension Father 28       alive   Atrial fibrillation Father    Colon cancer Father        colon   Hyperlipidemia Father    Stroke Father    Alzheimer's disease Father    Hypertension Sister    Hypertension Brother 66       alive   Tremor Brother    Asthma Son    Eczema Son    Healthy Son    Heart disease Paternal Aunt    Heart disease Paternal Uncle    Lung cancer Maternal Grandfather        lung/ smoker   Emphysema Maternal Grandfather    Heart disease Paternal Grandmother    Other Paternal Grandfather        black lung   Heart disease Paternal Grandfather    Past Surgical History:  Procedure Laterality Date   BREAST EXCISIONAL BIOPSY Left    Age 89  Fibroadenoma   BREAST SURGERY     left breast excision of adenoma   btl     COLONOSCOPY  07/2020   FOOT SURGERY     PARATHYROIDECTOMY Right 08/12/2013   Procedure: right inferior frozen section PARATHYROIDECTOMY;  Surgeon: Earnstine Regal, MD;  Location: [facility];  Service: General;  Laterality: Right;   PELVIC LAPAROSCOPY  03/2004   TOTAL SHOULDER REPLACEMENT Right 01/2019   TUBAL LIGATION     WISDOM TOOTH EXTRACTION  30 yrs ago   Social History   Social History Narrative   Not on file   Immunization History  Administered Date(s) Administered   Hepatitis A, Adult 09/04/2016, 03/27/2017   Influenza Split 12/09/2011  Influenza,inj,Quad PF,6+ Mos 11/29/2012, 11/01/2013, 11/20/2015, 12/29/2017, 04/03/2019, 12/13/2019, 03/28/2021   PFIZER(Purple Top)SARS-COV-2 Vaccination 05/20/2019, 06/15/2019, 01/11/2020   Pneumococcal Conjugate-13 03/28/2021   Pneumococcal Polysaccharide-23 08/01/2019   Tdap 06/07/2015   Zoster Recombinat (Shingrix) 08/01/2019, 10/03/2020     Objective: Vital Signs: BP (!) 149/91 (BP Location: Left Arm, Patient Position: Sitting, Cuff Size: Normal)   Pulse 76   Resp 15   Ht _0  (1.651 m)   Wt 202 lb 3.2 oz (91.7 kg)   LMP 04/01/2011   BMI 33.65 kg/m    Physical Exam Vitals and nursing note reviewed.  Constitutional:      Appearance: She is well-developed.  HENT:     Head: Normocephalic and atraumatic.  Eyes:     Conjunctiva/sclera: Conjunctivae normal.  Cardiovascular:     Rate and Rhythm: Normal rate and regular rhythm.     Heart sounds: Normal heart sounds.  Pulmonary:     Effort: Pulmonary effort is normal.     Breath sounds: Normal breath sounds.  Abdominal:     General: Bowel sounds are normal.     Palpations: Abdomen is soft.  Musculoskeletal:     Cervical back: Normal range of motion.  Skin:    General: Skin is warm and dry.     Capillary Refill: Capillary refill takes less than 2 seconds.  Neurological:     Mental Status: She is  alert and oriented to person, place, and time.  Psychiatric:        Behavior: Behavior normal.      Musculoskeletal Exam: C-spine, thoracic spine, and lumbar spine good ROM.  Right shoulder replacement has good ROM.  Left shoulder has good ROM. Elbow joints, wrist joints, MCPs, PIPs, and DIPs good ROM with no synovitis.  Tenderness over bilateral 2nd and 3rd MCP joints. PIP and DIP thickening consistent with osteoarthritis of both hands.  Complete fist formation bilaterally.  Hip joints have good ROM with no groin pain.  Knee joints have good ROM with no warmth or effusion.  Ankle joints have good ROM with tenderness bilaterally.  No synovitis over MTP joints.   CDAI Exam: CDAI Score: 4.8  Patient Global: 4 mm; Provider Global: 4 mm Swollen: 0 ; Tender: 6  Joint Exam 11/19/2021      Right  Left  MCP 2   Tender   Tender  MCP 3   Tender   Tender  Ankle   Tender   Tender     Investigation: No additional findings.  Imaging: No results found.  Recent Labs: Lab Results  Component Value Date   WBC 5.7 09/03/2021   HGB 13.4 09/03/2021   PLT 285.0 09/03/2021   NA 137 09/03/2021   K 4.8 09/03/2021   CL 100 09/03/2021   CO2 27 09/03/2021   GLUCOSE 93 09/03/2021   BUN 19 09/03/2021   CREATININE 0.61 09/03/2021   BILITOT 0.4 09/03/2021   ALKPHOS 74 09/03/2021   AST 21 09/03/2021   ALT 24 09/03/2021   PROT 7.4 09/03/2021   ALBUMIN 4.6 09/03/2021   CALCIUM 10.2 09/03/2021   CALCIUM 9.8 09/03/2021   GFRAA 117 08/31/2020   QFTBGOLDPLUS NEGATIVE 08/26/2021    Speciality Comments: Orencia stoped 10/04/20 Rinvoq started 10/11/20  Procedures:  No procedures performed Allergies: Cefdinir, Doxycycline, Codeine, Penicillins, Simvastatin, and Other     Assessment / Plan:     Visit Diagnoses: Rheumatoid arthritis of multiple sites with negative rheumatoid factor ([place]): No synovitis was noted on examination today.  She  has tenderness over bilateral second and third MCP joints as well  as tenderness along the ankle joints bilaterally.  ESR and CRP within normal limits on 10/30/2021.  She is currently holding [place] and Rinvoq due to being scheduled for an upcoming bilateral mastectomy on 11/28/2021 by Dr. Morley Kos. She has been holding both medications for the past 6 days and has started to notice increased arthralgias and joint stiffness but no active inflammation.  She has been taking Advil sparingly for symptomatic relief.  She is planning on proceeding with reconstructive surgery after the bilateral mastectomy but is unsure of the timing.  She is also unsure if she will require chemotherapy or radiation until after the lymph node biopsies are performed. She will require clearance prior to resuming Rinvoq and Arava after her surgeries are completed.  She was advised to notify us if she develops signs or symptoms of a flare.  She will follow-up in the office in 2 to 3 months or sooner if needed.  High risk medication use - Arava 20 mg 1 tablet by mouth daily and Rinvoq 15 mg 1 tablet by mouth daily.  Rinvoq added on at the end of July 2022. CBC and BMP drawn on 10/30/2021.  TB Gold negative on 08/26/2021. No recent or recurrent infections.  Counseled on the increase risk of venous thrombosis. Counseled about FDA black box warning of MACE (major adverse CV events including cardiovascular death, myocardial infarction, and stroke).  Reviewed with patient that there is the possibility of an increased risk of malignancy specifically lung cancer and lymphomas but it is not well understood if this increased risk is due to the medication or the disease state.  Lipid panel updated on 09/03/21: Total cholesterol 287, triglycerides 231. She has an upcoming physical exam in October 2023.    Osteopenia of multiple sites - DEXA updated on 08/12/19: The BMD measured at Forearm Radius 33% is 0.751 g/cm2 with a T-score of -1.4.  DEXA updated on 09/09/2021: The BMD measured at Forearm Radius 33% is 0.741 g/cm2  with a T-score of -1.5.  she is taking a calcium and vitamin D supplement daily.   Status post total shoulder replacement, right: Doing well.  Primary osteoarthritis of both hands: She has PIP and DIP thickening consistent with osteoarthritis of both hands.  Tenderness over bilateral second and third MCP joints but no synovitis noted.  Complete fist formation bilaterally.  Discussed the importance of joint protection and muscle strengthening.  Primary osteoarthritis of both feet: She continues to experience persistent discomfort in both feet especially in her ankle joints.  She has tenderness over bilateral ankle joints but no synovitis was noted.  Discussed the importance of wearing proper fitting shoes.  Pes cavus of both feet  Other medical conditions are listed as follows:   Hyperparathyroidism, primary ([place])  Anxiety and depression  Hashimoto's thyroiditis  Essential hypertension  History of gastroesophageal reflux (GERD)  History of miscarriage  History of hyperlipidemia  Hx of migraines  Former smoker  Orders: No orders of the defined types were placed in this encounter.  No orders of the defined types were placed in this encounter.   Follow-Up Instructions: Return in about 3 months (around 02/18/2022) for Rheumatoid arthritis, Osteoarthritis.   Ofilia Neas, PA-C  Note - This record has been created using Dragon software.  Chart creation errors have been sought, but may not always  have been located. Such creation errors do not reflect on  the standard of medical care.

## 2021-11-18 DIAGNOSIS — C50911 Malignant neoplasm of unspecified site of right female breast: Secondary | ICD-10-CM | POA: Diagnosis not present

## 2021-11-18 DIAGNOSIS — Z170 Estrogen receptor positive status [ER+]: Secondary | ICD-10-CM | POA: Diagnosis not present

## 2021-11-19 ENCOUNTER — Ambulatory Visit: Payer: BLUE CROSS/BLUE SHIELD | Attending: Physician Assistant | Admitting: Physician Assistant

## 2021-11-19 ENCOUNTER — Encounter: Payer: Self-pay | Admitting: Physician Assistant

## 2021-11-19 VITALS — BP 149/91 | HR 76 | Resp 15 | Ht 65.0 in | Wt 202.2 lb

## 2021-11-19 DIAGNOSIS — Z8719 Personal history of other diseases of the digestive system: Secondary | ICD-10-CM

## 2021-11-19 DIAGNOSIS — Z79899 Other long term (current) drug therapy: Secondary | ICD-10-CM | POA: Diagnosis not present

## 2021-11-19 DIAGNOSIS — Q6672 Congenital pes cavus, left foot: Secondary | ICD-10-CM

## 2021-11-19 DIAGNOSIS — Z8639 Personal history of other endocrine, nutritional and metabolic disease: Secondary | ICD-10-CM

## 2021-11-19 DIAGNOSIS — M8589 Other specified disorders of bone density and structure, multiple sites: Secondary | ICD-10-CM | POA: Diagnosis not present

## 2021-11-19 DIAGNOSIS — F419 Anxiety disorder, unspecified: Secondary | ICD-10-CM

## 2021-11-19 DIAGNOSIS — Z87891 Personal history of nicotine dependence: Secondary | ICD-10-CM

## 2021-11-19 DIAGNOSIS — M19071 Primary osteoarthritis, right ankle and foot: Secondary | ICD-10-CM

## 2021-11-19 DIAGNOSIS — M19042 Primary osteoarthritis, left hand: Secondary | ICD-10-CM

## 2021-11-19 DIAGNOSIS — M19041 Primary osteoarthritis, right hand: Secondary | ICD-10-CM

## 2021-11-19 DIAGNOSIS — M19072 Primary osteoarthritis, left ankle and foot: Secondary | ICD-10-CM

## 2021-11-19 DIAGNOSIS — M0609 Rheumatoid arthritis without rheumatoid factor, multiple sites: Secondary | ICD-10-CM | POA: Diagnosis not present

## 2021-11-19 DIAGNOSIS — Z96611 Presence of right artificial shoulder joint: Secondary | ICD-10-CM | POA: Diagnosis not present

## 2021-11-19 DIAGNOSIS — E063 Autoimmune thyroiditis: Secondary | ICD-10-CM

## 2021-11-19 DIAGNOSIS — F32A Depression, unspecified: Secondary | ICD-10-CM

## 2021-11-19 DIAGNOSIS — Z8669 Personal history of other diseases of the nervous system and sense organs: Secondary | ICD-10-CM

## 2021-11-19 DIAGNOSIS — Z8759 Personal history of other complications of pregnancy, childbirth and the puerperium: Secondary | ICD-10-CM

## 2021-11-19 DIAGNOSIS — I10 Essential (primary) hypertension: Secondary | ICD-10-CM

## 2021-11-19 DIAGNOSIS — E210 Primary hyperparathyroidism: Secondary | ICD-10-CM

## 2021-11-19 DIAGNOSIS — Q6671 Congenital pes cavus, right foot: Secondary | ICD-10-CM

## 2021-11-24 DIAGNOSIS — F4321 Adjustment disorder with depressed mood: Secondary | ICD-10-CM | POA: Diagnosis not present

## 2021-11-28 DIAGNOSIS — Z880 Allergy status to penicillin: Secondary | ICD-10-CM | POA: Diagnosis not present

## 2021-11-28 DIAGNOSIS — C50811 Malignant neoplasm of overlapping sites of right female breast: Secondary | ICD-10-CM | POA: Diagnosis not present

## 2021-11-28 DIAGNOSIS — N6012 Diffuse cystic mastopathy of left breast: Secondary | ICD-10-CM | POA: Diagnosis not present

## 2021-11-28 DIAGNOSIS — Z800 Family history of malignant neoplasm of digestive organs: Secondary | ICD-10-CM | POA: Diagnosis not present

## 2021-11-28 DIAGNOSIS — Z888 Allergy status to other drugs, medicaments and biological substances status: Secondary | ICD-10-CM | POA: Diagnosis not present

## 2021-11-28 DIAGNOSIS — Z87891 Personal history of nicotine dependence: Secondary | ICD-10-CM | POA: Diagnosis not present

## 2021-11-28 DIAGNOSIS — R920 Mammographic microcalcification found on diagnostic imaging of breast: Secondary | ICD-10-CM | POA: Diagnosis not present

## 2021-11-28 DIAGNOSIS — G8918 Other acute postprocedural pain: Secondary | ICD-10-CM | POA: Diagnosis not present

## 2021-11-28 DIAGNOSIS — G4733 Obstructive sleep apnea (adult) (pediatric): Secondary | ICD-10-CM | POA: Diagnosis not present

## 2021-11-28 DIAGNOSIS — I10 Essential (primary) hypertension: Secondary | ICD-10-CM | POA: Diagnosis not present

## 2021-11-28 DIAGNOSIS — Z808 Family history of malignant neoplasm of other organs or systems: Secondary | ICD-10-CM | POA: Diagnosis not present

## 2021-11-28 DIAGNOSIS — Z881 Allergy status to other antibiotic agents status: Secondary | ICD-10-CM | POA: Diagnosis not present

## 2021-11-28 DIAGNOSIS — M069 Rheumatoid arthritis, unspecified: Secondary | ICD-10-CM | POA: Diagnosis not present

## 2021-11-28 DIAGNOSIS — Z9012 Acquired absence of left breast and nipple: Secondary | ICD-10-CM | POA: Diagnosis not present

## 2021-11-28 DIAGNOSIS — Z170 Estrogen receptor positive status [ER+]: Secondary | ICD-10-CM | POA: Diagnosis not present

## 2021-11-28 DIAGNOSIS — Z885 Allergy status to narcotic agent status: Secondary | ICD-10-CM | POA: Diagnosis not present

## 2021-11-28 DIAGNOSIS — C50911 Malignant neoplasm of unspecified site of right female breast: Secondary | ICD-10-CM | POA: Diagnosis not present

## 2021-11-28 DIAGNOSIS — N6481 Ptosis of breast: Secondary | ICD-10-CM | POA: Diagnosis not present

## 2021-11-28 DIAGNOSIS — E039 Hypothyroidism, unspecified: Secondary | ICD-10-CM | POA: Diagnosis not present

## 2021-11-28 DIAGNOSIS — E6609 Other obesity due to excess calories: Secondary | ICD-10-CM | POA: Diagnosis not present

## 2021-11-28 DIAGNOSIS — Z882 Allergy status to sulfonamides status: Secondary | ICD-10-CM | POA: Diagnosis not present

## 2021-11-28 DIAGNOSIS — Z803 Family history of malignant neoplasm of breast: Secondary | ICD-10-CM | POA: Diagnosis not present

## 2021-11-28 DIAGNOSIS — N62 Hypertrophy of breast: Secondary | ICD-10-CM | POA: Diagnosis not present

## 2021-11-28 DIAGNOSIS — C50011 Malignant neoplasm of nipple and areola, right female breast: Secondary | ICD-10-CM | POA: Diagnosis not present

## 2021-11-28 DIAGNOSIS — D0511 Intraductal carcinoma in situ of right breast: Secondary | ICD-10-CM | POA: Diagnosis not present

## 2021-11-28 HISTORY — PX: MASTECTOMY: SHX3

## 2021-11-29 DIAGNOSIS — Z9013 Acquired absence of bilateral breasts and nipples: Secondary | ICD-10-CM | POA: Insufficient documentation

## 2021-12-12 DIAGNOSIS — C50911 Malignant neoplasm of unspecified site of right female breast: Secondary | ICD-10-CM | POA: Diagnosis not present

## 2021-12-12 DIAGNOSIS — Z170 Estrogen receptor positive status [ER+]: Secondary | ICD-10-CM | POA: Diagnosis not present

## 2021-12-18 ENCOUNTER — Other Ambulatory Visit: Payer: Self-pay | Admitting: Family Medicine

## 2021-12-18 DIAGNOSIS — G4733 Obstructive sleep apnea (adult) (pediatric): Secondary | ICD-10-CM | POA: Diagnosis not present

## 2021-12-19 ENCOUNTER — Ambulatory Visit: Payer: BLUE CROSS/BLUE SHIELD | Admitting: Pulmonary Disease

## 2021-12-20 ENCOUNTER — Other Ambulatory Visit: Payer: Self-pay | Admitting: Physician Assistant

## 2021-12-20 NOTE — Telephone Encounter (Signed)
Next Visit: 02/18/2022  Last Visit: 11/19/2021  Last Fill: 09/16/2021  DX: Rheumatoid arthritis of multiple sites with negative rheumatoid factor   Current Dose per office note 11/19/2021: Arava 20 mg 1 tablet by mouth daily   Labs: 10/30/2021 RBC 3.87, Hgb 12.2, Hct 35.0, CO2 31, Glucose 112,   Okay to refill Arava?

## 2021-12-22 DIAGNOSIS — F4321 Adjustment disorder with depressed mood: Secondary | ICD-10-CM | POA: Diagnosis not present

## 2021-12-25 DIAGNOSIS — Z9013 Acquired absence of bilateral breasts and nipples: Secondary | ICD-10-CM | POA: Diagnosis not present

## 2021-12-25 DIAGNOSIS — D0511 Intraductal carcinoma in situ of right breast: Secondary | ICD-10-CM | POA: Diagnosis not present

## 2021-12-25 DIAGNOSIS — Y838 Other surgical procedures as the cause of abnormal reaction of the patient, or of later complication, without mention of misadventure at the time of the procedure: Secondary | ICD-10-CM | POA: Diagnosis not present

## 2021-12-25 DIAGNOSIS — M96843 Postprocedural seroma of a musculoskeletal structure following other procedure: Secondary | ICD-10-CM | POA: Diagnosis not present

## 2021-12-29 DIAGNOSIS — F4321 Adjustment disorder with depressed mood: Secondary | ICD-10-CM | POA: Diagnosis not present

## 2021-12-31 DIAGNOSIS — N6489 Other specified disorders of breast: Secondary | ICD-10-CM | POA: Diagnosis not present

## 2021-12-31 DIAGNOSIS — D0511 Intraductal carcinoma in situ of right breast: Secondary | ICD-10-CM | POA: Diagnosis not present

## 2022-01-01 ENCOUNTER — Ambulatory Visit: Payer: BLUE CROSS/BLUE SHIELD | Admitting: Pulmonary Disease

## 2022-01-02 DIAGNOSIS — N6489 Other specified disorders of breast: Secondary | ICD-10-CM | POA: Diagnosis not present

## 2022-01-02 DIAGNOSIS — C50911 Malignant neoplasm of unspecified site of right female breast: Secondary | ICD-10-CM | POA: Diagnosis not present

## 2022-01-02 DIAGNOSIS — Z170 Estrogen receptor positive status [ER+]: Secondary | ICD-10-CM | POA: Diagnosis not present

## 2022-01-08 ENCOUNTER — Ambulatory Visit (INDEPENDENT_AMBULATORY_CARE_PROVIDER_SITE_OTHER): Payer: BLUE CROSS/BLUE SHIELD | Admitting: Podiatry

## 2022-01-08 DIAGNOSIS — M778 Other enthesopathies, not elsewhere classified: Secondary | ICD-10-CM

## 2022-01-08 MED ORDER — BETAMETHASONE SOD PHOS & ACET 6 (3-3) MG/ML IJ SUSP: 3.0000 mg | Freq: Once | INTRAMUSCULAR | Status: DC

## 2022-01-08 NOTE — Progress Notes (Signed)
Chief Complaint  Patient presents with   Follow-up    3 month follow up capsulitis of right foot. Requesting injection.     Subjective:  61 year old female PMHx rheumatoid arthritis presenting today for follow-up management of chronic bilateral midtarsal pain.  She is being seen by rheumatology however recently she has had to discontinue her rheumatology medications because she had double mastectomies and is still in the healing phases.  Past Medical History:  Diagnosis Date   Allergic rhinitis 05/31/2012   Breast cancer ([place])    right breast, dx 10/2021.   Chest pain    PT SAW CARDIOLOGIST DR. Burt Knack - STRESS ECHO DONE NEGATIVE - NO PROBLEM SINCE   Chicken pox as achild   Cough 11/29/2012   RESOLVED - IT WAS CAUSED BY LISINOPRIL   Elevated LFTs 05/31/2012   Eustachian tube dysfunction 07/27/2017   GERD (gastroesophageal reflux disease)    H/O tobacco use, presenting hazards to health 11/29/2012   Smoked 1 1/2 ppd for 20 years quit in roughtly 2007   Headache 11/25/2015   Hematuria 04/09/2014   Hoarseness 02/04/2012   RESOLVED - THOUGHT TO HAVE BEEN ALLERY RELATED   Hyperglycemia 04/06/2014   Hyperlipidemia    Hyperparathyroidism ([place])    Hypertension    Obesity, unspecified 05/31/2012   Ocular migraine    Overactive bladder 05/31/2012   Preventative health care 02/04/2012   PTSD (post-traumatic stress disorder)    Rapid heart beat    PT FEELS PROB RELATED TO HYPER PARATHYROIDISM   Rheumatoid arthritis ([facility])    SUI (stress urinary incontinence, female) 02/04/2012   Sees Dr Perlie Gold and they have discussed a bladder tack but so far she declines   Past Surgical History:  Procedure Laterality Date   BREAST EXCISIONAL BIOPSY Left    Age 70 Fibroadenoma   BREAST SURGERY     left breast excision of adenoma   btl     COLONOSCOPY  07/2020   FOOT SURGERY     PARATHYROIDECTOMY Right 08/12/2013   Procedure: right inferior frozen section PARATHYROIDECTOMY;  Surgeon: Earnstine Regal, MD;  Location: [facility];  Service: General;  Laterality: Right;   PELVIC LAPAROSCOPY  03/2004   TOTAL SHOULDER REPLACEMENT Right 01/2019   TUBAL LIGATION     WISDOM TOOTH EXTRACTION  30 yrs ago   Allergies  Allergen Reactions   Cefdinir Other (See Comments) and Nausea And Vomiting    Severe Stomach cramps and diarrhea for several days.   Doxycycline Diarrhea   Codeine Nausea And Vomiting   Penicillins     RASH, ITCHING   Simvastatin     myalgias   Other Rash   Objective: Physical Exam General: The patient is alert and oriented x3 in no acute distress.  Dermatology: Skin is cool, dry and supple bilateral lower extremities. Negative for open lesions or macerations.  Vascular: Palpable pedal pulses bilaterally.  There is some redness with edema noted to the right fifth digit.  No open wounds in this area.  No heat.  Neurological: Epicritic and protective threshold grossly intact bilaterally.   Musculoskeletal Exam: Chronic pain on palpation throughout the midtarsal joint bilateral  Radiographic exam B/L feet 04/03/2021 Normal osseous mineralization.  There is some degenerative changes noted throughout the lesser TMT joints consistent with the patient's given history of rheumatoid and osteoarthritis.  No acute fractures identified.  Assessment: 1. s/p excision of benign skin lesion left plantar foot. DOS: 06/13/2021 2. Chronic bilateral midfoot capsulitis  Plan of  Care:  1. Patient was evaluated.  Patient declined x-rays of the right fifth digit. 2.  Injection of 0.5 cc [place] Soluspan injection of the bilateral midtarsal joint 3.  Continue close management with rheumatology 4.  Continue custom molded orthotics 5.  Return to clinic as needed, or about 3 months for follow-up care  Edrick Kins, DPM [facility]  Dr. Edrick Kins, DPM    [address], [place] [address]                Office [phone]   Fax [phone]

## 2022-01-09 ENCOUNTER — Encounter: Payer: Self-pay | Admitting: Family Medicine

## 2022-01-09 ENCOUNTER — Ambulatory Visit (HOSPITAL_BASED_OUTPATIENT_CLINIC_OR_DEPARTMENT_OTHER)
Admission: RE | Admit: 2022-01-09 | Discharge: 2022-01-09 | Disposition: A | Discharge status: Home / Self Care | Payer: BLUE CROSS/BLUE SHIELD | Source: Ambulatory Visit | Attending: Family Medicine | Admitting: Family Medicine

## 2022-01-09 ENCOUNTER — Ambulatory Visit: Payer: BLUE CROSS/BLUE SHIELD | Admitting: Family Medicine

## 2022-01-09 VITALS — BP 140/92 | HR 85 | Temp 98.4°F | Resp 18 | Ht 65.0 in | Wt 200.6 lb

## 2022-01-09 DIAGNOSIS — R509 Fever, unspecified: Secondary | ICD-10-CM | POA: Diagnosis not present

## 2022-01-09 DIAGNOSIS — J40 Bronchitis, not specified as acute or chronic: Secondary | ICD-10-CM | POA: Insufficient documentation

## 2022-01-09 DIAGNOSIS — R059 Cough, unspecified: Secondary | ICD-10-CM | POA: Diagnosis not present

## 2022-01-09 DIAGNOSIS — N760 Acute vaginitis: Secondary | ICD-10-CM

## 2022-01-09 LAB — POCT INFLUENZA A/B
Influenza A, POC: NEGATIVE
Influenza B, POC: NEGATIVE

## 2022-01-09 LAB — POC COVID19 BINAXNOW: SARS Coronavirus 2 Ag: NEGATIVE

## 2022-01-09 MED ORDER — AZITHROMYCIN 250 MG PO TABS: ORAL_TABLET | ORAL | 0 refills | Status: DC

## 2022-01-09 MED ORDER — FLUCONAZOLE 150 MG PO TABS: ORAL_TABLET | ORAL | 0 refills | Status: DC

## 2022-01-09 NOTE — Progress Notes (Signed)
 Established Patient Office Visit  Subjective   Patient ID: Sandra [name], female    DOB: 10/06/1960  Age: 60 y.o. MRN: [id number]  Chief Complaint  Patient presents with   Cough    Pt states having fever on and off for several weeks of 100.5. Pt states nonproductive cough and post nasal drip a few weeks ago and has some wheezing prior to surgery. COVID negative on Monday. Pt states not taking blood pressure meds yet today    HPI Pt here c/o intermittant low grade fever --- she just finished bactrim ds for seroma-- she is s/p mastectomy--- she had covid memorial day weekend.     + nonproductive cough since surgery and pnd ,  she has had a scratchy throat.    Sore throat is gone .  She has not taken tylenol or ib for last 2 days  Patient Active Problem List   Diagnosis Date Noted   Bronchitis 01/09/2022   Insomnia 09/04/2021   Osteopenia 09/04/2021   Pulmonary nodule 03/29/2021   Rheumatoid arthritis (HCC) 12/02/2019   Anxiety and depression 08/01/2019   Arthralgia 05/04/2019   OSA (obstructive sleep apnea) 05/03/2019   Thyroiditis 12/20/2018   Muscle cramp 12/20/2018   Eustachian tube dysfunction 07/27/2017   Headache 11/25/2015   Acute bacterial sinusitis 03/26/2015   Hyperglycemia 04/06/2014   Hyperparathyroidism, primary (HCC) 07/12/2013   Hypercalcemia 12/16/2012   H/O tobacco use, presenting hazards to health 11/29/2012   Overactive bladder 05/31/2012   Elevated LFTs 05/31/2012   Obesity (BMI 30-39.9) 05/31/2012   Allergic rhinitis 05/31/2012   Colon cancer screening 02/04/2012   SUI (stress urinary incontinence, female) 02/04/2012   GERD (gastroesophageal reflux disease)    Rotator cuff syndrome of right shoulder 06/11/2011   Hypertension 01/03/2011   Hyperlipidemia 01/03/2011   KNEE PAIN, BILATERAL 09/28/2008   ANSERINE BURSITIS 09/28/2008   CAVUS DEFORMITY OF FOOT, ACQUIRED 09/28/2008   Past Medical History:  Diagnosis Date   Allergic rhinitis 05/31/2012    Breast cancer (HCC)    right breast, dx 10/2021.   Chest pain    PT SAW CARDIOLOGIST DR. COOPER - STRESS ECHO DONE NEGATIVE - NO PROBLEM SINCE   Chicken pox as achild   Cough 11/29/2012   RESOLVED - IT WAS CAUSED BY LISINOPRIL   Elevated LFTs 05/31/2012   Eustachian tube dysfunction 07/27/2017   GERD (gastroesophageal reflux disease)    H/O tobacco use, presenting hazards to health 11/29/2012   Smoked 1 1/2 ppd for 20 years quit in roughtly 2007   Headache 11/25/2015   Hematuria 04/09/2014   Hoarseness 02/04/2012   RESOLVED - THOUGHT TO HAVE BEEN ALLERY RELATED   Hyperglycemia 04/06/2014   Hyperlipidemia    Hyperparathyroidism (HCC)    Hypertension    Obesity, unspecified 05/31/2012   Ocular migraine    Overactive bladder 05/31/2012   Preventative health care 02/04/2012   PTSD (post-traumatic stress disorder)    Rapid heart beat    PT FEELS PROB RELATED TO HYPER PARATHYROIDISM   Rheumatoid arthritis (HCC)    SUI (stress urinary incontinence, female) 02/04/2012   Sees Dr Messinger and they have discussed a bladder tack but so far she declines   Past Surgical History:  Procedure Laterality Date   BREAST EXCISIONAL BIOPSY Left    Age 19 Fibroadenoma   BREAST SURGERY     left breast excision of adenoma   btl     COLONOSCOPY  07/2020   FOOT SURGERY       PARATHYROIDECTOMY Right 08/12/2013   Procedure: right inferior frozen section PARATHYROIDECTOMY;  Surgeon: Earnstine Regal, MD;  Location: [facility];  Service: General;  Laterality: Right;   PELVIC LAPAROSCOPY  03/2004   TOTAL SHOULDER REPLACEMENT Right 01/2019   TUBAL LIGATION     WISDOM TOOTH EXTRACTION  30 yrs ago   Social History   Tobacco Use   Smoking status: Former    Packs/day: 1.00    Years: 20.00    Total pack years: 20.00    Types: Cigarettes    Quit date: 03/10/2005    Years since quitting: 16.8    Passive exposure: Never   Smokeless tobacco: Never  Vaping Use   Vaping Use: Never used  Substance Use Topics    Alcohol use: Not Currently   Drug use: No   Social History   Socioeconomic History   Marital status: Married    Spouse name: Not on file   Number of children: 2   Years of education: Not on file   Highest education level: Not on file  Occupational History   Occupation: Programmer, multimedia: [employer]  Tobacco Use   Smoking status: Former    Packs/day: 1.00    Years: 20.00    Total pack years: 20.00    Types: Cigarettes    Quit date: 03/10/2005    Years since quitting: 16.8    Passive exposure: Never   Smokeless tobacco: Never  Vaping Use   Vaping Use: Never used  Substance and Sexual Activity   Alcohol use: Not Currently   Drug use: No   Sexual activity: Yes    Partners: Male  Other Topics Concern   Not on file  Social History Narrative   Not on file   Social Determinants of Health   Financial Resource Strain: Not on file  Food Insecurity: Not on file  Transportation Needs: Not on file  Physical Activity: Not on file  Stress: Not on file  Social Connections: Not on file  Intimate Partner Violence: Not on file   Family Status  Relation Name Status   Mother  Deceased       29   Father  Deceased       48   Sister Izora Gala Alive       74   Sister Santiago Glad Alive       [address] Alive       57   Son [place] Alive       17/ healthy   Son Quita Skye, Deceased       ewings sarcoma    Ethlyn Daniels  (Not Specified)   Annamarie Major  (Not Specified)   MGM  Deceased at age 23   MGF  Deceased at age 49   Avalon  Deceased at age 55   PGF  Deceased at age 80's   Family History  Problem Relation Age of Onset   Hypertension Mother 14       alive   Renal cancer Mother        renal   Hyperlipidemia Mother    Osteoporosis Mother        humerus    Macular degeneration Mother    Tremor Mother    Parkinson's disease Mother    Hypertension Father 31       alive   Atrial fibrillation Father    Colon cancer Father        colon   Hyperlipidemia Father  Stroke Father     Alzheimer's disease Father    Hypertension Sister    Hypertension Brother 56       alive   Tremor Brother    Asthma Son    Eczema Son    Healthy Son    Heart disease Paternal Aunt    Heart disease Paternal Uncle    Lung cancer Maternal Grandfather        lung/ smoker   Emphysema Maternal Grandfather    Heart disease Paternal Grandmother    Other Paternal Grandfather        black lung   Heart disease Paternal Grandfather    Allergies  Allergen Reactions   Cefdinir Other (See Comments) and Nausea And Vomiting    Severe Stomach cramps and diarrhea for several days.   Doxycycline Diarrhea   Codeine Nausea And Vomiting   Penicillins     RASH, ITCHING   Simvastatin     myalgias   Other Rash      Review of Systems  Constitutional:  Positive for chills, fever and malaise/fatigue.  HENT:  Positive for congestion. Negative for hearing loss, sinus pain and sore throat.   Eyes:  Negative for discharge.  Respiratory:  Positive for cough. Negative for sputum production, shortness of breath and wheezing.   Cardiovascular:  Negative for chest pain, palpitations and leg swelling.  Gastrointestinal:  Negative for abdominal pain, blood in stool, constipation, diarrhea, heartburn, nausea and vomiting.  Genitourinary:  Negative for dysuria, frequency, hematuria and urgency.  Musculoskeletal:  Negative for back pain, falls and myalgias.  Skin:  Negative for rash.  Neurological:  Negative for dizziness, sensory change, loss of consciousness, weakness and headaches.  Endo/Heme/Allergies:  Negative for environmental allergies. Does not bruise/bleed easily.  Psychiatric/Behavioral:  Negative for depression and suicidal ideas. The patient is not nervous/anxious and does not have insomnia.       Objective:     BP (!) 140/92 (BP Location: Left Arm, Patient Position: Sitting, Cuff Size: Normal)   Pulse 85   Temp 98.4 F (36.9 C) (Oral)   Resp 18   Ht 5' 5" (1.651 m)   Wt 200 lb 9.6 oz (91  kg)   LMP 04/01/2011   SpO2 95%   BMI 33.38 kg/m  BP Readings from Last 3 Encounters:  01/09/22 (!) 140/92  11/19/21 (!) 149/91  09/03/21 126/89   Wt Readings from Last 3 Encounters:  01/09/22 200 lb 9.6 oz (91 kg)  11/19/21 202 lb 3.2 oz (91.7 kg)  09/03/21 198 lb (89.8 kg)   SpO2 Readings from Last 3 Encounters:  01/09/22 95%  09/03/21 96%  08/13/21 94%      Physical Exam Vitals and nursing note reviewed.  Constitutional:      Appearance: She is well-developed.  HENT:     Head: Normocephalic and atraumatic.     Nose: Nose normal.  Eyes:     Conjunctiva/sclera: Conjunctivae normal.  Neck:     Thyroid: No thyromegaly.     Vascular: No carotid bruit or JVD.  Cardiovascular:     Rate and Rhythm: Normal rate and regular rhythm.     Heart sounds: Normal heart sounds. No murmur heard. Pulmonary:     Effort: Pulmonary effort is normal. No respiratory distress.     Breath sounds: Normal breath sounds. No wheezing or rales.  Chest:     Chest wall: No tenderness.  Musculoskeletal:     Cervical back: Normal range of motion and neck supple.    Neurological:     Mental Status: She is alert and oriented to person, place, and time.      Results for orders placed or performed in visit on 01/09/22  POC COVID-19  Result Value Ref Range   SARS Coronavirus 2 Ag Negative Negative  POCT Influenza A/B  Result Value Ref Range   Influenza A, POC Negative Negative   Influenza B, POC Negative Negative    Last CBC Lab Results  Component Value Date   WBC 5.7 09/03/2021   HGB 13.4 09/03/2021   HCT 40.6 09/03/2021   MCV 91.2 09/03/2021   MCH 30.1 08/26/2021   RDW 13.9 09/03/2021   PLT 285.0 09/03/2021   Last metabolic panel Lab Results  Component Value Date   GLUCOSE 93 09/03/2021   NA 137 09/03/2021   K 4.8 09/03/2021   CL 100 09/03/2021   CO2 27 09/03/2021   BUN 19 09/03/2021   CREATININE 0.61 09/03/2021   EGFR 102 08/26/2021   CALCIUM 10.2 09/03/2021   CALCIUM  9.8 09/03/2021   PHOS 2.6 04/04/2014   PROT 7.4 09/03/2021   ALBUMIN 4.6 09/03/2021   BILITOT 0.4 09/03/2021   ALKPHOS 74 09/03/2021   AST 21 09/03/2021   ALT 24 09/03/2021   ANIONGAP 10 11/01/2020   Last lipids Lab Results  Component Value Date   CHOL 287 (H) 09/03/2021   HDL 69.80 09/03/2021   LDLCALC 163 (H) 11/08/2020   LDLDIRECT 179.0 09/03/2021   TRIG 231.0 (H) 09/03/2021   CHOLHDL 4 09/03/2021   Last hemoglobin A1c Lab Results  Component Value Date   HGBA1C 5.9 09/03/2021   Last thyroid functions Lab Results  Component Value Date   TSH 0.93 09/03/2021   Last vitamin D Lab Results  Component Value Date   VD25OH 57.88 09/03/2021   Last vitamin B12 and Folate No results found for: "VITAMINB12", "FOLATE"    The 10-year ASCVD risk score (Arnett DK, et al., 2019) is: 5.4%    Assessment & Plan:   Problem List Items Addressed This Visit       Unprioritized   Bronchitis - Primary    Cxr  Abx per orders  Check labs  F/u pcp if no better  Neg covid and flu      Relevant Medications   azithromycin (ZITHROMAX Z-PAK) 250 MG tablet   Other Relevant Orders   DG Chest 2 View (Completed)   Other Visit Diagnoses     Chills with fever       Relevant Medications   azithromycin (ZITHROMAX Z-PAK) 250 MG tablet   Other Relevant Orders   POC COVID-19 (Completed)   POCT Influenza A/B (Completed)   CBC with Differential/Platelet   Comprehensive metabolic panel   Acute vaginitis       Relevant Medications   fluconazole (DIFLUCAN) 150 MG tablet       Return if symptoms worsen or fail to improve.     R Lowne Chase, DO  

## 2022-01-09 NOTE — Patient Instructions (Signed)

## 2022-01-09 NOTE — Assessment & Plan Note (Addendum)
Cxr  Abx per orders  Check labs  F/u pcp if no better  Neg covid and flu

## 2022-01-10 LAB — COMPREHENSIVE METABOLIC PANEL
ALT: 12 U/L (ref 0–35)
AST: 13 U/L (ref 0–37)
Albumin: 3.9 g/dL (ref 3.5–5.2)
Alkaline Phosphatase: 81 U/L (ref 39–117)
BUN: 14 mg/dL (ref 6–23)
CO2: 28 mEq/L (ref 19–32)
Calcium: 9.4 mg/dL (ref 8.4–10.5)
Chloride: 98 mEq/L (ref 96–112)
Creatinine, Ser: 0.49 mg/dL (ref 0.40–1.20)
GFR: 102.76 mL/min (ref >60.00)
Glucose, Bld: 186 mg/dL — ABNORMAL HIGH (ref 70–99)
Potassium: 3.9 mEq/L (ref 3.5–5.1)
Sodium: 137 mEq/L (ref 135–145)
Total Bilirubin: 0.3 mg/dL (ref 0.2–1.2)
Total Protein: 7.1 g/dL (ref 6.0–8.3)

## 2022-01-10 LAB — CBC WITH DIFFERENTIAL/PLATELET
Basophils Absolute: 0 10*3/uL (ref 0.0–0.1)
Basophils Relative: 0.3 % (ref 0.0–3.0)
Eosinophils Absolute: 0 10*3/uL (ref 0.0–0.7)
Eosinophils Relative: 0.1 % (ref 0.0–5.0)
HCT: 33.6 % — ABNORMAL LOW (ref 36.0–46.0)
Hemoglobin: 10.8 g/dL — ABNORMAL LOW (ref 12.0–15.0)
Lymphocytes Relative: 16.6 % (ref 12.0–46.0)
Lymphs Abs: 2 10*3/uL (ref 0.7–4.0)
MCHC: 32.2 g/dL (ref 30.0–36.0)
MCV: 89 fl (ref 78.0–100.0)
Monocytes Absolute: 0.8 10*3/uL (ref 0.1–1.0)
Monocytes Relative: 6.8 % (ref 3.0–12.0)
Neutro Abs: 9.2 10*3/uL — ABNORMAL HIGH (ref 1.4–7.7)
Neutrophils Relative %: 76.2 % (ref 43.0–77.0)
Platelets: 310 10*3/uL (ref 150.0–400.0)
RBC: 3.77 Mil/uL — ABNORMAL LOW (ref 3.87–5.11)
RDW: 14.1 % (ref 11.5–15.5)
WBC: 12 10*3/uL — ABNORMAL HIGH (ref 4.0–10.5)

## 2022-01-15 DIAGNOSIS — F4321 Adjustment disorder with depressed mood: Secondary | ICD-10-CM | POA: Diagnosis not present

## 2022-01-18 DIAGNOSIS — G4733 Obstructive sleep apnea (adult) (pediatric): Secondary | ICD-10-CM | POA: Diagnosis not present

## 2022-02-06 ENCOUNTER — Telehealth (HOSPITAL_BASED_OUTPATIENT_CLINIC_OR_DEPARTMENT_OTHER): Payer: Self-pay

## 2022-02-06 DIAGNOSIS — Z170 Estrogen receptor positive status [ER+]: Secondary | ICD-10-CM | POA: Diagnosis not present

## 2022-02-06 DIAGNOSIS — C50911 Malignant neoplasm of unspecified site of right female breast: Secondary | ICD-10-CM | POA: Diagnosis not present

## 2022-02-06 NOTE — Telephone Encounter (Signed)
Left message for pt to reschedule 53yrfollow-up with alva

## 2022-02-06 NOTE — Progress Notes (Unsigned)
Office Visit Note  Patient: Sandra [name]             Date of Birth: 05-15-1960           MRN: [id number]             PCP: [name], MD Referring: [name], MD Visit Date: 02/18/2022 Occupation: '@GUAROCC'$ @  Subjective:  Discuss restarting medications    History of Present Illness: Sandra [name] is a 61 y.o. female with history of seronegative rheumatoid arthritis and osteoarthritis.  Patient was last seen in the office on 11/19/2021.  At that time she was holding rinvoq and arava after being diagnosed with malignant neoplasm of the right breast.  Patient underwent a bilateral mastectomy on 11/28/2021.  Patient reports that she experienced delayed healing and developed seromas as well as cellulitis.  She states that her incisions have completely healed.  Patient reports that she will not require chemotherapy or radiation therapy.  She is scheduled for implant-based reconstruction starting on 05/08/2022.  She has not yet restarted rinvoq or arava. She is starting to have increased pain and inflammation in her hands and feet.  She has has had occasional pain in both shoulder joints.  She would like to restart on her medications for the next couple of months and we will plan to discontinue at least 2 weeks prior to surgery.  She has been taking ibuprofen very sparingly for pain relief. She denies any recent or recurrent infections.  She is up-to-date with the flu shot, Shingrix vaccines, and pneumonia vaccine. She has been sleeping better at night since taking magnesium at bedtime.  Her energy level has been stable overall.    Activities of Daily Living:  Patient reports morning stiffness for 2-3 hours.   Patient Reports nocturnal pain.  Difficulty dressing/grooming: Denies Difficulty climbing stairs: Denies Difficulty getting out of chair: Denies Difficulty using hands for taps, buttons, cutlery, and/or writing: Reports  Review of Systems  Constitutional:  Positive for  fatigue.  HENT:  Positive for mouth dryness. Negative for mouth sores.   Eyes:  Positive for dryness.  Respiratory:  Positive for shortness of breath.   Cardiovascular:  Negative for chest pain and palpitations.  Gastrointestinal:  Negative for blood in stool, constipation and diarrhea.  Endocrine: Negative for increased urination.  Genitourinary:  Negative for involuntary urination.  Musculoskeletal:  Positive for joint pain, joint pain, joint swelling and morning stiffness. Negative for gait problem, myalgias, muscle weakness, muscle tenderness and myalgias.  Skin:  Negative for color change, rash, hair loss and sensitivity to sunlight.  Allergic/Immunologic: Negative for susceptible to infections.  Neurological:  Negative for dizziness and headaches.  Hematological:  Negative for swollen glands.  Psychiatric/Behavioral:  Negative for depressed mood and sleep disturbance. The patient is not nervous/anxious.     PMFS History:  Patient Active Problem List   Diagnosis Date Noted   Bronchitis 01/09/2022   Insomnia 09/04/2021   Osteopenia 09/04/2021   Pulmonary nodule 03/29/2021   Rheumatoid arthritis ([place]) 12/02/2019   Anxiety and depression 08/01/2019   Arthralgia 05/04/2019   OSA (obstructive sleep apnea) 05/03/2019   Thyroiditis 12/20/2018   Muscle cramp 12/20/2018   Eustachian tube dysfunction 07/27/2017   Headache 11/25/2015   Acute bacterial sinusitis 03/26/2015   Hyperglycemia 04/06/2014   Hyperparathyroidism, primary ([place]) 07/12/2013   Hypercalcemia 12/16/2012   H/O tobacco use, presenting hazards to health 11/29/2012   Overactive bladder 05/31/2012   Elevated LFTs 05/31/2012   Obesity (  BMI 30-39.9) 05/31/2012   Allergic rhinitis 05/31/2012   Colon cancer screening 02/04/2012   SUI (stress urinary incontinence, female) 02/04/2012   GERD (gastroesophageal reflux disease)    Rotator cuff syndrome of right shoulder 06/11/2011   Hypertension 01/03/2011   Hyperlipidemia  01/03/2011   KNEE PAIN, BILATERAL 09/28/2008   ANSERINE BURSITIS 09/28/2008   CAVUS DEFORMITY OF FOOT, ACQUIRED 09/28/2008    Past Medical History:  Diagnosis Date   Allergic rhinitis 05/31/2012   Breast cancer ([place])    right breast, dx 10/2021.   Chest pain    PT SAW CARDIOLOGIST DR. Burt Knack - STRESS ECHO DONE NEGATIVE - NO PROBLEM SINCE   Chicken pox as achild   Cough 11/29/2012   RESOLVED - IT WAS CAUSED BY LISINOPRIL   Elevated LFTs 05/31/2012   Eustachian tube dysfunction 07/27/2017   GERD (gastroesophageal reflux disease)    H/O tobacco use, presenting hazards to health 11/29/2012   Smoked 1 1/2 ppd for 20 years quit in roughtly 2007   Headache 11/25/2015   Hematuria 04/09/2014   Hoarseness 02/04/2012   RESOLVED - THOUGHT TO HAVE BEEN ALLERY RELATED   Hyperglycemia 04/06/2014   Hyperlipidemia    Hyperparathyroidism ([place])    Hypertension    Obesity, unspecified 05/31/2012   Ocular migraine    Overactive bladder 05/31/2012   Preventative health care 02/04/2012   PTSD (post-traumatic stress disorder)    Rapid heart beat    PT FEELS PROB RELATED TO HYPER PARATHYROIDISM   Rheumatoid arthritis ([place])    SUI (stress urinary incontinence, female) 02/04/2012   Sees Dr Perlie Gold and they have discussed a bladder tack but so far she declines    Family History  Problem Relation Age of Onset   Hypertension Mother 11       alive   Renal cancer Mother        renal   Hyperlipidemia Mother    Osteoporosis Mother        humerus    Macular degeneration Mother    Tremor Mother    Parkinson's disease Mother    Hypertension Father 47       alive   Atrial fibrillation Father    Colon cancer Father        colon   Hyperlipidemia Father    Stroke Father    Alzheimer's disease Father    Hypertension Sister    Hypertension Brother 47       alive   Tremor Brother    Asthma Son    Eczema Son    Healthy Son    Heart disease Paternal Aunt    Heart disease Paternal Uncle    Lung  cancer Maternal Grandfather        lung/ smoker   Emphysema Maternal Grandfather    Heart disease Paternal Grandmother    Other Paternal Grandfather        black lung   Heart disease Paternal Grandfather    Past Surgical History:  Procedure Laterality Date   BREAST EXCISIONAL BIOPSY Left    Age 82 Fibroadenoma   BREAST SURGERY     left breast excision of adenoma   btl     COLONOSCOPY  07/2020   FOOT SURGERY     MASTECTOMY Bilateral 11/28/2021   PARATHYROIDECTOMY Right 08/12/2013   Procedure: right inferior frozen section PARATHYROIDECTOMY;  Surgeon: Earnstine Regal, MD;  Location: [facility];  Service: General;  Laterality: Right;   PELVIC LAPAROSCOPY  03/2004   TOTAL SHOULDER REPLACEMENT  Right 01/2019   TUBAL LIGATION     WISDOM TOOTH EXTRACTION  30 yrs ago   Social History   Social History Narrative   Not on file   Immunization History  Administered Date(s) Administered   Hepatitis A, Adult 09/04/2016, 03/27/2017   Influenza Split 12/09/2011   Influenza,inj,Quad PF,6+ Mos 11/29/2012, 11/01/2013, 11/20/2015, 12/29/2017, 04/03/2019, 12/13/2019, 03/28/2021   PFIZER(Purple Top)SARS-COV-2 Vaccination 05/20/2019, 06/15/2019, 01/11/2020   Pneumococcal Conjugate-13 03/28/2021   Pneumococcal Polysaccharide-23 08/01/2019   Tdap 06/07/2015   Zoster Recombinat (Shingrix) 08/01/2019, 10/03/2020     Objective: Vital Signs: BP (!) 158/103 (BP Location: Left Arm, Patient Position: Sitting, Cuff Size: Normal)   Pulse 87   Resp 16   Ht '5\' 5"'$  (1.651 m)   Wt 206 lb (93.4 kg)   LMP 04/01/2011   BMI 34.28 kg/m    Physical Exam Vitals and nursing note reviewed.  Constitutional:      Appearance: She is well-developed.  HENT:     Head: Normocephalic and atraumatic.  Eyes:     Conjunctiva/sclera: Conjunctivae normal.  Cardiovascular:     Rate and Rhythm: Normal rate and regular rhythm.     Heart sounds: Normal heart sounds.  Pulmonary:     Effort: Pulmonary effort is normal.      Breath sounds: Normal breath sounds.  Abdominal:     General: Bowel sounds are normal.     Palpations: Abdomen is soft.  Musculoskeletal:     Cervical back: Normal range of motion.  Skin:    General: Skin is warm and dry.     Capillary Refill: Capillary refill takes less than 2 seconds.  Neurological:     Mental Status: She is alert and oriented to person, place, and time.  Psychiatric:        Behavior: Behavior normal.      Musculoskeletal Exam: C-spine has good range of motion.  No midline spinal tenderness.  Shoulder joints have good range of motion.  Elbow joints have good range of motion with no tenderness or synovitis.  Both wrist joints have good range of motion with no synovitis.  Tenderness over MCP joints but no obvious synovitis noted.  Complete fist formation bilaterally.  PIP and DIP thickening consistent with osteoarthritis of both hands.  Hip joints have good range of motion with no groin pain.  Knee joints have good range of motion with no warmth or effusion.  Ankle joints have good range of motion with tenderness and mild warmth.  Tenderness over MTP joints.   CDAI Exam: CDAI Score: 6.8  Patient Global: 4 mm; Provider Global: 4 mm Swollen: 0 ; Tender: 14  Joint Exam 02/18/2022      Right  Left  MCP 2   Tender   Tender  MCP 3   Tender   Tender  MCP 4   Tender   Tender  Ankle   Tender   Tender  MTP 1   Tender   Tender  MTP 2   Tender   Tender  MTP 3   Tender   Tender     Investigation: No additional findings.  Imaging: No results found.  Recent Labs: Lab Results  Component Value Date   WBC 12.0 (H) 01/09/2022   HGB 10.8 (L) 01/09/2022   PLT 310.0 01/09/2022   NA 137 01/09/2022   K 3.9 01/09/2022   CL 98 01/09/2022   CO2 28 01/09/2022   GLUCOSE 186 (H) 01/09/2022   BUN 14 01/09/2022   CREATININE  0.49 01/09/2022   BILITOT 0.3 01/09/2022   ALKPHOS 81 01/09/2022   AST 13 01/09/2022   ALT 12 01/09/2022   PROT 7.1 01/09/2022   ALBUMIN 3.9 01/09/2022    CALCIUM 9.4 01/09/2022   GFRAA 117 08/31/2020   QFTBGOLDPLUS NEGATIVE 08/26/2021    Speciality Comments: Orencia stoped 10/04/20 Rinvoq started 10/11/20  Procedures:  No procedures performed Allergies: Cefdinir, Doxycycline, Codeine, Penicillins, Simvastatin, and Other    Assessment / Plan:     Visit Diagnoses: Rheumatoid arthritis of multiple sites with negative rheumatoid factor [facility]): Patient has been experiencing increased pain and stiffness in both hands and both feet.  She also has occasional discomfort in both shoulder joints especially at night when laying on her sides.  She has been holding Rinvoq and Arava since September 2023 after being diagnosed with malignant neoplasm of right breast.  She underwent a mastectomy bilaterally on 11/28/21. The final surgical drains were removed on 01/13/2022 and the incisions have completely healed. She will not require chemotherapy or radiation therapy at this time. She will be undergoing reconstructive surgery starting on 04/30/2022.  In the meantime she requested to restart on her rheumatoid arthritis medications to better manage her symptoms.  Recommended receiving written clearance prior to resuming Rinvoq.  Discussed that she will need to hold Rinvoq at least 2 weeks prior to her next surgery.  We will hold off on resuming arava at this time since rinvoq will be faster acting to alleviate her symptoms.  Once she receives written clearance from her surgical oncologist she will notify us and resume rinvoq as prescribed.  She will notify us if her symptoms persist or worsen.  She will follow up in 3 months or sooner if needed.   High risk medication use - Rinvoq 15 mg 1 tablet by mouth daily.  Rinvoq added on at the end of July 2022-has been on hold since September 2023. Requested written clearance prior to the patient resuming rinvoq as prescribed.  Patient will continue to hold [place].  CBC and CMP updated on 01/09/22.  Results were reviewed today in  the office.  CBC and CMP will be rechecked today prior to restarting Rinvoq.  She will require updated lab work in March and every 3 months to monitor for drug toxicity.- Plan: CBC with Differential/Platelet, COMPLETE METABOLIC PANEL WITH GFR TB gold negative on 08/26/21.  She has not had any recent or recurrent infections.  Discussed the importance of holding rinvoq if she develops signs or symptoms of an infection and to resume once the infection has completely cleared.  She is up-to-date with annual flu shot, Shingrix vaccines, and pneumonia vaccine. Counseled on the increase risk of venous thrombosis. Counseled about FDA black box warning of MACE (major adverse CV events including cardiovascular death, myocardial infarction, and stroke).  History of breast cancer: Under care of [facility].  Final pathology demonstrated: Right breast with DCIS, all margins free of tumor, adequate margins and 0/5 lymph nodes positive for carcinoma. Left breast with no malignancy.  Underwent bilateral mastectomy on 11/28/21.  Final drain removed 01/13/22. Incision has completely healed. Patient did not require chemotherapy or radiation therapy.  Scheduled to start reconstructive surgery on 05/08/22.  Patient has been off of Rinvoq and arava since September 2023. Since the patient has been more symptomatic with rheumatoid arthritis she would like to resume Rinvoq. She plans on reaching out to her surgeon for clearance prior to starting rinvoq. Requested written clearance prior to resuming  rinvoq.  She is also aware she will need to hold rinvoq at least 2 weeks prior to surgery in February 2024.    Osteopenia of multiple sites - DEXA updated on 09/09/2021: The BMD measured at Forearm Radius 33% is 0.741 g/cm2 with a T-score of -1.5. Previous DEXA on 08/12/19: The BMD measured at Forearm Radius 33% is 0.751 g/cm2 with a T-score of -1.4. She is taking a vitamin D supplement.   Status post total shoulder  replacement, right: She has intermittent discomfort especially at night when laying on her right side.   Primary osteoarthritis of both hands: She has PIP and DIP thickening consistent with osteoarthritis of both hands.   Primary osteoarthritis of both feet: Chronic pain. Followed by Dr. Amalia Hailey.  She has been experiencing increased pain in both feet.  She has noticed intermittent joint swelling.  Plan to resume rinvoq if she has written clearance from her surgical oncologist.   Pes cavus of both feet: She continues to have chronic pain in both feet.   Other medical conditions are listed as follows:    Anxiety and depression  Hyperparathyroidism, primary ([place])  Essential hypertension: BP was 158/103 today in the office.  BP was rechecked today.    Hashimoto's thyroiditis  History of miscarriage  History of gastroesophageal reflux (GERD)  History of hyperlipidemia  Hx of migraines  Former smoker  Orders: Orders Placed This Encounter  Procedures   CBC with Differential/Platelet   COMPLETE METABOLIC PANEL WITH GFR   No orders of the defined types were placed in this encounter.   Follow-Up Instructions: Return in about 3 months (around 05/20/2022) for Rheumatoid arthritis, Osteoarthritis.   Ofilia Neas, PA-C  Note - This record has been created using Dragon software.  Chart creation errors have been sought, but may not always  have been located. Such creation errors do not reflect on  the standard of medical care.

## 2022-02-11 DIAGNOSIS — F4321 Adjustment disorder with depressed mood: Secondary | ICD-10-CM | POA: Diagnosis not present

## 2022-02-12 NOTE — Progress Notes (Deleted)
No charge. 

## 2022-02-17 ENCOUNTER — Other Ambulatory Visit: Payer: Self-pay | Admitting: Family Medicine

## 2022-02-17 DIAGNOSIS — G4733 Obstructive sleep apnea (adult) (pediatric): Secondary | ICD-10-CM | POA: Diagnosis not present

## 2022-02-18 ENCOUNTER — Ambulatory Visit: Payer: BLUE CROSS/BLUE SHIELD | Attending: Physician Assistant | Admitting: Physician Assistant

## 2022-02-18 ENCOUNTER — Telehealth: Payer: Self-pay

## 2022-02-18 ENCOUNTER — Encounter: Payer: Self-pay | Admitting: Physician Assistant

## 2022-02-18 VITALS — BP 158/103 | HR 87 | Resp 16 | Ht 65.0 in | Wt 206.0 lb

## 2022-02-18 DIAGNOSIS — I10 Essential (primary) hypertension: Secondary | ICD-10-CM

## 2022-02-18 DIAGNOSIS — Z79899 Other long term (current) drug therapy: Secondary | ICD-10-CM

## 2022-02-18 DIAGNOSIS — M8589 Other specified disorders of bone density and structure, multiple sites: Secondary | ICD-10-CM | POA: Diagnosis not present

## 2022-02-18 DIAGNOSIS — E210 Primary hyperparathyroidism: Secondary | ICD-10-CM

## 2022-02-18 DIAGNOSIS — Z8639 Personal history of other endocrine, nutritional and metabolic disease: Secondary | ICD-10-CM

## 2022-02-18 DIAGNOSIS — F419 Anxiety disorder, unspecified: Secondary | ICD-10-CM

## 2022-02-18 DIAGNOSIS — F32A Depression, unspecified: Secondary | ICD-10-CM

## 2022-02-18 DIAGNOSIS — E063 Autoimmune thyroiditis: Secondary | ICD-10-CM

## 2022-02-18 DIAGNOSIS — M19042 Primary osteoarthritis, left hand: Secondary | ICD-10-CM

## 2022-02-18 DIAGNOSIS — M19041 Primary osteoarthritis, right hand: Secondary | ICD-10-CM

## 2022-02-18 DIAGNOSIS — M19072 Primary osteoarthritis, left ankle and foot: Secondary | ICD-10-CM

## 2022-02-18 DIAGNOSIS — M19071 Primary osteoarthritis, right ankle and foot: Secondary | ICD-10-CM

## 2022-02-18 DIAGNOSIS — M0609 Rheumatoid arthritis without rheumatoid factor, multiple sites: Secondary | ICD-10-CM

## 2022-02-18 DIAGNOSIS — Q6671 Congenital pes cavus, right foot: Secondary | ICD-10-CM

## 2022-02-18 DIAGNOSIS — Z96611 Presence of right artificial shoulder joint: Secondary | ICD-10-CM | POA: Diagnosis not present

## 2022-02-18 DIAGNOSIS — Z8759 Personal history of other complications of pregnancy, childbirth and the puerperium: Secondary | ICD-10-CM

## 2022-02-18 DIAGNOSIS — Z853 Personal history of malignant neoplasm of breast: Secondary | ICD-10-CM

## 2022-02-18 DIAGNOSIS — Q6672 Congenital pes cavus, left foot: Secondary | ICD-10-CM

## 2022-02-18 DIAGNOSIS — Z8669 Personal history of other diseases of the nervous system and sense organs: Secondary | ICD-10-CM

## 2022-02-18 DIAGNOSIS — Z8719 Personal history of other diseases of the digestive system: Secondary | ICD-10-CM

## 2022-02-18 DIAGNOSIS — Z87891 Personal history of nicotine dependence: Secondary | ICD-10-CM

## 2022-02-18 NOTE — Patient Instructions (Signed)
Standing Labs We placed an order today for your standing lab work.   Please have your standing labs drawn in March and every 3 months  Please have your labs drawn 2 weeks prior to your appointment so that the provider can discuss your lab results at your appointment.  Please note that you may see your imaging and lab results in MyChart before we have reviewed them. We will contact you once all results are reviewed. Please allow our office up to 72 hours to thoroughly review all of the results before contacting the office for clarification of your results.  Lab hours are:   Monday through Thursday from 8:00 am -12:30 pm and 1:00 pm-5:00 pm and Friday from 8:00 am-12:00 pm.  Please be advised, all patients with office appointments requiring lab work will take precedent over walk-in lab work.   Labs are drawn by [facility]. Please bring your co-pay at the time of your lab draw.  You may receive a bill from [facility] for your lab work.  Please note if you are on Hydroxychloroquine and and an order has been placed for a Hydroxychloroquine level, you will need to have it drawn 4 hours or more after your last dose.  If you wish to have your labs drawn at another location, please call the office 24 hours in advance so we can fax the orders.  The office is located at [address], [address], [place], [place] [address] No appointment is necessary.    If you have any questions regarding directions or hours of operation,  please call [phone].   As a reminder, please drink plenty of water prior to coming for your lab work. Thanks!  

## 2022-02-18 NOTE — Telephone Encounter (Signed)
Called patient and advised per Hazel Sams, PA-C, to receive written clearance prior to resume rinvoq sent to our office. Patient verbalized understanding.

## 2022-02-19 DIAGNOSIS — F4321 Adjustment disorder with depressed mood: Secondary | ICD-10-CM | POA: Diagnosis not present

## 2022-02-19 LAB — COMPLETE METABOLIC PANEL WITH GFR
AG Ratio: 1.5 (calc) (ref 1.0–2.5)
ALT: 18 U/L (ref 6–29)
AST: 15 U/L (ref 10–35)
Albumin: 4.1 g/dL (ref 3.6–5.1)
Alkaline phosphatase (APISO): 97 U/L (ref 37–153)
BUN: 14 mg/dL (ref 7–25)
CO2: 30 mmol/L (ref 20–32)
Calcium: 9.9 mg/dL (ref 8.6–10.4)
Chloride: 103 mmol/L (ref 98–110)
Creat: 0.63 mg/dL (ref 0.50–1.05)
Globulin: 2.8 g/dL (calc) (ref 1.9–3.7)
Glucose, Bld: 117 mg/dL — ABNORMAL HIGH (ref 65–99)
Potassium: 5.4 mmol/L — ABNORMAL HIGH (ref 3.5–5.3)
Sodium: 141 mmol/L (ref 135–146)
Total Bilirubin: 0.3 mg/dL (ref 0.2–1.2)
Total Protein: 6.9 g/dL (ref 6.1–8.1)
eGFR: 101 mL/min/{1.73_m2} (ref >=60)

## 2022-02-19 LAB — CBC WITH DIFFERENTIAL/PLATELET
Absolute Monocytes: 541 cells/uL (ref 200–950)
Basophils Absolute: 42 cells/uL (ref 0–200)
Basophils Relative: 0.8 %
Eosinophils Absolute: 62 cells/uL (ref 15–500)
Eosinophils Relative: 1.2 %
HCT: 38.5 % (ref 35.0–45.0)
Hemoglobin: 12.5 g/dL (ref 11.7–15.5)
Lymphs Abs: 2158 cells/uL (ref 850–3900)
MCH: 28.3 pg (ref 27.0–33.0)
MCHC: 32.5 g/dL (ref 32.0–36.0)
MCV: 87.3 fL (ref 80.0–100.0)
MPV: 10.5 fL (ref 7.5–12.5)
Monocytes Relative: 10.4 %
Neutro Abs: 2397 cells/uL (ref 1500–7800)
Neutrophils Relative %: 46.1 %
Platelets: 302 10*3/uL (ref 140–400)
RBC: 4.41 10*6/uL (ref 3.80–5.10)
RDW: 13.5 % (ref 11.0–15.0)
Total Lymphocyte: 41.5 %
WBC: 5.2 10*3/uL (ref 3.8–10.8)

## 2022-02-19 NOTE — Progress Notes (Signed)
CBC WNL.  Glucose is 117.  Potassium is borderline elevated-5.4. rest of CMP WNL.  Patient may need to have BMP rechecked with PCP to follow mild hyperkalemia.  Please notify the patient.

## 2022-03-15 ENCOUNTER — Other Ambulatory Visit: Payer: Self-pay | Admitting: Family Medicine

## 2022-03-15 DIAGNOSIS — F32A Depression, unspecified: Secondary | ICD-10-CM

## 2022-03-17 ENCOUNTER — Encounter: Payer: BLUE CROSS/BLUE SHIELD | Admitting: Family Medicine

## 2022-03-26 ENCOUNTER — Telehealth: Payer: Self-pay | Admitting: Pharmacist

## 2022-03-26 NOTE — Telephone Encounter (Signed)
Received PA renewal for Rinvoq. Pt is currently holding s/p bilateral masectomy but plan is likely to resume Rinvoq once cleared. Preauthorizing Rinvoq to prevent delays in restarting  Submitted a Prior Authorization request to [facility] for RINVOQ via CoverMyMeds. Will update once we receive a response.  Key: Clotilde Dieter, PharmD, MPH, BCPS, CPP Clinical Pharmacist (Rheumatology and Pulmonology)

## 2022-03-27 ENCOUNTER — Other Ambulatory Visit: Payer: Self-pay | Admitting: Family Medicine

## 2022-04-01 DIAGNOSIS — F4321 Adjustment disorder with depressed mood: Secondary | ICD-10-CM | POA: Diagnosis not present

## 2022-04-08 ENCOUNTER — Ambulatory Visit: Payer: BLUE CROSS/BLUE SHIELD | Admitting: Family Medicine

## 2022-04-08 ENCOUNTER — Other Ambulatory Visit: Payer: Self-pay

## 2022-04-08 ENCOUNTER — Emergency Department (HOSPITAL_BASED_OUTPATIENT_CLINIC_OR_DEPARTMENT_OTHER): Payer: BLUE CROSS/BLUE SHIELD

## 2022-04-08 ENCOUNTER — Encounter: Payer: BLUE CROSS/BLUE SHIELD | Admitting: Family Medicine

## 2022-04-08 ENCOUNTER — Emergency Department (HOSPITAL_BASED_OUTPATIENT_CLINIC_OR_DEPARTMENT_OTHER)
Admission: EM | Admit: 2022-04-08 | Discharge: 2022-04-08 | Disposition: A | Discharge status: Home / Self Care | Payer: BLUE CROSS/BLUE SHIELD | Attending: Emergency Medicine | Admitting: Emergency Medicine

## 2022-04-08 ENCOUNTER — Encounter (HOSPITAL_BASED_OUTPATIENT_CLINIC_OR_DEPARTMENT_OTHER): Payer: Self-pay

## 2022-04-08 DIAGNOSIS — M25561 Pain in right knee: Secondary | ICD-10-CM | POA: Diagnosis not present

## 2022-04-08 DIAGNOSIS — E063 Autoimmune thyroiditis: Secondary | ICD-10-CM | POA: Insufficient documentation

## 2022-04-08 DIAGNOSIS — I10 Essential (primary) hypertension: Secondary | ICD-10-CM | POA: Insufficient documentation

## 2022-04-08 DIAGNOSIS — Z7982 Long term (current) use of aspirin: Secondary | ICD-10-CM | POA: Diagnosis not present

## 2022-04-08 DIAGNOSIS — Z79899 Other long term (current) drug therapy: Secondary | ICD-10-CM | POA: Insufficient documentation

## 2022-04-08 DIAGNOSIS — M5416 Radiculopathy, lumbar region: Secondary | ICD-10-CM | POA: Insufficient documentation

## 2022-04-08 DIAGNOSIS — M545 Low back pain, unspecified: Secondary | ICD-10-CM | POA: Diagnosis not present

## 2022-04-08 DIAGNOSIS — Z853 Personal history of malignant neoplasm of breast: Secondary | ICD-10-CM | POA: Insufficient documentation

## 2022-04-08 DIAGNOSIS — Z87891 Personal history of nicotine dependence: Secondary | ICD-10-CM | POA: Diagnosis not present

## 2022-04-08 DIAGNOSIS — M25551 Pain in right hip: Secondary | ICD-10-CM | POA: Diagnosis not present

## 2022-04-08 MED ORDER — METHOCARBAMOL 500 MG PO TABS
500.0000 mg | ORAL_TABLET | Freq: Once | ORAL | Status: AC
  Administered 2022-04-08: 500 mg via ORAL
  Filled 2022-04-08: qty 1

## 2022-04-08 MED ORDER — KETOROLAC TROMETHAMINE 60 MG/2ML IM SOLN
15.0000 mg | Freq: Once | INTRAMUSCULAR | Status: AC
  Administered 2022-04-08: 15 mg via INTRAMUSCULAR
  Filled 2022-04-08: qty 2

## 2022-04-08 MED ORDER — KETOROLAC TROMETHAMINE 10 MG PO TABS: 10.0000 mg | ORAL_TABLET | Freq: Four times a day (QID) | ORAL | 0 refills | Status: AC | PRN

## 2022-04-08 MED ORDER — METHOCARBAMOL 500 MG PO TABS: 500.0000 mg | ORAL_TABLET | Freq: Two times a day (BID) | ORAL | 0 refills | Status: DC

## 2022-04-08 MED ORDER — ACETAMINOPHEN 500 MG PO TABS
1000.0000 mg | ORAL_TABLET | Freq: Once | ORAL | Status: AC
  Administered 2022-04-08: 1000 mg via ORAL
  Filled 2022-04-08: qty 2

## 2022-04-08 NOTE — ED Triage Notes (Signed)
Pt presents with complaints of hip pain. Reports stepping into a hole last Monday. Right hip pain at time, now traveling down to right knee. Taking nsaids without relief

## 2022-04-08 NOTE — ED Provider Notes (Signed)
[facility] Provider Note   CSN: [id number] Arrival date & time: 04/08/22  9147     History  Chief Complaint  Patient presents with   Hip Pain    Sandra [name] is a 62 y.o. female with cancer status post bilateral mastectomy, hypertension, primary hyperparathyroidism, overactive bladder, GERD, HLD, insomnia, rheumatoid arthritis, OSA, Hashimoto's thyroiditis, obesity who presents with hip pain.  Patient reports that on Saturday she stepped into a hole in the yard, did not twist or fall but rather just had a sudden unexpected axial load on the right lower extremity which resulted in acute onset significant posterior right hip pain.  She notes that she has tried Tylenol, ibuprofen, Aleve, Mobic, and lidocaine patches and heat and nothing has helped and instead of improving her pain is actually worsening.  Endorses pain beginning in the right hip posteriorly radiating down her right buttock and states that she has not developed pain in the medial inferior right knee.  No history of similar, no history of lower back hip or knee surgery.  Does have RA but does not currently feel like she is in a flare.  Denies any fever/chills, numbness tingling, swelling, saddle anesthesia, changes to bowel or bladder habits, or any other symptoms.  Patient had to visit her PCP this morning and had an appointment at 8 AM but was 10 minutes late and was told that they would not see her so came here.  Otherwise patient had bilateral mastectomy on 11/28/2021 at [facility] for breast cancer and is scheduled for tissue expander surgery on 05/08/2022.   Hip Pain       Home Medications Prior to Admission medications   Medication Sig Start Date End Date Taking? Authorizing Provider  ketorolac (TORADOL) 10 MG tablet Take 1 tablet (10 mg total) by mouth every 6 (six) hours as needed for up to 4 days for moderate pain or severe pain. 04/08/22 04/12/22 Yes Audley Hose,  MD  methocarbamol (ROBAXIN) 500 MG tablet Take 1 tablet (500 mg total) by mouth 2 (two) times daily. 04/08/22  Yes Audley Hose, MD  ALPRAZolam Duanne Moron) 0.25 MG tablet Take 1 tablet (0.25 mg total) by mouth daily as needed. 09/03/21   [name], MD  aspirin 325 MG tablet Take 325 mg by mouth daily.    [provider]  atorvastatin (LIPITOR) 80 MG tablet TAKE 1 TABLET BY MOUTH DAILY. 09/04/21   [name], MD  azithromycin (ZITHROMAX Z-PAK) 250 MG tablet As directed Patient not taking: Reported on 02/18/2022 01/09/22   Carollee Herter, Alferd Apa, DO  Cholecalciferol (VITAMIN D3) 125 MCG (5000 UT) CAPS Take by mouth 2 (two) times a week. Patient not taking: Reported on 02/18/2022    [provider]  cholecalciferol (VITAMIN D3) 25 MCG (1000 UNIT) tablet Take 2,000 Units by mouth once a week.    [provider]  Cholecalciferol 100 MCG (4000 UT) CAPS Vitamin D3 100 mcg (4,000 unit) capsule  Take by oral route. Patient not taking: Reported on 11/19/2021    [provider]  Co-Enzyme Q-10 30 MG CAPS Take 100 mg by mouth at bedtime. Patient not taking: Reported on 02/18/2022    [provider]  escitalopram (LEXAPRO) 10 MG tablet TAKE 1 TABLET BY MOUTH EVERY DAY 03/17/22   [name], MD  fluconazole (DIFLUCAN) 150 MG tablet 1 po x1, may repeat in 3 days prn Patient not taking: Reported on 02/18/2022  01/09/22   Ann Held, DO  furosemide (LASIX) 20 MG tablet Take 1 tablet (20 mg total) by mouth daily. 02/17/22   [name], MD  Ginger, Zingiber officinalis, (GINGER PO) Take by mouth.    [provider]  irbesartan (AVAPRO) 150 MG tablet TAKE 1 TABLET BY MOUTH EVERY DAY 09/16/21   [name], MD  KRILL OIL PO krill oil    [provider]  lactobacillus acidophilus (BACID) TABS tablet Take 2 tablets by mouth 3 (three) times daily.    [provider]  leflunomide (ARAVA) 20 MG tablet TAKE 1 TABLET BY MOUTH  EVERY DAY Patient not taking: Reported on 02/18/2022 12/20/21   Ofilia Neas, PA-C  levothyroxine (SYNTHROID) 75 MCG tablet Take 1 tablet (75 mcg total) by mouth daily before breakfast. 03/27/22   [name], MD  MAGNESIUM PO Take by mouth at bedtime.    [provider]  MELATONIN PO Take 9 mg by mouth at bedtime. Patient not taking: Reported on 02/18/2022    [provider]  metoprolol succinate (TOPROL-XL) 25 MG 24 hr tablet TAKE 1 TABLET (25 MG TOTAL) BY MOUTH DAILY. 12/18/21   [name], MD  Omega-3 Fatty Acids (OMEGA 3 PO) Take by mouth.    [provider]  omeprazole (PRILOSEC) 40 MG capsule TAKE 1 CAPSULE BY MOUTH EVERY DAY 03/27/22   [name], MD  RINVOQ 15 MG TB24 TAKE 1 TABLET BY MOUTH EVERY DAY Patient not taking: Reported on 11/19/2021 08/28/21   Ofilia Neas, PA-C  TART CHERRY PO Take by mouth. Patient not taking: Reported on 11/19/2021    [provider]  Trospium Chloride 60 MG CP24 trospium ER 60 mg capsule,extended release 24 hr  TAKE 1 CAPSULE BY MOUTH EVERY DAY    [provider]  TURMERIC PO Take 1 tablet by mouth 2 (two) times daily.    [provider]  verapamil (CALAN-SR) 120 MG CR tablet TAKE 1 TABLET BY MOUTH EVERYDAY AT BEDTIME 06/17/21   [name], MD      Allergies    Cefdinir, Doxycycline, Codeine, Penicillins, Simvastatin, and Other    Review of Systems   Review of Systems Review of systems Negative for f/c, LEE/erythema.  A 10 point review of systems was performed and is negative unless otherwise reported in HPI.  Physical Exam Updated Vital Signs BP (!) 160/99   Pulse 91   Temp 99 F (37.2 C)   Resp 18   Ht '5\' 5"'$  (1.651 m)   Wt 90.7 kg   LMP 04/01/2011   SpO2 99%   BMI 33.28 kg/m  Physical Exam General: Normal appearing female, lying in bed.  HEENT: Sclera anicteric, MMM, trachea midline.  Cardiology: RRR, no murmurs/rubs/gallops. BL radial and DP pulses equal  bilaterally.  Resp: Normal respiratory rate and effort. CTAB, no wheezes, rhonchi, crackles.  Abd: Soft, non-tender, non-distended. No rebound tenderness or guarding.  GU: Deferred. MSK: Pain with passive range of motion of right hip and knee.  Full range of motion.  Minimal hip or knee effusion, no erythema.  Compartments are soft and nontender.  Intact DP/PT pulses and intact sensation.  No external deformities or signs of trauma.  Tenderness palpation in the right sacroiliac region and the right buttock.  Tenderness palpation over the tibial plateau on the right. Negative anterior/posterior drawer. No calf tenderness or swelling.  Skin: warm, dry. No rashes or lesions. Back: No midline  lumbar tenderness or stepoffs Neuro: A&Ox4, CNs II-XII grossly intact. MAEs. Sensation grossly intact.  Psych: Normal mood and affect.   ED Results / Procedures / Treatments   Labs (all labs ordered are listed, but only abnormal results are displayed) Labs Reviewed - No data to display  EKG None  Radiology DG Knee 2 Views Right  Result Date: 04/08/2022 CLINICAL DATA:  Injury last week, right knee pain lateral aspect EXAM: RIGHT KNEE - 1-2 VIEW COMPARISON:  07/26/2019 FINDINGS: Frontal and lateral views of the right knee are obtained. No fracture, subluxation, or dislocation. There is mild 3 compartmental osteoarthritis greatest in the medial compartment. Slight progression since prior study. No joint effusion. Soft tissues are unremarkable. IMPRESSION: 1. Mild 3 compartmental osteoarthritis, greatest medially. Slight progression since prior exam. 2. No acute bony abnormality. Electronically Signed   By: Randa Ngo M.D.   On: 04/08/2022 10:24   DG Lumbar Spine 2-3 Views  Result Date: 04/08/2022 CLINICAL DATA:  Right low back pain radiates to the buttock region. EXAM: LUMBAR SPINE - 2-3 VIEW COMPARISON:  No comparison studies available. FINDINGS: No evidence for fracture. Loss of disc height most  pronounced at L4-5 and L5-S1 although mild loss of disc height noted L2-3 and L3-4. SI joints unremarkable. IMPRESSION: Degenerative disc disease, most pronounced at L4-5 and L5-S1. Electronically Signed   By: Misty Stanley M.D.   On: 04/08/2022 10:11   DG Hip Unilat W or Wo Pelvis 2-3 Views Right  Result Date: 04/08/2022 CLINICAL DATA:  Right hip pain EXAM: DG HIP (WITH OR WITHOUT PELVIS) 3V RIGHT COMPARISON:  None Available. FINDINGS: There is no evidence of hip fracture or dislocation. No joint space narrowing or notable spurring. Ossicle at the right lateral acetabulum. Generalized osteopenia. Lower lumbar spine degeneration. IMPRESSION: No acute finding or degenerative hip narrowing. Electronically Signed   By: Jorje Guild M.D.   On: 04/08/2022 10:10    Procedures Procedures    Medications Ordered in ED Medications  ketorolac (TORADOL) injection 15 mg (15 mg Intramuscular Given 04/08/22 0916)  acetaminophen (TYLENOL) tablet 1,000 mg (1,000 mg Oral Given 04/08/22 0915)  methocarbamol (ROBAXIN) tablet 500 mg (500 mg Oral Given 04/08/22 0915)    ED Course/ Medical Decision Making/ A&P                          Medical Decision Making Amount and/or Complexity of Data Reviewed Radiology: ordered. Decision-making details documented in ED Course.  Risk OTC drugs. Prescription drug management.    This patient presents to the ED for concern of R hip pain/knee pain, this involves an extensive number of treatment options, and is a complaint that carries with it a high risk of complications and morbidity.  However patient is afebrile, very well-appearing, nontoxic-appearing and otherwise states she feels in her normal state of health.  MDM:    Consider hip fracture though patient has been ambulatory, sacroiliac joint pain/sacroiliitis, hip strain/sprain, right knee sprain/strain given probable altered ambulation as result of hip pain, tibial plateau fracture given pain over palpation of  tibial plateau, or other fracture/dislocation.  Will obtain x-rays to evaluate.  When patient describes her hip pain and she actually points to her right lower sacral area.  Also consider sciatica or herniated disc given pain radiating down the posterior buttock from the lower back region.  Patient has no midline midline tenderness palpation though does have a history of malignancy so we will consider lumbar or  sacral fracture as well will obtain x-rays.  She has no numbness tingling, weakness, saddle anesthesia, changes to bowel or bladder habits to raise concern for cauda equina or conus medullaris syndrome.  Compartments are soft nontender, no concern for compartment syndrome and she is NVI.  No concern for DVT on exam.  No effusion, erythema, or fevers chills to indicate septic arthritis.  Patient states that she does not feel as though she is currently in a rheumatoid arthritis flare and she has no swelling redness in any of her other joints.  Clinical Course as of 04/08/22 1138  Tue Apr 08, 2022  1018 DG Hip Unilat W or Wo Pelvis 2-3 Views Right FINDINGS: There is no evidence of hip fracture or dislocation. No joint space narrowing or notable spurring. Ossicle at the right lateral acetabulum. Generalized osteopenia. Lower lumbar spine degeneration.  IMPRESSION: No acute finding or degenerative hip narrowing.   [HN]  1047 DG Lumbar Spine 2-3 Views FINDINGS: No evidence for fracture. Loss of disc height most pronounced at L4-5 and L5-S1 although mild loss of disc height noted L2-3 and L3-4. SI joints unremarkable.  IMPRESSION: Degenerative disc disease, most pronounced at L4-5 and L5-S1.   [HN]  1047 DG Knee 2 Views Right FINDINGS: Frontal and lateral views of the right knee are obtained. No fracture, subluxation, or dislocation. There is mild 3 compartmental osteoarthritis greatest in the medial compartment. Slight progression since prior study. No joint effusion. Soft tissues  are unremarkable.  IMPRESSION: 1. Mild 3 compartmental osteoarthritis, greatest medially. Slight progression since prior exam. 2. No acute bony abnormality.   [HN]  1129 Patient had relief from robaxin, tylenol, toradol. She feels improved and states she will call her PCP today. Will also give info for orthopedist to follow to call and make an appointment.  [HN]    Clinical Course User Index [HN] Audley Hose, MD    Imaging Studies ordered: I ordered imaging studies including x-rays listed above I independently visualized and interpreted imaging. I agree with the radiologist interpretation  Additional history obtained from chart review.    Reevaluation: After the interventions noted above, I reevaluated the patient and found that they have :improved  Social Determinants of Health: Patient lives independently   Disposition: Patient with musculoskeletal pain concern for hip strain/sprain, lower back strain/sprain, or possibly sciatica or sacroiliac component.  Knee pain with negative anterior posterior drawer and no deformities.  X-rays negative for acute fracture.  Patient request prescription for Toradol as the other NSAIDs have not worked for her.  Will prescribe Toradol 10 mg every 6 hours as needed for 4 days starting tomorrow.  Will also prescribe 4 mg twice daily Robaxin x 10 days.  Patient is instructed to follow-up with her PCP or on orthopedist and is given information to call the clinic today.  She states she will call.  She is given discharge instructions and return precautions, all questions answered to patient satisfaction.  Co morbidities that complicate the patient evaluation  Past Medical History:  Diagnosis Date   Allergic rhinitis 05/31/2012   Breast cancer ([place])    right breast, dx 10/2021.   Chest pain    PT SAW CARDIOLOGIST DR. Burt Knack - STRESS ECHO DONE NEGATIVE - NO PROBLEM SINCE   Chicken pox as achild   Cough 11/29/2012   RESOLVED - IT WAS CAUSED BY  LISINOPRIL   Elevated LFTs 05/31/2012   Eustachian tube dysfunction 07/27/2017   GERD (gastroesophageal reflux disease)  H/O tobacco use, presenting hazards to health 11/29/2012   Smoked 1 1/2 ppd for 20 years quit in roughtly 2007   Headache 11/25/2015   Hematuria 04/09/2014   Hoarseness 02/04/2012   RESOLVED - THOUGHT TO HAVE BEEN ALLERY RELATED   Hyperglycemia 04/06/2014   Hyperlipidemia    Hyperparathyroidism ([place])    Hypertension    Obesity, unspecified 05/31/2012   Ocular migraine    Overactive bladder 05/31/2012   Preventative health care 02/04/2012   PTSD (post-traumatic stress disorder)    Rapid heart beat    PT FEELS PROB RELATED TO HYPER PARATHYROIDISM   Rheumatoid arthritis ([facility])    SUI (stress urinary incontinence, female) 02/04/2012   Sees Dr Perlie Gold and they have discussed a bladder tack but so far she declines     Medicines Meds ordered this encounter  Medications   ketorolac (TORADOL) injection 15 mg   acetaminophen (TYLENOL) tablet 1,000 mg   methocarbamol (ROBAXIN) tablet 500 mg   ketorolac (TORADOL) 10 MG tablet    Sig: Take 1 tablet (10 mg total) by mouth every 6 (six) hours as needed for up to 4 days for moderate pain or severe pain.    Dispense:  16 tablet    Refill:  0   methocarbamol (ROBAXIN) 500 MG tablet    Sig: Take 1 tablet (500 mg total) by mouth 2 (two) times daily.    Dispense:  20 tablet    Refill:  0    I have reviewed the patients home medicines and have made adjustments as needed  Problem List / ED Course: Problem List Items Addressed This Visit   None Visit Diagnoses     Lumbar back pain with radiculopathy affecting right lower extremity    -  Primary   Relevant Medications   ketorolac (TORADOL) injection 15 mg (Completed)   acetaminophen (TYLENOL) tablet 1,000 mg (Completed)   methocarbamol (ROBAXIN) tablet 500 mg (Completed)   ketorolac (TORADOL) 10 MG tablet   methocarbamol (ROBAXIN) 500 MG tablet   Acute pain of  right knee                       This note was created using dictation software, which may contain spelling or grammatical errors.    Audley Hose, MD 04/08/22 [phone]

## 2022-04-08 NOTE — ED Notes (Signed)
Reviewed discharge instructions, follow up and medication. States understanding Pt pain decreased. Able to ambulate upon discharge

## 2022-04-08 NOTE — Discharge Instructions (Addendum)
Thank you for coming to [facility] Emergency Department. You were seen for right hip and right lower back pain. We did an exam, labs, and imaging, and these showed no acute findings.  Your right lower back pain possibly represents radiculopathy, sciatica, or lower back or hip sprain/strain.  Please take 1000 mg of Tylenol every 8 hours.  We have prescribed 10 mg of Toradol every 6 hours as needed for pain for no longer than 5 days.  Please do not take any more today as we have given it to you in your muscle.  We have also prescribed Robaxin 500 mg twice per day as needed for pain.  You can also continue to use ice or heat.  Please do not take other NSAIDs such as Aleve, Mobic, naproxen, or ibuprofen while you are taking Toradol.  Please follow up with your primary care provider within 1 week.  We have also provided you with the number for Dr. Marcelino Scot for orthopedics you can call this week to make an appointment. You can call him at [phone].  Do not hesitate to return to the ED or call 911 if you experience: -Worsening symptoms -Chest pain, shortness of breath -Cold, numb, blue leg or foot -Lightheadedness, passing out -Fevers/chills -Anything else that concerns you

## 2022-04-09 DIAGNOSIS — M25561 Pain in right knee: Secondary | ICD-10-CM | POA: Diagnosis not present

## 2022-04-09 DIAGNOSIS — M5416 Radiculopathy, lumbar region: Secondary | ICD-10-CM | POA: Diagnosis not present

## 2022-04-09 DIAGNOSIS — M5136 Other intervertebral disc degeneration, lumbar region: Secondary | ICD-10-CM | POA: Diagnosis not present

## 2022-04-10 DIAGNOSIS — F4321 Adjustment disorder with depressed mood: Secondary | ICD-10-CM | POA: Diagnosis not present

## 2022-04-11 DIAGNOSIS — M25561 Pain in right knee: Secondary | ICD-10-CM | POA: Diagnosis not present

## 2022-04-11 DIAGNOSIS — M6259 Muscle wasting and atrophy, not elsewhere classified, multiple sites: Secondary | ICD-10-CM | POA: Diagnosis not present

## 2022-04-11 DIAGNOSIS — R269 Unspecified abnormalities of gait and mobility: Secondary | ICD-10-CM | POA: Diagnosis not present

## 2022-04-11 DIAGNOSIS — M25551 Pain in right hip: Secondary | ICD-10-CM | POA: Diagnosis not present

## 2022-04-14 DIAGNOSIS — M25561 Pain in right knee: Secondary | ICD-10-CM | POA: Diagnosis not present

## 2022-04-14 DIAGNOSIS — R269 Unspecified abnormalities of gait and mobility: Secondary | ICD-10-CM | POA: Diagnosis not present

## 2022-04-14 DIAGNOSIS — M6259 Muscle wasting and atrophy, not elsewhere classified, multiple sites: Secondary | ICD-10-CM | POA: Diagnosis not present

## 2022-04-14 DIAGNOSIS — M25551 Pain in right hip: Secondary | ICD-10-CM | POA: Diagnosis not present

## 2022-04-16 DIAGNOSIS — M25561 Pain in right knee: Secondary | ICD-10-CM | POA: Diagnosis not present

## 2022-04-16 DIAGNOSIS — M25551 Pain in right hip: Secondary | ICD-10-CM | POA: Diagnosis not present

## 2022-04-16 DIAGNOSIS — R269 Unspecified abnormalities of gait and mobility: Secondary | ICD-10-CM | POA: Diagnosis not present

## 2022-04-16 DIAGNOSIS — M6259 Muscle wasting and atrophy, not elsewhere classified, multiple sites: Secondary | ICD-10-CM | POA: Diagnosis not present

## 2022-04-21 DIAGNOSIS — R269 Unspecified abnormalities of gait and mobility: Secondary | ICD-10-CM | POA: Diagnosis not present

## 2022-04-21 DIAGNOSIS — M25561 Pain in right knee: Secondary | ICD-10-CM | POA: Diagnosis not present

## 2022-04-21 DIAGNOSIS — M6259 Muscle wasting and atrophy, not elsewhere classified, multiple sites: Secondary | ICD-10-CM | POA: Diagnosis not present

## 2022-04-21 DIAGNOSIS — M25551 Pain in right hip: Secondary | ICD-10-CM | POA: Diagnosis not present

## 2022-04-23 DIAGNOSIS — F4321 Adjustment disorder with depressed mood: Secondary | ICD-10-CM | POA: Diagnosis not present

## 2022-04-23 DIAGNOSIS — M5416 Radiculopathy, lumbar region: Secondary | ICD-10-CM | POA: Diagnosis not present

## 2022-04-23 DIAGNOSIS — M5136 Other intervertebral disc degeneration, lumbar region: Secondary | ICD-10-CM | POA: Diagnosis not present

## 2022-04-23 DIAGNOSIS — M25561 Pain in right knee: Secondary | ICD-10-CM | POA: Diagnosis not present

## 2022-04-23 DIAGNOSIS — M4316 Spondylolisthesis, lumbar region: Secondary | ICD-10-CM | POA: Diagnosis not present

## 2022-04-24 DIAGNOSIS — M25551 Pain in right hip: Secondary | ICD-10-CM | POA: Diagnosis not present

## 2022-04-24 DIAGNOSIS — M25561 Pain in right knee: Secondary | ICD-10-CM | POA: Diagnosis not present

## 2022-04-24 DIAGNOSIS — M6259 Muscle wasting and atrophy, not elsewhere classified, multiple sites: Secondary | ICD-10-CM | POA: Diagnosis not present

## 2022-04-24 DIAGNOSIS — R269 Unspecified abnormalities of gait and mobility: Secondary | ICD-10-CM | POA: Diagnosis not present

## 2022-04-28 DIAGNOSIS — M5416 Radiculopathy, lumbar region: Secondary | ICD-10-CM | POA: Diagnosis not present

## 2022-04-28 DIAGNOSIS — M5136 Other intervertebral disc degeneration, lumbar region: Secondary | ICD-10-CM | POA: Diagnosis not present

## 2022-04-28 DIAGNOSIS — M25551 Pain in right hip: Secondary | ICD-10-CM | POA: Diagnosis not present

## 2022-04-28 DIAGNOSIS — M25561 Pain in right knee: Secondary | ICD-10-CM | POA: Diagnosis not present

## 2022-04-28 DIAGNOSIS — M6259 Muscle wasting and atrophy, not elsewhere classified, multiple sites: Secondary | ICD-10-CM | POA: Diagnosis not present

## 2022-04-28 DIAGNOSIS — R269 Unspecified abnormalities of gait and mobility: Secondary | ICD-10-CM | POA: Diagnosis not present

## 2022-05-07 ENCOUNTER — Ambulatory Visit: Payer: BLUE CROSS/BLUE SHIELD | Admitting: Podiatry

## 2022-05-07 DIAGNOSIS — M8448XD Pathological fracture, other site, subsequent encounter for fracture with routine healing: Secondary | ICD-10-CM | POA: Diagnosis not present

## 2022-05-07 DIAGNOSIS — M5416 Radiculopathy, lumbar region: Secondary | ICD-10-CM | POA: Diagnosis not present

## 2022-05-12 DIAGNOSIS — S83271D Complex tear of lateral meniscus, current injury, right knee, subsequent encounter: Secondary | ICD-10-CM | POA: Diagnosis not present

## 2022-05-15 NOTE — Telephone Encounter (Signed)
Received notification from [facility] regarding a prior authorization for [facility]. Authorization has been APPROVED from 03/26/2022 through 03/25/2023.  Knox Saliva, PharmD, MPH, BCPS, CPP Clinical Pharmacist (Rheumatology and Pulmonology)

## 2022-05-20 DIAGNOSIS — F4321 Adjustment disorder with depressed mood: Secondary | ICD-10-CM | POA: Diagnosis not present

## 2022-05-20 NOTE — Progress Notes (Unsigned)
Office Visit Note  Patient: Sandra [name]             Date of Birth: Dec 10, 1960           MRN: [id number]             PCP: [name], MD Referring: [name], MD Visit Date: 06/03/2022 Occupation: @GUAROCC @  Subjective:  Discuss restarting rinvoq   History of Present Illness: Sandra [name] is a 62 y.o. female with history of seronegative rheumatoid arthritis and osteoarthritis.  Patient reports that she has been off of her invoke and her Reyvow since July 2023.  She underwent a bilateral mastectomy in September 2023.  She has had to postpone reconstruction/expander placement.  In fall 2023 she had delayed healing involving a seroma.  She was last seen in the office at 02/18/2022 at which time she was considering restarting written Juluis Rainier since she was now scheduled to have the expanders placed until February 2024.  Unfortunately January 2024 she was walking her puppy and stepped in a hole.  She ended up sustaining a sacral insufficiency fracture as well as tearing her lateral meniscus in the right knee.  She ended up postponing the expanse replacement in February 2024 due to the concern of having to lay on the operating table while trying to heal the sacral fracture.  She is now scheduled for the expander placement on 08/14/22. She has been experiencing increased generalized aching and joint stiffness.  She continues to have feet injections every 3 months performed by her podiatrist Dr. Amalia Hailey.  She is having increased discomfort and stiffness in the left shoulder joint currently.  For management of her lower back pain secondary to the sacral fracture she has been taking Voltaren 75 mg 1 tablet daily, methocarbamol 500 mg 1 tablet daily, and hydrocodone as needed for pain relief.  She would like to discuss restarting on rinvoq temporarily since her surgery is not scheduled until 08/14/2022.  She plans on holding off on starting arava at this time.      Activities of Daily Living:   Patient reports morning stiffness for 3 hours.   Patient Denies nocturnal pain.  Difficulty dressing/grooming: Denies Difficulty climbing stairs: Denies Difficulty getting out of chair: Denies Difficulty using hands for taps, buttons, cutlery, and/or writing: Reports  Review of Systems  Constitutional:  Positive for fatigue.  HENT:  Positive for mouth dryness. Negative for mouth sores.   Eyes:  Negative for dryness.  Respiratory:  Negative for shortness of breath.   Cardiovascular:  Negative for chest pain and palpitations.  Gastrointestinal:  Positive for constipation. Negative for blood in stool and diarrhea.  Endocrine: Positive for increased urination.  Genitourinary:  Negative for involuntary urination.  Musculoskeletal:  Positive for joint pain, gait problem, joint pain, joint swelling, myalgias, muscle weakness, morning stiffness, muscle tenderness and myalgias.  Skin:  Negative for color change, rash, hair loss and sensitivity to sunlight.  Allergic/Immunologic: Negative for susceptible to infections.  Neurological:  Positive for weakness. Negative for dizziness, numbness and headaches.  Hematological:  Negative for swollen glands.  Psychiatric/Behavioral:  Positive for depressed mood and sleep disturbance. The patient is nervous/anxious.     PMFS History:  Patient Active Problem List   Diagnosis Date Noted   Hashimoto's thyroiditis 04/08/2022   Bronchitis 01/09/2022   H/O mastectomy, bilateral 11/29/2021   Malignant neoplasm of right breast in female, estrogen receptor positive ([place]) 10/14/2021   Insomnia 09/04/2021   Osteopenia 09/04/2021  Pulmonary nodule 03/29/2021   Rheumatoid arthritis ([place]) 12/02/2019   Anxiety and depression 08/01/2019   Arthralgia 05/04/2019   OSA (obstructive sleep apnea) 05/03/2019   Thyroiditis 12/20/2018   Muscle cramp 12/20/2018   Eustachian tube dysfunction 07/27/2017   Headache 11/25/2015   Acute bacterial sinusitis 03/26/2015    Hyperglycemia 04/06/2014   Hyperparathyroidism, primary ([place]) 07/12/2013   Hypercalcemia 12/16/2012   H/O tobacco use, presenting hazards to health 11/29/2012   Overactive bladder 05/31/2012   Elevated LFTs 05/31/2012   Obesity (BMI 30-39.9) 05/31/2012   Allergic rhinitis 05/31/2012   Colon cancer screening 02/04/2012   SUI (stress urinary incontinence, female) 02/04/2012   GERD (gastroesophageal reflux disease)    Rotator cuff syndrome of right shoulder 06/11/2011   Hypertension 01/03/2011   Hyperlipidemia 01/03/2011   KNEE PAIN, BILATERAL 09/28/2008   ANSERINE BURSITIS 09/28/2008   CAVUS DEFORMITY OF FOOT, ACQUIRED 09/28/2008    Past Medical History:  Diagnosis Date   Allergic rhinitis 05/31/2012   Breast cancer ([place])    right breast, dx 10/2021.   Chest pain    PT SAW CARDIOLOGIST DR. Burt Knack - STRESS ECHO DONE NEGATIVE - NO PROBLEM SINCE   Chicken pox as achild   Cough 11/29/2012   RESOLVED - IT WAS CAUSED BY LISINOPRIL   Elevated LFTs 05/31/2012   Eustachian tube dysfunction 07/27/2017   GERD (gastroesophageal reflux disease)    H/O tobacco use, presenting hazards to health 11/29/2012   Smoked 1 1/2 ppd for 20 years quit in roughtly 2007   Headache 11/25/2015   Hematuria 04/09/2014   Hoarseness 02/04/2012   RESOLVED - THOUGHT TO HAVE BEEN ALLERY RELATED   Hyperglycemia 04/06/2014   Hyperlipidemia    Hyperparathyroidism ([place])    Hypertension    Obesity, unspecified 05/31/2012   Ocular migraine    Overactive bladder 05/31/2012   Preventative health care 02/04/2012   PTSD (post-traumatic stress disorder)    Rapid heart beat    PT FEELS PROB RELATED TO HYPER PARATHYROIDISM   Rheumatoid arthritis ([place])    SUI (stress urinary incontinence, female) 02/04/2012   Sees Dr Perlie Gold and they have discussed a bladder tack but so far she declines    Family History  Problem Relation Age of Onset   Hypertension Mother 60       alive   Renal cancer Mother        renal    Hyperlipidemia Mother    Osteoporosis Mother        humerus    Macular degeneration Mother    Tremor Mother    Parkinson's disease Mother    Hypertension Father 16       alive   Atrial fibrillation Father    Colon cancer Father        colon   Hyperlipidemia Father    Stroke Father    Alzheimer's disease Father    Hypertension Sister    Hypertension Brother 49       alive   Tremor Brother    Asthma Son    Eczema Son    Healthy Son    Heart disease Paternal Aunt    Heart disease Paternal Uncle    Lung cancer Maternal Grandfather        lung/ smoker   Emphysema Maternal Grandfather    Heart disease Paternal Grandmother    Other Paternal Grandfather        black lung   Heart disease Paternal Grandfather    Past Surgical History:  Procedure  Laterality Date   BREAST EXCISIONAL BIOPSY Left    Age 2 Fibroadenoma   BREAST SURGERY     left breast excision of adenoma   btl     COLONOSCOPY  07/2020   FOOT SURGERY     MASTECTOMY Bilateral 11/28/2021   PARATHYROIDECTOMY Right 08/12/2013   Procedure: right inferior frozen section PARATHYROIDECTOMY;  Surgeon: Earnstine Regal, MD;  Location: [facility];  Service: General;  Laterality: Right;   PELVIC LAPAROSCOPY  03/2004   TOTAL SHOULDER REPLACEMENT Right 01/2019   TUBAL LIGATION     WISDOM TOOTH EXTRACTION  30 yrs ago   Social History   Social History Narrative   Not on file   Immunization History  Administered Date(s) Administered   Hepatitis A, Adult 09/04/2016, 03/27/2017   Influenza Split 12/09/2011   Influenza,inj,Quad PF,6+ Mos 11/29/2012, 11/01/2013, 11/20/2015, 12/29/2017, 04/03/2019, 12/13/2019, 03/28/2021   PFIZER(Purple Top)SARS-COV-2 Vaccination 05/20/2019, 06/15/2019, 01/11/2020   Pneumococcal Conjugate-13 03/28/2021   Pneumococcal Polysaccharide-23 08/01/2019   Tdap 06/07/2015   Zoster Recombinat (Shingrix) 08/01/2019, 10/03/2020     Objective: Vital Signs: BP 111/76 (BP Location: Left Arm, Patient Position:  Sitting, Cuff Size: Normal)   Pulse 82   Resp 15   Ht 5\' 5"  (1.651 m)   Wt 206 lb (93.4 kg)   LMP 04/01/2011   BMI 34.28 kg/m    Physical Exam Vitals and nursing note reviewed.  Constitutional:      Appearance: She is well-developed.  HENT:     Head: Normocephalic and atraumatic.  Eyes:     Conjunctiva/sclera: Conjunctivae normal.  Cardiovascular:     Rate and Rhythm: Normal rate and regular rhythm.     Heart sounds: Normal heart sounds.  Pulmonary:     Effort: Pulmonary effort is normal.     Breath sounds: Normal breath sounds.  Abdominal:     General: Bowel sounds are normal.     Palpations: Abdomen is soft.  Musculoskeletal:     Cervical back: Normal range of motion.  Lymphadenopathy:     Cervical: No cervical adenopathy.  Skin:    General: Skin is warm and dry.     Capillary Refill: Capillary refill takes less than 2 seconds.  Neurological:     Mental Status: She is alert and oriented to person, place, and time.  Psychiatric:        Behavior: Behavior normal.      Musculoskeletal Exam: C-spine, thoracic spine, lumbar spine have good range of motion. Right shoulder replacement has good ROM with no discomfort.  Left shoulder has good ROM.  Elbow joints, wrist joints, MCPs, PIPs, DIPs have good range of motion with no synovitis.  PIP and DIP thickening consistent with osteoarthritis of both hands.  Hip joints have good range of motion with no groin pain.  Knee joints have good range of motion with no warmth or effusion.  Ankle joints have good range of motion with no tenderness or joint swelling.  PIP and DIP thickening consistent with osteoarthritis of both feet. Pes cavus bilaterally.   CDAI Exam: CDAI Score: -- Patient Global: 4 mm; Provider Global: 4 mm Swollen: --; Tender: -- Joint Exam 06/03/2022   No joint exam has been documented for this visit   There is currently no information documented on the homunculus. Go to the Rheumatology activity and complete the  homunculus joint exam.  Investigation: No additional findings.  Imaging: No results found.  Recent Labs: Lab Results  Component Value Date   WBC 5.2  02/18/2022   HGB 12.5 02/18/2022   PLT 302 02/18/2022   NA 141 02/18/2022   K 5.4 (H) 02/18/2022   CL 103 02/18/2022   CO2 30 02/18/2022   GLUCOSE 117 (H) 02/18/2022   BUN 14 02/18/2022   CREATININE 0.63 02/18/2022   BILITOT 0.3 02/18/2022   ALKPHOS 81 01/09/2022   AST 15 02/18/2022   ALT 18 02/18/2022   PROT 6.9 02/18/2022   ALBUMIN 3.9 01/09/2022   CALCIUM 9.9 02/18/2022   GFRAA 117 08/31/2020   QFTBGOLDPLUS NEGATIVE 08/26/2021    Speciality Comments: Orencia stoped 10/04/20 Rinvoq started 10/11/20  Procedures:  No procedures performed Allergies: Cefdinir, Doxycycline, Codeine, Penicillins, Simvastatin, and Other       Assessment / Plan:     Visit Diagnoses: Rheumatoid arthritis of multiple sites with negative rheumatoid factor [facility]): Patient presents today with increased arthralgias and joint stiffness.  She is having increased discomfort in her right shoulder replacement and has ongoing pain and stiffness in both hands and both feet.  She has been off of both Rinvoq and Arava since July 2023 prior to undergoing a bilateral mastectomy.  She is scheduled to have expander placement on 08/14/2022.  She would like to resume her invoke temporarily to alleviate her current symptoms.  She plans on continuing to hold Verdel until after she has undergone the procedure.  Discussed that she will need to hold Rinvoq prior to surgery and to be cleared by her surgeon after surgery prior to resuming therapy.  She is advised to notify us if she develops any new or worsening symptoms.  She will follow-up in the office in 3 months or sooner if needed.  High risk medication use - Rinvoq 15 mg 1 tablet by mouth daily.  Rinvoq added on at the end of July 2022-has been on hold since July 2023.  TB gold negative on 08/26/21.  Future order for TB Gold  placed today to be drawn with next lab work. CBC and CMP updated on 02/18/22.  Orders for CBC and CMP released today.  Her next lab work will be due in June and every 3 months.  Standing orders for CBC and CMP placed today. Direct LDL 179 on 09/03/2021.  Future order for lipid panel remains in place.  She is planning on following up with her PCP today.  She remains on Lipitor as prescribed. Plans to continue to hold arava-not necessary for combination therapy at this time.  Discussed the importance of holding rinvoq if she develops signs or symptoms of an infection and to resume once the infection has completely cleared.   Counseled on the increase risk of venous thrombosis. Counseled about FDA black box warning of MACE (major adverse CV events including cardiovascular death, myocardial infarction, and stroke).   Patient was encouraged to hold rinvoq at least 1-2 weeks prior to surgery on 08/14/22.  She will require clearance by her surgeon prior to resuming rinvoq during the postoperative period.  - Plan: CBC with Differential/Platelet, COMPLETE METABOLIC PANEL WITH GFR, QuantiFERON-TB Gold Plus, CBC with Differential/Platelet, COMPLETE METABOLIC PANEL WITH GFR  Screening for tuberculosis - Future order for TB gold placed today.  Plan: QuantiFERON-TB Gold Plus  Vitamin D deficiency - She is taking vitamin D 5000 units twice weekly.  Vitamin D will be checked today.  Plan: VITAMIN D 25 Hydroxy (Vit-D Deficiency, Fractures)  Osteopenia of multiple sites - DEXA updated on 09/09/21: The BMD measured at Forearm Radius 33% is 0.741 g/cm2 with a T-score  of -1.5. Previous DEXA on 08/12/19:Radius 33% is 0.751 g/cm2 with a T-score of -1.4. Bilateral sacral alar fracture after stepping in a hole in January 2024.   History of recurrent prednisone tapers and frequent cortisone injections. History of hyperparathyroidism--history of right parathyroidectomy 2015. History of Hashimoto's thyroiditis--remains on  Synthroid. Previous smoker. History of GERD--not a good candidate for oral bisphosphonate therapy.  History of breast cancer--plan avoiding Tymlos/Forteo.  Plan to avoid Evenity given risk for major cardiovascular events while on Rinvoq. Different treatment options were discussed today in detail.  Indications, contraindications, and potential side effects of IV Reclast were discussed today.  All questions were addressed.  Plan on applying for IV Reclast through her insurance and once approved she will be set up for the first infusion. She is taking vitamin D 5000 units twice weekly.  Vitamin D level will be checked today.  CBC and CMP were also updated today. Due to update DEXA in July 2025.   Status post total shoulder replacement, right: She presents today with increased discomfort in the right shoulder.    Primary osteoarthritis of both hands: PIP and DIP thickening consistent with osteoarthritis of both hands.  She continues to have stiffness in both hands.  No synovitis noted.   Primary osteoarthritis of both feet - Dr. Jennefer Bravo has injections every 3 months.   History of breast cancer - Underwent bilateral mastectomy on 11/28/21.Final drain removed 01/13/22.did not require chemotherapy or radiation therapy. Reconstructive surgery--expander placement scheduled on 08/14/22.   Pes cavus of both feet: Under the care of Dr. Amalia Hailey.  Other medical conditions are listed as follows:  Anxiety and depression  Hyperparathyroidism, primary ([place])  Essential hypertension: Blood pressure was 111/76 today in the office.  Hashimoto's thyroiditis  History of miscarriage  History of gastroesophageal reflux (GERD)  History of hyperlipidemia  Hx of migraines  Former smoker    Orders: Orders Placed This Encounter  Procedures   CBC with Differential/Platelet   COMPLETE METABOLIC PANEL WITH GFR   QuantiFERON-TB Gold Plus   CBC with Differential/Platelet   COMPLETE METABOLIC PANEL WITH GFR    VITAMIN D 25 Hydroxy (Vit-D Deficiency, Fractures)   No orders of the defined types were placed in this encounter.  Follow-Up Instructions: Return in about 3 months (around 09/03/2022) for Rheumatoid arthritis.   Ofilia Neas, PA-C  Note - This record has been created using Dragon software.  Chart creation errors have been sought, but may not always  have been located. Such creation errors do not reflect on  the standard of medical care.

## 2022-05-29 ENCOUNTER — Encounter: Payer: BLUE CROSS/BLUE SHIELD | Admitting: Family Medicine

## 2022-06-03 ENCOUNTER — Other Ambulatory Visit (HOSPITAL_COMMUNITY): Payer: Self-pay

## 2022-06-03 ENCOUNTER — Ambulatory Visit: Payer: BLUE CROSS/BLUE SHIELD | Attending: Physician Assistant | Admitting: Physician Assistant

## 2022-06-03 ENCOUNTER — Telehealth: Payer: Self-pay | Admitting: Pharmacist

## 2022-06-03 ENCOUNTER — Encounter: Payer: Self-pay | Admitting: Physician Assistant

## 2022-06-03 VITALS — BP 111/76 | HR 82 | Resp 15 | Ht 65.0 in | Wt 206.0 lb

## 2022-06-03 DIAGNOSIS — I10 Essential (primary) hypertension: Secondary | ICD-10-CM

## 2022-06-03 DIAGNOSIS — Z96611 Presence of right artificial shoulder joint: Secondary | ICD-10-CM | POA: Diagnosis not present

## 2022-06-03 DIAGNOSIS — F419 Anxiety disorder, unspecified: Secondary | ICD-10-CM

## 2022-06-03 DIAGNOSIS — Z79899 Other long term (current) drug therapy: Secondary | ICD-10-CM | POA: Diagnosis not present

## 2022-06-03 DIAGNOSIS — E559 Vitamin D deficiency, unspecified: Secondary | ICD-10-CM | POA: Diagnosis not present

## 2022-06-03 DIAGNOSIS — M8589 Other specified disorders of bone density and structure, multiple sites: Secondary | ICD-10-CM | POA: Diagnosis not present

## 2022-06-03 DIAGNOSIS — M0609 Rheumatoid arthritis without rheumatoid factor, multiple sites: Secondary | ICD-10-CM

## 2022-06-03 DIAGNOSIS — M19072 Primary osteoarthritis, left ankle and foot: Secondary | ICD-10-CM

## 2022-06-03 DIAGNOSIS — Q6671 Congenital pes cavus, right foot: Secondary | ICD-10-CM

## 2022-06-03 DIAGNOSIS — Q6672 Congenital pes cavus, left foot: Secondary | ICD-10-CM

## 2022-06-03 DIAGNOSIS — M19042 Primary osteoarthritis, left hand: Secondary | ICD-10-CM

## 2022-06-03 DIAGNOSIS — Z8639 Personal history of other endocrine, nutritional and metabolic disease: Secondary | ICD-10-CM

## 2022-06-03 DIAGNOSIS — F32A Depression, unspecified: Secondary | ICD-10-CM

## 2022-06-03 DIAGNOSIS — Z8719 Personal history of other diseases of the digestive system: Secondary | ICD-10-CM

## 2022-06-03 DIAGNOSIS — M19041 Primary osteoarthritis, right hand: Secondary | ICD-10-CM

## 2022-06-03 DIAGNOSIS — Z8669 Personal history of other diseases of the nervous system and sense organs: Secondary | ICD-10-CM

## 2022-06-03 DIAGNOSIS — E063 Autoimmune thyroiditis: Secondary | ICD-10-CM

## 2022-06-03 DIAGNOSIS — Z87891 Personal history of nicotine dependence: Secondary | ICD-10-CM

## 2022-06-03 DIAGNOSIS — M19071 Primary osteoarthritis, right ankle and foot: Secondary | ICD-10-CM

## 2022-06-03 DIAGNOSIS — Z8759 Personal history of other complications of pregnancy, childbirth and the puerperium: Secondary | ICD-10-CM

## 2022-06-03 DIAGNOSIS — E210 Primary hyperparathyroidism: Secondary | ICD-10-CM

## 2022-06-03 DIAGNOSIS — Z853 Personal history of malignant neoplasm of breast: Secondary | ICD-10-CM

## 2022-06-03 DIAGNOSIS — Z111 Encounter for screening for respiratory tuberculosis: Secondary | ICD-10-CM

## 2022-06-03 NOTE — Progress Notes (Signed)
Pharmacy Note  Subjective: Patient presents today to the [facility] for follow up office visit.   Patient seen by pharmacist for counseling on Reclast therapy for osteoporosis. She has active GERD so will avoid oral bisphosphonates  Objective: /T-score: -1.5 (09/09/21) Lab Results  Component Value Date   VD25OH 57.88 09/03/2021   CMP     Component Value Date/Time   NA 141 02/18/2022 0834   K 5.4 (H) 02/18/2022 0834   CL 103 02/18/2022 0834   CO2 30 02/18/2022 0834   GLUCOSE 117 (H) 02/18/2022 0834   BUN 14 02/18/2022 0834   CREATININE 0.63 02/18/2022 0834   CALCIUM 9.9 02/18/2022 0834   PROT 6.9 02/18/2022 0834   ALBUMIN 3.9 01/09/2022 1427   AST 15 02/18/2022 0834   ALT 18 02/18/2022 0834   ALKPHOS 81 01/09/2022 1427   BILITOT 0.3 02/18/2022 0834   GFRNONAA >60 11/01/2020 1137   GFRNONAA 101 08/31/2020 0858   GFRAA 117 08/31/2020 0858     Assessment and Plan:  Counseled patient that Reclast is an IV bisphosphonate that reduces bone turnover by inhibiting osteoclasts that chew up bone.  Counseled patient on purpose, proper use, and adverse effects of Reclast.  Reviewed adverse events of Reclast including risk of nausea & diarrhea, headache, and muscle & bone pain.  Reviewed rare adverse effect of osteonecrosis of the jaw and advised patient to alert her dentist that she is on Reclast prior to any major dental work.  Patient confirms she does not have any major dental work scheduled at this time.  She has active GERD so will avoid oral bisphosphonates   Reviewed importance of taking calcium and vitamin D with bisphosphonate therapy. Recommended daily amount of calcium is 1200mg  and vitamin D 450 700 0596 units.  Advised calcium is better obtained through diet vs supplement.  Counseled about risk of excess calcium supplementation such a kidney stones and increased risk of heart disease.    Provided patient with medication education material and answered all questions.  Patient agrees to trial of Reclast IV infusion 5 mg yearly at this time.   Unable to provide exact co-pay amount as it is billed through medical not pharmacy benefit. 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  Patient verbalized understanding.  Knox Saliva, PharmD, MPH, BCPS, CPP Clinical Pharmacist (Rheumatology and Pulmonology)

## 2022-06-03 NOTE — Patient Instructions (Signed)
Standing Labs We placed an order today for your standing lab work.   Please have your standing labs drawn in June and every 3 months   Please have your labs drawn 2 weeks prior to your appointment so that the provider can discuss your lab results at your appointment, if possible.  Please note that you may see your imaging and lab results in MyChart before we have reviewed them. We will contact you once all results are reviewed. Please allow our office up to 72 hours to thoroughly review all of the results before contacting the office for clarification of your results.  WALK-IN LAB HOURS  Monday through Thursday from 8:00 am -12:30 pm and 1:00 pm-5:00 pm and Friday from 8:00 am-12:00 pm.  Patients with office visits requiring labs will be seen before walk-in labs.  You may encounter longer than normal wait times. Please allow additional time. Wait times may be shorter on  Monday and Thursday afternoons.  We do not book appointments for walk-in labs. We appreciate your patience and understanding with our staff.   Labs are drawn by [facility]. Please bring your co-pay at the time of your lab draw.  You may receive a bill from [facility] for your lab work.  Please note if you are on Hydroxychloroquine and and an order has been placed for a Hydroxychloroquine level,  you will need to have it drawn 4 hours or more after your last dose.  If you wish to have your labs drawn at another location, please call the office 24 hours in advance so we can fax the orders.  The office is located at [address], [address], [place], [place] [address]   If you have any questions regarding directions or hours of operation,  please call [phone].   As a reminder, please drink plenty of water prior to coming for your lab work. Thanks!  

## 2022-06-03 NOTE — Progress Notes (Signed)
CBC WNL

## 2022-06-03 NOTE — Telephone Encounter (Signed)
Patient restarting Rinvoq. Rinvoq already approved through insurance but new prescription will need to be sent to [facility] once labs are updated today.  Knox Saliva, PharmD, MPH, BCPS, CPP Clinical Pharmacist (Rheumatology and Pulmonology)

## 2022-06-04 ENCOUNTER — Telehealth: Payer: Self-pay | Admitting: Pharmacy Technician

## 2022-06-04 ENCOUNTER — Other Ambulatory Visit: Payer: Self-pay | Admitting: Pharmacist

## 2022-06-04 ENCOUNTER — Other Ambulatory Visit: Payer: Self-pay | Admitting: *Deleted

## 2022-06-04 DIAGNOSIS — M8448XD Pathological fracture, other site, subsequent encounter for fracture with routine healing: Secondary | ICD-10-CM | POA: Diagnosis not present

## 2022-06-04 DIAGNOSIS — Z79899 Other long term (current) drug therapy: Secondary | ICD-10-CM

## 2022-06-04 DIAGNOSIS — M810 Age-related osteoporosis without current pathological fracture: Secondary | ICD-10-CM

## 2022-06-04 DIAGNOSIS — M0609 Rheumatoid arthritis without rheumatoid factor, multiple sites: Secondary | ICD-10-CM

## 2022-06-04 LAB — COMPLETE METABOLIC PANEL WITH GFR
AG Ratio: 1.4 (calc) (ref 1.0–2.5)
ALT: 16 U/L (ref 6–29)
AST: 16 U/L (ref 10–35)
Albumin: 4.1 g/dL (ref 3.6–5.1)
Alkaline phosphatase (APISO): 214 U/L — ABNORMAL HIGH (ref 37–153)
BUN: 19 mg/dL (ref 7–25)
CO2: 30 mmol/L (ref 20–32)
Calcium: 9.7 mg/dL (ref 8.6–10.4)
Chloride: 101 mmol/L (ref 98–110)
Creat: 0.69 mg/dL (ref 0.50–1.05)
Globulin: 2.9 g/dL (calc) (ref 1.9–3.7)
Glucose, Bld: 79 mg/dL (ref 65–99)
Potassium: 4.6 mmol/L (ref 3.5–5.3)
Sodium: 137 mmol/L (ref 135–146)
Total Bilirubin: 0.3 mg/dL (ref 0.2–1.2)
Total Protein: 7 g/dL (ref 6.1–8.1)
eGFR: 99 mL/min/{1.73_m2} (ref >=60)

## 2022-06-04 LAB — CBC WITH DIFFERENTIAL/PLATELET
Absolute Monocytes: 523 cells/uL (ref 200–950)
Basophils Absolute: 27 cells/uL (ref 0–200)
Basophils Relative: 0.4 %
Eosinophils Absolute: 87 cells/uL (ref 15–500)
Eosinophils Relative: 1.3 %
HCT: 37.1 % (ref 35.0–45.0)
Hemoglobin: 12 g/dL (ref 11.7–15.5)
Lymphs Abs: 2157 cells/uL (ref 850–3900)
MCH: 27.9 pg (ref 27.0–33.0)
MCHC: 32.3 g/dL (ref 32.0–36.0)
MCV: 86.3 fL (ref 80.0–100.0)
MPV: 10.1 fL (ref 7.5–12.5)
Monocytes Relative: 7.8 %
Neutro Abs: 3906 cells/uL (ref 1500–7800)
Neutrophils Relative %: 58.3 %
Platelets: 323 10*3/uL (ref 140–400)
RBC: 4.3 10*6/uL (ref 3.80–5.10)
RDW: 15 % (ref 11.0–15.0)
Total Lymphocyte: 32.2 %
WBC: 6.7 10*3/uL (ref 3.8–10.8)

## 2022-06-04 LAB — VITAMIN D 25 HYDROXY (VIT D DEFICIENCY, FRACTURES): Vit D, 25-Hydroxy: 49 ng/mL (ref 30–100)

## 2022-06-04 NOTE — Progress Notes (Signed)
Alk phos is elevated-patient had a fall>>>sacral fracture recently.  Rest of CMP WNL-LFTs WNL. We will continue to monitor closely. Recheck CBC and CMP in 3 months Vitamin D WNL.

## 2022-06-04 NOTE — Assessment & Plan Note (Signed)
Patient encouraged to maintain heart healthy diet, regular exercise, adequate sleep. Consider daily probiotics. Take medications as prescribed Labs ordered and reviewed Given and reviewed copy of ACP documents from [employer] [place] and encouraged to complete and return  MM July 2023 Pap 2022 Colonoscopy 2016

## 2022-06-04 NOTE — Assessment & Plan Note (Signed)
Encourage heart healthy diet such as MIND or DASH diet, increase exercise, avoid trans fats, simple carbohydrates and processed foods, consider a krill or fish or flaxseed oil cap daily.  °

## 2022-06-04 NOTE — Telephone Encounter (Signed)
Linda Hedges note:  Auth Submission: NO AUTH NEEDED Site of care: Site of care: [facility] Payer: BCBS Medication & CPT/J Code(s) submitted: Reclast (Zolendronic acid) [id number] Route of submission (phone, fax, portal):  Phone # Fax # Auth type: Buy/Bill Units/visits requested: 1 Reference number:  Approval from: 06/04/22 to 03/10/23

## 2022-06-04 NOTE — Telephone Encounter (Signed)
Order for Reclast has been placed for Sandra  [name] Saliva, PharmD, MPH, BCPS, CPP Clinical Pharmacist (Rheumatology and Pulmonology)

## 2022-06-04 NOTE — Assessment & Plan Note (Signed)
Well controlled, no changes to meds. Encouraged heart healthy diet such as the DASH diet and exercise as tolerated.  °

## 2022-06-04 NOTE — Assessment & Plan Note (Signed)
hgba1c acceptable, minimize simple carbs. Increase exercise as tolerated.  

## 2022-06-04 NOTE — Assessment & Plan Note (Signed)
Hydrate and monitor 

## 2022-06-04 NOTE — Progress Notes (Addendum)
Patient is new start to Reclast infusions. Referral to [facility] placed today. Diagnosis: age-related osteoporosis  Dose: 5mg  IV every 12 months  Last Clinic Visit: 06/03/22 Next Clinic Visit: 09/04/22  Labs: CBC, CMP, Vitamin D stable on 06/03/22  Orders placed for Reclast IV x 1 dose along with premedication of acetaminophen and diphenhydramine to be administered 30 minutes before medication infusion.  Knox Saliva, PharmD, MPH, BCPS, CPP Clinical Pharmacist (Rheumatology and Pulmonology)

## 2022-06-05 ENCOUNTER — Ambulatory Visit (INDEPENDENT_AMBULATORY_CARE_PROVIDER_SITE_OTHER): Payer: BLUE CROSS/BLUE SHIELD | Admitting: Family Medicine

## 2022-06-05 VITALS — BP 132/78 | HR 96 | Temp 97.8°F | Resp 16 | Ht 65.0 in | Wt 205.8 lb

## 2022-06-05 DIAGNOSIS — E210 Primary hyperparathyroidism: Secondary | ICD-10-CM

## 2022-06-05 DIAGNOSIS — M810 Age-related osteoporosis without current pathological fracture: Secondary | ICD-10-CM

## 2022-06-05 DIAGNOSIS — Z0000 Encounter for general adult medical examination without abnormal findings: Secondary | ICD-10-CM | POA: Diagnosis not present

## 2022-06-05 DIAGNOSIS — E782 Mixed hyperlipidemia: Secondary | ICD-10-CM | POA: Diagnosis not present

## 2022-06-05 DIAGNOSIS — R252 Cramp and spasm: Secondary | ICD-10-CM | POA: Diagnosis not present

## 2022-06-05 DIAGNOSIS — R739 Hyperglycemia, unspecified: Secondary | ICD-10-CM | POA: Diagnosis not present

## 2022-06-05 DIAGNOSIS — I10 Essential (primary) hypertension: Secondary | ICD-10-CM | POA: Diagnosis not present

## 2022-06-05 NOTE — Patient Instructions (Addendum)
PURE or MIND or Mediterranean 4000 steps, 8000 steps Hydration 60-80 ounces noncaffeine, nonalcohol 6-8 hours of sleep   L Tryptophan and/or Magnesium Glycinate 200-400 mg at bedtime  [employer] website has a good sleep preparation Wegovy or Zepbound or Saxenda for weight loss   Preventive Care 9-61 Years Old, Female Preventive care refers to lifestyle choices and visits with your health care provider that can promote health and wellness. Preventive care visits are also called wellness exams. What can I expect for my preventive care visit? Counseling Your health care provider may ask you questions about your: Medical history, including: Past medical problems. Family medical history. Pregnancy history. Current health, including: Menstrual cycle. Method of birth control. Emotional well-being. Home life and relationship well-being. Sexual activity and sexual health. Lifestyle, including: Alcohol, nicotine or tobacco, and drug use. Access to firearms. Diet, exercise, and sleep habits. Work and work Statistician. Sunscreen use. Safety issues such as seatbelt and bike helmet use. Physical exam Your health care provider will check your: Height and weight. These may be used to calculate your BMI (body mass index). BMI is a measurement that tells if you are at a healthy weight. Waist circumference. This measures the distance around your waistline. This measurement also tells if you are at a healthy weight and may help predict your risk of certain diseases, such as type 2 diabetes and high blood pressure. Heart rate and blood pressure. Body temperature. Skin for abnormal spots. What immunizations do I need?  Vaccines are usually given at various ages, according to a schedule. Your health care provider will recommend vaccines for you based on your age, medical history, and lifestyle or other factors, such as travel or where you work. What tests do I need? Screening Your health  care provider may recommend screening tests for certain conditions. This may include: Lipid and cholesterol levels. Diabetes screening. This is done by checking your blood sugar (glucose) after you have not eaten for a while (fasting). Pelvic exam and Pap test. Hepatitis B test. Hepatitis C test. HIV (human immunodeficiency virus) test. STI (sexually transmitted infection) testing, if you are at risk. Lung cancer screening. Colorectal cancer screening. Mammogram. Talk with your health care provider about when you should start having regular mammograms. This may depend on whether you have a family history of breast cancer. BRCA-related cancer screening. This may be done if you have a family history of breast, ovarian, tubal, or peritoneal cancers. Bone density scan. This is done to screen for osteoporosis. Talk with your health care provider about your test results, treatment options, and if necessary, the need for more tests. Follow these instructions at home: Eating and drinking  Eat a diet that includes fresh fruits and vegetables, whole grains, lean protein, and low-fat dairy products. Take vitamin and mineral supplements as recommended by your health care provider. Do not drink alcohol if: Your health care provider tells you not to drink. You are pregnant, may be pregnant, or are planning to become pregnant. If you drink alcohol: Limit how much you have to 0-1 drink a day. Know how much alcohol is in your drink. In the [place] one drink equals one 12 oz bottle of beer (355 mL), one 5 oz glass of wine (148 mL), or one 1 oz glass of hard liquor (44 mL). Lifestyle Brush your teeth every morning and night with fluoride toothpaste. Floss one time each day. Exercise for at least 30 minutes 5 or more days each week. Do not use any products that  contain nicotine or tobacco. These products include cigarettes, chewing tobacco, and vaping devices, such as e-cigarettes. If you need help quitting,  ask your health care provider. Do not use drugs. If you are sexually active, practice safe sex. Use a condom or other form of protection to prevent STIs. If you do not wish to become pregnant, use a form of birth control. If you plan to become pregnant, see your health care provider for a prepregnancy visit. Take aspirin only as told by your health care provider. Make sure that you understand how much to take and what form to take. Work with your health care provider to find out whether it is safe and beneficial for you to take aspirin daily. Find healthy ways to manage stress, such as: Meditation, yoga, or listening to music. Journaling. Talking to a trusted person. Spending time with friends and family. Minimize exposure to UV radiation to reduce your risk of skin cancer. Safety Always wear your seat belt while driving or riding in a vehicle. Do not drive: If you have been drinking alcohol. Do not ride with someone who has been drinking. When you are tired or distracted. While texting. If you have been using any mind-altering substances or drugs. Wear a helmet and other protective equipment during sports activities. If you have firearms in your house, make sure you follow all gun safety procedures. Seek help if you have been physically or sexually abused. What's next? Visit your health care provider once a year for an annual wellness visit. Ask your health care provider how often you should have your eyes and teeth checked. Stay up to date on all vaccines. This information is not intended to replace advice given to you by your health care provider. Make sure you discuss any questions you have with your health care provider. Document Revised: 08/22/2020 Document Reviewed: 08/22/2020 Elsevier Patient Education  [place].

## 2022-06-05 NOTE — Progress Notes (Signed)
Subjective:   By signing my name below, I, [name], attest that this documentation has been prepared under the direction and in the presence of [name], MD., 06/05/2022.   Patient ID: Sandra [name], female    DOB: February 15, 1961, 62 y.o.   MRN: [id number]  Chief Complaint  Patient presents with   Annual Exam    Annual Exam    HPI Patient is in today for a comprehensive physical exam and follow up on chronic medical concerns.   Depression/Grief Patient continues to partake in grief counseling to cope with the loss of her son who passed away at 42 yo. She is handling this loss well and has been traveling with her husband to keep herself busy.  Sacral Insufficiency Fractures Patient reports that she was admitted to the ED on 04/08/2022 after stepping into a hole while walking her dog and sustaining sacral insufficiency fractures. She has been following with Benjiman Core, PA-C, at [facility] and rheumatology. She currently takes Hydrocodone-Acetaminophen 5-325 mg every 6 hours as needed for the hip pain which has been tolerable. Additionally, she reports that her osteopenia has progressed to osteoporosis and is currently receiving annual Reclast infusions. Today she denies CP/palpitations/SOB/HA/fever/ chills/GI or GU symptoms.  Insomnia Patient complains that she is still having difficulty falling and staying asleep. She has taken magnesium glycinate and L-tryptophan in the past which were helpful, but she states that she has not been consistently taking these supplements.  Tissue Expander Surgery Patient has history of breast cancer and bilateral mastectomy and will undergo tissue expander surgery in 08/2022.  Weight Management Patient reports that due to her recent spinal injuries, she has been unable to exercise which has made weight loss difficult. Additionally, the death of her son led to depression and consequential weight gain. She will consider taking weight loss  medications. Body mass index is 34.25 kg/m.  Wt Readings from Last 3 Encounters:  06/05/22 205 lb 12.8 oz (93.4 kg)  06/03/22 206 lb (93.4 kg)  04/08/22 200 lb (90.7 kg)   Lab Results  Component Value Date   HGBA1C 5.9 09/03/2021   Past Medical History:  Diagnosis Date   Allergic rhinitis 05/31/2012   Breast cancer ([place])    right breast, dx 10/2021.   Chest pain    PT SAW CARDIOLOGIST DR. Burt Knack - STRESS ECHO DONE NEGATIVE - NO PROBLEM SINCE   Chicken pox as achild   Cough 11/29/2012   RESOLVED - IT WAS CAUSED BY LISINOPRIL   Elevated LFTs 05/31/2012   Eustachian tube dysfunction 07/27/2017   GERD (gastroesophageal reflux disease)    H/O tobacco use, presenting hazards to health 11/29/2012   Smoked 1 1/2 ppd for 20 years quit in roughtly 2007   Headache 11/25/2015   Hematuria 04/09/2014   Hoarseness 02/04/2012   RESOLVED - THOUGHT TO HAVE BEEN ALLERY RELATED   Hyperglycemia 04/06/2014   Hyperlipidemia    Hyperparathyroidism ([place])    Hypertension    Obesity, unspecified 05/31/2012   Ocular migraine    Overactive bladder 05/31/2012   Preventative health care 02/04/2012   PTSD (post-traumatic stress disorder)    Rapid heart beat    PT FEELS PROB RELATED TO HYPER PARATHYROIDISM   Rheumatoid arthritis (HCC)    SUI (stress urinary incontinence, female) 02/04/2012   Sees Dr Perlie Gold and they have discussed a bladder tack but so far she declines    Past Surgical History:  Procedure Laterality Date   BREAST EXCISIONAL BIOPSY Left  Age 66 Fibroadenoma   BREAST SURGERY     left breast excision of adenoma   btl     COLONOSCOPY  07/2020   FOOT SURGERY     MASTECTOMY Bilateral 11/28/2021   PARATHYROIDECTOMY Right 08/12/2013   Procedure: right inferior frozen section PARATHYROIDECTOMY;  Surgeon: Earnstine Regal, MD;  Location: [facility];  Service: General;  Laterality: Right;   PELVIC LAPAROSCOPY  03/2004   TOTAL SHOULDER REPLACEMENT Right 01/2019   TUBAL LIGATION      WISDOM TOOTH EXTRACTION  87 yrs ago    Family History  Problem Relation Age of Onset   Hypertension Mother 29       alive   Renal cancer Mother        renal   Hyperlipidemia Mother    Osteoporosis Mother        humerus    Macular degeneration Mother    Tremor Mother    Parkinson's disease Mother    Hypertension Father 39       alive   Atrial fibrillation Father    Colon cancer Father        colon   Hyperlipidemia Father    Stroke Father    Alzheimer's disease Father    Hypertension Sister    Hypertension Brother 73       alive   Tremor Brother    Asthma Son    Eczema Son    Healthy Son    Heart disease Paternal Aunt    Heart disease Paternal Uncle    Lung cancer Maternal Grandfather        lung/ smoker   Emphysema Maternal Grandfather    Heart disease Paternal Grandmother    Other Paternal Grandfather        black lung   Heart disease Paternal Grandfather     Social History   Socioeconomic History   Marital status: Married    Spouse name: Not on file   Number of children: 2   Years of education: Not on file   Highest education level: Not on file  Occupational History   Occupation: Programmer, multimedia: [employer]  Tobacco Use   Smoking status: Former    Packs/day: 1.00    Years: 20.00    Additional pack years: 0.00    Total pack years: 20.00    Types: Cigarettes    Quit date: 03/10/2005    Years since quitting: 17.2    Passive exposure: Never   Smokeless tobacco: Never  Vaping Use   Vaping Use: Never used  Substance and Sexual Activity   Alcohol use: Not Currently   Drug use: No   Sexual activity: Yes    Partners: Male  Other Topics Concern   Not on file  Social History Narrative   Not on file   Social Determinants of Health   Financial Resource Strain: Not on file  Food Insecurity: Not on file  Transportation Needs: Not on file  Physical Activity: Not on file  Stress: Not on file  Social Connections: Not on file  Intimate Partner  Violence: Not on file    Outpatient Medications Prior to Visit  Medication Sig Dispense Refill   ALPRAZolam (XANAX) 0.25 MG tablet Take 1 tablet (0.25 mg total) by mouth daily as needed. 15 tablet 0   aspirin 325 MG tablet Take 325 mg by mouth daily.     atorvastatin (LIPITOR) 80 MG tablet TAKE 1 TABLET BY MOUTH DAILY. 90 tablet 1  Cholecalciferol (VITAMIN D3) 125 MCG (5000 UT) CAPS Take by mouth 2 (two) times a week.     diclofenac (VOLTAREN) 75 MG EC tablet 1 tablet by mouth every 12 hours as needed with food     escitalopram (LEXAPRO) 10 MG tablet TAKE 1 TABLET BY MOUTH EVERY DAY 90 tablet 1   furosemide (LASIX) 20 MG tablet Take 1 tablet (20 mg total) by mouth daily. 90 tablet 1   HYDROcodone-acetaminophen (NORCO/VICODIN) 5-325 MG tablet Take 1 tablet by mouth every 6 (six) hours as needed.     irbesartan (AVAPRO) 150 MG tablet TAKE 1 TABLET BY MOUTH EVERY DAY 90 tablet 1   lactobacillus acidophilus (BACID) TABS tablet Take 2 tablets by mouth 3 (three) times daily.     levothyroxine (SYNTHROID) 75 MCG tablet Take 1 tablet (75 mcg total) by mouth daily before breakfast. 90 tablet 1   methocarbamol (ROBAXIN) 500 MG tablet Take 1 tablet (500 mg total) by mouth 2 (two) times daily. 20 tablet 0   Metoprolol Succinate 25 MG CS24      omeprazole (PRILOSEC) 40 MG capsule TAKE 1 CAPSULE BY MOUTH EVERY DAY 90 capsule 1   Trospium Chloride 60 MG CP24 trospium ER 60 mg capsule,extended release 24 hr  TAKE 1 CAPSULE BY MOUTH EVERY DAY     TURMERIC PO Take 1 tablet by mouth 2 (two) times daily.     verapamil (CALAN-SR) 120 MG CR tablet TAKE 1 TABLET BY MOUTH EVERYDAY AT BEDTIME 90 tablet 2   Co-Enzyme Q-10 30 MG CAPS Take 100 mg by mouth at bedtime. (Patient not taking: Reported on 02/18/2022)     losartan (COZAAR) 25 MG tablet 1 tablet Orally Once a day     metoprolol succinate (TOPROL-XL) 25 MG 24 hr tablet TAKE 1 TABLET (25 MG TOTAL) BY MOUTH DAILY. 90 tablet 1   Omega-3 Fatty Acids (OMEGA 3 PO)  Take by mouth.     RINVOQ 15 MG TB24 TAKE 1 TABLET BY MOUTH EVERY DAY (Patient not taking: Reported on 11/19/2021) 90 tablet 0   TART CHERRY PO Take by mouth. (Patient not taking: Reported on 11/19/2021)     Facility-Administered Medications Prior to Visit  Medication Dose Route Frequency Provider Last Rate Last Admin   betamethasone acetate-betamethasone sodium phosphate (CELESTONE) injection 3 mg  3 mg Intra-articular Once Edrick Kins, DPM        Allergies  Allergen Reactions   Cefdinir Other (See Comments) and Nausea And Vomiting    Severe Stomach cramps and diarrhea for several days.   Doxycycline Diarrhea   Codeine Nausea And Vomiting   Penicillins     RASH, ITCHING   Simvastatin     myalgias   Other Rash    Review of Systems  Constitutional:  Negative for chills and fever.  Respiratory:  Negative for shortness of breath.   Cardiovascular:  Negative for chest pain and palpitations.  Gastrointestinal:  Negative for abdominal pain, blood in stool, constipation, diarrhea, nausea and vomiting.  Genitourinary:  Negative for dysuria, frequency, hematuria and urgency.  Musculoskeletal:  Positive for joint pain (bilateral hip pain). Back pain: right hip pain. Skin:           Neurological:  Negative for headaches.       Objective:    Physical Exam Constitutional:      General: She is not in acute distress.    Appearance: Normal appearance. She is normal weight. She is not ill-appearing.  HENT:  Head: Normocephalic and atraumatic.     Right Ear: Tympanic membrane, ear canal and external ear normal.     Left Ear: Tympanic membrane, ear canal and external ear normal.     Nose: Nose normal.     Mouth/Throat:     Mouth: Mucous membranes are moist.     Pharynx: Oropharynx is clear.  Eyes:     General:        Right eye: No discharge.        Left eye: No discharge.     Extraocular Movements: Extraocular movements intact.     Right eye: No nystagmus.     Left eye: No  nystagmus.     Pupils: Pupils are equal, round, and reactive to light.  Neck:     Vascular: No carotid bruit.  Cardiovascular:     Rate and Rhythm: Normal rate and regular rhythm.     Pulses: Normal pulses.     Heart sounds: Normal heart sounds. No murmur heard.    No gallop.  Pulmonary:     Effort: Pulmonary effort is normal. No respiratory distress.     Breath sounds: Normal breath sounds. No wheezing or rales.  Abdominal:     General: Bowel sounds are normal.     Palpations: Abdomen is soft.     Tenderness: There is no abdominal tenderness. There is no guarding.  Musculoskeletal:        General: Normal range of motion.     Cervical back: Normal range of motion.     Right lower leg: No edema.     Left lower leg: No edema.     Comments: Muscle strength 5/5 on upper and lower extremities.   Lymphadenopathy:     Cervical: No cervical adenopathy.  Skin:    General: Skin is warm and dry.  Neurological:     Mental Status: She is alert and oriented to person, place, and time.     Sensory: Sensation is intact.     Motor: Motor function is intact.     Coordination: Coordination is intact.     Deep Tendon Reflexes:     Reflex Scores:      Patellar reflexes are 2+ on the right side and 2+ on the left side. Psychiatric:        Mood and Affect: Mood normal.        Behavior: Behavior normal.        Judgment: Judgment normal.     BP 132/78 (BP Location: Right Arm, Patient Position: Sitting, Cuff Size: Normal)   Pulse 96   Temp 97.8 F (36.6 C) (Oral)   Resp 16   Ht 5\' 5"  (1.651 m)   Wt 205 lb 12.8 oz (93.4 kg)   LMP 04/01/2011   SpO2 95%   BMI 34.25 kg/m  Wt Readings from Last 3 Encounters:  06/05/22 205 lb 12.8 oz (93.4 kg)  06/03/22 206 lb (93.4 kg)  04/08/22 200 lb (90.7 kg)    Diabetic Foot Exam - Simple   No data filed    Lab Results  Component Value Date   WBC 6.7 06/03/2022   HGB 12.0 06/03/2022   HCT 37.1 06/03/2022   PLT 323 06/03/2022   GLUCOSE 79  06/03/2022   CHOL 287 (H) 09/03/2021   TRIG 231.0 (H) 09/03/2021   HDL 69.80 09/03/2021   LDLDIRECT 179.0 09/03/2021   LDLCALC 163 (H) 11/08/2020   ALT 16 06/03/2022   AST 16 06/03/2022   NA 137  06/03/2022   K 4.6 06/03/2022   CL 101 06/03/2022   CREATININE 0.69 06/03/2022   BUN 19 06/03/2022   CO2 30 06/03/2022   TSH 0.93 09/03/2021   HGBA1C 5.9 09/03/2021    Lab Results  Component Value Date   TSH 0.93 09/03/2021   Lab Results  Component Value Date   WBC 6.7 06/03/2022   HGB 12.0 06/03/2022   HCT 37.1 06/03/2022   MCV 86.3 06/03/2022   PLT 323 06/03/2022   Lab Results  Component Value Date   NA 137 06/03/2022   K 4.6 06/03/2022   CO2 30 06/03/2022   GLUCOSE 79 06/03/2022   BUN 19 06/03/2022   CREATININE 0.69 06/03/2022   BILITOT 0.3 06/03/2022   ALKPHOS 81 01/09/2022   AST 16 06/03/2022   ALT 16 06/03/2022   PROT 7.0 06/03/2022   ALBUMIN 3.9 01/09/2022   CALCIUM 9.7 06/03/2022   ANIONGAP 10 11/01/2020   EGFR 99 06/03/2022   GFR 102.76 01/09/2022   Lab Results  Component Value Date   CHOL 287 (H) 09/03/2021   Lab Results  Component Value Date   HDL 69.80 09/03/2021   Lab Results  Component Value Date   LDLCALC 163 (H) 11/08/2020   Lab Results  Component Value Date   TRIG 231.0 (H) 09/03/2021   Lab Results  Component Value Date   CHOLHDL 4 09/03/2021   Lab Results  Component Value Date   HGBA1C 5.9 09/03/2021      Assessment & Plan:  Colonoscopy: Last completed on 07/03/2014. Impression: No colorectal neoplasia- normal colonoscopy. Repeat in 5 years due to family history of colon cancer.  DEXA: Last completed on 09/09/2021.  The BMD measured at Forearm Radius 33% is 0.741 g/cm2 with a T-score of -1.5. This patient is considered osteopenic according to [facility]) criteria. Compared with the prior study on, 08/12/2019 the BMD of the total mean shows no statistically significant change. The scan quality is good. Repeat in  2-5 years.  Mammogram: Last completed on 10/03/2021 with no mammographic evidence of malignancy. Repeat in 1-2 years.  Pap Smear: Last completed on 04/12/2020 with normal results. Repeat in 3-5 years.  Advanced Directives: Encouraged patient to complete advanced care planning documents.  Immunizations: Reviewed patient's immunization history and encouraged   Labs: Routine blood work ordered.  Depression/Grief: Patient is handling the loss of her son well through grief counseling.   Insomnia: Recommended magnesium glycinate and L-tryptophan at bedtime.  Weight Management: Encouraged 6-8 hours of sleep, heart healthy diet, 60-80 oz of non-alcohol/non-caffeinated fluids, and minimum of 4000 steps daily. Patient will consider weight loss medications.  Problem List Items Addressed This Visit     Hyperglycemia - Primary    hgba1c acceptable, minimize simple carbs. Increase exercise as tolerated.       Relevant Orders   HgB A1c   Hyperlipidemia    Encourage heart healthy diet such as MIND or DASH diet, increase exercise, avoid trans fats, simple carbohydrates and processed foods, consider a krill or fish or flaxseed oil cap daily.        Relevant Orders   Lipid panel   Hyperparathyroidism, primary ([place])   Relevant Orders   PTH, Intact and Calcium   Hypertension    Well controlled, no changes to meds. Encouraged heart healthy diet such as the DASH diet and exercise as tolerated.       Relevant Orders   TSH   Muscle cramp    Hydrate and  monitor       Relevant Orders   Magnesium   Osteoporosis   Relevant Orders   VITAMIN D 25 Hydroxy (Vit-D Deficiency, Fractures)   Preventative health care    Patient encouraged to maintain heart healthy diet, regular exercise, adequate sleep. Consider daily probiotics. Take medications as prescribed Labs ordered and reviewed Given and reviewed copy of ACP documents from [place] [facility] [place] and encouraged to complete and return  MM July  2023 Pap 2022 Colonoscopy 2016      No orders of the defined types were placed in this encounter.  I, Penni Homans, MD, personally preformed the services described in this documentation.  All medical record entries made by the scribe were at my direction and in my presence.  I have reviewed the chart and discharge instructions (if applicable) and agree that the record reflects my personal performance and is accurate and complete. 06/05/2022  I,Mohammed Iqbal,acting as a scribe for Penni Homans, MD.,have documented all relevant documentation on the behalf of Penni Homans, MD,as directed by  Penni Homans, MD while in the presence of Penni Homans, MD.  Penni Homans, MD

## 2022-06-06 NOTE — Progress Notes (Signed)
Reclast scheduled for 06/09/22. Will f/u to ensure completed  Knox Saliva, PharmD, MPH, BCPS, CPP Clinical Pharmacist (Rheumatology and Pulmonology)

## 2022-06-09 ENCOUNTER — Ambulatory Visit (INDEPENDENT_AMBULATORY_CARE_PROVIDER_SITE_OTHER): Payer: BLUE CROSS/BLUE SHIELD

## 2022-06-09 ENCOUNTER — Ambulatory Visit: Payer: BLUE CROSS/BLUE SHIELD | Admitting: Podiatry

## 2022-06-09 VITALS — BP 110/73 | HR 71 | Temp 98.2°F | Resp 18 | Ht 65.0 in | Wt 204.2 lb

## 2022-06-09 DIAGNOSIS — M816 Localized osteoporosis [Lequesne]: Secondary | ICD-10-CM | POA: Diagnosis not present

## 2022-06-09 MED ORDER — ZOLEDRONIC ACID 5 MG/100ML IV SOLN
5.0000 mg | Freq: Once | INTRAVENOUS | Status: AC
  Administered 2022-06-09: 5 mg via INTRAVENOUS
  Filled 2022-06-09: qty 100

## 2022-06-09 MED ORDER — DIPHENHYDRAMINE HCL 25 MG PO CAPS
25.0000 mg | ORAL_CAPSULE | Freq: Once | ORAL | Status: AC
  Administered 2022-06-09: 25 mg via ORAL
  Filled 2022-06-09: qty 1

## 2022-06-09 MED ORDER — ACETAMINOPHEN 325 MG PO TABS
650.0000 mg | ORAL_TABLET | Freq: Once | ORAL | Status: AC
  Administered 2022-06-09: 650 mg via ORAL
  Filled 2022-06-09: qty 2

## 2022-06-09 MED ORDER — SODIUM CHLORIDE 0.9 % IV SOLN: INTRAVENOUS | Status: DC

## 2022-06-09 NOTE — Patient Instructions (Signed)

## 2022-06-09 NOTE — Progress Notes (Signed)
Diagnosis: Osteoporosis  Provider:  Marshell Garfinkel MD  Procedure: Infusion  IV Type: Peripheral, IV Location: R Forearm  Reclast (Zolendronic Acid), Dose: 5 mg  Infusion Start Time: [id number]  Infusion Stop Time: 1150 am  Post Infusion IV Care: Observation period completed and Peripheral IV Discontinued  Discharge: Condition: Good, Destination: Home . AVS Provided  Performed by:  Cleophus Molt, RN

## 2022-06-11 ENCOUNTER — Ambulatory Visit: Payer: BLUE CROSS/BLUE SHIELD | Admitting: Podiatry

## 2022-06-11 DIAGNOSIS — F4321 Adjustment disorder with depressed mood: Secondary | ICD-10-CM | POA: Diagnosis not present

## 2022-06-11 DIAGNOSIS — M19071 Primary osteoarthritis, right ankle and foot: Secondary | ICD-10-CM | POA: Diagnosis not present

## 2022-06-11 DIAGNOSIS — M19072 Primary osteoarthritis, left ankle and foot: Secondary | ICD-10-CM

## 2022-06-11 MED ORDER — BETAMETHASONE SOD PHOS & ACET 6 (3-3) MG/ML IJ SUSP
3.0000 mg | Freq: Once | INTRAMUSCULAR | Status: AC
Start: 2022-06-11 — End: 2022-06-11
  Administered 2022-06-11: 3 mg via INTRA_ARTICULAR

## 2022-06-11 MED ORDER — RINVOQ 15 MG PO TB24
15.0000 mg | ORAL_TABLET | Freq: Every day | ORAL | 2 refills | Status: DC
  Filled 2022-06-11 – 2022-06-30 (×7): qty 30, 30d supply, fill #0
  Filled 2022-07-22: qty 30, 30d supply, fill #1
  Filled 2022-08-19: qty 30, 30d supply, fill #2

## 2022-06-11 NOTE — Progress Notes (Signed)
Chief Complaint  Patient presents with   Arthritis    Patient came in today for 3 month follow-up arthritis injections, rate of pain 6 out of 10, top of the foot pain, throbbing,     Subjective:  62 year old female PMHx rheumatoid arthritis presenting today for follow-up management of chronic bilateral midtarsal pain.  She is being seen by rheumatology however recently she has had to discontinue her rheumatology medications because she had double mastectomies and is still in the healing phases.  Past Medical History:  Diagnosis Date   Allergic rhinitis 05/31/2012   Breast cancer ([place])    right breast, dx 10/2021.   Chest pain    PT SAW CARDIOLOGIST DR. Burt Knack - STRESS ECHO DONE NEGATIVE - NO PROBLEM SINCE   Chicken pox as achild   Cough 11/29/2012   RESOLVED - IT WAS CAUSED BY LISINOPRIL   Elevated LFTs 05/31/2012   Eustachian tube dysfunction 07/27/2017   GERD (gastroesophageal reflux disease)    H/O tobacco use, presenting hazards to health 11/29/2012   Smoked 1 1/2 ppd for 20 years quit in roughtly 2007   Headache 11/25/2015   Hematuria 04/09/2014   Hoarseness 02/04/2012   RESOLVED - THOUGHT TO HAVE BEEN ALLERY RELATED   Hyperglycemia 04/06/2014   Hyperlipidemia    Hyperparathyroidism ([place])    Hypertension    Obesity, unspecified 05/31/2012   Ocular migraine    Overactive bladder 05/31/2012   Preventative health care 02/04/2012   PTSD (post-traumatic stress disorder)    Rapid heart beat    PT FEELS PROB RELATED TO HYPER PARATHYROIDISM   Rheumatoid arthritis ([facility])    SUI (stress urinary incontinence, female) 02/04/2012   Sees Dr Perlie Gold and they have discussed a bladder tack but so far she declines   Past Surgical History:  Procedure Laterality Date   BREAST EXCISIONAL BIOPSY Left    Age 7 Fibroadenoma   BREAST SURGERY     left breast excision of adenoma   btl     COLONOSCOPY  07/2020   FOOT SURGERY     MASTECTOMY Bilateral 11/28/2021   PARATHYROIDECTOMY  Right 08/12/2013   Procedure: right inferior frozen section PARATHYROIDECTOMY;  Surgeon: Earnstine Regal, MD;  Location: [facility];  Service: General;  Laterality: Right;   PELVIC LAPAROSCOPY  03/2004   TOTAL SHOULDER REPLACEMENT Right 01/2019   TUBAL LIGATION     WISDOM TOOTH EXTRACTION  30 yrs ago   Allergies  Allergen Reactions   Cefdinir Other (See Comments) and Nausea And Vomiting    Severe Stomach cramps and diarrhea for several days.   Doxycycline Diarrhea   Codeine Nausea And Vomiting   Penicillins     RASH, ITCHING   Simvastatin     myalgias   Other Rash   Objective: Physical Exam General: The patient is alert and oriented x3 in no acute distress.  Dermatology: Skin is cool, dry and supple bilateral lower extremities. Negative for open lesions or macerations.  Vascular: Palpable pedal pulses bilaterally.  There is some redness with edema noted to the right fifth digit.  No open wounds in this area.  No heat.  Neurological: Epicritic and protective threshold grossly intact bilaterally.   Musculoskeletal Exam: Chronic pain on palpation throughout the midtarsal joint bilateral  Radiographic exam B/L feet 04/03/2021 Normal osseous mineralization.  There is some degenerative changes noted throughout the lesser TMT joints consistent with the patient's given history of rheumatoid and osteoarthritis.  No acute fractures identified.  Assessment: 1. s/p  excision of benign skin lesion left plantar foot. DOS: 06/13/2021 2. Chronic bilateral midfoot capsulitis  Plan of Care:  1. Patient was evaluated.  Patient declined x-rays of the right fifth digit. 2.  Injection of 0.5 cc Celestone Soluspan injection of the bilateral midtarsal joint 3.  Continue close management with rheumatology 4.  Continue custom molded orthotics 5.  Return to clinic as needed, or about 3 months for follow-up care  Edrick Kins, DPM [facility]  Dr. Edrick Kins, DPM    [address], [place] [address]                Office [phone]  Fax [phone]

## 2022-06-11 NOTE — Telephone Encounter (Signed)
Please clarify if she is requesting a refill of rinvoq?

## 2022-06-11 NOTE — Telephone Encounter (Signed)
Rinvoq has been approved per Knox Saliva, RPH.  Once we have clearance to restart rinvoq--ok to send prescription to [facility]

## 2022-06-12 ENCOUNTER — Telehealth: Payer: Self-pay | Admitting: *Deleted

## 2022-06-12 ENCOUNTER — Telehealth: Payer: Self-pay | Admitting: Family Medicine

## 2022-06-12 ENCOUNTER — Other Ambulatory Visit: Payer: Self-pay | Admitting: Family Medicine

## 2022-06-12 ENCOUNTER — Other Ambulatory Visit (HOSPITAL_COMMUNITY): Payer: Self-pay

## 2022-06-12 ENCOUNTER — Encounter: Payer: Self-pay | Admitting: Family Medicine

## 2022-06-12 ENCOUNTER — Other Ambulatory Visit (INDEPENDENT_AMBULATORY_CARE_PROVIDER_SITE_OTHER): Payer: BLUE CROSS/BLUE SHIELD

## 2022-06-12 ENCOUNTER — Other Ambulatory Visit: Payer: Self-pay

## 2022-06-12 DIAGNOSIS — I10 Essential (primary) hypertension: Secondary | ICD-10-CM

## 2022-06-12 DIAGNOSIS — R252 Cramp and spasm: Secondary | ICD-10-CM | POA: Diagnosis not present

## 2022-06-12 DIAGNOSIS — R739 Hyperglycemia, unspecified: Secondary | ICD-10-CM | POA: Diagnosis not present

## 2022-06-12 DIAGNOSIS — E782 Mixed hyperlipidemia: Secondary | ICD-10-CM

## 2022-06-12 DIAGNOSIS — M810 Age-related osteoporosis without current pathological fracture: Secondary | ICD-10-CM

## 2022-06-12 DIAGNOSIS — E210 Primary hyperparathyroidism: Secondary | ICD-10-CM

## 2022-06-12 LAB — LIPID PANEL
Cholesterol: 250 mg/dL — ABNORMAL HIGH (ref 0–200)
HDL: 75.6 mg/dL (ref >39.00)
LDL Cholesterol: 147 mg/dL — ABNORMAL HIGH (ref 0–99)
NonHDL: 174.38
Total CHOL/HDL Ratio: 3
Triglycerides: 136 mg/dL (ref 0.0–149.0)
VLDL: 27.2 mg/dL (ref 0.0–40.0)

## 2022-06-12 LAB — TSH: TSH: 0.57 u[IU]/mL (ref 0.35–5.50)

## 2022-06-12 LAB — VITAMIN D 25 HYDROXY (VIT D DEFICIENCY, FRACTURES): VITD: 37.61 ng/mL (ref 30.00–100.00)

## 2022-06-12 LAB — HEMOGLOBIN A1C: Hgb A1c MFr Bld: 6.4 % (ref 4.6–6.5)

## 2022-06-12 LAB — MAGNESIUM: Magnesium: 2.2 mg/dL (ref 1.5–2.5)

## 2022-06-12 MED ORDER — EZETIMIBE 10 MG PO TABS: 10.0000 mg | ORAL_TABLET | Freq: Every day | ORAL | 1 refills | Status: DC

## 2022-06-12 MED ORDER — ATORVASTATIN CALCIUM 80 MG PO TABS: ORAL_TABLET | ORAL | 1 refills | Status: DC

## 2022-06-12 NOTE — Telephone Encounter (Signed)
Patient left message stating Sandra [name] needs a verbal RX for Rinvoq for the 1st fill, patient said she has an [place] discount card.

## 2022-06-12 NOTE — Telephone Encounter (Signed)
Pt came in office stating saw provider last wk and spoke about getting weight lost meds that her insurance will cover, pt is wanting to get the meds sent ASAP to her pharmacy: [id number] - [place], [place] [address], [place] [address] Phone: [phone]  Fax: [phone] DEA #: [id number]   Please advise. Any question call pt at [phone]

## 2022-06-12 NOTE — Telephone Encounter (Signed)
Called pt was advised, per provider she needs to call her  insurance to found out what medication is cover and what pharmacy.

## 2022-06-13 ENCOUNTER — Encounter: Payer: Self-pay | Admitting: Family Medicine

## 2022-06-13 ENCOUNTER — Encounter: Payer: Self-pay | Admitting: Physician Assistant

## 2022-06-13 ENCOUNTER — Other Ambulatory Visit: Payer: Self-pay

## 2022-06-13 ENCOUNTER — Other Ambulatory Visit (HOSPITAL_COMMUNITY): Payer: Self-pay

## 2022-06-13 LAB — PTH, INTACT AND CALCIUM
Calcium: 9.1 mg/dL (ref 8.6–10.4)
PTH: 151 pg/mL — ABNORMAL HIGH (ref 16–77)

## 2022-06-13 NOTE — Telephone Encounter (Signed)
Rx has been triaged to [facility]. Patient will be re-onboarded since she has been off of treatment  Chesley Mires, PharmD, MPH, BCPS, CPP Clinical Pharmacist (Rheumatology and Pulmonology)

## 2022-06-13 NOTE — Telephone Encounter (Signed)
Ran test claim and received this message- patient needs to activate her Copay card:    Called patient, and left a message with phone number.

## 2022-06-15 ENCOUNTER — Other Ambulatory Visit: Payer: Self-pay | Admitting: Family Medicine

## 2022-06-15 DIAGNOSIS — E210 Primary hyperparathyroidism: Secondary | ICD-10-CM

## 2022-06-16 ENCOUNTER — Other Ambulatory Visit (HOSPITAL_COMMUNITY): Payer: Self-pay

## 2022-06-18 DIAGNOSIS — M1612 Unilateral primary osteoarthritis, left hip: Secondary | ICD-10-CM | POA: Diagnosis not present

## 2022-06-18 DIAGNOSIS — M25552 Pain in left hip: Secondary | ICD-10-CM | POA: Diagnosis not present

## 2022-06-19 ENCOUNTER — Other Ambulatory Visit (HOSPITAL_COMMUNITY): Payer: Self-pay

## 2022-06-19 DIAGNOSIS — M1612 Unilateral primary osteoarthritis, left hip: Secondary | ICD-10-CM | POA: Diagnosis not present

## 2022-06-20 ENCOUNTER — Other Ambulatory Visit (HOSPITAL_COMMUNITY): Payer: Self-pay

## 2022-06-20 NOTE — Telephone Encounter (Signed)
Pt returned call, [employer] copay support conferenced in. Rep is submitting an escalation request to have a PA override generate, she states that this can take up to 72 hours. My direct office number has been provided to be contacted at the conclusion of the escalation request.   Pt states she has enough medication that she should be able to get through another week. She was also able to provide me with the manufacturer debit card information so that I can ensure the pharmacy has this on file.   Will await f/u from [employer] copay support escalation team.

## 2022-06-20 NOTE — Telephone Encounter (Signed)
Attempted to run another test claim and received the same results. Called [employer] copay support services and spoke with representative and explained the situation. Rep confirmed that all the pt needed to do was call. I expressed my concerns over the difficulty that pt had when she called the other day and the rep stated that she can see where the pt called in and states that "there was something done" but she was unable to share that information with me. I informed her that I had just attempted another test claim prior to calling and continued to receive the same rejection, giving me doubt that anything meaningful was accomplished during pt's call.  Attempted to conference the pt in--I called twice but had to end up leaving a voicemail. I did my best to provide explicit instructions on what to say to the representative when she gets one on the line, and requested that she please call us if she has any further questions.  Will continue to await f/u.

## 2022-06-23 ENCOUNTER — Telehealth: Payer: Self-pay | Admitting: Rheumatology

## 2022-06-23 NOTE — Telephone Encounter (Signed)
Plan was to proceed with IV reclast.  I would like to discuss further with you tomorrow.

## 2022-06-23 NOTE — Telephone Encounter (Signed)
FYI  Patient called the office stating she has a fractured hip. Patient states the MRI and notes are in Care Everywhere. Patient states she was seen by [facility].

## 2022-06-24 NOTE — Telephone Encounter (Signed)
Patient recently had Reclast we will just continue Reclast.  She is currently taking vitamin D 5000 units twice a week.  Please advise patient to take vitamin D 10,000 units twice a week.

## 2022-06-24 NOTE — Telephone Encounter (Signed)
Patient recently had Reclast we will just continue Reclast.  She is currently taking vitamin D 5000 units twice a week.  Patient advised to take vitamin D 10,000 units twice a week. Patient expressed understanding.

## 2022-06-25 ENCOUNTER — Other Ambulatory Visit (HOSPITAL_COMMUNITY): Payer: Self-pay

## 2022-06-25 NOTE — Telephone Encounter (Signed)
[facility] contacted me stating that [employer] copay support had reached out to them. Apparently they are showing a paid claim from 4/5 on their end, however per Rachael's screenshot the claim never went through on ours. After some back-and-forth with the rep she finally informed me that she will get to work reversing the claim on their end and will reach back out to me with a PA override code. She states that this process could take anywhere from 5 minutes to 48 hours. Once again I provided my direct office number.  Reached out to the pt and LVM providing update. Will await response from [employer] rep.

## 2022-06-26 ENCOUNTER — Other Ambulatory Visit (HOSPITAL_COMMUNITY): Payer: Self-pay

## 2022-06-30 ENCOUNTER — Other Ambulatory Visit: Payer: Self-pay

## 2022-06-30 ENCOUNTER — Other Ambulatory Visit (HOSPITAL_COMMUNITY): Payer: Self-pay

## 2022-06-30 NOTE — Telephone Encounter (Signed)
Per Verlon Au at [facility] called and provided PA override code. Manufacturer Debit card info provided to [place] who will provide it to a different onsite tech as there is no one from the [employer] team working onsite today. Nothing further should be needed at this time.

## 2022-06-30 NOTE — Telephone Encounter (Signed)
[facility] has received the override code from [place] and the manufacturer debit card has been provided. Fill is scheduled to go out tomorrow morning and will hopefully be delivered to the pt by Wednesday 4/24. Called pt and LVM providing update.

## 2022-07-01 ENCOUNTER — Other Ambulatory Visit (HOSPITAL_COMMUNITY): Payer: Self-pay

## 2022-07-02 ENCOUNTER — Other Ambulatory Visit (HOSPITAL_COMMUNITY): Payer: Self-pay

## 2022-07-03 ENCOUNTER — Other Ambulatory Visit (HOSPITAL_COMMUNITY): Payer: Self-pay

## 2022-07-04 DIAGNOSIS — F4321 Adjustment disorder with depressed mood: Secondary | ICD-10-CM | POA: Diagnosis not present

## 2022-07-07 ENCOUNTER — Other Ambulatory Visit (HOSPITAL_COMMUNITY): Payer: Self-pay

## 2022-07-14 DIAGNOSIS — M1612 Unilateral primary osteoarthritis, left hip: Secondary | ICD-10-CM | POA: Diagnosis not present

## 2022-07-14 DIAGNOSIS — M25552 Pain in left hip: Secondary | ICD-10-CM | POA: Diagnosis not present

## 2022-07-17 ENCOUNTER — Other Ambulatory Visit (HOSPITAL_COMMUNITY): Payer: Self-pay

## 2022-07-17 DIAGNOSIS — F4321 Adjustment disorder with depressed mood: Secondary | ICD-10-CM | POA: Diagnosis not present

## 2022-07-22 ENCOUNTER — Other Ambulatory Visit (HOSPITAL_COMMUNITY): Payer: Self-pay

## 2022-07-23 ENCOUNTER — Other Ambulatory Visit (HOSPITAL_COMMUNITY): Payer: Self-pay

## 2022-07-28 DIAGNOSIS — Z8639 Personal history of other endocrine, nutritional and metabolic disease: Secondary | ICD-10-CM | POA: Diagnosis not present

## 2022-07-28 DIAGNOSIS — N2581 Secondary hyperparathyroidism of renal origin: Secondary | ICD-10-CM | POA: Diagnosis not present

## 2022-07-28 DIAGNOSIS — M810 Age-related osteoporosis without current pathological fracture: Secondary | ICD-10-CM | POA: Diagnosis not present

## 2022-07-28 DIAGNOSIS — E063 Autoimmune thyroiditis: Secondary | ICD-10-CM | POA: Diagnosis not present

## 2022-07-28 DIAGNOSIS — Z8781 Personal history of (healed) traumatic fracture: Secondary | ICD-10-CM | POA: Diagnosis not present

## 2022-07-29 DIAGNOSIS — F4321 Adjustment disorder with depressed mood: Secondary | ICD-10-CM | POA: Diagnosis not present

## 2022-08-02 ENCOUNTER — Other Ambulatory Visit: Payer: Self-pay | Admitting: Family Medicine

## 2022-08-12 DIAGNOSIS — C50911 Malignant neoplasm of unspecified site of right female breast: Secondary | ICD-10-CM | POA: Diagnosis not present

## 2022-08-12 DIAGNOSIS — Z170 Estrogen receptor positive status [ER+]: Secondary | ICD-10-CM | POA: Diagnosis not present

## 2022-08-14 DIAGNOSIS — K219 Gastro-esophageal reflux disease without esophagitis: Secondary | ICD-10-CM | POA: Diagnosis not present

## 2022-08-14 DIAGNOSIS — Z9013 Acquired absence of bilateral breasts and nipples: Secondary | ICD-10-CM | POA: Diagnosis not present

## 2022-08-14 DIAGNOSIS — Z421 Encounter for breast reconstruction following mastectomy: Secondary | ICD-10-CM | POA: Diagnosis not present

## 2022-08-14 DIAGNOSIS — Z170 Estrogen receptor positive status [ER+]: Secondary | ICD-10-CM | POA: Diagnosis not present

## 2022-08-14 DIAGNOSIS — N3281 Overactive bladder: Secondary | ICD-10-CM | POA: Diagnosis not present

## 2022-08-14 DIAGNOSIS — C50911 Malignant neoplasm of unspecified site of right female breast: Secondary | ICD-10-CM | POA: Diagnosis not present

## 2022-08-14 DIAGNOSIS — N6489 Other specified disorders of breast: Secondary | ICD-10-CM | POA: Diagnosis not present

## 2022-08-14 DIAGNOSIS — Z9089 Acquired absence of other organs: Secondary | ICD-10-CM | POA: Diagnosis not present

## 2022-08-14 DIAGNOSIS — Z853 Personal history of malignant neoplasm of breast: Secondary | ICD-10-CM | POA: Diagnosis not present

## 2022-08-14 DIAGNOSIS — G4733 Obstructive sleep apnea (adult) (pediatric): Secondary | ICD-10-CM | POA: Diagnosis not present

## 2022-08-14 DIAGNOSIS — R7303 Prediabetes: Secondary | ICD-10-CM | POA: Diagnosis not present

## 2022-08-14 DIAGNOSIS — I10 Essential (primary) hypertension: Secondary | ICD-10-CM | POA: Diagnosis not present

## 2022-08-14 DIAGNOSIS — Z9889 Other specified postprocedural states: Secondary | ICD-10-CM | POA: Diagnosis not present

## 2022-08-14 DIAGNOSIS — Z7984 Long term (current) use of oral hypoglycemic drugs: Secondary | ICD-10-CM | POA: Diagnosis not present

## 2022-08-14 DIAGNOSIS — R600 Localized edema: Secondary | ICD-10-CM | POA: Diagnosis not present

## 2022-08-14 DIAGNOSIS — M810 Age-related osteoporosis without current pathological fracture: Secondary | ICD-10-CM | POA: Diagnosis not present

## 2022-08-14 DIAGNOSIS — E039 Hypothyroidism, unspecified: Secondary | ICD-10-CM | POA: Diagnosis not present

## 2022-08-14 DIAGNOSIS — E785 Hyperlipidemia, unspecified: Secondary | ICD-10-CM | POA: Diagnosis not present

## 2022-08-14 DIAGNOSIS — F32A Depression, unspecified: Secondary | ICD-10-CM | POA: Diagnosis not present

## 2022-08-14 HISTORY — PX: TISSUE EXPANDER PLACEMENT: SHX2530

## 2022-08-15 ENCOUNTER — Other Ambulatory Visit: Payer: Self-pay | Admitting: Family Medicine

## 2022-08-18 DIAGNOSIS — F4321 Adjustment disorder with depressed mood: Secondary | ICD-10-CM | POA: Diagnosis not present

## 2022-08-19 ENCOUNTER — Other Ambulatory Visit (HOSPITAL_COMMUNITY): Payer: Self-pay

## 2022-08-21 NOTE — Progress Notes (Unsigned)
Office Visit Note  Patient: Sandra [name]             Date of Birth: [birth year]           MRN: [id number]             PCP: Bradd Canary, MD Referring: Bradd Canary, MD Visit Date: 09/04/2022 Occupation: @GUAROCC @  Subjective:  Pain in both hands and both feet   History of Present Illness: Sandra [name] is a 62 y.o. female with history of seronegative rheumatoid arthritis.  Patient is currently taking Rinvoq 15 mg 1 tablet by mouth daily.  Patient states that she recently had surgery for her tissue expander on 08/14/2022.  She states she had rinvoq for 2 weeks prior to and 2 weeks after surgery.  She resumed  rinvoq this week.  She states that she is currently having a flare in both hands and both feet.  She is also had increased discomfort and intermittent inflammation in her right knee.  Patient states she continues to follow-up with Dr. Logan Bores every 3 months and has injections in her feet which alleviate her discomfort temporarily.  She states she is also planning to have a right knee joint cortisone injection soon. Patient had an IV Reclast infusion on 06/09/2022.  She tolerated Reclast without any side effects. She denies any recent or recurrent infections.   Activities of Daily Living:  Patient reports morning stiffness for 2-3 hours.   Patient Reports nocturnal pain.  Difficulty dressing/grooming: Denies Difficulty climbing stairs: Denies Difficulty getting out of chair: Denies Difficulty using hands for taps, buttons, cutlery, and/or writing: Reports  Review of Systems  Constitutional:  Positive for fatigue.  HENT:  Positive for mouth dryness. Negative for mouth sores and nose dryness.   Eyes:  Positive for dryness. Negative for pain and visual disturbance.  Respiratory:  Positive for shortness of breath. Negative for cough, hemoptysis and difficulty breathing.        With exertion   Cardiovascular:  Negative for chest pain, palpitations, hypertension and swelling  in legs/feet.  Gastrointestinal:  Negative for blood in stool, constipation and diarrhea.  Endocrine: Negative for increased urination.  Genitourinary:  Negative for painful urination.  Musculoskeletal:  Positive for joint pain, joint pain, joint swelling, myalgias, morning stiffness, muscle tenderness and myalgias. Negative for muscle weakness.  Skin:  Negative for color change, pallor, rash, hair loss, nodules/bumps, skin tightness, ulcers and sensitivity to sunlight.  Allergic/Immunologic: Negative for susceptible to infections.  Neurological:  Negative for dizziness, numbness, headaches and weakness.  Hematological:  Negative for swollen glands.  Psychiatric/Behavioral:  Positive for sleep disturbance. Negative for depressed mood. The patient is not nervous/anxious.     PMFS History:  Patient Active Problem List   Diagnosis Date Noted   Osteoporosis 06/04/2022   Hashimoto's thyroiditis 04/08/2022   Bronchitis 01/09/2022   H/O mastectomy, bilateral 11/29/2021   Malignant neoplasm of right breast in female, estrogen receptor positive (HCC) 10/14/2021   Insomnia 09/04/2021   Osteopenia 09/04/2021   Pulmonary nodule 03/29/2021   Rheumatoid arthritis (HCC) 12/02/2019   Anxiety and depression 08/01/2019   Arthralgia 05/04/2019   OSA (obstructive sleep apnea) 05/03/2019   Thyroiditis 12/20/2018   Muscle cramp 12/20/2018   Eustachian tube dysfunction 07/27/2017   Headache 11/25/2015   Acute bacterial sinusitis 03/26/2015   Hyperglycemia 04/06/2014   Hyperparathyroidism, primary (HCC) 07/12/2013   Hypercalcemia 12/16/2012   H/O tobacco use, presenting hazards to health 11/29/2012   Overactive bladder  05/31/2012   Elevated LFTs 05/31/2012   Obesity (BMI 30-39.9) 05/31/2012   Allergic rhinitis 05/31/2012   Preventative health care 02/04/2012   SUI (stress urinary incontinence, female) 02/04/2012   GERD (gastroesophageal reflux disease)    Rotator cuff syndrome of right shoulder  06/11/2011   Hypertension 01/03/2011   Hyperlipidemia 01/03/2011   KNEE PAIN, BILATERAL 09/28/2008   ANSERINE BURSITIS 09/28/2008   CAVUS DEFORMITY OF FOOT, ACQUIRED 09/28/2008    Past Medical History:  Diagnosis Date   Allergic rhinitis 05/31/2012   Breast cancer (HCC)    right breast, dx 10/2021.   Chest pain    PT SAW CARDIOLOGIST DR. Excell Seltzer - STRESS ECHO DONE NEGATIVE - NO PROBLEM SINCE   Chicken pox as achild   Cough 11/29/2012   RESOLVED - IT WAS CAUSED BY LISINOPRIL   Elevated LFTs 05/31/2012   Eustachian tube dysfunction 07/27/2017   GERD (gastroesophageal reflux disease)    H/O tobacco use, presenting hazards to health 11/29/2012   Smoked 1 1/2 ppd for 20 years quit in roughtly 2007   Headache 11/25/2015   Hematuria 04/09/2014   Hoarseness 02/04/2012   RESOLVED - THOUGHT TO HAVE BEEN ALLERY RELATED   Hyperglycemia 04/06/2014   Hyperlipidemia    Hyperparathyroidism (HCC)    Hypertension    Obesity, unspecified 05/31/2012   Ocular migraine    Overactive bladder 05/31/2012   Preventative health care 02/04/2012   PTSD (post-traumatic stress disorder)    Rapid heart beat    PT FEELS PROB RELATED TO HYPER PARATHYROIDISM   Rheumatoid arthritis (HCC)    SUI (stress urinary incontinence, female) 02/04/2012   Sees Dr Newt Minion and they have discussed a bladder tack but so far she declines    Family History  Problem Relation Age of Onset   Hypertension Mother 66       alive   Renal cancer Mother        renal   Hyperlipidemia Mother    Osteoporosis Mother        humerus    Macular degeneration Mother    Tremor Mother    Parkinson's disease Mother    Hypertension Father 41       alive   Atrial fibrillation Father    Colon cancer Father        colon   Hyperlipidemia Father    Stroke Father    Alzheimer's disease Father    Hypertension Sister    Hypertension Brother 83       alive   Tremor Brother    Asthma Son    Eczema Son    Healthy Son    Heart disease  Paternal Aunt    Heart disease Paternal Uncle    Lung cancer Maternal Grandfather        lung/ smoker   Emphysema Maternal Grandfather    Heart disease Paternal Grandmother    Other Paternal Grandfather        black lung   Heart disease Paternal Grandfather    Past Surgical History:  Procedure Laterality Date   BREAST EXCISIONAL BIOPSY Left    Age 61 Fibroadenoma   BREAST SURGERY     left breast excision of adenoma   btl     COLONOSCOPY  07/2020   FOOT SURGERY     MASTECTOMY Bilateral 11/28/2021   PARATHYROIDECTOMY Right 08/12/2013   Procedure: right inferior frozen section PARATHYROIDECTOMY;  Surgeon: Velora Heckler, MD;  Location: [facility];  Service: General;  Laterality: Right;  PELVIC LAPAROSCOPY  03/2004   TISSUE EXPANDER PLACEMENT Bilateral 08/14/2022   at [facility] REPLACEMENT Right 01/2019   TUBAL LIGATION     WISDOM TOOTH EXTRACTION  30 yrs ago   Social History   Social History Narrative   Not on file   Immunization History  Administered Date(s) Administered   Hepatitis A, Adult 09/04/2016, 03/27/2017   Influenza Split 12/09/2011   Influenza,inj,Quad PF,6+ Mos 11/29/2012, 11/01/2013, 11/20/2015, 12/29/2017, 04/03/2019, 12/13/2019, 03/28/2021   PFIZER(Purple Top)SARS-COV-2 Vaccination 05/20/2019, 06/15/2019, 01/11/2020   Pneumococcal Conjugate-13 03/28/2021   Pneumococcal Polysaccharide-23 08/01/2019   Tdap 06/07/2015   Zoster Recombinat (Shingrix) 08/01/2019, 10/03/2020     Objective: Vital Signs: BP 125/85 (BP Location: Left Arm, Patient Position: Sitting, Cuff Size: Normal)   Pulse 80   Resp 16   Ht 5\' 5"  (1.651 m)   Wt 204 lb 12.8 oz (92.9 kg)   LMP 04/01/2011   BMI 34.08 kg/m    Physical Exam Vitals and nursing note reviewed.  Constitutional:      Appearance: She is well-developed.  HENT:     Head: Normocephalic and atraumatic.  Eyes:     Conjunctiva/sclera: Conjunctivae normal.  Cardiovascular:     Rate and Rhythm: Normal  rate and regular rhythm.     Heart sounds: Normal heart sounds.  Pulmonary:     Effort: Pulmonary effort is normal.     Breath sounds: Normal breath sounds.  Abdominal:     General: Bowel sounds are normal.     Palpations: Abdomen is soft.  Musculoskeletal:     Cervical back: Normal range of motion.  Lymphadenopathy:     Cervical: No cervical adenopathy.  Skin:    General: Skin is warm and dry.     Capillary Refill: Capillary refill takes less than 2 seconds.  Neurological:     Mental Status: She is alert and oriented to person, place, and time.  Psychiatric:        Behavior: Behavior normal.      Musculoskeletal Exam: C-spine, thoracic spine, and lumbar spine good ROM.  Shoulder joints, elbow joints, wrist joints, MCPs, PIPs, and DIPs good ROM with no synovitis.  Tenderness of all MCP joints.  Complete fist formation bilaterally. PIP and DIP thickening consistent with OA of both hands.  Hip joints, knee joints, and ankle joints have good ROM with no discomfort.  No warmth or effusion of knee joints.  No tenderness or swelling of ankle joints. Tenderness of all MTP joints.  PIP and DIP thickening consistent with OA of both feet.  CDAI Exam: CDAI Score: -- Patient Global: 80 / 100; Provider Global: 50 / 100 Swollen: --; Tender: -- Joint Exam 09/04/2022   No joint exam has been documented for this visit   There is currently no information documented on the homunculus. Go to the Rheumatology activity and complete the homunculus joint exam.  Investigation: No additional findings.  Imaging: No results found.  Recent Labs: Lab Results  Component Value Date   WBC 6.7 06/03/2022   HGB 12.0 06/03/2022   PLT 323 06/03/2022   NA 137 06/03/2022   K 4.6 06/03/2022   CL 101 06/03/2022   CO2 30 06/03/2022   GLUCOSE 79 06/03/2022   BUN 19 06/03/2022   CREATININE 0.69 06/03/2022   BILITOT 0.3 06/03/2022   ALKPHOS 81 01/09/2022   AST 16 06/03/2022   ALT 16 06/03/2022   PROT 7.0  06/03/2022   ALBUMIN 3.9 01/09/2022   CALCIUM  9.1 06/12/2022   GFRAA 117 08/31/2020   QFTBGOLDPLUS NEGATIVE 08/26/2021    Speciality Comments: Orencia stoped 10/04/20 Rinvoq started 10/11/20  Procedures:  No procedures performed Allergies: Cefdinir, Doxycycline, Codeine, Penicillins, Simvastatin, and Other     Assessment / Plan:     Visit Diagnoses: Rheumatoid arthritis of multiple sites with negative rheumatoid factor [facility]): Patient presents today with increased joint pain and stiffness in both hands and both feet.  She has tenderness over all MCP and MTP joints.  She is also been experiencing intermittent discomfort in the right knee joint.  She recently had a gap in rinvoq for 1 month around the time of having a breast tissue expander placed on 08/14/2022.  She resumed taking Rinvoq this week.  Plan to restart Arava 20 mg 1 tablet daily.  A refill will be sent to the pharmacy today.  Discouraged the use of prednisone/corticosteroid injections given history of osteoporosis/recent sacral fracture.  Patient requested a prednisone taper today to alleviate her current symptoms since she currently rates her rheumatoid arthritis and 8 out of 10.  A short low-dose prednisone taper starting at 20 mg tapering by 5 mg every 2 days with sent to the pharmacy today.  Advised patient to avoid NSAIDs while taking prednisone.  Discouraged the use of prednisone in the future.  She is advised to notify us if she continues to have recurrent flares.  She will follow-up in the office in 3 months or sooner if needed.  High risk medication use - Rinvoq 15 mg 1 tablet by mouth daily.  Rinvoq added on at the end of July 2022-has been on hold since July 2023.  CBC and CMP updated on 06/03/22.  Orders for CBC and CMP were released today. TB gold negative on 08/26/21.  Order for TB gold released today. No recent or recurrent infections.  Discussed the importance of holding Rinvoq if she develops signs or symptoms of an  infection and to resume once the infection has completely cleared. - Plan: CBC with Differential/Platelet, COMPLETE METABOLIC PANEL WITH GFR, QuantiFERON-TB Gold Plus  Screening for tuberculosis - Order for TB gold released today.Plan: QuantiFERON-TB Gold Plus  Post-menopausal osteoporosis: DEXA updated on 09/09/21: The BMD measured at Forearm Radius 33% is 0.741 g/cm2 with a T-score of -1.5. Previous DEXA on 08/12/19:Radius 33% is 0.751 g/cm2 with a T-score of -1.4. Bilateral sacral alar fracture after stepping in a hole in January 2024.   History of left hip fracture-April 2024.  History of recurrent prednisone tapers and frequent cortisone injections. History of hyperparathyroidism--history of right parathyroidectomy 2015. History of Hashimoto's thyroiditis--remains on Synthroid. Previous smoker. History of GERD--not a good candidate for oral bisphosphonate therapy.  History of breast cancer--plan avoiding Tymlos/Forteo.  Plan to avoid Evenity given risk for major cardiovascular events while on Rinvoq. IV reclast administered 06/09/22-no adverse effects.  Vitamin D 37.61 on 06/12/22. She is taking vitamin D 5,000 twice weekly. She is taking a daily calcium supplement.  Discussed the importance of trying to avoid corticosteroid use. Due to update DEXA in July 2025.   Vitamin D deficiency: Vitamin D was 49 on 06/03/2022.  Vitamin D was 37.6 on 06/12/2022.  She is taking vitamin D 5000 units twice weekly.  Status post total shoulder replacement, right: Doing well.  Good range of motion on examination today.  Primary osteoarthritis of both hands: Patient presents today with significant pain and stiffness in both hands.  She has tenderness of all MCP joints.  Patient requested a prednisone  taper to be sent to the pharmacy today.  She will be resuming Arava as combination therapy with rinvoq.  Primary osteoarthritis of both feet: PIP and DIP thickening consistent with osteoarthritis of both feet.  Under  care of Dr. Guilford Shi injections every 3 months.  Tenderness over all MTP joints today.    History of breast cancer: status post bilateral mastectomy and tissue expander placement. She is undergoing breast tissue expansion. Under the care of [facility] plastic surgery.   Pes cavus of both feet  Other medical conditions are listed as follows:  Anxiety and depression  Hyperparathyroidism, primary [facility]): Recently reevaluated by Dr. Sharl Ma  Essential hypertension: Blood pressure is 125/85 today in the office.  Hashimoto's thyroiditis  History of miscarriage  History of gastroesophageal reflux (GERD)  History of hyperlipidemia  Hx of migraines  Former smoker  Prediabetes - Dr. Kerr-endocrinology  Orders: Orders Placed This Encounter  Procedures   CBC with Differential/Platelet   COMPLETE METABOLIC PANEL WITH GFR   QuantiFERON-TB Gold Plus   Meds ordered this encounter  Medications   predniSONE (DELTASONE) 5 MG tablet    Sig: Take 4 tablets by mouth daily x2 days, 3 tablets daily x2 days, 2 tablets daily x2 days, 1 tablet daily x2 days.    Dispense:  20 tablet    Refill:  0   Follow-Up Instructions: Return in 3 months (on 12/05/2022) for Rheumatoid arthritis.   Gearldine Bienenstock, PA-C  Note - This record has been created using Dragon software.  Chart creation errors have been sought, but may not always  have been located. Such creation errors do not reflect on  the standard of medical care.

## 2022-08-22 ENCOUNTER — Other Ambulatory Visit (HOSPITAL_COMMUNITY): Payer: Self-pay

## 2022-08-25 DIAGNOSIS — M25551 Pain in right hip: Secondary | ICD-10-CM | POA: Diagnosis not present

## 2022-08-25 DIAGNOSIS — M160 Bilateral primary osteoarthritis of hip: Secondary | ICD-10-CM | POA: Diagnosis not present

## 2022-08-26 DIAGNOSIS — Z421 Encounter for breast reconstruction following mastectomy: Secondary | ICD-10-CM | POA: Diagnosis not present

## 2022-09-02 DIAGNOSIS — Z421 Encounter for breast reconstruction following mastectomy: Secondary | ICD-10-CM | POA: Diagnosis not present

## 2022-09-04 ENCOUNTER — Other Ambulatory Visit: Payer: Self-pay

## 2022-09-04 ENCOUNTER — Encounter: Payer: Self-pay | Admitting: Physician Assistant

## 2022-09-04 ENCOUNTER — Ambulatory Visit: Payer: BLUE CROSS/BLUE SHIELD | Attending: Physician Assistant | Admitting: Physician Assistant

## 2022-09-04 ENCOUNTER — Other Ambulatory Visit (HOSPITAL_COMMUNITY): Payer: Self-pay

## 2022-09-04 VITALS — BP 125/85 | HR 80 | Resp 16 | Ht 65.0 in | Wt 204.8 lb

## 2022-09-04 DIAGNOSIS — M810 Age-related osteoporosis without current pathological fracture: Secondary | ICD-10-CM

## 2022-09-04 DIAGNOSIS — M19071 Primary osteoarthritis, right ankle and foot: Secondary | ICD-10-CM

## 2022-09-04 DIAGNOSIS — Q6672 Congenital pes cavus, left foot: Secondary | ICD-10-CM

## 2022-09-04 DIAGNOSIS — M19072 Primary osteoarthritis, left ankle and foot: Secondary | ICD-10-CM

## 2022-09-04 DIAGNOSIS — R7303 Prediabetes: Secondary | ICD-10-CM

## 2022-09-04 DIAGNOSIS — Z111 Encounter for screening for respiratory tuberculosis: Secondary | ICD-10-CM

## 2022-09-04 DIAGNOSIS — E063 Autoimmune thyroiditis: Secondary | ICD-10-CM

## 2022-09-04 DIAGNOSIS — Z79899 Other long term (current) drug therapy: Secondary | ICD-10-CM | POA: Diagnosis not present

## 2022-09-04 DIAGNOSIS — Z8669 Personal history of other diseases of the nervous system and sense organs: Secondary | ICD-10-CM

## 2022-09-04 DIAGNOSIS — E210 Primary hyperparathyroidism: Secondary | ICD-10-CM

## 2022-09-04 DIAGNOSIS — E559 Vitamin D deficiency, unspecified: Secondary | ICD-10-CM

## 2022-09-04 DIAGNOSIS — M19042 Primary osteoarthritis, left hand: Secondary | ICD-10-CM

## 2022-09-04 DIAGNOSIS — M0609 Rheumatoid arthritis without rheumatoid factor, multiple sites: Secondary | ICD-10-CM

## 2022-09-04 DIAGNOSIS — M8589 Other specified disorders of bone density and structure, multiple sites: Secondary | ICD-10-CM

## 2022-09-04 DIAGNOSIS — Z8719 Personal history of other diseases of the digestive system: Secondary | ICD-10-CM

## 2022-09-04 DIAGNOSIS — Z853 Personal history of malignant neoplasm of breast: Secondary | ICD-10-CM

## 2022-09-04 DIAGNOSIS — F32A Depression, unspecified: Secondary | ICD-10-CM

## 2022-09-04 DIAGNOSIS — M19041 Primary osteoarthritis, right hand: Secondary | ICD-10-CM

## 2022-09-04 DIAGNOSIS — Z96611 Presence of right artificial shoulder joint: Secondary | ICD-10-CM

## 2022-09-04 DIAGNOSIS — Q6671 Congenital pes cavus, right foot: Secondary | ICD-10-CM

## 2022-09-04 DIAGNOSIS — I10 Essential (primary) hypertension: Secondary | ICD-10-CM

## 2022-09-04 DIAGNOSIS — Z8759 Personal history of other complications of pregnancy, childbirth and the puerperium: Secondary | ICD-10-CM

## 2022-09-04 DIAGNOSIS — Z87891 Personal history of nicotine dependence: Secondary | ICD-10-CM

## 2022-09-04 DIAGNOSIS — Z8639 Personal history of other endocrine, nutritional and metabolic disease: Secondary | ICD-10-CM

## 2022-09-04 DIAGNOSIS — F419 Anxiety disorder, unspecified: Secondary | ICD-10-CM

## 2022-09-04 LAB — CBC WITH DIFFERENTIAL/PLATELET
Basophils Absolute: 42 cells/uL (ref 0–200)
Basophils Relative: 0.5 %
Hemoglobin: 12.7 g/dL (ref 11.7–15.5)
Lymphs Abs: 2419 cells/uL (ref 850–3900)
MCV: 89.7 fL (ref 80.0–100.0)
RBC: 4.37 10*6/uL (ref 3.80–5.10)

## 2022-09-04 MED ORDER — RINVOQ 15 MG PO TB24
15.0000 mg | ORAL_TABLET | Freq: Every day | ORAL | 2 refills | Status: DC
  Filled 2022-09-04 – 2022-09-17 (×2): qty 30, 30d supply, fill #0
  Filled 2022-10-20: qty 30, 30d supply, fill #1
  Filled 2022-11-19 (×2): qty 30, 30d supply, fill #2

## 2022-09-04 MED ORDER — PREDNISONE 5 MG PO TABS
ORAL_TABLET | ORAL | 0 refills | Status: DC
Start: 2022-09-04 — End: 2022-10-08

## 2022-09-04 MED ORDER — LEFLUNOMIDE 20 MG PO TABS: 20.0000 mg | ORAL_TABLET | Freq: Every day | ORAL | 0 refills | Status: DC

## 2022-09-04 NOTE — Telephone Encounter (Signed)
Please review pended refills for Rinvow and Arava 20mg . Thanks!

## 2022-09-04 NOTE — Patient Instructions (Signed)
Standing Labs We placed an order today for your standing lab work.   Please have your standing labs drawn in September and every 3 months   Please have your labs drawn 2 weeks prior to your appointment so that the provider can discuss your lab results at your appointment, if possible.  Please note that you may see your imaging and lab results in MyChart before we have reviewed them. We will contact you once all results are reviewed. Please allow our office up to 72 hours to thoroughly review all of the results before contacting the office for clarification of your results.  WALK-IN LAB HOURS  Monday through Thursday from 8:00 am -12:30 pm and 1:00 pm-5:00 pm and Friday from 8:00 am-12:00 pm.  Patients with office visits requiring labs will be seen before walk-in labs.  You may encounter longer than normal wait times. Please allow additional time. Wait times may be shorter on  Monday and Thursday afternoons.  We do not book appointments for walk-in labs. We appreciate your patience and understanding with our staff.   Labs are drawn by [facility]. Please bring your co-pay at the time of your lab draw.  You may receive a bill from [facility] for your lab work.  Please note if you are on Hydroxychloroquine and and an order has been placed for a Hydroxychloroquine level,  you will need to have it drawn 4 hours or more after your last dose.  If you wish to have your labs drawn at another location, please call the office 24 hours in advance so we can fax the orders.  The office is located at [address], [address], [place], [place] [address]   If you have any questions regarding directions or hours of operation,  please call [phone].   As a reminder, please drink plenty of water prior to coming for your lab work. Thanks!  

## 2022-09-05 LAB — COMPLETE METABOLIC PANEL WITH GFR
AG Ratio: 1.7 (calc) (ref 1.0–2.5)
ALT: 18 U/L (ref 6–29)
AST: 18 U/L (ref 10–35)
BUN: 19 mg/dL (ref 7–25)
CO2: 26 mmol/L (ref 20–32)
Sodium: 137 mmol/L (ref 135–146)
Total Bilirubin: 0.3 mg/dL (ref 0.2–1.2)
Total Protein: 7 g/dL (ref 6.1–8.1)

## 2022-09-05 LAB — CBC WITH DIFFERENTIAL/PLATELET
MCHC: 32.4 g/dL (ref 32.0–36.0)
Monocytes Relative: 8.9 %
Platelets: 397 10*3/uL (ref 140–400)
Total Lymphocyte: 28.8 %
WBC: 8.4 10*3/uL (ref 3.8–10.8)

## 2022-09-05 NOTE — Progress Notes (Signed)
Glucose 114. Rest of CMP WNL. CBC WNL

## 2022-09-06 LAB — COMPLETE METABOLIC PANEL WITH GFR
Albumin: 4.4 g/dL (ref 3.6–5.1)
Alkaline phosphatase (APISO): 87 U/L (ref 37–153)
Calcium: 10.1 mg/dL (ref 8.6–10.4)
Chloride: 100 mmol/L (ref 98–110)
Creat: 0.73 mg/dL (ref 0.50–1.05)
Globulin: 2.6 g/dL (calc) (ref 1.9–3.7)
Glucose, Bld: 114 mg/dL — ABNORMAL HIGH (ref 65–99)
Potassium: 5.3 mmol/L (ref 3.5–5.3)
eGFR: 94 mL/min/{1.73_m2} (ref >=60)

## 2022-09-06 LAB — CBC WITH DIFFERENTIAL/PLATELET
Absolute Monocytes: 748 cells/uL (ref 200–950)
Eosinophils Absolute: 76 cells/uL (ref 15–500)
Eosinophils Relative: 0.9 %
HCT: 39.2 % (ref 35.0–45.0)
MCH: 29.1 pg (ref 27.0–33.0)
MPV: 9.5 fL (ref 7.5–12.5)
Neutro Abs: 5116 cells/uL (ref 1500–7800)
Neutrophils Relative %: 60.9 %
RDW: 14.9 % (ref 11.0–15.0)

## 2022-09-06 LAB — QUANTIFERON-TB GOLD PLUS
Mitogen-NIL: >10 IU/mL
NIL: 0.03 IU/mL
QuantiFERON-TB Gold Plus: NEGATIVE
TB1-NIL: 0 IU/mL
TB2-NIL: 0 IU/mL

## 2022-09-07 ENCOUNTER — Other Ambulatory Visit: Payer: Self-pay | Admitting: Family Medicine

## 2022-09-07 DIAGNOSIS — F419 Anxiety disorder, unspecified: Secondary | ICD-10-CM

## 2022-09-08 DIAGNOSIS — F4321 Adjustment disorder with depressed mood: Secondary | ICD-10-CM | POA: Diagnosis not present

## 2022-09-08 NOTE — Progress Notes (Signed)
TB gold negative

## 2022-09-10 ENCOUNTER — Ambulatory Visit (INDEPENDENT_AMBULATORY_CARE_PROVIDER_SITE_OTHER): Payer: BLUE CROSS/BLUE SHIELD | Admitting: Podiatry

## 2022-09-10 DIAGNOSIS — M19071 Primary osteoarthritis, right ankle and foot: Secondary | ICD-10-CM

## 2022-09-10 DIAGNOSIS — M19072 Primary osteoarthritis, left ankle and foot: Secondary | ICD-10-CM | POA: Diagnosis not present

## 2022-09-10 MED ORDER — BETAMETHASONE SOD PHOS & ACET 6 (3-3) MG/ML IJ SUSP
3.0000 mg | Freq: Once | INTRAMUSCULAR | Status: AC
Start: 2022-09-10 — End: 2022-09-10
  Administered 2022-09-10: 3 mg via INTRA_ARTICULAR

## 2022-09-10 NOTE — Progress Notes (Signed)
Chief Complaint  Patient presents with   Arthritis    B/L injections    Subjective:  62 year old female PMHx rheumatoid arthritis presenting today for follow-up management of chronic bilateral midtarsal pain and arthritis.  She is being seen by rheumatology.  Past Medical History:  Diagnosis Date   Allergic rhinitis 05/31/2012   Breast cancer (HCC)    right breast, dx 10/2021.   Chest pain    PT SAW CARDIOLOGIST DR. Excell Seltzer - STRESS ECHO DONE NEGATIVE - NO PROBLEM SINCE   Chicken pox as achild   Cough 11/29/2012   RESOLVED - IT WAS CAUSED BY LISINOPRIL   Elevated LFTs 05/31/2012   Eustachian tube dysfunction 07/27/2017   GERD (gastroesophageal reflux disease)    H/O tobacco use, presenting hazards to health 11/29/2012   Smoked 1 1/2 ppd for 20 years quit in roughtly 2007   Headache 11/25/2015   Hematuria 04/09/2014   Hoarseness 02/04/2012   RESOLVED - THOUGHT TO HAVE BEEN ALLERY RELATED   Hyperglycemia 04/06/2014   Hyperlipidemia    Hyperparathyroidism (HCC)    Hypertension    Obesity, unspecified 05/31/2012   Ocular migraine    Overactive bladder 05/31/2012   Preventative health care 02/04/2012   PTSD (post-traumatic stress disorder)    Rapid heart beat    PT FEELS PROB RELATED TO HYPER PARATHYROIDISM   Rheumatoid arthritis (HCC)    SUI (stress urinary incontinence, female) 02/04/2012   Sees Dr Newt Minion and they have discussed a bladder tack but so far she declines   Past Surgical History:  Procedure Laterality Date   BREAST EXCISIONAL BIOPSY Left    Age 69 Fibroadenoma   BREAST SURGERY     left breast excision of adenoma   btl     COLONOSCOPY  07/2020   FOOT SURGERY     MASTECTOMY Bilateral 11/28/2021   PARATHYROIDECTOMY Right 08/12/2013   Procedure: right inferior frozen section PARATHYROIDECTOMY;  Surgeon: Velora Heckler, MD;  Location: [facility];  Service: General;  Laterality: Right;   PELVIC LAPAROSCOPY  03/2004   TISSUE EXPANDER PLACEMENT Bilateral  08/14/2022   at [facility] REPLACEMENT Right 01/2019   TUBAL LIGATION     WISDOM TOOTH EXTRACTION  30 yrs ago   Allergies  Allergen Reactions   Cefdinir Other (See Comments) and Nausea And Vomiting    Severe Stomach cramps and diarrhea for several days.   Doxycycline Diarrhea   Codeine Nausea And Vomiting   Penicillins     RASH, ITCHING   Simvastatin     myalgias   Other Rash   Objective: Physical Exam General: The patient is alert and oriented x3 in no acute distress.  Dermatology: Skin is cool, dry and supple bilateral lower extremities. Negative for open lesions or macerations.  Vascular: Palpable pedal pulses bilaterally.  Capillary refill WNL.  Clinically no concern for vascular compromise  Neurological: Grossly intact via light touch  Musculoskeletal Exam: Chronic pain on palpation throughout the midtarsal joint bilateral  Radiographic exam B/L feet 04/03/2021 Normal osseous mineralization.  There is some degenerative changes noted throughout the lesser TMT joints consistent with the patient's given history of rheumatoid and osteoarthritis.  No acute fractures identified.  Assessment: 1. s/p excision of benign skin lesion left plantar foot. DOS: 06/13/2021 2. Chronic bilateral midfoot capsulitis/arthritis  Plan of Care:  1. Patient was evaluated.   2.  Injection of 0.5 cc Celestone Soluspan injection of the bilateral midtarsal joint 3.  Continue close management with  rheumatology 4.  Continue custom molded orthotics 5.  Return to clinic as needed, or about 3 months for follow-up care  *Going to [place] and [place] in October for [place]th Anniversary  Felecia Shelling, DPM [facility]  Dr. Felecia Shelling, DPM    [address] [place], [place] [address]                Office [phone]  Fax [phone]

## 2022-09-14 ENCOUNTER — Other Ambulatory Visit: Payer: Self-pay | Admitting: Family Medicine

## 2022-09-16 DIAGNOSIS — Z421 Encounter for breast reconstruction following mastectomy: Secondary | ICD-10-CM | POA: Diagnosis not present

## 2022-09-17 ENCOUNTER — Other Ambulatory Visit: Payer: Self-pay

## 2022-09-17 ENCOUNTER — Other Ambulatory Visit (HOSPITAL_COMMUNITY): Payer: Self-pay

## 2022-09-18 DIAGNOSIS — M25561 Pain in right knee: Secondary | ICD-10-CM | POA: Diagnosis not present

## 2022-09-18 DIAGNOSIS — M1711 Unilateral primary osteoarthritis, right knee: Secondary | ICD-10-CM | POA: Diagnosis not present

## 2022-09-22 ENCOUNTER — Other Ambulatory Visit (HOSPITAL_COMMUNITY): Payer: Self-pay

## 2022-09-23 DIAGNOSIS — Z421 Encounter for breast reconstruction following mastectomy: Secondary | ICD-10-CM | POA: Diagnosis not present

## 2022-09-24 NOTE — Progress Notes (Deleted)
No charge. 

## 2022-10-01 NOTE — Progress Notes (Unsigned)
error 

## 2022-10-05 NOTE — Assessment & Plan Note (Signed)
small bowel obstruction

## 2022-10-05 NOTE — Assessment & Plan Note (Signed)
Encourage heart healthy diet such as MIND or DASH diet, increase exercise, avoid trans fats, simple carbohydrates and processed foods, consider a krill or fish or flaxseed oil cap daily.  °

## 2022-10-05 NOTE — Assessment & Plan Note (Signed)
Hydrate and monitor 

## 2022-10-05 NOTE — Assessment & Plan Note (Signed)
hgba1c acceptable, minimize simple carbs. Increase exercise as tolerated.  

## 2022-10-05 NOTE — Assessment & Plan Note (Addendum)
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

## 2022-10-05 NOTE — Progress Notes (Unsigned)
Subjective:    Patient ID: Sandra [name], female    DOB: 12-Aug-1960, 62 y.o.   MRN: [id number]  No chief complaint on file.   HPI Discussed the use of AI scribe software for clinical note transcription with the patient, who gave verbal consent to proceed.  History of Present Illness   The patient, with a history of Rheumatoid Arthritis (RA), presents with a concern about a nodule in front of the right ear noticed about a month ago. The patient reports no associated symptoms such as redness, tenderness, ear pain, congestion, or sore throat. The patient also mentions elevated blood sugar levels, which they attribute to steroid therapy for RA. They express reluctance to start metformin, a medication suggested by their endocrinologist. The patient also reports a history of sacral fractures and a torn lateral meniscus in the right knee, which have limited their ability to exercise. They have been managing their health through a Mediterranean diet. The patient is also in the process of breast reconstruction following cancer treatment, with tissue expanders currently in place and plans for implant surgery by the end of the year.    Patient is a 62 yo female in today for follow up on chronic medical concerns. No recent febrile illness or hospitalizations. Denies CP/palp/SOB/HA/congestion/fevers/GI or GU c/o. Taking meds as prescribed  Is considering returning to work in January.    Past Medical History:  Diagnosis Date   Allergic rhinitis 05/31/2012   Breast cancer (HCC)    right breast, dx 10/2021.   Chest pain    PT SAW CARDIOLOGIST DR. Excell Seltzer - STRESS ECHO DONE NEGATIVE - NO PROBLEM SINCE   Chicken pox as achild   Cough 11/29/2012   RESOLVED - IT WAS CAUSED BY LISINOPRIL   Elevated LFTs 05/31/2012   Eustachian tube dysfunction 07/27/2017   GERD (gastroesophageal reflux disease)    H/O tobacco use, presenting hazards to health 11/29/2012   Smoked 1 1/2 ppd for 20 years quit in roughtly  2007   Headache 11/25/2015   Hematuria 04/09/2014   Hoarseness 02/04/2012   RESOLVED - THOUGHT TO HAVE BEEN ALLERY RELATED   Hyperglycemia 04/06/2014   Hyperlipidemia    Hyperparathyroidism (HCC)    Hypertension    Obesity, unspecified 05/31/2012   Ocular migraine    Overactive bladder 05/31/2012   Preventative health care 02/04/2012   PTSD (post-traumatic stress disorder)    Rapid heart beat    PT FEELS PROB RELATED TO HYPER PARATHYROIDISM   Rheumatoid arthritis (HCC)    SUI (stress urinary incontinence, female) 02/04/2012   Sees Dr Newt Minion and they have discussed a bladder tack but so far she declines    Past Surgical History:  Procedure Laterality Date   BREAST EXCISIONAL BIOPSY Left    Age 47 Fibroadenoma   BREAST SURGERY     left breast excision of adenoma   btl     COLONOSCOPY  07/2020   FOOT SURGERY     MASTECTOMY Bilateral 11/28/2021   PARATHYROIDECTOMY Right 08/12/2013   Procedure: right inferior frozen section PARATHYROIDECTOMY;  Surgeon: Velora Heckler, MD;  Location: [facility];  Service: General;  Laterality: Right;   PELVIC LAPAROSCOPY  03/2004   TISSUE EXPANDER PLACEMENT Bilateral 08/14/2022   at [facility] REPLACEMENT Right 01/2019   TUBAL LIGATION     WISDOM TOOTH EXTRACTION  30 yrs ago    Family History  Problem Relation Age of Onset   Hypertension Mother 30  alive   Renal cancer Mother        renal   Hyperlipidemia Mother    Osteoporosis Mother        humerus    Macular degeneration Mother    Tremor Mother    Parkinson's disease Mother    Hypertension Father 66       alive   Atrial fibrillation Father    Colon cancer Father        colon   Hyperlipidemia Father    Stroke Father    Alzheimer's disease Father    Hypertension Sister    Hypertension Brother 22       alive   Tremor Brother    Asthma Son    Eczema Son    Healthy Son    Heart disease Paternal Aunt    Heart disease Paternal Uncle    Lung cancer Maternal  Grandfather        lung/ smoker   Emphysema Maternal Grandfather    Heart disease Paternal Grandmother    Other Paternal Grandfather        black lung   Heart disease Paternal Grandfather     Social History   Socioeconomic History   Marital status: Married    Spouse name: Not on file   Number of children: 2   Years of education: Not on file   Highest education level: Associate degree: academic program  Occupational History   Occupation: Teacher, adult education: Advertising copywriter  Tobacco Use   Smoking status: Former    Current packs/day: 0.00    Average packs/day: 1 pack/day for 20.0 years (20.0 ttl pk-yrs)    Types: Cigarettes    Start date: 03/10/1985    Quit date: 03/10/2005    Years since quitting: 17.5    Passive exposure: Never   Smokeless tobacco: Never  Vaping Use   Vaping status: Never Used  Substance and Sexual Activity   Alcohol use: Not Currently   Drug use: No   Sexual activity: Yes    Partners: Male  Other Topics Concern   Not on file  Social History Narrative   Not on file   Social Determinants of Health   Financial Resource Strain: Low Risk  (09/29/2022)   Overall Financial Resource Strain (CARDIA)    Difficulty of Paying Living Expenses: Not hard at all  Food Insecurity: No Food Insecurity (09/29/2022)   Hunger Vital Sign    Worried About Running Out of Food in the Last Year: Never true    Ran Out of Food in the Last Year: Never true  Transportation Needs: No Transportation Needs (09/29/2022)   PRAPARE - Administrator, Civil Service (Medical): No    Lack of Transportation (Non-Medical): No  Physical Activity: Unknown (09/29/2022)   Exercise Vital Sign    Days of Exercise per Week: 0 days    Minutes of Exercise per Session: Not on file  Stress: No Stress Concern Present (09/29/2022)   [employer] of Occupational Health - Occupational Stress Questionnaire    Feeling of Stress : Not at all  Social Connections: Moderately Integrated  (09/29/2022)   Social Connection and Isolation Panel [NHANES]    Frequency of Communication with Friends and Family: More than three times a week    Frequency of Social Gatherings with Friends and Family: Once a week    Attends Religious Services: More than 4 times per year    Active Member of Clubs or Organizations: No  Attends Banker Meetings: Not on file    Marital Status: Married  Intimate Partner Violence: Unknown (06/12/2021)   Received from [facility]   HITS    Physically Hurt: Not on file    Insult or Talk Down To: Not on file    Threaten Physical Harm: Not on file    Scream or Curse: Not on file    Outpatient Medications Prior to Visit  Medication Sig Dispense Refill   ALPRAZolam (XANAX) 0.25 MG tablet Take 1 tablet (0.25 mg total) by mouth daily as needed. 15 tablet 0   aspirin 325 MG tablet Take 325 mg by mouth daily.     atorvastatin (LIPITOR) 80 MG tablet TAKE 1 TABLET BY MOUTH DAILY. 90 tablet 1   Cholecalciferol (VITAMIN D3) 125 MCG (5000 UT) CAPS Take by mouth 2 (two) times a week.     diclofenac (VOLTAREN) 75 MG EC tablet 1 tablet by mouth every 12 hours as needed with food     escitalopram (LEXAPRO) 10 MG tablet Take 1 tablet (10 mg total) by mouth daily. 90 tablet 1   ezetimibe (ZETIA) 10 MG tablet Take 1 tablet (10 mg total) by mouth daily. 90 tablet 1   furosemide (LASIX) 20 MG tablet TAKE 1 TABLET BY MOUTH EVERY DAY 90 tablet 1   irbesartan (AVAPRO) 150 MG tablet TAKE 1 TABLET BY MOUTH EVERY DAY 90 tablet 1   lactobacillus acidophilus (BACID) TABS tablet Take 2 tablets by mouth 3 (three) times daily.     leflunomide (ARAVA) 20 MG tablet Take 1 tablet (20 mg total) by mouth daily. 90 tablet 0   levothyroxine (SYNTHROID) 75 MCG tablet TAKE 1 TABLET BY MOUTH DAILY BEFORE BREAKFAST. 90 tablet 1   methocarbamol (ROBAXIN) 500 MG tablet Take 1 tablet (500 mg total) by mouth 2 (two) times daily. 20 tablet 0   metoprolol succinate (TOPROL-XL) 25 MG 24 hr  tablet TAKE 1 TABLET (25 MG TOTAL) BY MOUTH DAILY. 90 tablet 1   Metoprolol Succinate 25 MG CS24      omeprazole (PRILOSEC) 40 MG capsule TAKE 1 CAPSULE BY MOUTH EVERY DAY 90 capsule 1   predniSONE (DELTASONE) 5 MG tablet Take 4 tablets by mouth daily x2 days, 3 tablets daily x2 days, 2 tablets daily x2 days, 1 tablet daily x2 days. 20 tablet 0   Trospium Chloride 60 MG CP24 trospium ER 60 mg capsule,extended release 24 hr  TAKE 1 CAPSULE BY MOUTH EVERY DAY     TURMERIC PO Take 1 tablet by mouth 2 (two) times daily.     Upadacitinib ER (RINVOQ) 15 MG TB24 Take 1 tablet (15 mg total) by mouth daily. 30 tablet 2   verapamil (CALAN-SR) 120 MG CR tablet TAKE 1 TABLET BY MOUTH EVERYDAY AT BEDTIME 90 tablet 0   HYDROcodone-acetaminophen (NORCO/VICODIN) 5-325 MG tablet Take 1 tablet by mouth every 6 (six) hours as needed.     No facility-administered medications prior to visit.    Allergies  Allergen Reactions   Cefdinir Other (See Comments) and Nausea And Vomiting    Severe Stomach cramps and diarrhea for several days.   Doxycycline Diarrhea   Codeine Nausea And Vomiting   Penicillins     RASH, ITCHING   Simvastatin     myalgias   Other Rash    Review of Systems  Constitutional:  Negative for fever and malaise/fatigue.  HENT:  Negative for congestion.   Eyes:  Negative for blurred vision.  Respiratory:  Negative for shortness of breath.   Cardiovascular:  Negative for chest pain, palpitations and leg swelling.  Gastrointestinal:  Negative for abdominal pain, blood in stool and nausea.  Genitourinary:  Negative for dysuria and frequency.  Musculoskeletal:  Positive for joint pain. Negative for falls.  Skin:  Negative for rash.  Neurological:  Negative for dizziness, loss of consciousness and headaches.  Endo/Heme/Allergies:  Negative for environmental allergies.  Psychiatric/Behavioral:  Negative for depression. The patient is not nervous/anxious.        Objective:    Physical  Exam Constitutional:      General: She is not in acute distress.    Appearance: Normal appearance. She is well-developed. She is not toxic-appearing.  HENT:     Head: Normocephalic and atraumatic.     Right Ear: External ear normal.     Left Ear: External ear normal.     Nose: Nose normal.  Eyes:     General:        Right eye: No discharge.        Left eye: No discharge.     Conjunctiva/sclera: Conjunctivae normal.  Neck:     Thyroid: No thyromegaly.  Cardiovascular:     Rate and Rhythm: Normal rate and regular rhythm.     Heart sounds: Normal heart sounds. No murmur heard. Pulmonary:     Effort: Pulmonary effort is normal. No respiratory distress.     Breath sounds: Normal breath sounds.  Abdominal:     General: Bowel sounds are normal.     Palpations: Abdomen is soft.     Tenderness: There is no abdominal tenderness. There is no guarding.  Musculoskeletal:        General: Normal range of motion.     Cervical back: Neck supple.  Lymphadenopathy:     Cervical: No cervical adenopathy.  Skin:    General: Skin is warm and dry.  Neurological:     Mental Status: She is alert and oriented to person, place, and time.  Psychiatric:        Mood and Affect: Mood normal.        Behavior: Behavior normal.        Thought Content: Thought content normal.        Judgment: Judgment normal.     BP 124/80 (BP Location: Left Arm, Patient Position: Sitting, Cuff Size: Normal)   Pulse 96   Temp 98 F (36.7 C) (Oral)   Resp 16   Ht 5\' 5"  (1.651 m)   Wt 206 lb (93.4 kg)   LMP 04/01/2011   SpO2 97%   BMI 34.28 kg/m  Wt Readings from Last 3 Encounters:  10/06/22 206 lb (93.4 kg)  09/04/22 204 lb 12.8 oz (92.9 kg)  06/09/22 204 lb 3.2 oz (92.6 kg)    Diabetic Foot Exam - Simple   No data filed    Lab Results  Component Value Date   WBC 8.4 09/04/2022   HGB 12.7 09/04/2022   HCT 39.2 09/04/2022   PLT 397 09/04/2022   GLUCOSE 114 (H) 09/04/2022   CHOL 250 (H) 06/12/2022    TRIG 136.0 06/12/2022   HDL 75.60 06/12/2022   LDLDIRECT 179.0 09/03/2021   LDLCALC 147 (H) 06/12/2022   ALT 18 09/04/2022   AST 18 09/04/2022   NA 137 09/04/2022   K 5.3 09/04/2022   CL 100 09/04/2022   CREATININE 0.73 09/04/2022   BUN 19 09/04/2022   CO2 26 09/04/2022   TSH 0.57 06/12/2022  HGBA1C 6.4 06/12/2022    Lab Results  Component Value Date   TSH 0.57 06/12/2022   Lab Results  Component Value Date   WBC 8.4 09/04/2022   HGB 12.7 09/04/2022   HCT 39.2 09/04/2022   MCV 89.7 09/04/2022   PLT 397 09/04/2022   Lab Results  Component Value Date   NA 137 09/04/2022   K 5.3 09/04/2022   CO2 26 09/04/2022   GLUCOSE 114 (H) 09/04/2022   BUN 19 09/04/2022   CREATININE 0.73 09/04/2022   BILITOT 0.3 09/04/2022   ALKPHOS 81 01/09/2022   AST 18 09/04/2022   ALT 18 09/04/2022   PROT 7.0 09/04/2022   ALBUMIN 3.9 01/09/2022   CALCIUM 10.1 09/04/2022   ANIONGAP 10 11/01/2020   EGFR 94 09/04/2022   GFR 102.76 01/09/2022   Lab Results  Component Value Date   CHOL 250 (H) 06/12/2022   Lab Results  Component Value Date   HDL 75.60 06/12/2022   Lab Results  Component Value Date   LDLCALC 147 (H) 06/12/2022   Lab Results  Component Value Date   TRIG 136.0 06/12/2022   Lab Results  Component Value Date   CHOLHDL 3 06/12/2022   Lab Results  Component Value Date   HGBA1C 6.4 06/12/2022       Assessment & Plan:  Nodule of soft tissue Assessment & Plan: Very small, 1/cm, nontender and some tight muscles palpated in region. Likely benign and reactive just anterior to right ear. Report if persists. 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 Increase exercise as  tolerated.   Orders: -     Hemoglobin A1c  Mixed hyperlipidemia Assessment & Plan: Encourage heart healthy diet such as MIND or DASH diet, increase exercise, avoid trans fats, simple carbohydrates and processed foods, consider a krill or fish or flaxseed oil cap daily.    Orders: -     Lipid panel  Primary hypertension Assessment & Plan: Well controlled, no changes to meds. Encouraged heart healthy diet such as the DASH diet and exercise as tolerated.   Orders: -     Comprehensive metabolic panel -     TSH  Muscle cramp Assessment & Plan: Hydrate and monitor    Osteoporosis, unspecified osteoporosis type, unspecified pathological fracture presence Assessment & Plan: small bowel obstruction    Orders: -     VITAMIN D 25 Hydroxy (Vit-D Deficiency, Fractures)  High risk medication use -     Drug Monitoring Panel [id number] , Urine    Assessment and Plan    Rheumatoid Arthritis (RA): Patient is considering returning to work in a remote capacity due to limitations from RA and associated conditions. -Continue current management plan.  Sacral Fracture: Healing slowly, patient is unable to exercise due to this and a torn meniscus. -Continue current management plan.  Torn Meniscus (Right Knee, Lateral): Recently injected and patient reports improvement. -Continue current management plan.  Prediabetes: Recent A1c was 6.4. Patient is hesitant to start Metformin due to recent steroid use and potential impact on blood sugar. Patient prefers to recheck A1c in December. -Consider starting Metformin 500mg  daily to protect kidneys and level out blood sugar. -Check A1c today to determine if patient qualifies for  Ozempic or Mounjaro for weight loss.  Hyperlipidemia: Patient is currently on Zetia and Atorvastatin. Cholesterol has not been checked in six months. -Check lipid panel and CMP today. -Consider adding Fenofibrate if cholesterol is not controlled.  Nodule behind Right Ear:  Patient noticed a month ago, no change in size or associated symptoms. Likely related to muscle tension. -Advise patient to apply lidocaine and massage area daily for a couple of weeks. -Consider imaging if nodule does not resolve or becomes more symptomatic.  Breast Reconstruction: Patient has tissue expanders in place and is planning for implant surgery before the end of the year. -No specific plan needed at this time.  Follow-up in 3-4 months to repeat blood work and monitor overall health.         Danise Edge, MD

## 2022-10-05 NOTE — Assessment & Plan Note (Signed)
Well controlled, no changes to meds. Encouraged heart healthy diet such as the DASH diet and exercise as tolerated.  °

## 2022-10-06 ENCOUNTER — Other Ambulatory Visit: Payer: Self-pay

## 2022-10-06 ENCOUNTER — Other Ambulatory Visit: Payer: Self-pay | Admitting: Family Medicine

## 2022-10-06 ENCOUNTER — Encounter: Payer: Self-pay | Admitting: Family Medicine

## 2022-10-06 ENCOUNTER — Ambulatory Visit: Payer: BLUE CROSS/BLUE SHIELD | Admitting: Family Medicine

## 2022-10-06 VITALS — BP 124/80 | HR 96 | Temp 98.0°F | Resp 16 | Ht 65.0 in | Wt 206.0 lb

## 2022-10-06 DIAGNOSIS — I10 Essential (primary) hypertension: Secondary | ICD-10-CM

## 2022-10-06 DIAGNOSIS — R252 Cramp and spasm: Secondary | ICD-10-CM

## 2022-10-06 DIAGNOSIS — R739 Hyperglycemia, unspecified: Secondary | ICD-10-CM | POA: Diagnosis not present

## 2022-10-06 DIAGNOSIS — Z79899 Other long term (current) drug therapy: Secondary | ICD-10-CM | POA: Diagnosis not present

## 2022-10-06 DIAGNOSIS — Z9013 Acquired absence of bilateral breasts and nipples: Secondary | ICD-10-CM | POA: Diagnosis not present

## 2022-10-06 DIAGNOSIS — M7989 Other specified soft tissue disorders: Secondary | ICD-10-CM | POA: Diagnosis not present

## 2022-10-06 DIAGNOSIS — M810 Age-related osteoporosis without current pathological fracture: Secondary | ICD-10-CM

## 2022-10-06 DIAGNOSIS — E782 Mixed hyperlipidemia: Secondary | ICD-10-CM | POA: Diagnosis not present

## 2022-10-06 LAB — COMPREHENSIVE METABOLIC PANEL
ALT: 24 U/L (ref 0–35)
AST: 20 U/L (ref 0–37)
Albumin: 4.2 g/dL (ref 3.5–5.2)
Alkaline Phosphatase: 63 U/L (ref 39–117)
BUN: 14 mg/dL (ref 6–23)
CO2: 27 mEq/L (ref 19–32)
Calcium: 10 mg/dL (ref 8.4–10.5)
Chloride: 102 mEq/L (ref 96–112)
Creatinine, Ser: 0.67 mg/dL (ref 0.40–1.20)
GFR: 94.81 mL/min (ref >60.00)
Glucose, Bld: 88 mg/dL (ref 70–99)
Potassium: 4.3 mEq/L (ref 3.5–5.1)
Sodium: 139 mEq/L (ref 135–145)
Total Bilirubin: 0.6 mg/dL (ref 0.2–1.2)
Total Protein: 6.7 g/dL (ref 6.0–8.3)

## 2022-10-06 LAB — LIPID PANEL
Cholesterol: 246 mg/dL — ABNORMAL HIGH (ref 0–200)
HDL: 78.4 mg/dL (ref >39.00)
LDL Cholesterol: 131 mg/dL — ABNORMAL HIGH (ref 0–99)
NonHDL: 167.93
Total CHOL/HDL Ratio: 3
Triglycerides: 185 mg/dL — ABNORMAL HIGH (ref 0.0–149.0)
VLDL: 37 mg/dL (ref 0.0–40.0)

## 2022-10-06 LAB — HEMOGLOBIN A1C: Hgb A1c MFr Bld: 6.6 % — ABNORMAL HIGH (ref 4.6–6.5)

## 2022-10-06 LAB — TSH: TSH: 2.58 u[IU]/mL (ref 0.35–5.50)

## 2022-10-06 LAB — VITAMIN D 25 HYDROXY (VIT D DEFICIENCY, FRACTURES): VITD: 43.01 ng/mL (ref 30.00–100.00)

## 2022-10-06 MED ORDER — FENOFIBRATE 120 MG PO TABS: ORAL_TABLET | ORAL | 0 refills | Status: DC

## 2022-10-06 MED ORDER — LANCET DEVICE MISC: 1.0000 | Freq: Three times a day (TID) | 0 refills | Status: DC

## 2022-10-06 MED ORDER — BLOOD GLUCOSE MONITORING SUPPL DEVI: 1.0000 | 0 refills | Status: DC | PRN

## 2022-10-06 MED ORDER — BLOOD GLUCOSE TEST VI STRP: ORAL_STRIP | 2 refills | Status: DC

## 2022-10-06 MED ORDER — LANCET DEVICE MISC: 0 refills | Status: DC

## 2022-10-06 MED ORDER — LANCETS MISC. MISC: 3 refills | Status: DC

## 2022-10-06 NOTE — Patient Instructions (Signed)
Carbohydrate Counting Carbohydrate counting is a method of keeping track of how many carbohydrates you eat. Eating carbohydrates increases the amount of sugar (glucose) in the blood. Counting how many carbohydrates you eat improves how well you manage your blood glucose. This, in turn, helps you manage your diabetes. Carbohydrates are measured in grams (g) per serving. It is important to know how many carbohydrates (in grams or by serving size) you can have in each meal. This is different for every person. A dietitian can help you make a meal plan and calculate how many carbohydrates you should have at each meal and snack. What foods contain carbohydrates? Carbohydrates are found in the following foods: Grains, such as breads and cereals. Dried beans and soy products. Starchy vegetables, such as potatoes, peas, and corn. Fruit and fruit juices. Milk and yogurt. Sweets and snack foods, such as cake, cookies, candy, chips, and soft drinks. How do I count carbohydrates in foods? There are two ways to count carbohydrates in food. You can read food labels or learn standard serving sizes of foods. You can use either of these methods or a combination of both. Using the Nutrition Facts label The Nutrition Facts list is included on the labels of almost all packaged foods and beverages in the [place]. It includes: The serving size. Information about nutrients in each serving, including the grams of carbohydrate per serving. To use the Nutrition Facts, decide how many servings you will have. Then, multiply the number of servings by the number of carbohydrates per serving. The resulting number is the total grams of carbohydrates that you will be having. Learning the standard serving sizes of foods When you eat carbohydrate foods that are not packaged or do not include Nutrition Facts on the label, you need to measure the servings in order to count the grams of carbohydrates. Measure the foods that you  will eat with a food scale or measuring cup, if needed. Decide how many standard-size servings you will eat. Multiply the number of servings by 15. For foods that contain carbohydrates, one serving equals 15 g of carbohydrates. For example, if you eat 2 cups or 10 oz (300 g) of strawberries, you will have eaten 2 servings and 30 g of carbohydrates (2 servings x 15 g = 30 g). For foods that have more than one food mixed, such as soups and casseroles, you must count the carbohydrates in each food that is included. The following list contains standard serving sizes of common carbohydrate-rich foods. Each of these servings has about 15 g of carbohydrates: 1 slice of bread. 1 six-inch (15 cm) tortilla. ? cup or 2 oz (53 g) cooked rice or pasta.  cup or 3 oz (85 g) cooked or canned, drained and rinsed beans or lentils.  cup or 3 oz (85 g) starchy vegetable, such as peas, corn, or squash.  cup or 4 oz (120 g) hot cereal.  cup or 3 oz (85 g) boiled or mashed potatoes, or  or 3 oz (85 g) of a large baked potato.  cup or 4 fl oz (118 mL) fruit juice. 1 cup or 8 fl oz (237 mL) milk. 1 small or 4 oz (106 g) apple.  or 2 oz (63 g) of a medium banana. 1 cup or 5 oz (150 g) strawberries. 3 cups or 1 oz (28.3 g) popped popcorn. What is an example of carbohydrate counting? To calculate the grams of carbohydrates in this sample meal, follow the steps shown below. Sample meal  3 oz (85 g) chicken breast. ? cup or 4 oz (106 g) brown rice.  cup or 3 oz (85 g) corn. 1 cup or 8 fl oz (237 mL) milk. 1 cup or 5 oz (150 g) strawberries with sugar-free whipped topping. Carbohydrate calculation Identify the foods that contain carbohydrates: Rice. Corn. Milk. Strawberries. Calculate how many servings you have of each food: 2 servings rice. 1 serving corn. 1 serving milk. 1 serving strawberries. Multiply each number of servings by 15 g: 2 servings rice x 15 g = 30 g. 1 serving corn x 15 g = 15 g. 1  serving milk x 15 g = 15 g. 1 serving strawberries x 15 g = 15 g. Add together all of the amounts to find the total grams of carbohydrates eaten: 30 g + 15 g + 15 g + 15 g = 75 g of carbohydrates total. What are tips for following this plan? Shopping Develop a meal plan and then make a shopping list. Buy fresh and frozen vegetables, fresh and frozen fruit, dairy, eggs, beans, lentils, and whole grains. Look at food labels. Choose foods that have more fiber and less sugar. Avoid processed foods and foods with added sugars. Meal planning Aim to have the same number of grams of carbohydrates at each meal and for each snack time. Plan to have regular, balanced meals and snacks. Where to find more information American Diabetes Association: diabetes.org Centers for Disease Control and Prevention: TonerPromos.no Academy of Nutrition and Dietetics: eatright.org Association of Diabetes Care & Education Specialists: diabeteseducator.org Summary Carbohydrate counting is a method of keeping track of how many carbohydrates you eat. Eating carbohydrates increases the amount of sugar (glucose) in your blood. Counting how many carbohydrates you eat improves how well you manage your blood glucose. This helps you manage your diabetes. A dietitian can help you make a meal plan and calculate how many carbohydrates you should have at each meal and snack. This information is not intended to replace advice given to you by your health care provider. Make sure you discuss any questions you have with your health care provider. Document Revised: 09/28/2019 Document Reviewed: 09/28/2019 Elsevier Patient Education  2024 [employer].

## 2022-10-06 NOTE — Assessment & Plan Note (Signed)
Very small, 1/cm, nontender and some tight muscles palpated in region. Likely benign and reactive just anterior to right ear. Report if persists. For now massage daily with lidcoaine gel

## 2022-10-07 ENCOUNTER — Telehealth: Payer: Self-pay | Admitting: Family Medicine

## 2022-10-07 ENCOUNTER — Encounter: Payer: Self-pay | Admitting: Family Medicine

## 2022-10-07 DIAGNOSIS — Z421 Encounter for breast reconstruction following mastectomy: Secondary | ICD-10-CM | POA: Diagnosis not present

## 2022-10-08 ENCOUNTER — Other Ambulatory Visit: Payer: Self-pay | Admitting: Family Medicine

## 2022-10-08 MED ORDER — PREDNISONE 5 MG PO TABS: ORAL_TABLET | ORAL | Status: DC

## 2022-10-08 NOTE — Telephone Encounter (Signed)
PA initiated via Covermymeds; KEY; [id number]. Awaiting determination.

## 2022-10-09 ENCOUNTER — Telehealth: Payer: Self-pay | Admitting: Family Medicine

## 2022-10-09 NOTE — Telephone Encounter (Signed)
Sent pt message to let us know what her insurance cover.

## 2022-10-09 NOTE — Telephone Encounter (Signed)
Accu-chek brand not covered, recommend that Pt call her insurance to see what brand name glucometer that they do cover.

## 2022-10-10 ENCOUNTER — Encounter: Payer: Self-pay | Admitting: Family Medicine

## 2022-10-13 ENCOUNTER — Other Ambulatory Visit: Payer: Self-pay | Admitting: Family Medicine

## 2022-10-13 ENCOUNTER — Other Ambulatory Visit: Payer: Self-pay

## 2022-10-13 MED ORDER — FENOFIBRATE 134 MG PO CAPS: 134.0000 mg | ORAL_CAPSULE | Freq: Every day | ORAL | 3 refills | Status: DC

## 2022-10-13 MED ORDER — DEXCOM G7 SENSOR MISC: 3 refills | Status: DC

## 2022-10-13 NOTE — Telephone Encounter (Signed)
Medication sent.

## 2022-10-17 DIAGNOSIS — G4733 Obstructive sleep apnea (adult) (pediatric): Secondary | ICD-10-CM | POA: Diagnosis not present

## 2022-10-20 ENCOUNTER — Other Ambulatory Visit (HOSPITAL_COMMUNITY): Payer: Self-pay

## 2022-10-20 ENCOUNTER — Other Ambulatory Visit: Payer: Self-pay

## 2022-10-22 ENCOUNTER — Other Ambulatory Visit: Payer: Self-pay

## 2022-10-22 ENCOUNTER — Telehealth: Payer: Self-pay

## 2022-10-22 ENCOUNTER — Other Ambulatory Visit: Payer: Self-pay | Admitting: Family Medicine

## 2022-10-22 MED ORDER — DEXCOM G7 SENSOR MISC: 3 refills | Status: DC

## 2022-10-22 NOTE — Telephone Encounter (Signed)
PA needed Pharmacy asking for preauthorization for 90 days on the DexcomG7

## 2022-10-23 ENCOUNTER — Other Ambulatory Visit (HOSPITAL_COMMUNITY): Payer: Self-pay

## 2022-10-23 ENCOUNTER — Other Ambulatory Visit: Payer: Self-pay | Admitting: Family Medicine

## 2022-10-23 MED ORDER — TIRZEPATIDE 2.5 MG/0.5ML ~~LOC~~ SOAJ: 2.5000 mg | SUBCUTANEOUS | 1 refills | Status: DC

## 2022-10-23 NOTE — Telephone Encounter (Signed)
Lewis from [facility] called stating they have not received the PA from our office for the dexcom. He stated he did not have a fax # but we could call the optimization team at [phone]. Also regarding the metformin, he states the pt has stomach issues from taking this and she could try rybelsus instead. Case # [id number].

## 2022-10-23 NOTE — Telephone Encounter (Signed)
Per test claim, pt must be on insulin for this to be covered. Will pt be starting insulin therapy?

## 2022-10-24 ENCOUNTER — Encounter: Payer: Self-pay | Admitting: Pharmacist

## 2022-10-24 ENCOUNTER — Other Ambulatory Visit: Payer: Self-pay | Admitting: Pharmacist

## 2022-10-24 ENCOUNTER — Other Ambulatory Visit (HOSPITAL_COMMUNITY): Payer: Self-pay

## 2022-10-24 MED ORDER — ONETOUCH VERIO VI STRP: ORAL_STRIP | 5 refills | Status: AC

## 2022-10-24 MED ORDER — ONETOUCH DELICA LANCETS 33G MISC: 5 refills | Status: AC

## 2022-10-24 MED ORDER — ONETOUCH VERIO FLEX SYSTEM W/DEVICE KIT: PACK | 0 refills | Status: AC

## 2022-10-24 NOTE — Telephone Encounter (Signed)
Spoke with [facility] in [place] and cost of AccuChek glucometer / strips was $29.99 (normally $56)  Called [employer] to see which glucometer is preferred  - they were able to tell me that Freestyle Lite was not preferred and that One Touch Ultra and Verio were preferred but could not tell me the tier unless patient was on the phone with me.  Patient's tier structure is  Tier 1 - $25 Tier 2 $25 Tier 3 - $75 Tier 4 - $150  Tier 5 - 50% of med cost.   Sent Rx for One Touch Verio, strips and lancets to [facility].

## 2022-10-24 NOTE — Progress Notes (Signed)
   10/24/2022 Name: Sandra [name] MRN: [id number] DOB: 1960/10/30  Chief Complaint  Patient presents with   Medication Management    SPENSER SORTINO is a 62 y.o. year old female she was referred to the pharmacist by their PCP for assistance in managing medication access.   Coordinated with Cover My Meds, pharmacy and [employer] / [employer] today.   Subjective: Provider sent request to assist patient with finding glucometer covered by her insurance.  Patient has not been able to get a glucometer that is covered by her insurance.  Accu -Check was sent to her pharmacy but per patient this was not covered. She called her insurance and they told her Dex Com was covered but would needed prior authorization. They did not realized thought that DexCom is only covered by her plan if she is taking insulin.   Also has was not able to tolerate metformin - caused diarrhea. Dr Abner Greenspan changed to [facility] but looks like it needs prior authorization.   Patient was diagnosed with type 2 DM after her last labs showed an A1c of 6.6%.   Stain therapy? yes  [facility]acy:  [phone] [place] [place] - [address] AT [facility] [place] [address] [place] [address] Phone: [phone] Fax: [phone]    Objective:  Lab Results  Component Value Date   HGBA1C 6.6 (H) 10/06/2022    Lab Results  Component Value Date   CREATININE 0.67 10/06/2022   BUN 14 10/06/2022   NA 139 10/06/2022   K 4.3 10/06/2022   CL 102 10/06/2022   CO2 27 10/06/2022    Lab Results  Component Value Date   CHOL 246 (H) 10/06/2022   HDL 78.40 10/06/2022   LDLCALC 131 (H) 10/06/2022   LDLDIRECT 179.0 09/03/2021   TRIG 185.0 (H) 10/06/2022   CHOLHDL 3 10/06/2022    Medications Reviewed Today   Medications were not reviewed in this encounter       Assessment/Plan:   Diabetes: New diagnosis; was not able to tolerate metformin. Has not been able to get  glucometer due to insurance formulary issues. - Checked patient's formulary - She has [employer] Bronze plan - Formulary is Rx4D and PMB is Teacher, early years/pre. It looks like Mounjaro should be tier 3 but needs prior authorization. Sent prior authorization thru Cover My Meds.  Tier structure is: Tier 1 - $25 Tier 2 $25 Tier 3 - $75 Tier 4 - $150  Tier 5 - 50% of med cost.   - [employer] / [employer] to check on preferred glucometer since DexCom 7 is only allow if patient is using insulin.  [employer] representative told me that One Touch Ultra and Verio were covered but was not able to tell me tier unless patient was on phone with me. [facility] Verio meter, test strips and Delica lancets.    Henrene Pastor, PharmD Clinical Pharmacist [facility]

## 2022-10-27 ENCOUNTER — Other Ambulatory Visit: Payer: Self-pay | Admitting: Family Medicine

## 2022-10-27 ENCOUNTER — Other Ambulatory Visit: Payer: Self-pay

## 2022-10-27 MED ORDER — DEXCOM G7 SENSOR MISC: 3 refills | Status: DC

## 2022-10-27 NOTE — Telephone Encounter (Signed)
Dexcom G7 sensor was sent.

## 2022-10-27 NOTE — Telephone Encounter (Signed)
Called pt was advised stated understand

## 2022-10-30 ENCOUNTER — Encounter: Payer: Self-pay | Admitting: Pharmacist

## 2022-10-30 NOTE — Progress Notes (Signed)
Prior auth for [facility] approved.  Sandra [name] (Key: [id number]) [phone]  Your request has been approved Approved. Please note: The allowed amount is 2 mL (4 pens) per 180 days. This medication is to be increased in strength every 30 days, as tolerated by the member, to a maintenance dose of up to 15mg /0.46mL (4 pens per 28 days). The program limit of 4 pens per 180 days of the 2.5mg /0.35mL strength allows for this dose increase.  Will follow up with patient in 3 weeks for planned titration.

## 2022-11-05 DIAGNOSIS — Z01419 Encounter for gynecological examination (general) (routine) without abnormal findings: Secondary | ICD-10-CM | POA: Diagnosis not present

## 2022-11-06 DIAGNOSIS — M25551 Pain in right hip: Secondary | ICD-10-CM | POA: Diagnosis not present

## 2022-11-08 ENCOUNTER — Other Ambulatory Visit: Payer: Self-pay | Admitting: Family Medicine

## 2022-11-11 ENCOUNTER — Other Ambulatory Visit: Payer: Self-pay

## 2022-11-13 ENCOUNTER — Other Ambulatory Visit (HOSPITAL_COMMUNITY): Payer: Self-pay

## 2022-11-13 ENCOUNTER — Other Ambulatory Visit: Payer: BLUE CROSS/BLUE SHIELD | Admitting: Pharmacist

## 2022-11-13 MED ORDER — TIRZEPATIDE 5 MG/0.5ML ~~LOC~~ SOAJ: 5.0000 mg | SUBCUTANEOUS | 0 refills | Status: DC

## 2022-11-13 NOTE — Progress Notes (Signed)
11/13/2022 Name: Sandra [name] MRN: [id number] DOB: Jul 12, 1960  Chief Complaint  Patient presents with   Diabetes    Follow up Greggory Keen start   Medication Management    Follow up    DONEEN BELLANCA is a 62 y.o. year old female she was referred to the pharmacist by their PCP for assistance in managing medication access.   Spoke with Mrs. Dubas by phone today.   Subjective: Type 2 DM  and obesity - Diagnosed 10/2022 after her last labs showed an A1c of 6.6%.  Patient has rheumatoid arthritis and reports she has steroid injection about 3 months feet or hip. She also will sometimes need systemic /oral prednisone for burst therapy when RA is not well controlled. She asks how this might affect her blood glucose.   Starting weight = 206lbs; Current weight = 200lbs  Starting BMI = 34.4; Current BMI = 33.3 Height = 5\' 5"   Current therapy: Mounjaro 2.5mg  weekly - first dose was 11/01/2022.   Previous medications tried:  metformin - caused diarrhea.   Patient reports she has lost about 6lbs since starting [facility].  She is using One Touch Verio to check her blood glucose daily.  States blood glucose usually between 100 and 130 in the morning but a a few times has been 150's. Yesterday blood glucose was 158.   Diet: she has been drinking more water and is following a 1500 calorie, diabetic diet.  She recently stopped taking a tumeric gummy that was coated in sugar (had 4 grams of sugar per gummy)   Prior auth for [facility] approved 10/30/2022 but [employer] provided the following approval notes.  "Your request has been approved. Approved. Please note: The allowed amount is 2 mL (4 pens) per 180 days. This medication is to be increased in strength every 30 days, as tolerated by the member, to a maintenance dose of up to 15mg /0.20mL (4 pens per 28 days). The program limit of 4 pens per 180 days of the 2.5mg /0.70mL strength allows for this dose increase"   Stain therapy? yes  Current  [facility]armacy:  [phone] [place], [place] - [address] [facility] [place] [facility] [place] [facility] [address] [place] [address] Phone: [phone] Fax: [phone]    Objective:  Lab Results  Component Value Date   HGBA1C 6.6 (H) 10/06/2022    Lab Results  Component Value Date   CREATININE 0.67 10/06/2022   BUN 14 10/06/2022   NA 139 10/06/2022   K 4.3 10/06/2022   CL 102 10/06/2022   CO2 27 10/06/2022    Lab Results  Component Value Date   CHOL 246 (H) 10/06/2022   HDL 78.40 10/06/2022   LDLCALC 131 (H) 10/06/2022   LDLDIRECT 179.0 09/03/2021   TRIG 185.0 (H) 10/06/2022   CHOLHDL 3 10/06/2022    Medications Reviewed Today     Reviewed by Henrene Pastor, RPH-CPP (Pharmacist) on 11/13/22 at 1024  Med List Status: <None>   Medication Order Taking? Sig Documenting Provider Last Dose Status Informant  ALPRAZolam (XANAX) 0.25 MG tablet [id number]  Take 1 tablet (0.25 mg total) by mouth daily as needed. Bradd Canary, MD  Active   aspirin 325 MG tablet [id number]  Take 325 mg by mouth daily. [provider]  Active   atorvastatin (LIPITOR) 80 MG tablet [id number] Yes TAKE 1 TABLET BY MOUTH DAILY. Bradd Canary, MD Taking Active   Blood Glucose Monitoring Suppl [facility] VERIO FLEX SYSTEM) w/Device Andria Rhein [id number] Yes  Use to check blood glucose 1 to 2 times per day Bradd Canary, MD Taking Active   Cholecalciferol (VITAMIN D3) 125 MCG (5000 UT) CAPS [id number] Yes Take by mouth 2 (two) times a week. [provider] Taking Active   diclofenac (VOLTAREN) 75 MG EC tablet [id number] No 1 tablet by mouth every 12 hours as needed with food  Patient not taking: Reported on 11/13/2022   [provider] Not Taking Active   escitalopram (LEXAPRO) 10 MG tablet [id number] Yes Take 1 tablet (10 mg total) by mouth daily. Bradd Canary, MD Taking Active   ezetimibe (ZETIA) 10 MG tablet [id number] Yes Take 1 tablet (10 mg total) by mouth daily.  Bradd Canary, MD Taking Active   fenofibrate micronized (LOFIBRA) 134 MG capsule [id number] Yes Take 1 capsule (134 mg total) by mouth daily before breakfast. Bradd Canary, MD Taking Active   furosemide (LASIX) 20 MG tablet [id number] Yes TAKE 1 TABLET BY MOUTH EVERY DAY Bradd Canary, MD Taking Active   glucose blood (ONETOUCH VERIO) test strip [id number] Yes Use to check blood glucose 1 to 2 times per day Bradd Canary, MD Taking Active   irbesartan (AVAPRO) 150 MG tablet [id number] No TAKE 1 TABLET BY MOUTH EVERY DAY  Patient not taking: Reported on 11/13/2022   Bradd Canary, MD Not Taking Active   lactobacillus acidophilus (BACID) TABS tablet [id number]  Take 2 tablets by mouth 3 (three) times daily. [provider]  Active   Lancet Device MISC [id number]  PRN as needed. Bradd Canary, MD  Active   Lancets Misc. MISC [id number]  PRN as needed. Bradd Canary, MD  Active   leflunomide (ARAVA) 20 MG tablet [id number] Yes Take 1 tablet (20 mg total) by mouth daily. Gearldine Bienenstock, PA-C Taking Active   levothyroxine (SYNTHROID) 75 MCG tablet [id number] Yes TAKE 1 TABLET BY MOUTH DAILY BEFORE BREAKFAST. Bradd Canary, MD Taking Active   methocarbamol (ROBAXIN) 500 MG tablet [id number]  Take 1 tablet (500 mg total) by mouth 2 (two) times daily. Loetta Rough, MD  Active   metoprolol succinate (TOPROL-XL) 25 MG 24 hr tablet [id number] Yes TAKE 1 TABLET (25 MG TOTAL) BY MOUTH DAILY. Bradd Canary, MD Taking Active   omeprazole (PRILOSEC) 40 MG capsule [id number] Yes TAKE 1 CAPSULE BY MOUTH EVERY DAY Bradd Canary, MD Taking Active   OneTouch Delica Lancets 33G [place] [id number]  Use to check blood glucose 1 to 2 times per day Bradd Canary, MD  Active   predniSONE (DELTASONE) 5 MG tablet [id number] No Take 4 tablets by mouth daily x2 days, 3 tablets daily x2 days, 2 tablets daily x2 days, 1 tablet daily x2 days prn  Patient not taking: Reported on 11/13/2022   Bradd Canary, MD Not  Taking Active   tirzepatide [facility]) 2.5 MG/0.5ML Pen [id number] Yes Inject 2.5 mg into the skin once a week. Bradd Canary, MD Taking Active   Trospium Chloride 60 MG CP24 [id number] Yes trospium ER 60 mg capsule,extended release 24 hr  TAKE 1 CAPSULE BY MOUTH EVERY DAY [provider] Taking Active   TURMERIC PO [id number] Yes Take 1 tablet by mouth 2 (two) times daily. [provider] Taking Active   Upadacitinib ER [facility]) 15 MG [id number] Yes Take 1 tablet (15 mg total) by mouth daily. Gearldine Bienenstock, PA-C Taking Active   verapamil (CALAN-SR) 120 MG CR tablet [id number] Yes TAKE 1  TABLET BY MOUTH EVERYDAY AT BEDTIME Bradd Canary, MD Taking Active               Assessment/Plan:   Diabetes / obesity: Tolerating Mounjaro well. Has lost 6lbs and home blood glucose has improved.  - IPatient will finish her current prescription of Mounjaro 2.5mg  (has dose for 09/07 and 09/14.  She will then increase Mounjaro to 5mg  weekly. Prescription sent to her pharmacy. She might have to wait a few more days before insurance will allow but sending in early incase there are any supply issues or prior authorization is needed for new strength.  - Continue with current diet changes.  - Reviewed home blood glucose readings and reviewed goals  Fasting blood glucose goal (before meals) = 80 to 130 Blood glucose goal after a meal = less than 180  -Discussed effects of oral steroids on blood glucose and that an increase in blood glucose is also possible 1 to 3 days after joint injections of steroids but is usually milder than oral / systemic steroids.  .  Follow up in 1 month to assess blood glucose and consider Mounjaro titration  Henrene Pastor, PharmD Clinical Pharmacist [facility]

## 2022-11-17 ENCOUNTER — Other Ambulatory Visit (HOSPITAL_COMMUNITY): Payer: Self-pay

## 2022-11-17 DIAGNOSIS — G4733 Obstructive sleep apnea (adult) (pediatric): Secondary | ICD-10-CM | POA: Diagnosis not present

## 2022-11-19 ENCOUNTER — Other Ambulatory Visit: Payer: Self-pay

## 2022-11-19 ENCOUNTER — Other Ambulatory Visit (HOSPITAL_COMMUNITY): Payer: Self-pay

## 2022-11-25 NOTE — Progress Notes (Signed)
Office Visit Note  Patient: Sandra [name]             Date of Birth: Oct 02, 1960           MRN: [id number]             PCP: Bradd Canary, MD Referring: Bradd Canary, MD Visit Date: 12/09/2022 Occupation: @GUAROCC @  Subjective:  Medication monitoring   History of Present Illness: Sandra [name] is a 62 y.o. female with history of rheumatoid arthritis and osteoarthritis.  Patient remains on Rinvoq 15 mg 1 tablet by mouth daily and arava 20 mg daily.  She is tolerating rinvoq and arava without any side effects.  She held her dose of Arava today due to being scheduled for upcoming surgery on 01/06/23-expander removal.  She is planning on holding rinvoq 2 weeks prior to surgery.  Patient denies any recent flares since she has been able to take her medications consistently.  She states that on 11/06/22 she had a right hip intra-articular injection which alleviated her discomfort.  She is scheduled for an upcoming visit with Dr. Logan Bores on 12/15/2022.     Activities of Daily Living:  Patient reports morning stiffness for 1 hour.   Patient Denies nocturnal pain.  Difficulty dressing/grooming: Denies Difficulty climbing stairs: Denies Difficulty getting out of chair: Denies Difficulty using hands for taps, buttons, cutlery, and/or writing: Reports  Review of Systems  Constitutional:  Positive for fatigue.  HENT:  Positive for mouth dryness. Negative for mouth sores.   Eyes:  Positive for dryness.  Respiratory:  Negative for shortness of breath.   Cardiovascular:  Negative for chest pain and palpitations.  Gastrointestinal:  Negative for blood in stool, constipation and diarrhea.  Endocrine: Negative for increased urination.  Genitourinary:  Negative for involuntary urination.  Musculoskeletal:  Positive for morning stiffness. Negative for joint pain, gait problem, joint pain, joint swelling, myalgias, muscle weakness, muscle tenderness and myalgias.  Skin:  Negative for color  change, rash, hair loss and sensitivity to sunlight.  Allergic/Immunologic: Negative for susceptible to infections.  Neurological:  Negative for dizziness and headaches.  Hematological:  Negative for swollen glands.  Psychiatric/Behavioral:  Positive for sleep disturbance. Negative for depressed mood. The patient is not nervous/anxious.     PMFS History:  Patient Active Problem List   Diagnosis Date Noted  . Nodule of soft tissue 10/06/2022  . Osteoporosis 06/04/2022  . Hashimoto's thyroiditis 04/08/2022  . Bronchitis 01/09/2022  . H/O mastectomy, bilateral 11/29/2021  . Malignant neoplasm of right breast in female, estrogen receptor positive (HCC) 10/14/2021  . Insomnia 09/04/2021  . Osteopenia 09/04/2021  . Pulmonary nodule 03/29/2021  . Rheumatoid arthritis (HCC) 12/02/2019  . Anxiety and depression 08/01/2019  . Arthralgia 05/04/2019  . OSA (obstructive sleep apnea) 05/03/2019  . Thyroiditis 12/20/2018  . Muscle cramp 12/20/2018  . Eustachian tube dysfunction 07/27/2017  . Headache 11/25/2015  . Acute bacterial sinusitis 03/26/2015  . Hyperglycemia 04/06/2014  . Hyperparathyroidism, primary (HCC) 07/12/2013  . Hypercalcemia 12/16/2012  . H/O tobacco use, presenting hazards to health 11/29/2012  . Overactive bladder 05/31/2012  . Elevated LFTs 05/31/2012  . Obesity (BMI 30-39.9) 05/31/2012  . Allergic rhinitis 05/31/2012  . Preventative health care 02/04/2012  . GERD (gastroesophageal reflux disease)   . Rotator cuff syndrome of right shoulder 06/11/2011  . Hypertension 01/03/2011  . Hyperlipidemia 01/03/2011  . KNEE PAIN, BILATERAL 09/28/2008  . ANSERINE BURSITIS 09/28/2008  . CAVUS DEFORMITY OF FOOT, ACQUIRED 09/28/2008  Past Medical History:  Diagnosis Date  . Allergic rhinitis 05/31/2012  . Breast cancer (HCC)    right breast, dx 10/2021.  Marland Kitchen Chest pain    PT SAW CARDIOLOGIST DR. Excell Seltzer - STRESS ECHO DONE NEGATIVE - NO PROBLEM SINCE  . Chicken pox as achild   . Cough 11/29/2012   RESOLVED - IT WAS CAUSED BY LISINOPRIL  . COVID-19    11/2022  . Elevated LFTs 05/31/2012  . Eustachian tube dysfunction 07/27/2017  . GERD (gastroesophageal reflux disease)   . H/O tobacco use, presenting hazards to health 11/29/2012   Smoked 1 1/2 ppd for 20 years quit in roughtly 2007  . Headache 11/25/2015  . Hematuria 04/09/2014  . Hoarseness 02/04/2012   RESOLVED - THOUGHT TO HAVE BEEN ALLERY RELATED  . Hyperglycemia 04/06/2014  . Hyperlipidemia   . Hyperparathyroidism (HCC)   . Hypertension   . Obesity, unspecified 05/31/2012  . Ocular migraine   . Overactive bladder 05/31/2012  . Preventative health care 02/04/2012  . PTSD (post-traumatic stress disorder)   . Rapid heart beat    PT FEELS PROB RELATED TO HYPER PARATHYROIDISM  . Rheumatoid arthritis (HCC)   . SUI (stress urinary incontinence, female) 02/04/2012   Sees Dr Newt Minion and they have discussed a bladder tack but so far she declines    Family History  Problem Relation Age of Onset  . Hypertension Mother 34       alive  . Renal cancer Mother        renal  . Hyperlipidemia Mother   . Osteoporosis Mother        humerus   . Macular degeneration Mother   . Tremor Mother   . Parkinson's disease Mother   . Hypertension Father 16       alive  . Atrial fibrillation Father   . Colon cancer Father        colon  . Hyperlipidemia Father   . Stroke Father   . Alzheimer's disease Father   . Hypertension Sister   . Hypertension Brother 56       alive  . Tremor Brother   . Asthma Son   . Eczema Son   . Healthy Son   . Heart disease Paternal Aunt   . Heart disease Paternal Uncle   . Lung cancer Maternal Grandfather        lung/ smoker  . Emphysema Maternal Grandfather   . Heart disease Paternal Grandmother   . Other Paternal Grandfather        black lung  . Heart disease Paternal Grandfather    Past Surgical History:  Procedure Laterality Date  . BREAST EXCISIONAL BIOPSY Left     Age 39 Fibroadenoma  . BREAST SURGERY     left breast excision of adenoma  . btl    . COLONOSCOPY  07/2020  . FOOT SURGERY    . MASTECTOMY Bilateral 11/28/2021  . PARATHYROIDECTOMY Right 08/12/2013   Procedure: right inferior frozen section PARATHYROIDECTOMY;  Surgeon: Velora Heckler, MD;  Location: [facility];  Service: General;  Laterality: Right;  . PELVIC LAPAROSCOPY  03/2004  . TISSUE EXPANDER PLACEMENT Bilateral 08/14/2022   at [facility]  . TOTAL SHOULDER REPLACEMENT Right 01/2019  . TUBAL LIGATION    . WISDOM TOOTH EXTRACTION  30 yrs ago   Social History   Social History Narrative  . Not on file   Immunization History  Administered Date(s) Administered  . Hepatitis A, Adult 09/04/2016, 03/27/2017  . Influenza Split  12/09/2011  . Influenza,inj,Quad PF,6+ Mos 11/29/2012, 11/01/2013, 11/20/2015, 12/29/2017, 04/03/2019, 12/13/2019, 03/28/2021  . PFIZER(Purple Top)SARS-COV-2 Vaccination 05/20/2019, 06/15/2019, 01/11/2020  . Pneumococcal Conjugate-13 03/28/2021  . Pneumococcal Polysaccharide-23 08/01/2019  . Tdap 06/07/2015  . Zoster Recombinant(Shingrix) 08/01/2019, 10/03/2020     Objective: Vital Signs: BP (!) 146/99 (BP Location: Left Arm, Patient Position: Sitting, Cuff Size: Large)   Pulse 96   Resp 16   Ht 5\' 5"  (1.651 m)   Wt 189 lb 9.6 oz (86 kg)   LMP 04/01/2011   BMI 31.55 kg/m    Physical Exam Vitals and nursing note reviewed.  Constitutional:      Appearance: She is well-developed.  HENT:     Head: Normocephalic and atraumatic.  Eyes:     Conjunctiva/sclera: Conjunctivae normal.  Cardiovascular:     Rate and Rhythm: Normal rate and regular rhythm.     Heart sounds: Normal heart sounds.  Pulmonary:     Effort: Pulmonary effort is normal.     Breath sounds: Normal breath sounds.  Abdominal:     General: Bowel sounds are normal.     Palpations: Abdomen is soft.  Musculoskeletal:     Cervical back: Normal range of motion.  Lymphadenopathy:      Cervical: No cervical adenopathy.  Skin:    General: Skin is warm and dry.     Capillary Refill: Capillary refill takes less than 2 seconds.  Neurological:     Mental Status: She is alert and oriented to person, place, and time.  Psychiatric:        Behavior: Behavior normal.     Musculoskeletal Exam: C-spine, thoracic spine, lumbar spine have good range of motion.  Left shoulder has good ROM.  Right shoulder replacement has good ROM.  elbow joints, wrist joints, MCPs, PIPs, DIPs have good range of motion with no synovitis.  Complete fist formation noted bilaterally.  PIP and DIP thickening consistent with osteoarthritis of both hands.  Hip joints have good range of motion with mild discomfort in the right hip.  Knee joints have good range of motion with no warmth or effusion.  Ankle joints have good range of motion no tenderness or synovitis.  No tenderness over MTP joints currently.  PIP and DIP thickening consistent with osteoarthritis of both feet.  CDAI Exam: CDAI Score: -- Patient Global: 50 / 100; Provider Global: 20 / 100 Swollen: --; Tender: -- Joint Exam 12/09/2022   No joint exam has been documented for this visit   There is currently no information documented on the homunculus. Go to the Rheumatology activity and complete the homunculus joint exam.  Investigation: No additional findings.  Imaging: No results found.  Recent Labs: Lab Results  Component Value Date   WBC 8.4 09/04/2022   HGB 12.7 09/04/2022   PLT 397 09/04/2022   NA 139 10/06/2022   K 4.3 10/06/2022   CL 102 10/06/2022   CO2 27 10/06/2022   GLUCOSE 88 10/06/2022   BUN 14 10/06/2022   CREATININE 0.67 10/06/2022   BILITOT 0.6 10/06/2022   ALKPHOS 63 10/06/2022   AST 20 10/06/2022   ALT 24 10/06/2022   PROT 6.7 10/06/2022   ALBUMIN 4.2 10/06/2022   CALCIUM 10.0 10/06/2022   GFRAA 117 08/31/2020   QFTBGOLDPLUS NEGATIVE 09/04/2022    Speciality Comments: Orencia stoped 10/04/20 Rinvoq started  10/11/20  Procedures:  No procedures performed Allergies: Cefdinir, Doxycycline, Codeine, Penicillins, Simvastatin, and Other   Assessment / Plan:     Visit Diagnoses:  Rheumatoid arthritis of multiple sites with negative rheumatoid factor (HCC): She has no synovitis on examination today.  She has not had any signs or symptoms of a rheumatoid arthritis flare.  She has clinically been doing well taking Rinvoq 15 mg 1 tablet by mouth daily and Arava 20 mg 1 tablet by mouth daily.  She is tolerating combination therapy without any side effects and has not missed any doses recently.  As of today she we will be holding Arava due to upcoming breast expander removal on 01/06/23.  She plans to hold rinvoq 2 weeks prior to surgical intervention.  Patient will notify us if she develops signs or symptoms of a flare.  She will follow-up in the office in 3 months or sooner if needed.  High risk medication use - Rinvoq 15 mg 1 tablet by mouth daily.  Rinvoq added on at the end of July 2022-has been on hold since July 2023.  CBC updated on 09/04/22. CMP updated on 10/06/22.  Orders for CBC and CMP were released today. TB gold negative on 09/04/22.  Lipid panel updated on 10/06/22.  Discussed the importance of holding rinvoq if she develops signs or symptoms of an infection and to resume once the infection has completley cleared.  - Plan: CBC with Differential/Platelet, COMPLETE METABOLIC PANEL WITH GFR  Post-menopausal osteoporosis: DEXA updated on 09/09/21: The BMD measured at Forearm Radius 33% is 0.741 g/cm2 with a T-score of -1.5. Previous DEXA on 08/12/19:Radius 33% is 0.751 g/cm2 with a T-score of -1.4. Bilateral sacral alar fracture after stepping in a hole in January 2024.   History of left hip fracture-April 2024.  History of recurrent prednisone tapers and frequent cortisone injections. History of hyperparathyroidism--history of right parathyroidectomy 2015. History of Hashimoto's thyroiditis--remains on  Synthroid. Previous smoker. History of GERD--not a good candidate for oral bisphosphonate therapy.  History of breast cancer--plan avoiding Tymlos/Forteo.  Plan to avoid Evenity given risk for major cardiovascular events while on Rinvoq. IV reclast administered 06/09/22-no adverse effects.  She is taking vitamin D 5,000 twice weekly. She is taking a daily calcium supplement.  Discussed the importance of trying to avoid corticosteroid use. Due to update DEXA in July 2025.   Vitamin D deficiency:  She is taking vitamin D 5000 units twice weekly.   Status post total shoulder replacement, right: Doing well.    Primary osteoarthritis of both hands: PIP and DIP thickening consistent with osteoarthritis of both hands.  No synovitis noted.   Primary osteoarthritis of both feet - Under care of Dr. Guilford Shi injections every 3 months. Scheduled for upcoming visit on 12/15/22.   History of breast cancer: Expander removal scheduled 01/06/23. Held dose of Arava starting today.  Plans to hold rinvoq 2 weeks prior to surgery.  She will require clearance by Dr. Hardie Pulley during the postoperative period to resume therapy.   Other medical conditions are listed as follows:   Pes cavus of both feet  Anxiety and depression  Hyperparathyroidism, primary (HCC)  Essential hypertension  Hashimoto's thyroiditis  History of miscarriage  History of gastroesophageal reflux (GERD)  History of hyperlipidemia  Hx of migraines  Former smoker  Prediabetes  Orders: Orders Placed This Encounter  Procedures  . CBC with Differential/Platelet  . COMPLETE METABOLIC PANEL WITH GFR   No orders of the defined types were placed in this encounter.     Follow-Up Instructions: Return in about 3 months (around 03/11/2023) for Rheumatoid arthritis, Scleroderma.   Gearldine Bienenstock, PA-C  Note -  This record has been created using [employer].  Chart creation errors have been sought, but may not always   have been located. Such creation errors do not reflect on  the standard of medical care.

## 2022-11-30 ENCOUNTER — Other Ambulatory Visit: Payer: Self-pay | Admitting: Physician Assistant

## 2022-12-01 NOTE — Telephone Encounter (Signed)
Last Fill: 09/04/2022  Labs: 09/04/2022 glucose 114. Rest of CMP WNL. 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   Okay to refill Arava ?

## 2022-12-04 ENCOUNTER — Other Ambulatory Visit: Payer: Self-pay | Admitting: Family Medicine

## 2022-12-06 ENCOUNTER — Other Ambulatory Visit: Payer: Self-pay | Admitting: Family Medicine

## 2022-12-06 DIAGNOSIS — F32A Depression, unspecified: Secondary | ICD-10-CM

## 2022-12-08 ENCOUNTER — Other Ambulatory Visit: Payer: Self-pay | Admitting: Family Medicine

## 2022-12-08 ENCOUNTER — Other Ambulatory Visit: Payer: Self-pay

## 2022-12-09 ENCOUNTER — Ambulatory Visit: Payer: BLUE CROSS/BLUE SHIELD | Attending: Physician Assistant | Admitting: Physician Assistant

## 2022-12-09 ENCOUNTER — Encounter: Payer: Self-pay | Admitting: Physician Assistant

## 2022-12-09 VITALS — BP 146/99 | HR 96 | Resp 16 | Ht 65.0 in | Wt 189.6 lb

## 2022-12-09 DIAGNOSIS — Z8639 Personal history of other endocrine, nutritional and metabolic disease: Secondary | ICD-10-CM

## 2022-12-09 DIAGNOSIS — Z96611 Presence of right artificial shoulder joint: Secondary | ICD-10-CM

## 2022-12-09 DIAGNOSIS — Z79899 Other long term (current) drug therapy: Secondary | ICD-10-CM | POA: Diagnosis not present

## 2022-12-09 DIAGNOSIS — Q6671 Congenital pes cavus, right foot: Secondary | ICD-10-CM

## 2022-12-09 DIAGNOSIS — I10 Essential (primary) hypertension: Secondary | ICD-10-CM

## 2022-12-09 DIAGNOSIS — M19042 Primary osteoarthritis, left hand: Secondary | ICD-10-CM

## 2022-12-09 DIAGNOSIS — Z8759 Personal history of other complications of pregnancy, childbirth and the puerperium: Secondary | ICD-10-CM

## 2022-12-09 DIAGNOSIS — M0609 Rheumatoid arthritis without rheumatoid factor, multiple sites: Secondary | ICD-10-CM

## 2022-12-09 DIAGNOSIS — E559 Vitamin D deficiency, unspecified: Secondary | ICD-10-CM

## 2022-12-09 DIAGNOSIS — M19041 Primary osteoarthritis, right hand: Secondary | ICD-10-CM

## 2022-12-09 DIAGNOSIS — M810 Age-related osteoporosis without current pathological fracture: Secondary | ICD-10-CM

## 2022-12-09 DIAGNOSIS — E210 Primary hyperparathyroidism: Secondary | ICD-10-CM

## 2022-12-09 DIAGNOSIS — E063 Autoimmune thyroiditis: Secondary | ICD-10-CM

## 2022-12-09 DIAGNOSIS — Z853 Personal history of malignant neoplasm of breast: Secondary | ICD-10-CM

## 2022-12-09 DIAGNOSIS — Z87891 Personal history of nicotine dependence: Secondary | ICD-10-CM

## 2022-12-09 DIAGNOSIS — M19072 Primary osteoarthritis, left ankle and foot: Secondary | ICD-10-CM

## 2022-12-09 DIAGNOSIS — Z8719 Personal history of other diseases of the digestive system: Secondary | ICD-10-CM

## 2022-12-09 DIAGNOSIS — F32A Depression, unspecified: Secondary | ICD-10-CM

## 2022-12-09 DIAGNOSIS — Z8669 Personal history of other diseases of the nervous system and sense organs: Secondary | ICD-10-CM

## 2022-12-09 DIAGNOSIS — F419 Anxiety disorder, unspecified: Secondary | ICD-10-CM

## 2022-12-09 DIAGNOSIS — M19071 Primary osteoarthritis, right ankle and foot: Secondary | ICD-10-CM

## 2022-12-09 DIAGNOSIS — R7303 Prediabetes: Secondary | ICD-10-CM

## 2022-12-09 DIAGNOSIS — Q6672 Congenital pes cavus, left foot: Secondary | ICD-10-CM

## 2022-12-09 NOTE — Progress Notes (Signed)
Absolute monocytes are borderline elevated. Rest of CBC WNL.

## 2022-12-09 NOTE — Patient Instructions (Signed)

## 2022-12-10 LAB — CBC WITH DIFFERENTIAL/PLATELET
Absolute Monocytes: 1086 {cells}/uL — ABNORMAL HIGH (ref 200–950)
Basophils Absolute: 36 {cells}/uL (ref 0–200)
Basophils Relative: 0.4 %
Eosinophils Absolute: 80 {cells}/uL (ref 15–500)
Eosinophils Relative: 0.9 %
HCT: 38.7 % (ref 35.0–45.0)
Hemoglobin: 12.7 g/dL (ref 11.7–15.5)
Lymphs Abs: 1362 {cells}/uL (ref 850–3900)
MCH: 30.1 pg (ref 27.0–33.0)
MCHC: 32.8 g/dL (ref 32.0–36.0)
MCV: 91.7 fL (ref 80.0–100.0)
MPV: 10.6 fL (ref 7.5–12.5)
Monocytes Relative: 12.2 %
Neutro Abs: 6337 {cells}/uL (ref 1500–7800)
Neutrophils Relative %: 71.2 %
Platelets: 361 10*3/uL (ref 140–400)
RBC: 4.22 10*6/uL (ref 3.80–5.10)
RDW: 14.3 % (ref 11.0–15.0)
Total Lymphocyte: 15.3 %
WBC: 8.9 10*3/uL (ref 3.8–10.8)

## 2022-12-10 LAB — COMPLETE METABOLIC PANEL WITH GFR
AG Ratio: 1.4 (calc) (ref 1.0–2.5)
ALT: 33 U/L — ABNORMAL HIGH (ref 6–29)
AST: 37 U/L — ABNORMAL HIGH (ref 10–35)
Albumin: 4.1 g/dL (ref 3.6–5.1)
Alkaline phosphatase (APISO): 33 U/L — ABNORMAL LOW (ref 37–153)
BUN: 19 mg/dL (ref 7–25)
CO2: 26 mmol/L (ref 20–32)
Calcium: 10.4 mg/dL (ref 8.6–10.4)
Chloride: 101 mmol/L (ref 98–110)
Creat: 0.79 mg/dL (ref 0.50–1.05)
Globulin: 2.9 g/dL (ref 1.9–3.7)
Glucose, Bld: 93 mg/dL (ref 65–99)
Potassium: 4.5 mmol/L (ref 3.5–5.3)
Sodium: 138 mmol/L (ref 135–146)
Total Bilirubin: 0.3 mg/dL (ref 0.2–1.2)
Total Protein: 7 g/dL (ref 6.1–8.1)
eGFR: 86 mL/min/{1.73_m2} (ref >=60)

## 2022-12-10 NOTE — Progress Notes (Signed)
Alk phos is low-33.   AST and ALT are borderline elevated-please clarify if she has been taking any tylenol, NSAIDs, or alcohol use?

## 2022-12-15 ENCOUNTER — Encounter: Payer: Self-pay | Admitting: Podiatry

## 2022-12-15 ENCOUNTER — Ambulatory Visit (INDEPENDENT_AMBULATORY_CARE_PROVIDER_SITE_OTHER): Payer: BLUE CROSS/BLUE SHIELD | Admitting: Podiatry

## 2022-12-15 ENCOUNTER — Ambulatory Visit (INDEPENDENT_AMBULATORY_CARE_PROVIDER_SITE_OTHER): Payer: BLUE CROSS/BLUE SHIELD | Admitting: Pharmacist

## 2022-12-15 DIAGNOSIS — E669 Obesity, unspecified: Secondary | ICD-10-CM

## 2022-12-15 DIAGNOSIS — I10 Essential (primary) hypertension: Secondary | ICD-10-CM

## 2022-12-15 DIAGNOSIS — M19071 Primary osteoarthritis, right ankle and foot: Secondary | ICD-10-CM | POA: Diagnosis not present

## 2022-12-15 DIAGNOSIS — M19072 Primary osteoarthritis, left ankle and foot: Secondary | ICD-10-CM

## 2022-12-15 DIAGNOSIS — R739 Hyperglycemia, unspecified: Secondary | ICD-10-CM

## 2022-12-15 MED ORDER — BETAMETHASONE SOD PHOS & ACET 6 (3-3) MG/ML IJ SUSP
3.0000 mg | Freq: Once | INTRAMUSCULAR | Status: AC
Start: 2022-12-15 — End: 2022-12-15
  Administered 2022-12-15: 3 mg via INTRA_ARTICULAR

## 2022-12-15 MED ORDER — METHYLPREDNISOLONE 4 MG PO TBPK: ORAL_TABLET | ORAL | 0 refills | Status: DC

## 2022-12-15 MED ORDER — TIRZEPATIDE 7.5 MG/0.5ML ~~LOC~~ SOAJ: 7.5000 mg | SUBCUTANEOUS | 1 refills | Status: DC

## 2022-12-15 NOTE — Progress Notes (Signed)
12/15/2022 Name: LAKYA SIMENTAL MRN: [id number] DOB: 04-09-1960  Chief Complaint  Patient presents with   Medication Management    TERESSA BAES is a 62 y.o. year old female she was referred to the pharmacist by their PCP for assistance in managing medication access.   Spoke with Mrs. Bergsma by phone today.  Subjective: Type 2 DM  and obesity - Diagnosed 10/2022 after her last labs showed an A1c of 6.6%.   Patient has rheumatoid arthritis and reports she has steroid injection about every 3 months in feet or hip. She also will sometimes need systemic /oral prednisone for burst therapy when RA is not well controlled..   Starting weight = 206lbs; Current weight = 189lbs  Has lost about 17lbs (start date 11/01/2022) Starting BMI = 34.4; Current BMI = 31.55 Height = 5\' 5"   Current therapy: Mounjaro 5mg  weekly Initially started Mounjaro 2.5mg  11/01/2022. Increased to 5mg  weekly 11/29/2022  Previous medications tried:  metformin - caused diarrhea.   She is using One Touch Verio to check her blood glucose daily.  Recent home blood glucose range 77 to 160. Blood glucose usually 100 to 120's but blood glucose was elevated when she had COVID - possibly due to drinking regular Gatorade or infection.    Diet: Following a keto type diet. 1500 calories or less per day.  Usually eats 2 meals per day - 11am and 6pm. If she snacks between meals is usually low sugar yogurt or a few blueberries.  Lunch is tuna salad or green salad Dinner - meat + veggies; occasionally will have a small portion of low carb pasta or rice.   Prior auth for [facility] approved 10/30/2022 but [employer] provided the following approval notes.  "Your request has been approved. Approved. Please note: The allowed amount is 2 mL (4 pens) per 180 days. This medication is to be increased in strength every 30 days, as tolerated by the member, to a maintenance dose of up to 15mg /0.73mL (4 pens per 28 days). The program  limit of 4 pens per 180 days of the 2.5mg /0.48mL strength allows for this dose increase"  Stain therapy? Yes  Last blood pressure at rheumatology office was elevated. Patient reports she checks blood pressure at home from time to time -  SBP < 130 and DBP 85 to 95   BP Readings from Last 3 Encounters:  12/09/22 (!) 146/99  10/06/22 124/80  09/04/22 125/85     Current Pharmacy:  [id number] - [place], [place] - [address] AT [facility] [address] [place] [address] Phone: [phone] Fax: [phone]    Objective:  Lab Results  Component Value Date   HGBA1C 6.6 (H) 10/06/2022    Lab Results  Component Value Date   CREATININE 0.79 12/09/2022   BUN 19 12/09/2022   NA 138 12/09/2022   K 4.5 12/09/2022   CL 101 12/09/2022   CO2 26 12/09/2022    Lab Results  Component Value Date   CHOL 246 (H) 10/06/2022   HDL 78.40 10/06/2022   LDLCALC 131 (H) 10/06/2022   LDLDIRECT 179.0 09/03/2021   TRIG 185.0 (H) 10/06/2022   CHOLHDL 3 10/06/2022    Medications Reviewed Today     Reviewed by Henrene Pastor, RPH-CPP (Pharmacist) on 12/15/22 at 1013  Med List Status: <None>   Medication Order Taking? Sig Documenting Provider Last Dose Status Informant  ALPRAZolam (XANAX) 0.25 MG tablet [id number] No Take 1 tablet (0.25 mg total)  by mouth daily as needed. Bradd Canary, MD Taking Active   aspirin 325 MG tablet [id number] No Take 325 mg by mouth daily. [provider] Taking Active   atorvastatin (LIPITOR) 80 MG tablet [id number] No TAKE 1 TABLET BY MOUTH DAILY. Bradd Canary, MD Taking Active   Blood Glucose Monitoring Suppl (ONETOUCH VERIO FLEX SYSTEM) w/Device KIT [id number] No Use to check blood glucose 1 to 2 times per day Bradd Canary, MD Taking Active   Cholecalciferol (VITAMIN D3) 125 MCG (5000 UT) CAPS [id number] No Take by mouth 2 (two) times a week. [provider] Taking Active   diclofenac  (VOLTAREN) 75 MG EC tablet [id number] No 1 tablet by mouth every 12 hours as needed with food  Patient not taking: Reported on 11/13/2022   [provider] Not Taking Active   escitalopram (LEXAPRO) 10 MG tablet [id number] No Take 1 tablet (10 mg total) by mouth daily. Bradd Canary, MD Taking Active   ezetimibe (ZETIA) 10 MG tablet [id number] No TAKE 1 TABLET BY MOUTH EVERY DAY Bradd Canary, MD Taking Active   fenofibrate micronized (LOFIBRA) 134 MG capsule [id number] No Take 1 capsule (134 mg total) by mouth daily before breakfast. Bradd Canary, MD Taking Active   furosemide (LASIX) 20 MG tablet [id number] No TAKE 1 TABLET BY MOUTH EVERY DAY Bradd Canary, MD Taking Active   glucose blood (ONETOUCH VERIO) test strip [id number] No Use to check blood glucose 1 to 2 times per day Bradd Canary, MD Taking Active   irbesartan (AVAPRO) 150 MG tablet [id number] No TAKE 1 TABLET BY MOUTH EVERY DAY Bradd Canary, MD Taking Active   lactobacillus acidophilus (BACID) TABS tablet [id number] No Take 2 tablets by mouth 3 (three) times daily. [provider] Taking Active   Lancet Device MISC [id number] No PRN as needed. Bradd Canary, MD Taking Active   Lancets Misc. MISC [id number] No PRN as needed. Bradd Canary, MD Taking Active   leflunomide (ARAVA) 20 MG tablet [id number] No TAKE 1 TABLET BY MOUTH EVERY DAY Deveshwar, Janalyn Rouse, MD Taking Active   levothyroxine (SYNTHROID) 75 MCG tablet [id number] No TAKE 1 TABLET BY MOUTH DAILY BEFORE BREAKFAST. Bradd Canary, MD Taking Active   methocarbamol (ROBAXIN) 500 MG tablet [id number] No Take 1 tablet (500 mg total) by mouth 2 (two) times daily.  Patient not taking: Reported on 12/09/2022   Loetta Rough, MD Not Taking Active   methylPREDNISolone (MEDROL DOSEPAK) 4 MG TBPK tablet [id number]  6 day dose pack - take as directed Felecia Shelling, DPM  Active   metoprolol succinate (TOPROL-XL) 25 MG 24 hr tablet [id number] No Take 1 tablet (25 mg  total) by mouth daily. Take with or immediately following a meal Bradd Canary, MD Taking Active   omeprazole (PRILOSEC) 40 MG capsule [id number] No TAKE 1 CAPSULE BY MOUTH EVERY DAY Bradd Canary, MD Taking Active   OneTouch Delica Lancets 33G [place] [id number] No Use to check blood glucose 1 to 2 times per day Bradd Canary, MD Taking Active   predniSONE (DELTASONE) 5 MG tablet [id number] No Take 4 tablets by mouth daily x2 days, 3 tablets daily x2 days, 2 tablets daily x2 days, 1 tablet daily x2 days prn  Patient not taking: Reported on 11/13/2022   Bradd Canary, MD Not Taking Active   tirzepatide [facility]) 5 MG/0.5ML Pen [id number] No INJECT 5 MG SUBCUTANEOUSLY WEEKLY Bradd Canary, MD Taking  Active   Trospium Chloride 60 MG CP24 [id number] No trospium ER 60 mg capsule,extended release 24 hr  TAKE 1 CAPSULE BY MOUTH EVERY DAY [provider] Taking Active   TURMERIC PO [id number] No Take 1 tablet by mouth 2 (two) times daily. [provider] Taking Active   Upadacitinib ER (RINVOQ) 15 MG TB24 [id number] No Take 1 tablet (15 mg total) by mouth daily. Gearldine Bienenstock, PA-C Taking Active   verapamil (CALAN-SR) 120 MG CR tablet [id number] No TAKE 1 TABLET BY MOUTH EVERYDAY AT BEDTIME Bradd Canary, MD Taking Active               Assessment/Plan:   Diabetes / obesity: Tolerating Mounjaro well. Has lost 17lbs and home blood glucose has improved.  - Patient will finish her current prescription of Mounjaro 5mg  (should complete around 01/17/2023) She will then increase Mounjaro to 7.5mg  weekly. Prescription sent to her pharmacy to be profiled until she need it)  - She will have surgery with general anesthesia 01/06/2023 - she will meet with surgeon later this week. Advised that she should hold Mounjaro for 1 or 2 doses prior to surgery. If she is not given instructions by surgeon, then she will call our office.  - Continue with current diet changes.  - Reviewed home blood  glucose readings and reviewed goals  Fasting blood glucose goal (before meals) = 80 to 130 Blood glucose goal after a meal = less than 180   Hypertension - last blood pressure was elevated but previous 2 BPs in office were at goal.  - Continue to check blood pressure at home. Call office if blood pressure is > 140/90 on a regular basis.  - Continue current blood pressure medications. .  Follow up in 1 month to assess blood glucose and consider Mounjaro titration  Henrene Pastor, PharmD Clinical Pharmacist [facility]

## 2022-12-15 NOTE — Progress Notes (Signed)
Chief Complaint  Patient presents with   Foot Pain    Follow up arthritis midfoot bilateral   "I need the shots again"    Subjective:  62 year old female PMHx rheumatoid arthritis presenting today for follow-up management of chronic bilateral midtarsal pain and arthritis.  She is being seen by rheumatology.  Patient states that the cortisone injections do help for a few months.  She also does not go barefoot and wears good supportive tennis shoes and sneakers  Past Medical History:  Diagnosis Date   Allergic rhinitis 05/31/2012   Breast cancer (HCC)    right breast, dx 10/2021.   Chest pain    PT SAW CARDIOLOGIST DR. Excell Seltzer - STRESS ECHO DONE NEGATIVE - NO PROBLEM SINCE   Chicken pox as achild   Cough 11/29/2012   RESOLVED - IT WAS CAUSED BY LISINOPRIL   COVID-19    11/2022   Elevated LFTs 05/31/2012   Eustachian tube dysfunction 07/27/2017   GERD (gastroesophageal reflux disease)    H/O tobacco use, presenting hazards to health 11/29/2012   Smoked 1 1/2 ppd for 20 years quit in roughtly 2007   Headache 11/25/2015   Hematuria 04/09/2014   Hoarseness 02/04/2012   RESOLVED - THOUGHT TO HAVE BEEN ALLERY RELATED   Hyperglycemia 04/06/2014   Hyperlipidemia    Hyperparathyroidism (HCC)    Hypertension    Obesity, unspecified 05/31/2012   Ocular migraine    Overactive bladder 05/31/2012   Preventative health care 02/04/2012   PTSD (post-traumatic stress disorder)    Rapid heart beat    PT FEELS PROB RELATED TO HYPER PARATHYROIDISM   Rheumatoid arthritis (HCC)    SUI (stress urinary incontinence, female) 02/04/2012   Sees Dr Newt Minion and they have discussed a bladder tack but so far she declines   Past Surgical History:  Procedure Laterality Date   BREAST EXCISIONAL BIOPSY Left    Age 80 Fibroadenoma   BREAST SURGERY     left breast excision of adenoma   btl     COLONOSCOPY  07/2020   FOOT SURGERY     MASTECTOMY Bilateral 11/28/2021   PARATHYROIDECTOMY Right  08/12/2013   Procedure: right inferior frozen section PARATHYROIDECTOMY;  Surgeon: Velora Heckler, MD;  Location: [facility];  Service: General;  Laterality: Right;   PELVIC LAPAROSCOPY  03/2004   TISSUE EXPANDER PLACEMENT Bilateral 08/14/2022   at [facility] REPLACEMENT Right 01/2019   TUBAL LIGATION     WISDOM TOOTH EXTRACTION  30 yrs ago   Allergies  Allergen Reactions   Cefdinir Other (See Comments) and Nausea And Vomiting    Severe Stomach cramps and diarrhea for several days.   Doxycycline Diarrhea   Codeine Nausea And Vomiting   Penicillins     RASH, ITCHING   Simvastatin     myalgias   Other Rash   Objective: Physical Exam General: The patient is alert and oriented x3 in no acute distress.  Dermatology: Skin is cool, dry and supple bilateral lower extremities. Negative for open lesions or macerations.  Vascular: Palpable pedal pulses bilaterally.  Capillary refill WNL.  Clinically no concern for vascular compromise  Neurological: Grossly intact via light touch  Musculoskeletal Exam: Chronic pain on palpation throughout the midtarsal joint bilateral  Radiographic exam B/L feet 04/03/2021 Normal osseous mineralization.  There is some degenerative changes noted throughout the lesser TMT joints consistent with the patient's given history of rheumatoid and osteoarthritis.  No acute fractures identified.  Assessment: 1. s/p  excision of benign skin lesion left plantar foot. DOS: 06/13/2021 2. Chronic bilateral midfoot capsulitis/arthritis  Plan of Care:  -Patient was evaluated.   -Prescription for Medrol Dosepak -Injection of 0.5 cc Celestone Soluspan injection of the bilateral midtarsal joint -Continue close management with rheumatology -Continue custom molded orthotics -Return to clinic as needed, or about 3 months for follow-up care and for follow-up x-rays  *Going to [place] and [place] in October for 20th Anniversary  Felecia Shelling, DPM [facility]  Dr. Felecia Shelling, DPM    [address] [place], [place] [address]                Office [phone]  Fax [phone]

## 2022-12-16 ENCOUNTER — Other Ambulatory Visit (HOSPITAL_COMMUNITY): Payer: Self-pay

## 2022-12-16 ENCOUNTER — Other Ambulatory Visit: Payer: Self-pay

## 2022-12-16 ENCOUNTER — Other Ambulatory Visit: Payer: Self-pay | Admitting: Physician Assistant

## 2022-12-16 ENCOUNTER — Other Ambulatory Visit (HOSPITAL_COMMUNITY): Payer: Self-pay | Admitting: Pharmacy Technician

## 2022-12-16 MED ORDER — RINVOQ 15 MG PO TB24
15.0000 mg | ORAL_TABLET | Freq: Every day | ORAL | 2 refills | Status: DC
  Filled 2022-12-16: qty 30, 30d supply, fill #0
  Filled 2023-01-08: qty 30, 30d supply, fill #1
  Filled 2023-02-10: qty 30, 30d supply, fill #2

## 2022-12-16 NOTE — Progress Notes (Signed)
Specialty Pharmacy Refill Coordination Note  Sandra [name] is a 62 y.o. female contacted today regarding refills of specialty medication(s) Upadacitinib   Patient requested Delivery   Delivery date: 12/23/22   Verified address: [address] [place]   Medication will be filled on 12/22/22.   Refill Request sent to MD.

## 2022-12-16 NOTE — Telephone Encounter (Signed)
Last Fill: 09/04/2022  Labs: 12/09/2022 Absolute monocytes are borderline elevated. Rest of CBC WNL. Alk phos is low-33.   AST and ALT are borderline elevated  TB Gold: 09/04/2022 Neg    Next Visit: 03/18/2023  Last Visit: 12/09/2022  DX:  Rheumatoid arthritis of multiple sites with negative rheumatoid factor   Current Dose per office note 12/09/2022: Rinvoq 15 mg 1 tablet by mouth daily   Okay to refill Rinvoq?

## 2022-12-16 NOTE — Progress Notes (Signed)
Refill received

## 2022-12-17 ENCOUNTER — Ambulatory Visit: Payer: BLUE CROSS/BLUE SHIELD | Admitting: Podiatry

## 2022-12-17 ENCOUNTER — Other Ambulatory Visit (HOSPITAL_COMMUNITY): Payer: Self-pay

## 2022-12-17 DIAGNOSIS — G4733 Obstructive sleep apnea (adult) (pediatric): Secondary | ICD-10-CM | POA: Diagnosis not present

## 2022-12-23 DIAGNOSIS — Z01818 Encounter for other preprocedural examination: Secondary | ICD-10-CM | POA: Diagnosis not present

## 2022-12-24 DIAGNOSIS — I10 Essential (primary) hypertension: Secondary | ICD-10-CM | POA: Diagnosis not present

## 2022-12-24 DIAGNOSIS — G4733 Obstructive sleep apnea (adult) (pediatric): Secondary | ICD-10-CM | POA: Diagnosis not present

## 2022-12-24 DIAGNOSIS — E785 Hyperlipidemia, unspecified: Secondary | ICD-10-CM | POA: Diagnosis not present

## 2022-12-24 DIAGNOSIS — Z853 Personal history of malignant neoplasm of breast: Secondary | ICD-10-CM | POA: Diagnosis not present

## 2022-12-24 DIAGNOSIS — Z8616 Personal history of COVID-19: Secondary | ICD-10-CM | POA: Diagnosis not present

## 2022-12-24 DIAGNOSIS — Z01818 Encounter for other preprocedural examination: Secondary | ICD-10-CM | POA: Diagnosis not present

## 2022-12-24 DIAGNOSIS — Z87891 Personal history of nicotine dependence: Secondary | ICD-10-CM | POA: Diagnosis not present

## 2022-12-25 DIAGNOSIS — Z01810 Encounter for preprocedural cardiovascular examination: Secondary | ICD-10-CM | POA: Diagnosis not present

## 2023-01-06 ENCOUNTER — Other Ambulatory Visit: Payer: Self-pay | Admitting: Family Medicine

## 2023-01-08 ENCOUNTER — Other Ambulatory Visit (HOSPITAL_COMMUNITY): Payer: Self-pay

## 2023-01-08 ENCOUNTER — Other Ambulatory Visit (HOSPITAL_COMMUNITY): Payer: Self-pay | Admitting: Pharmacy Technician

## 2023-01-08 NOTE — Progress Notes (Signed)
Specialty Pharmacy Refill Coordination Note  Sandra [name] is a 62 y.o. female contacted today regarding refills of specialty medication(s) Upadacitinib   Patient requested Delivery   Delivery date: 01/21/23   Verified address: [address]  [place]   Medication will be filled on 01/20/23.

## 2023-01-09 DIAGNOSIS — Z421 Encounter for breast reconstruction following mastectomy: Secondary | ICD-10-CM | POA: Diagnosis not present

## 2023-01-15 HISTORY — PX: PLACEMENT OF BREAST IMPLANTS: SHX6334

## 2023-01-16 DIAGNOSIS — Z09 Encounter for follow-up examination after completed treatment for conditions other than malignant neoplasm: Secondary | ICD-10-CM | POA: Diagnosis not present

## 2023-01-20 ENCOUNTER — Other Ambulatory Visit: Payer: Self-pay

## 2023-01-20 ENCOUNTER — Telehealth: Payer: Self-pay | Admitting: Family Medicine

## 2023-01-20 MED ORDER — IRBESARTAN 150 MG PO TABS: 150.0000 mg | ORAL_TABLET | Freq: Every day | ORAL | 0 refills | Status: DC

## 2023-01-20 NOTE — Addendum Note (Signed)
Addended by: Thelma Barge D on: 01/20/2023 03:14 PM   Modules accepted: Orders

## 2023-01-20 NOTE — Telephone Encounter (Signed)
Prescription Request  01/20/2023  Is this a "Controlled Substance" medicine? No  LOV: 10/06/2022 (pt has appt 02/10/23)   What is the name of the medication or equipment?  irbesartan (AVAPRO) 150 MG tablet  Have you contacted your pharmacy to request a refill? Yes   Which pharmacy would you like this sent to?  [id number] - [place], [place] - [id number] AT [facility] [address] [place] [address] Phone: [phone] Fax: [phone]    Patient notified that their request is being sent to the clinical staff for review and that they should receive a response within 2 business days.   Please advise at [facility] [phone]

## 2023-01-20 NOTE — Telephone Encounter (Signed)
Refill sent in and patient notified

## 2023-01-24 ENCOUNTER — Other Ambulatory Visit: Payer: Self-pay | Admitting: Family Medicine

## 2023-01-29 ENCOUNTER — Other Ambulatory Visit: Payer: Self-pay | Admitting: *Deleted

## 2023-01-29 MED ORDER — PREDNISONE 5 MG PO TABS: ORAL_TABLET | ORAL | 0 refills | Status: DC

## 2023-01-29 NOTE — Telephone Encounter (Signed)
Ok to send in prednisone starting at 20 mg tapering by 5 mg every 4 days.  Take prednisone in the morning with food and avoid the use of NSAIDs.

## 2023-01-29 NOTE — Addendum Note (Signed)
Addended by: Henriette Combs on: 01/29/2023 12:06 PM   Modules accepted: Orders

## 2023-01-29 NOTE — Telephone Encounter (Signed)
Patient contacted the office stating she is having a flare. Patient states it has been going on for 4-5 days. Patient states she is having pain in all of her joints but the pain is worse in her hands, feet and knees. Patient states she is having swelling in her knees. Patient states she is going to be travelling here soon for the Thanksgiving holiday. Patient is on Rinvoq and [place]. Patient denies missing any doses of medication. Patient is requesting a prescription for prednisone. Please advise.

## 2023-01-29 NOTE — Telephone Encounter (Signed)
Left message to advise patient we are going to send in prednisone starting at 20 mg tapering by 5 mg every 4 days. Take prednisone in the morning with food and avoid the use of NSAIDs

## 2023-01-30 DIAGNOSIS — Z09 Encounter for follow-up examination after completed treatment for conditions other than malignant neoplasm: Secondary | ICD-10-CM | POA: Diagnosis not present

## 2023-02-01 ENCOUNTER — Other Ambulatory Visit: Payer: Self-pay | Admitting: Family Medicine

## 2023-02-08 NOTE — Assessment & Plan Note (Signed)
She continues with work with [facility] on reconstruction

## 2023-02-08 NOTE — Assessment & Plan Note (Signed)
Hydrate and monitor 

## 2023-02-08 NOTE — Assessment & Plan Note (Signed)
Well controlled, no changes to meds. Encouraged heart healthy diet such as the DASH diet and exercise as tolerated.  °

## 2023-02-08 NOTE — Assessment & Plan Note (Signed)
hgba1c acceptable, minimize simple carbs. Increase exercise as tolerated.  

## 2023-02-08 NOTE — Assessment & Plan Note (Signed)
Encourage heart healthy diet such as MIND or DASH diet, increase exercise, avoid trans fats, simple carbohydrates and processed foods, consider a krill or fish or flaxseed oil cap daily.  °

## 2023-02-09 DIAGNOSIS — Z853 Personal history of malignant neoplasm of breast: Secondary | ICD-10-CM | POA: Diagnosis not present

## 2023-02-10 ENCOUNTER — Other Ambulatory Visit: Payer: Self-pay

## 2023-02-10 ENCOUNTER — Ambulatory Visit (INDEPENDENT_AMBULATORY_CARE_PROVIDER_SITE_OTHER): Payer: BLUE CROSS/BLUE SHIELD | Admitting: Family Medicine

## 2023-02-10 VITALS — BP 106/70 | HR 82 | Temp 98.4°F | Resp 18 | Ht 65.0 in | Wt 185.0 lb

## 2023-02-10 DIAGNOSIS — Z7985 Long-term (current) use of injectable non-insulin antidiabetic drugs: Secondary | ICD-10-CM | POA: Diagnosis not present

## 2023-02-10 DIAGNOSIS — R252 Cramp and spasm: Secondary | ICD-10-CM | POA: Diagnosis not present

## 2023-02-10 DIAGNOSIS — R739 Hyperglycemia, unspecified: Secondary | ICD-10-CM

## 2023-02-10 DIAGNOSIS — Z23 Encounter for immunization: Secondary | ICD-10-CM

## 2023-02-10 DIAGNOSIS — Z170 Estrogen receptor positive status [ER+]: Secondary | ICD-10-CM

## 2023-02-10 DIAGNOSIS — E1169 Type 2 diabetes mellitus with other specified complication: Secondary | ICD-10-CM

## 2023-02-10 DIAGNOSIS — E782 Mixed hyperlipidemia: Secondary | ICD-10-CM | POA: Diagnosis not present

## 2023-02-10 DIAGNOSIS — C50911 Malignant neoplasm of unspecified site of right female breast: Secondary | ICD-10-CM

## 2023-02-10 DIAGNOSIS — I10 Essential (primary) hypertension: Secondary | ICD-10-CM | POA: Diagnosis not present

## 2023-02-10 DIAGNOSIS — E210 Primary hyperparathyroidism: Secondary | ICD-10-CM | POA: Diagnosis not present

## 2023-02-10 DIAGNOSIS — E785 Hyperlipidemia, unspecified: Secondary | ICD-10-CM | POA: Diagnosis not present

## 2023-02-10 LAB — COMPREHENSIVE METABOLIC PANEL
ALT: 22 U/L (ref 0–35)
AST: 19 U/L (ref 0–37)
Albumin: 4.7 g/dL (ref 3.5–5.2)
Alkaline Phosphatase: 38 U/L — ABNORMAL LOW (ref 39–117)
BUN: 15 mg/dL (ref 6–23)
CO2: 33 meq/L — ABNORMAL HIGH (ref 19–32)
Calcium: 10.4 mg/dL (ref 8.4–10.5)
Chloride: 100 meq/L (ref 96–112)
Creatinine, Ser: 0.82 mg/dL (ref 0.40–1.20)
GFR: 77.4 mL/min (ref >60.00)
Glucose, Bld: 96 mg/dL (ref 70–99)
Potassium: 4.9 meq/L (ref 3.5–5.1)
Sodium: 140 meq/L (ref 135–145)
Total Bilirubin: 0.6 mg/dL (ref 0.2–1.2)
Total Protein: 7.4 g/dL (ref 6.0–8.3)

## 2023-02-10 LAB — CBC WITH DIFFERENTIAL/PLATELET
Basophils Absolute: 0 10*3/uL (ref 0.0–0.1)
Basophils Relative: 0.2 % (ref 0.0–3.0)
Eosinophils Absolute: 0 10*3/uL (ref 0.0–0.7)
Eosinophils Relative: 0.2 % (ref 0.0–5.0)
HCT: 42 % (ref 36.0–46.0)
Hemoglobin: 13.8 g/dL (ref 12.0–15.0)
Lymphocytes Relative: 34.1 % (ref 12.0–46.0)
Lymphs Abs: 2.6 10*3/uL (ref 0.7–4.0)
MCHC: 32.8 g/dL (ref 30.0–36.0)
MCV: 93 fL (ref 78.0–100.0)
Monocytes Absolute: 0.5 10*3/uL (ref 0.1–1.0)
Monocytes Relative: 6.5 % (ref 3.0–12.0)
Neutro Abs: 4.6 10*3/uL (ref 1.4–7.7)
Neutrophils Relative %: 59 % (ref 43.0–77.0)
Platelets: 390 10*3/uL (ref 150.0–400.0)
RBC: 4.52 Mil/uL (ref 3.87–5.11)
RDW: 14.9 % (ref 11.5–15.5)
WBC: 7.8 10*3/uL (ref 4.0–10.5)

## 2023-02-10 LAB — LIPID PANEL
Cholesterol: 210 mg/dL — ABNORMAL HIGH (ref 0–200)
HDL: 73.3 mg/dL (ref >39.00)
LDL Cholesterol: 117 mg/dL — ABNORMAL HIGH (ref 0–99)
NonHDL: 137.01
Total CHOL/HDL Ratio: 3
Triglycerides: 102 mg/dL (ref 0.0–149.0)
VLDL: 20.4 mg/dL (ref 0.0–40.0)

## 2023-02-10 LAB — MAGNESIUM: Magnesium: 2.2 mg/dL (ref 1.5–2.5)

## 2023-02-10 LAB — HEMOGLOBIN A1C: Hgb A1c MFr Bld: 5.9 % (ref 4.6–6.5)

## 2023-02-10 LAB — TSH: TSH: 0.74 u[IU]/mL (ref 0.35–5.50)

## 2023-02-10 NOTE — Progress Notes (Signed)
Specialty Pharmacy Refill Coordination Note  NATALLIA KAPNER is a 62 y.o. female contacted today regarding refills of specialty medication(s) Upadacitinib   Patient requested Delivery   Delivery date: 02/19/23   Verified address: [address]  [place]   Medication will be filled on 02/18/23.

## 2023-02-10 NOTE — Patient Instructions (Signed)

## 2023-02-11 ENCOUNTER — Encounter: Payer: Self-pay | Admitting: Family Medicine

## 2023-02-11 LAB — PARATHYROID HORMONE, INTACT (NO CA): PTH: 69 pg/mL (ref 16–77)

## 2023-02-11 NOTE — Progress Notes (Signed)
Subjective:    Patient ID: Sandra [name], female    DOB: 17-Feb-1961, 62 y.o.   MRN: [id number]  Chief Complaint  Patient presents with  . Follow-up    4 month    HPI Discussed the use of AI scribe software for clinical note transcription with the patient, who gave verbal consent to proceed.  History of Present Illness   The patient, with a history of type 2 diabetes and breast cancer, presents for a routine follow-up. They report a positive response to [facility], with significant weight loss from 207 to 185 pounds and no major gastrointestinal side effects. The patient also mentions a disciplined approach to diet during the holiday season, with controlled portion sizes and limited sugar intake. They are currently on a 5mg  dose of Mounjaro and plan to increase to 7.5mg  in two weeks.  In addition to diabetes management, the patient discusses their recent experience with COVID-19 in September. They describe symptoms of fatigue and congestion, but no cough or shortness of breath. The patient managed their symptoms at home with rest and hydration.  The patient also provides an update on their breast cancer treatment. They have undergone bilateral mastectomies and now have implants. They are considering further procedures, including fat grafting and 3D tattooing for nipple reconstruction, but plan to finalize these decisions after achieving their weight loss goals.        Past Medical History:  Diagnosis Date  . Allergic rhinitis 05/31/2012  . Breast cancer (HCC)    right breast, dx 10/2021.  Marland Kitchen Chest pain    PT SAW CARDIOLOGIST DR. Excell Seltzer - STRESS ECHO DONE NEGATIVE - NO PROBLEM SINCE  . Chicken pox as achild  . Cough 11/29/2012   RESOLVED - IT WAS CAUSED BY LISINOPRIL  . COVID-19    11/2022  . Elevated LFTs 05/31/2012  . Eustachian tube dysfunction 07/27/2017  . GERD (gastroesophageal reflux disease)   . H/O tobacco use, presenting hazards to health 11/29/2012   Smoked 1 1/2 ppd  for 20 years quit in roughtly 2007  . Headache 11/25/2015  . Hematuria 04/09/2014  . Hoarseness 02/04/2012   RESOLVED - THOUGHT TO HAVE BEEN ALLERY RELATED  . Hyperglycemia 04/06/2014  . Hyperlipidemia   . Hyperparathyroidism (HCC)   . Hypertension   . Obesity, unspecified 05/31/2012  . Ocular migraine   . Overactive bladder 05/31/2012  . Preventative health care 02/04/2012  . PTSD (post-traumatic stress disorder)   . Rapid heart beat    PT FEELS PROB RELATED TO HYPER PARATHYROIDISM  . Rheumatoid arthritis (HCC)   . SUI (stress urinary incontinence, female) 02/04/2012   Sees Dr Newt Minion and they have discussed a bladder tack but so far she declines    Past Surgical History:  Procedure Laterality Date  . BREAST EXCISIONAL BIOPSY Left    Age 47 Fibroadenoma  . BREAST SURGERY     left breast excision of adenoma  . btl    . COLONOSCOPY  07/2020  . FOOT SURGERY    . MASTECTOMY Bilateral 11/28/2021  . PARATHYROIDECTOMY Right 08/12/2013   Procedure: right inferior frozen section PARATHYROIDECTOMY;  Surgeon: Velora Heckler, MD;  Location: [facility];  Service: General;  Laterality: Right;  . PELVIC LAPAROSCOPY  03/2004  . TISSUE EXPANDER PLACEMENT Bilateral 08/14/2022   at [facility]  . TOTAL SHOULDER REPLACEMENT Right 01/2019  . TUBAL LIGATION    . WISDOM TOOTH EXTRACTION  30 yrs ago    Family History  Problem Relation Age of  Onset  . Hypertension Mother 22       alive  . Renal cancer Mother        renal  . Hyperlipidemia Mother   . Osteoporosis Mother        humerus   . Macular degeneration Mother   . Tremor Mother   . Parkinson's disease Mother   . Hypertension Father 11       alive  . Atrial fibrillation Father   . Colon cancer Father        colon  . Hyperlipidemia Father   . Stroke Father   . Alzheimer's disease Father   . Hypertension Sister   . Hypertension Brother 56       alive  . Tremor Brother   . Asthma Son   . Eczema Son   . Healthy Son   . Heart  disease Paternal Aunt   . Heart disease Paternal Uncle   . Lung cancer Maternal Grandfather        lung/ smoker  . Emphysema Maternal Grandfather   . Heart disease Paternal Grandmother   . Other Paternal Grandfather        black lung  . Heart disease Paternal Grandfather     Social History   Socioeconomic History  . Marital status: Married    Spouse name: Not on file  . Number of children: 2  . Years of education: Not on file  . Highest education level: Associate degree: academic program  Occupational History  . Occupation: Teacher, adult education: Advertising copywriter  Tobacco Use  . Smoking status: Former    Current packs/day: 0.00    Average packs/day: 1 pack/day for 20.0 years (20.0 ttl pk-yrs)    Types: Cigarettes    Start date: 03/10/1985    Quit date: 03/10/2005    Years since quitting: 17.9    Passive exposure: Never  . Smokeless tobacco: Never  Vaping Use  . Vaping status: Never Used  Substance and Sexual Activity  . Alcohol use: Not Currently  . Drug use: No  . Sexual activity: Yes    Partners: Male  Other Topics Concern  . Not on file  Social History Narrative  . Not on file   Social Determinants of Health   Financial Resource Strain: Low Risk  (02/03/2023)   Overall Financial Resource Strain (CARDIA)   . Difficulty of Paying Living Expenses: Not hard at all  Food Insecurity: No Food Insecurity (02/03/2023)   Hunger Vital Sign   . Worried About Programme researcher, broadcasting/film/video in the Last Year: Never true   . Ran Out of Food in the Last Year: Never true  Transportation Needs: No Transportation Needs (02/03/2023)   PRAPARE - Transportation   . Lack of Transportation (Medical): No   . Lack of Transportation (Non-Medical): No  Physical Activity: Insufficiently Active (02/03/2023)   Exercise Vital Sign   . Days of Exercise per Week: 2 days   . Minutes of Exercise per Session: 10 min  Stress: Stress Concern Present (02/03/2023)   [employer] of Occupational Health -  Occupational Stress Questionnaire   . Feeling of Stress : To some extent  Social Connections: Moderately Integrated (02/03/2023)   Social Connection and Isolation Panel [NHANES]   . Frequency of Communication with Friends and Family: More than three times a week   . Frequency of Social Gatherings with Friends and Family: Once a week   . Attends Religious Services: More than 4 times per year   .  Active Member of Clubs or Organizations: No   . Attends Banker Meetings: Not on file   . Marital Status: Married  Catering manager Violence: Unknown (06/12/2021)   Received from [facility]   HITS   . Physically Hurt: Not on file   . Insult or Talk Down To: Not on file   . Threaten Physical Harm: Not on file   . Scream or Curse: Not on file    Outpatient Medications Prior to Visit  Medication Sig Dispense Refill  . ALPRAZolam (XANAX) 0.25 MG tablet Take 1 tablet (0.25 mg total) by mouth daily as needed. 15 tablet 0  . aspirin 325 MG tablet Take 325 mg by mouth daily.    Marland Kitchen atorvastatin (LIPITOR) 80 MG tablet TAKE 1 TABLET BY MOUTH EVERY DAY 90 tablet 1  . Blood Glucose Monitoring Suppl (ONETOUCH VERIO FLEX SYSTEM) w/Device KIT Use to check blood glucose 1 to 2 times per day 1 kit 0  . Cholecalciferol (VITAMIN D3) 125 MCG (5000 UT) CAPS Take by mouth 2 (two) times a week.    . diclofenac (VOLTAREN) 75 MG EC tablet     . escitalopram (LEXAPRO) 10 MG tablet Take 1 tablet (10 mg total) by mouth daily. 90 tablet 1  . ezetimibe (ZETIA) 10 MG tablet TAKE 1 TABLET BY MOUTH EVERY DAY 90 tablet 1  . fenofibrate micronized (LOFIBRA) 134 MG capsule Take 1 capsule (134 mg total) by mouth daily before breakfast. 90 capsule 0  . furosemide (LASIX) 20 MG tablet TAKE 1 TABLET BY MOUTH EVERY DAY 90 tablet 1  . glucose blood (ONETOUCH VERIO) test strip Use to check blood glucose 1 to 2 times per day 50 each 5  . irbesartan (AVAPRO) 150 MG tablet Take 1 tablet (150 mg total) by mouth  daily. 90 tablet 0  . lactobacillus acidophilus (BACID) TABS tablet Take 2 tablets by mouth 3 (three) times daily.    Demetra Shiner Device MISC PRN as needed. 1 each 0  . Lancets Misc. MISC PRN as needed. 100 each 3  . leflunomide (ARAVA) 20 MG tablet TAKE 1 TABLET BY MOUTH EVERY DAY 90 tablet 0  . levothyroxine (SYNTHROID) 75 MCG tablet Take 1 tablet (75 mcg total) by mouth daily before breakfast. 90 tablet 1  . methocarbamol (ROBAXIN) 500 MG tablet Take 1 tablet (500 mg total) by mouth 2 (two) times daily. 20 tablet 0  . methylPREDNISolone (MEDROL DOSEPAK) 4 MG TBPK tablet 6 day dose pack - take as directed 21 tablet 0  . metoprolol succinate (TOPROL-XL) 25 MG 24 hr tablet Take 1 tablet (25 mg total) by mouth daily. Take with or immediately following a meal 90 tablet 1  . omeprazole (PRILOSEC) 40 MG capsule TAKE 1 CAPSULE BY MOUTH EVERY DAY 90 capsule 1  . OneTouch Delica Lancets 33G MISC Use to check blood glucose 1 to 2 times per day 100 each 5  . predniSONE (DELTASONE) 5 MG tablet Take 4 tablets by mouth daily x2 days, 3 tablets daily x2 days, 2 tablets daily x2 days, 1 tablet daily x2 days prn    . predniSONE (DELTASONE) 5 MG tablet Take 4 tabs po x 4 days, 3  tabs po x 4 days, 2  tabs po x 4 days, 1  tab po x 4 days 40 tablet 0  . tirzepatide (MOUNJARO) 7.5 MG/0.5ML Pen Inject 7.5 mg into the skin once a week. Start after you complete current prescription of Mounjaro 5mg   2 mL 1  . Trospium Chloride 60 MG CP24 trospium ER 60 mg capsule,extended release 24 hr  TAKE 1 CAPSULE BY MOUTH EVERY DAY    . TURMERIC PO Take 1 tablet by mouth 2 (two) times daily.    Marland Kitchen Upadacitinib ER (RINVOQ) 15 MG TB24 Take 1 tablet (15 mg total) by mouth daily. 30 tablet 2  . verapamil (CALAN-SR) 120 MG CR tablet TAKE 1 TABLET BY MOUTH EVERYDAY AT BEDTIME 90 tablet 0  . tirzepatide (MOUNJARO) 5 MG/0.5ML Pen INJECT 5 MG SUBCUTANEOUSLY WEEKLY 2 mL 1   No facility-administered medications prior to visit.    Allergies   Allergen Reactions  . Cefdinir Other (See Comments) and Nausea And Vomiting    Severe Stomach cramps and diarrhea for several days.  . Doxycycline Diarrhea  . Codeine Nausea And Vomiting  . Penicillins     RASH, ITCHING  . Simvastatin     myalgias  . Other Rash    Review of Systems  Constitutional:  Negative for fever and malaise/fatigue.  HENT:  Negative for congestion.   Eyes:  Negative for blurred vision.  Respiratory:  Negative for shortness of breath.   Cardiovascular:  Negative for chest pain, palpitations and leg swelling.  Gastrointestinal:  Negative for abdominal pain, blood in stool and nausea.  Genitourinary:  Negative for dysuria and frequency.  Musculoskeletal:  Positive for myalgias. Negative for falls.  Skin:  Negative for rash.  Neurological:  Negative for dizziness, loss of consciousness and headaches.  Endo/Heme/Allergies:  Negative for environmental allergies.  Psychiatric/Behavioral:  Negative for depression. The patient is not nervous/anxious.       Objective:    Physical Exam Constitutional:      General: She is not in acute distress.    Appearance: Normal appearance. She is well-developed. She is not toxic-appearing.  HENT:     Head: Normocephalic and atraumatic.     Right Ear: External ear normal.     Left Ear: External ear normal.     Nose: Nose normal.  Eyes:     General:        Right eye: No discharge.        Left eye: No discharge.     Conjunctiva/sclera: Conjunctivae normal.  Neck:     Thyroid: No thyromegaly.  Cardiovascular:     Rate and Rhythm: Normal rate and regular rhythm.     Heart sounds: Normal heart sounds. No murmur heard. Pulmonary:     Effort: Pulmonary effort is normal. No respiratory distress.     Breath sounds: Normal breath sounds.  Abdominal:     General: Bowel sounds are normal.     Palpations: Abdomen is soft.     Tenderness: There is no abdominal tenderness. There is no guarding.  Musculoskeletal:         General: Normal range of motion.     Cervical back: Neck supple.  Lymphadenopathy:     Cervical: No cervical adenopathy.  Skin:    General: Skin is warm and dry.  Neurological:     Mental Status: She is alert and oriented to person, place, and time.  Psychiatric:        Mood and Affect: Mood normal.        Behavior: Behavior normal.        Thought Content: Thought content normal.        Judgment: Judgment normal.   BP 106/70 (BP Location: Left Arm, Patient Position: Sitting, Cuff Size: Large)  Pulse 82   Temp 98.4 F (36.9 C) (Oral)   Resp 18   Ht 5\' 5"  (1.651 m)   Wt 185 lb (83.9 kg)   LMP 04/01/2011   SpO2 96%   BMI 30.79 kg/m  Wt Readings from Last 3 Encounters:  02/10/23 185 lb (83.9 kg)  12/09/22 189 lb 9.6 oz (86 kg)  10/06/22 206 lb (93.4 kg)    Diabetic Foot Exam - Simple   No data filed    Lab Results  Component Value Date   WBC 7.8 02/10/2023   HGB 13.8 02/10/2023   HCT 42.0 02/10/2023   PLT 390.0 02/10/2023   GLUCOSE 96 02/10/2023   CHOL 210 (H) 02/10/2023   TRIG 102.0 02/10/2023   HDL 73.30 02/10/2023   LDLDIRECT 179.0 09/03/2021   LDLCALC 117 (H) 02/10/2023   ALT 22 02/10/2023   AST 19 02/10/2023   NA 140 02/10/2023   K 4.9 02/10/2023   CL 100 02/10/2023   CREATININE 0.82 02/10/2023   BUN 15 02/10/2023   CO2 33 (H) 02/10/2023   TSH 0.74 02/10/2023   HGBA1C 5.9 02/10/2023    Lab Results  Component Value Date   TSH 0.74 02/10/2023   Lab Results  Component Value Date   WBC 7.8 02/10/2023   HGB 13.8 02/10/2023   HCT 42.0 02/10/2023   MCV 93.0 02/10/2023   PLT 390.0 02/10/2023   Lab Results  Component Value Date   NA 140 02/10/2023   K 4.9 02/10/2023   CO2 33 (H) 02/10/2023   GLUCOSE 96 02/10/2023   BUN 15 02/10/2023   CREATININE 0.82 02/10/2023   BILITOT 0.6 02/10/2023   ALKPHOS 38 (L) 02/10/2023   AST 19 02/10/2023   ALT 22 02/10/2023   PROT 7.4 02/10/2023   ALBUMIN 4.7 02/10/2023   CALCIUM 10.4 02/10/2023   ANIONGAP 10  11/01/2020   EGFR 86 12/09/2022   GFR 77.40 02/10/2023   Lab Results  Component Value Date   CHOL 210 (H) 02/10/2023   Lab Results  Component Value Date   HDL 73.30 02/10/2023   Lab Results  Component Value Date   LDLCALC 117 (H) 02/10/2023   Lab Results  Component Value Date   TRIG 102.0 02/10/2023   Lab Results  Component Value Date   CHOLHDL 3 02/10/2023   Lab Results  Component Value Date   HGBA1C 5.9 02/10/2023       Assessment & Plan:  Primary hypertension Assessment & Plan: Well controlled, no changes to meds. Encouraged heart healthy diet such as the DASH diet and exercise as tolerated.   Orders: -     Comprehensive metabolic panel -     CBC with Differential/Platelet -     TSH  Hyperlipidemia associated with type 2 diabetes mellitus (HCC) Assessment & Plan: hgba1c acceptable, minimize simple carbs. Increase exercise as tolerated.   Orders: -     Lipid panel -     Hemoglobin A1c  Mixed hyperlipidemia Assessment & Plan: Encourage heart healthy diet such as MIND or DASH diet, increase exercise, avoid trans fats, simple carbohydrates and processed foods, consider a krill or fish or flaxseed oil cap daily.    Orders: -     Lipid panel  Malignant neoplasm of right breast in female, estrogen receptor positive, unspecified site of breast [facility]) Assessment & Plan: She continues with work with [facility] on reconstruction   Muscle cramp Assessment & Plan: Hydrate and monitor   Orders: -  Magnesium  Hyperparathyroidism, primary (HCC) -     Parathyroid hormone, intact (no Ca)  Need for influenza vaccination -     Flu vaccine trivalent PF, 6mos and older(Flulaval,Afluria,Fluarix,Fluzone)    Assessment and Plan    Type 2 Diabetes Mellitus Improved control and weight loss with Mounjaro. No major gastrointestinal side effects reported. Currently on 5mg , planning to increase to 7.5mg  in two weeks. -Continue Mounjaro 5mg  daily for two weeks, then  increase to 7.5mg  daily. -Check A1c and cholesterol levels today. -Consider further dose increase if no side effects and patient agrees.  Breast Cancer Survivorship Completed bilateral mastectomies and implant reconstruction. Possible future fat grafting and 3D tattooing for nipples. -Continue current follow-up plan with plastic surgeon and breast clinic.  COVID-19 Recovered from infection in August. Discussed importance of early treatment with Paxlovid if re-infected. -If re-infected, initiate Paxlovid within 5 days of symptom onset.  General Health Maintenance -Received flu shot. -Plan for physical exam in 5-6 months.         Danise Edge, MD

## 2023-02-12 NOTE — Telephone Encounter (Signed)
Called patient. Instructions given per provider's order

## 2023-02-13 ENCOUNTER — Other Ambulatory Visit: Payer: Self-pay | Admitting: Family Medicine

## 2023-02-18 ENCOUNTER — Other Ambulatory Visit: Payer: Self-pay

## 2023-02-22 DIAGNOSIS — G4733 Obstructive sleep apnea (adult) (pediatric): Secondary | ICD-10-CM | POA: Diagnosis not present

## 2023-02-23 ENCOUNTER — Other Ambulatory Visit: Payer: Self-pay | Admitting: Family Medicine

## 2023-02-23 ENCOUNTER — Telehealth: Payer: Self-pay | Admitting: Family Medicine

## 2023-02-23 MED ORDER — TIRZEPATIDE 7.5 MG/0.5ML ~~LOC~~ SOAJ: 7.5000 mg | SUBCUTANEOUS | 2 refills | Status: DC

## 2023-02-23 NOTE — Telephone Encounter (Signed)
Prescription Request  02/23/2023  Is this a "Controlled Substance" medicine? No  LOV: 02/10/2023  What is the name of the medication or equipment?  tirzepatide [facility]) 7.5 MG/0.5ML Pen   Have you contacted your pharmacy to request a refill? No   Which pharmacy would you like this sent to?  [id number] - [place], [place] - [id number] AT [facility] [place] [place] [address] Phone: [phone] Fax: [phone]      Patient notified that their request is being sent to the clinical staff for review and that they should receive a response within 2 business days.   Please advise at Mobile [phone] (mobile)

## 2023-02-24 ENCOUNTER — Other Ambulatory Visit: Payer: Self-pay | Admitting: Family Medicine

## 2023-02-24 MED ORDER — TIRZEPATIDE 10 MG/0.5ML ~~LOC~~ SOAJ: 10.0000 mg | SUBCUTANEOUS | 2 refills | Status: DC

## 2023-02-24 NOTE — Telephone Encounter (Signed)
Pt called to advise pharmacy will not refill the mounjaro 7.5 mg until January. Pt is going out of town on 12/21 for 2 weeks and only has one injection left. Pt said if Dr. Abner Greenspan sends in the next dose up from 7.5 mg the insurance will have to cover it. Pt does not want to be out of her medication for 3 weeks. [facility] [address] and advise pt.

## 2023-02-25 NOTE — Telephone Encounter (Signed)
Called patient this morning and informed her refill had been sent

## 2023-03-02 ENCOUNTER — Other Ambulatory Visit: Payer: Self-pay

## 2023-03-03 ENCOUNTER — Other Ambulatory Visit: Payer: Self-pay | Admitting: Rheumatology

## 2023-03-05 NOTE — Telephone Encounter (Signed)
Last Fill: 12/01/2022  Labs: 02/10/2023 CO2 33, Alk. Phos 38  Next Visit: 03/18/2023  Last Visit: 12/09/2022  DX: Rheumatoid arthritis of multiple sites with negative rheumatoid factor   Current Dose per office note 12/09/2022: Arava 20 mg 1 tablet by mouth daily   Okay to refill Arava ?

## 2023-03-06 NOTE — Progress Notes (Signed)
 Office Visit Note  Patient: Sandra [name]             Date of Birth: 11-09-1960           MRN: [id number]             PCP: Domenica Harlene DELENA, MD Referring: Domenica Harlene DELENA, MD Visit Date: 03/18/2023 Occupation: @GUAROCC @  Subjective:  Medication monitoring   History of Present Illness: Sandra [name] is a 62 y.o. female with history of seronegative rheumatoid arthritis, osteoarthritis, and osteoporosis.  Patient is prescribed Rinvoq  15 mg 1 tablet by mouth daily and Arava  20 mg 1 tablet by mouth daily.  She is currently holding both medications due to being prescribed a zpak for walking pneumonia.  She started the zpak yesterday and will be monitoring her symptoms closely.  She denies any signs or symptoms of a rheumatoid arthritis flare.  She denies any joint swelling at this time.  She continues to experience intermittent aching and joint stiffness but overall her symptoms have been manageable.  She requested a refill of Rinvoq  to be sent to the pharmacy which she plans on resuming once her infection has resolved.  She denies any upcoming surgical procedures.  She denies any new medical conditions.  Patient remains on Mounjaro  and has lost 30 pounds.  Patient reports that when she reaches her goal weight she is considering liposuction.      Activities of Daily Living:  Patient reports morning stiffness for 2 hours.   Patient Denies nocturnal pain.  Difficulty dressing/grooming: Denies Difficulty climbing stairs: Denies Difficulty getting out of chair: Denies Difficulty using hands for taps, buttons, cutlery, and/or writing: Reports  Review of Systems  Constitutional:  Positive for fatigue.  HENT:  Positive for mouth dryness. Negative for mouth sores.   Eyes:  Positive for dryness.  Respiratory:  Negative for shortness of breath.   Cardiovascular:  Negative for chest pain and palpitations.  Gastrointestinal:  Negative for blood in stool, constipation and diarrhea.   Endocrine: Negative for increased urination.  Genitourinary:  Negative for involuntary urination.  Musculoskeletal:  Positive for morning stiffness. Negative for joint pain, gait problem, joint pain, joint swelling, myalgias, muscle weakness, muscle tenderness and myalgias.  Skin:  Negative for color change, rash, hair loss and sensitivity to sunlight.  Allergic/Immunologic: Negative for susceptible to infections.  Neurological:  Negative for dizziness and headaches.  Hematological:  Negative for swollen glands.  Psychiatric/Behavioral:  Positive for sleep disturbance. Negative for depressed mood. The patient is not nervous/anxious.     PMFS History:  Patient Active Problem List   Diagnosis Date Noted   Nodule of soft tissue 10/06/2022   Osteoporosis 06/04/2022   Hashimoto's thyroiditis 04/08/2022   Bronchitis 01/09/2022   H/O mastectomy, bilateral 11/29/2021   Malignant neoplasm of right breast in female, estrogen receptor positive (HCC) 10/14/2021   Insomnia 09/04/2021   Osteopenia 09/04/2021   Pulmonary nodule 03/29/2021   Rheumatoid arthritis (HCC) 12/02/2019   Anxiety and depression 08/01/2019   Arthralgia 05/04/2019   OSA (obstructive sleep apnea) 05/03/2019   Thyroiditis 12/20/2018   Muscle cramp 12/20/2018   Eustachian tube dysfunction 07/27/2017   Headache 11/25/2015   Acute bacterial sinusitis 03/26/2015   Hyperglycemia 04/06/2014   Hyperparathyroidism, primary (HCC) 07/12/2013   Hypercalcemia 12/16/2012   H/O tobacco use, presenting hazards to health 11/29/2012   Overactive bladder 05/31/2012   Elevated LFTs 05/31/2012   Obesity (BMI 30-39.9) 05/31/2012   Allergic rhinitis 05/31/2012   Preventative  health care 02/04/2012   GERD (gastroesophageal reflux disease)    Rotator cuff syndrome of right shoulder 06/11/2011   Hypertension 01/03/2011   Hyperlipidemia 01/03/2011   KNEE PAIN, BILATERAL 09/28/2008   ANSERINE BURSITIS 09/28/2008   CAVUS DEFORMITY OF FOOT,  ACQUIRED 09/28/2008    Past Medical History:  Diagnosis Date   Allergic rhinitis 05/31/2012   Breast cancer (HCC)    right breast, dx 10/2021.   Chest pain    PT SAW CARDIOLOGIST DR. WONDA - STRESS ECHO DONE NEGATIVE - NO PROBLEM SINCE   Chicken pox as achild   Cough 11/29/2012   RESOLVED - IT WAS CAUSED BY LISINOPRIL   COVID-19    11/2022   Elevated LFTs 05/31/2012   Eustachian tube dysfunction 07/27/2017   GERD (gastroesophageal reflux disease)    H/O tobacco use, presenting hazards to health 11/29/2012   Smoked 1 1/2 ppd for 20 years quit in roughtly 2007   Headache 11/25/2015   Hematuria 04/09/2014   Hoarseness 02/04/2012   RESOLVED - THOUGHT TO HAVE BEEN ALLERY RELATED   Hyperglycemia 04/06/2014   Hyperlipidemia    Hyperparathyroidism (HCC)    Hypertension    Obesity, unspecified 05/31/2012   Ocular migraine    Overactive bladder 05/31/2012   Preventative health care 02/04/2012   PTSD (post-traumatic stress disorder)    Rapid heart beat    PT FEELS PROB RELATED TO HYPER PARATHYROIDISM   Rheumatoid arthritis (HCC)    SUI (stress urinary incontinence, female) 02/04/2012   Sees Dr Scottie and they have discussed a bladder tack but so far she declines   Walking pneumonia 03/17/2023    Family History  Problem Relation Age of Onset   Hypertension Mother 27       alive   Renal cancer Mother        renal   Hyperlipidemia Mother    Osteoporosis Mother        humerus    Macular degeneration Mother    Tremor Mother    Parkinson's disease Mother    Hypertension Father 45       alive   Atrial fibrillation Father    Colon cancer Father        colon   Hyperlipidemia Father    Stroke Father    Alzheimer's disease Father    Hypertension Sister    Hypertension Brother 45       alive   Tremor Brother    Asthma Son    Eczema Son    Healthy Son    Heart disease Paternal Aunt    Heart disease Paternal Uncle    Lung cancer Maternal Grandfather        lung/ smoker    Emphysema Maternal Grandfather    Heart disease Paternal Grandmother    Other Paternal Grandfather        black lung   Heart disease Paternal Grandfather    Past Surgical History:  Procedure Laterality Date   BREAST EXCISIONAL BIOPSY Left    Age 89 Fibroadenoma   BREAST SURGERY     left breast excision of adenoma   btl     COLONOSCOPY  07/2020   FOOT SURGERY     MASTECTOMY Bilateral 11/28/2021   PARATHYROIDECTOMY Right 08/12/2013   Procedure: right inferior frozen section PARATHYROIDECTOMY;  Surgeon: Krystal CHRISTELLA Spinner, MD;  Location: [facility];  Service: General;  Laterality: Right;   PELVIC LAPAROSCOPY  03/2004   PLACEMENT OF BREAST IMPLANTS  01/15/2023   History of  breast cancer   TISSUE EXPANDER PLACEMENT Bilateral 08/14/2022   at [facility] REPLACEMENT Right 01/2019   TUBAL LIGATION     WISDOM TOOTH EXTRACTION  30 yrs ago   Social History   Social History Narrative   Not on file   Immunization History  Administered Date(s) Administered   Hepatitis A, Adult 09/04/2016, 03/27/2017   Influenza Split 12/09/2011   Influenza, Seasonal, Injecte, Preservative Fre 02/10/2023   Influenza,inj,Quad PF,6+ Mos 11/29/2012, 11/01/2013, 11/20/2015, 12/29/2017, 04/03/2019, 12/13/2019, 03/28/2021   PFIZER(Purple Top)SARS-COV-2 Vaccination 05/20/2019, 06/15/2019, 01/11/2020   Pneumococcal Conjugate-13 03/28/2021   Pneumococcal Polysaccharide-23 08/01/2019   Tdap 06/07/2015   Zoster Recombinant(Shingrix) 08/01/2019, 10/03/2020     Objective: Vital Signs: BP 131/87 (BP Location: Left Arm, Patient Position: Sitting, Cuff Size: Normal)   Pulse 91   Resp 14   Ht 5' 5 (1.651 m)   Wt 180 lb (81.6 kg)   LMP 04/01/2011   BMI 29.95 kg/m    Physical Exam Vitals and nursing note reviewed.  Constitutional:      Appearance: She is well-developed.  HENT:     Head: Normocephalic and atraumatic.  Eyes:     Conjunctiva/sclera: Conjunctivae normal.  Cardiovascular:     Rate  and Rhythm: Normal rate and regular rhythm.     Heart sounds: Normal heart sounds.  Pulmonary:     Effort: Pulmonary effort is normal.     Breath sounds: Normal breath sounds.  Abdominal:     General: Bowel sounds are normal.     Palpations: Abdomen is soft.  Musculoskeletal:     Cervical back: Normal range of motion.  Lymphadenopathy:     Cervical: No cervical adenopathy.  Skin:    General: Skin is warm and dry.     Capillary Refill: Capillary refill takes less than 2 seconds.  Neurological:     Mental Status: She is alert and oriented to person, place, and time.  Psychiatric:        Behavior: Behavior normal.      Musculoskeletal Exam: C-spine, thoracic spine, lumbar spine good range of motion.  Right shoulder replacement has good range of motion.  Left shoulder with good range of motion.  Elbow joints, wrist joints, MCPs, PIPs, DIPs have good range of motion with no synovitis.  Complete fist formation bilaterally.  PIP and DIP thickening consistent with osteoarthritis of both hands.  Hip joints have good range of motion with no discomfort currently.  Knee joints have good range of motion with no warmth or effusion.  Ankle joints have good range of motion no tenderness or joint swelling.  No tenderness or synovitis over MTP joints.  CDAI Exam: CDAI Score: -- Patient Global: --; Provider Global: -- Swollen: --; Tender: -- Joint Exam 03/18/2023   No joint exam has been documented for this visit   There is currently no information documented on the homunculus. Go to the Rheumatology activity and complete the homunculus joint exam.  Investigation: No additional findings.  Imaging: No results found.  Recent Labs: Lab Results  Component Value Date   WBC 7.8 02/10/2023   HGB 13.8 02/10/2023   PLT 390.0 02/10/2023   NA 140 02/10/2023   K 4.9 02/10/2023   CL 100 02/10/2023   CO2 33 (H) 02/10/2023   GLUCOSE 96 02/10/2023   BUN 15 02/10/2023   CREATININE 0.82 02/10/2023    BILITOT 0.6 02/10/2023   ALKPHOS 38 (L) 02/10/2023   AST 19 02/10/2023   ALT 22  02/10/2023   PROT 7.4 02/10/2023   ALBUMIN 4.7 02/10/2023   CALCIUM  10.4 02/10/2023   GFRAA 117 08/31/2020   QFTBGOLDPLUS NEGATIVE 09/04/2022    Speciality Comments: Orencia  stoped 10/04/20 Rinvoq  started 10/11/20  Procedures:  No procedures performed Allergies: Cefdinir, Doxycycline , Codeine, Penicillins, Simvastatin , and Other   Assessment / Plan:     Visit Diagnoses: Rheumatoid arthritis of multiple sites with negative rheumatoid factor (HCC): She has no joint tenderness or synovitis on examination today.  She has not had any signs or symptoms of a rheumatoid arthritis flare recently.  She is prescribed Rinvoq  15 mg 1 tablet by mouth daily and Arava   20 mg daily--both medications are currently on hold while taking a Z-Pak for walking pneumonia.  She is not experiencing any increased arthralgias or joint inflammation while holding Rinvoq  and arava .  She will notify us  if she develops any signs or symptoms of a flare.  She will follow-up in the office in 5 months or sooner if needed.  High risk medication use: Rinvoq  15 mg 1 tablet by mouth daily and Arava  20 mg daily. Rinvoq  initially stated July 2022-gaps in therapy due to breast cancer diagnosis/undergoing mastectomy/recurrent seromas/reconstructive surgery.  No upcoming surgical procedures scheduled.  CBC and CMP updated on 02/10/23.  Her next lab work will be due in March and every 3 months  TB gold negative on 09/04/22. Currently taking a zpak for walking pneumonia--she is holding rinvoq  and arava .  Discussed the importance of holding rinvoq  and arava  if she develops signs or symptoms of an infection and to resume once the infection has completely cleared.  Post-menopausal osteoporosis: DEXA updated on 09/09/21: The BMD measured at Forearm Radius 33% is 0.741 g/cm2 with a T-score of -1.5. Previous DEXA on 08/12/19:Radius 33% is 0.751 g/cm2 with a T-score of  -1.4. Bilateral sacral alar fracture after stepping in a hole in January 2024.   History of left hip fracture-April 2024.  History of recurrent prednisone  tapers and frequent cortisone injections. History of hyperparathyroidism--history of right parathyroidectomy 2015. History of Hashimoto's thyroiditis--remains on Synthroid . Previous smoker. History of GERD--not a good candidate for oral bisphosphonate therapy.  History of breast cancer--plan avoiding Tymlos/Forteo.  Plan to avoid Evenity given risk for major cardiovascular events while on Rinvoq . IV reclast  administered 06/09/22-no adverse effects.  She is taking vitamin D  5,000 twice weekly. She is taking a daily calcium  supplement.  Discussed the importance of trying to avoid corticosteroid use. Next IV reclast  will be due in April 2025.  Due to update DEXA in July 2025.   Vitamin D  deficiency: She is taking vitamin D  5000 units twice weekly.  Status post total shoulder replacement, right: Doing well.  Good range of motion with no discomfort currently.  Primary osteoarthritis of both hands: PIP and DIP thickening consistent with osteoarthritis of both hands.  No synovitis noted.  Primary osteoarthritis of both feet - Under care of Dr. Mindy injections every 3 months.  She is not experiencing any increased pain or inflammation in her feet at this time.  No tenderness or synovitis of MTP joints.  History of breast cancer -10/02/21 suspicious calcifications right breast-Bilateral mastectomy, right SLNBx, Dr Margarito. 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 PTispN0. ER 60%, PR negative.  Right breast with DCIS, all margins free of tumor, adequate margins and 0/5 lymph nodes positive for carcinoma. Left breast with no malignancy.  Recurrent seroma.   Completed implant and reconstruction with  Dr. Merita.  Planning nipple tattoos in the future.   No upcoming surgical procedures  scheduled.   Pes cavus of both feet: Wearing proper fitting shoes.  Other medical conditions are listed as follows:  Anxiety and depression  Hyperparathyroidism, primary (HCC)  History of gastroesophageal reflux (GERD)  Essential hypertension: Blood pressure was 131/87 today in the office.  Hashimoto's thyroiditis  History of miscarriage  History of hyperlipidemia  Hx of migraines  Former smoker  Prediabetes  Orders: No orders of the defined types were placed in this encounter.  No orders of the defined types were placed in this encounter.    Follow-Up Instructions: Return in about 5 months (around 08/16/2023) for Rheumatoid arthritis, Osteoarthritis, Osteoporosis.   Waddell CHRISTELLA Craze, PA-C  Note - This record has been created using Dragon software.  Chart creation errors have been sought, but may not always  have been located. Such creation errors do not reflect on  the standard of medical care.

## 2023-03-10 ENCOUNTER — Other Ambulatory Visit (HOSPITAL_COMMUNITY): Payer: Self-pay

## 2023-03-12 ENCOUNTER — Telehealth: Payer: Self-pay | Admitting: Pharmacist

## 2023-03-12 NOTE — Telephone Encounter (Signed)
 Submitted a Prior Authorization RENEWAL request to [facility] for RINVOQ via CoverMyMeds. Will update once we receive a response.  Key: [id number]

## 2023-03-13 NOTE — Telephone Encounter (Signed)
 Received notification from [facility] regarding a prior authorization for RINVOQ . Authorization has been APPROVED from 03/12/2023 to 03/09/2024 (? - assuming). Approval letter sent to scan center.  Patient can continue to fill through [facility]: [phone]   Authorization # [id number]  Sherry Pennant, PharmD, MPH, BCPS, CPP Clinical Pharmacist (Rheumatology and Pulmonology)

## 2023-03-16 ENCOUNTER — Encounter: Payer: Self-pay | Admitting: Pharmacist

## 2023-03-16 ENCOUNTER — Telehealth: Payer: Self-pay | Admitting: Pharmacist

## 2023-03-16 ENCOUNTER — Other Ambulatory Visit: Payer: Self-pay

## 2023-03-16 ENCOUNTER — Telehealth: Payer: Self-pay | Admitting: Family Medicine

## 2023-03-16 ENCOUNTER — Other Ambulatory Visit: Payer: Self-pay | Admitting: Physician Assistant

## 2023-03-16 ENCOUNTER — Ambulatory Visit (INDEPENDENT_AMBULATORY_CARE_PROVIDER_SITE_OTHER): Payer: BLUE CROSS/BLUE SHIELD | Admitting: Pharmacist

## 2023-03-16 ENCOUNTER — Ambulatory Visit: Payer: BLUE CROSS/BLUE SHIELD | Admitting: Podiatry

## 2023-03-16 ENCOUNTER — Other Ambulatory Visit (HOSPITAL_COMMUNITY): Payer: Self-pay

## 2023-03-16 ENCOUNTER — Encounter (HOSPITAL_COMMUNITY): Payer: Self-pay

## 2023-03-16 DIAGNOSIS — R739 Hyperglycemia, unspecified: Secondary | ICD-10-CM

## 2023-03-16 DIAGNOSIS — E782 Mixed hyperlipidemia: Secondary | ICD-10-CM

## 2023-03-16 DIAGNOSIS — I10 Essential (primary) hypertension: Secondary | ICD-10-CM

## 2023-03-16 MED ORDER — RINVOQ 15 MG PO TB24
15.0000 mg | ORAL_TABLET | Freq: Every day | ORAL | 2 refills | Status: DC
  Filled 2023-03-16: qty 30, 30d supply, fill #0
  Filled 2023-04-10: qty 30, 30d supply, fill #1
  Filled 2023-05-08: qty 30, 30d supply, fill #2

## 2023-03-16 NOTE — Telephone Encounter (Signed)
 Patient is having respiratory symptoms - cough, congestions, laryngitis. Started 03/07/2023.  No fever.  Started with sore throat but has resolved.  COVID test was negative 03/13/2023.   Patient has appointment in [facility] [place] tomorrow morning 1/7 at 8am. Can she have a video visit later tomorrow morning?

## 2023-03-16 NOTE — Telephone Encounter (Signed)
 Called and spoke with patient. She is coming in tomorrow to see Dr. Carmelia Roller at 2:30pm

## 2023-03-16 NOTE — Telephone Encounter (Signed)
 Last Fill: 12/16/2022  Labs: 02/10/2023 CO2 33, Alk. Phos. 38  TB Gold: 09/04/2022 Neg    Next Visit: 03/18/2023  Last Visit: 12/09/2022  IK:Myzlfjunpi arthritis of multiple sites with negative rheumatoid factor   Current Dose per office note 12/09/2022: Rinvoq  15 mg 1 tablet by mouth daily.   Okay to refill Rinvoq ?

## 2023-03-16 NOTE — Progress Notes (Signed)
 03/16/2023 Name: Sandra [name] MRN: [id number] DOB: 1960-11-07  Chief Complaint  Patient presents with   Diabetes   Hyperlipidemia   Hypertension    Sandra [name] is a 63 y.o. year old female she was referred to the pharmacist by their PCP for assistance in managing medication access.   Spoke with Sandra [name] by phone today.  Subjective: Patient mentions today that she has been having symptoms of a respiratory infection for the last 1 to 2 weeks - cough, congestions and laryngitis. She initially had a sore throat but this has resolved. COVID test at home was negative 03/13/2023. Denies fever.   Type 2 DM  and obesity - Diagnosed 10/2022 after her last labs showed an A1c of 6.6%.   Patient has rheumatoid arthritis and reports she has steroid injection about every 3 months in feet or hip. She also will sometimes need systemic /oral prednisone  for burst therapy when RA is not well controlled..   Starting weight = 206lbs; Current weight = 185lbs  Has lost about 21 lbs (start date 11/01/2022) Starting BMI = 34.4; Current BMI = 30.8 Height = 5' 5  Wt Readings from Last 3 Encounters:  02/10/23 185 lb (83.9 kg)  12/09/22 189 lb 9.6 oz (86 kg)  10/06/22 206 lb (93.4 kg)    Current therapy: Mounjaro  7.5mg  weekly (hasn't started 10mg  dose yet) Initially started Mounjaro  2.5mg  11/01/2022. Increased to 5mg  weekly 11/29/2022 and 7.5mg  started 01/17/2023.   Previous medications tried:  metformin - caused diarrhea.   She is using One Touch Verio to check her blood glucose daily.  Recent home blood glucose range 96 to 125. Blood glucose usually 100 to 120's.  Appetitive lowest that day of and a few days after each dose.   Diet: Following a keto type diet. 1500 calories or less per day.  Usually eats 2 meals per day - 11am and 6pm. If she snacks between meals is usually low sugar yogurt or a few blueberries.  Lunch is tuna salad or green salad Dinner - meat + veggies;  occasionally will have a small portion of low carb pasta or rice.    Hyperlipidemia:  Current therapy: atorvastatin  80mg  daily, ezetimibe  10mg  daily and fenofibrate  134mg  daily  LDL is not at goal but have improved 147 >> 131 >> 117 (02/10/2023). It was recommended she consider referral to lipid clinic but patient declines.   Hypertension:  Current therapy: irbesartan  150mg  daily and furosemide  20mg  daily   BP Readings from Last 3 Encounters:  02/10/23 106/70  12/09/22 (!) 146/99  10/06/22 124/80     Current Pharmacy:  [id number] [place], [place] - [address] [facility] [address] [facility] [address] [place] [address] Phone: [phone] Fax: [phone]    Objective:  Lab Results  Component Value Date   HGBA1C 5.9 02/10/2023    Lab Results  Component Value Date   CREATININE 0.82 02/10/2023   BUN 15 02/10/2023   NA 140 02/10/2023   K 4.9 02/10/2023   CL 100 02/10/2023   CO2 33 (H) 02/10/2023    Lab Results  Component Value Date   CHOL 210 (H) 02/10/2023   HDL 73.30 02/10/2023   LDLCALC 117 (H) 02/10/2023   LDLDIRECT 179.0 09/03/2021   TRIG 102.0 02/10/2023   CHOLHDL 3 02/10/2023    Medications Reviewed Today     Reviewed by Carla Milling, RPH-CPP (Pharmacist) on 03/16/23 at 1018  Med List Status: <None>  Medication Order Taking? Sig Documenting Provider Last Dose Status Informant  ALPRAZolam  (XANAX ) 0.25 MG tablet [id number] Yes Take 1 tablet (0.25 mg total) by mouth daily as needed. Domenica Harlene LABOR, MD Taking Active   aspirin 325 MG tablet [id number] Yes Take 325 mg by mouth daily. [provider] Taking Active   atorvastatin  (LIPITOR) 80 MG tablet [id number] Yes TAKE 1 TABLET BY MOUTH EVERY DAY Domenica Harlene LABOR, MD Taking Active   Blood Glucose Monitoring Suppl (ONETOUCH VERIO FLEX SYSTEM) w/Device KIT [id number] Yes Use to check blood glucose 1 to 2 times per day Domenica Harlene LABOR, MD Taking Active    Cholecalciferol (VITAMIN D3) 125 MCG (5000 UT) CAPS [id number] Yes Take by mouth 2 (two) times a week. [provider] Taking Active   diclofenac (VOLTAREN) 75 MG EC tablet [id number] Yes  [provider] Taking Active   escitalopram  (LEXAPRO ) 10 MG tablet [id number] Yes Take 1 tablet (10 mg total) by mouth daily. Domenica Harlene LABOR, MD Taking Active   ezetimibe  (ZETIA ) 10 MG tablet [id number] Yes TAKE 1 TABLET BY MOUTH EVERY DAY Domenica Harlene LABOR, MD Taking Active   fenofibrate  micronized (LOFIBRA) 134 MG capsule [id number] Yes Take 1 capsule (134 mg total) by mouth daily before breakfast. Domenica Harlene LABOR, MD Taking Active   furosemide  (LASIX ) 20 MG tablet [id number] Yes TAKE 1 TABLET BY MOUTH EVERY DAY Domenica Harlene LABOR, MD Taking Active   glucose blood (ONETOUCH VERIO) test strip [id number] Yes Use to check blood glucose 1 to 2 times per day Domenica Harlene LABOR, MD Taking Active   irbesartan  (AVAPRO ) 150 MG tablet [id number] Yes Take 1 tablet (150 mg total) by mouth daily. Domenica Harlene LABOR, MD Taking Active   lactobacillus acidophilus (BACID) TABS tablet [id number] Yes Take 2 tablets by mouth 3 (three) times daily. [provider] Taking Active   Lancet Device MISC [id number] Yes PRN as needed. Domenica Harlene LABOR, MD Taking Active   Lancets Misc. MISC [id number] Yes PRN as needed. Domenica Harlene LABOR, MD Taking Active   leflunomide  (ARAVA ) 20 MG tablet [id number] Yes TAKE 1 TABLET BY MOUTH EVERY DAY Cheryl Waddell HERO, PA-C Taking Active   levothyroxine  (SYNTHROID ) 75 MCG tablet [id number] Yes Take 1 tablet (75 mcg total) by mouth daily before breakfast. Domenica Harlene LABOR, MD Taking Active   methocarbamol  (ROBAXIN ) 500 MG tablet [id number] Yes Take 1 tablet (500 mg total) by mouth 2 (two) times daily. Franklyn Sid SAILOR, MD Taking Active   metoprolol  succinate (TOPROL -XL) 25 MG 24 hr tablet [id number] Yes Take 1 tablet (25 mg total) by mouth daily. Take with or immediately following a meal Domenica Harlene LABOR, MD  Taking Active   omeprazole  (PRILOSEC) 40 MG capsule [id number] Yes TAKE 1 CAPSULE BY MOUTH EVERY DAY Domenica Harlene LABOR, MD Taking Active   OneTouch [place]ica Lancets 33G [place] [id number] Yes Use to check blood glucose 1 to 2 times per day Domenica Harlene LABOR, MD Taking Active   predniSONE  (DELTASONE ) 5 MG tablet [id number]  Take 4 tablets by mouth daily x2 days, 3 tablets daily x2 days, 2 tablets daily x2 days, 1 tablet daily x2 days prn Domenica Harlene LABOR, MD  Active   tirzepatide  (MOUNJARO ) 10 MG/0.5ML Pen [id number] No Inject 10 mg into the skin once a week.  Patient not taking: Reported on 03/16/2023   Domenica Harlene LABOR, MD Not Taking Active   Trospium Chloride 60 MG CP24 [id number] Yes trospium ER 60 mg capsule,extended release 24 hr  TAKE  1 CAPSULE BY MOUTH EVERY DAY [provider] Taking Active   TURMERIC PO [id number] Yes Take 1 tablet by mouth 2 (two) times daily. [provider] Taking Active   Upadacitinib  ER (RINVOQ ) 15 MG TB24 [id number] Yes Take 1 tablet (15 mg total) by mouth daily. Cheryl Waddell HERO, PA-C Taking Active   verapamil  (CALAN -SR) 120 MG CR tablet [id number] Yes TAKE 1 TABLET BY MOUTH EVERYDAY AT BEDTIME Domenica Harlene LABOR, MD Taking Active               Assessment/Plan:   Diabetes / obesity: Tolerating Mounjaro  well. Has lost 21 lbs and A1c and home blood glucose has improved.  - Patient will finish her current prescription of Mounjaro  7.5mg  (should complete around 03/17/2023) She will then increase Mounjaro  to 10mg  weekly. [facility] - Rx on file at pharmacy but is too soon to refill today. Per [facility], it does not appear that patient will needed a prior authorization for Mounjaro  10mg  dose.  - Continue with current diet changes.  - Reviewed home blood glucose readings and reviewed goals. Continue to check blood glucose at home 1 to 2 times per day. Fasting blood glucose goal (before meals) = 80 to 130 Blood glucose goal after a meal =  less than 180   Hypertension - last blood pressure was at goal  - Continue to check blood pressure at home. Call office if blood pressure is > 140/90 on a regular basis.  - Continue current blood pressure medications.  Hyperlipidemia: Not at LDL goal; TG is now at goal  - Continue atorvastatin  80mg  daily, ezetimibe  10mg  daily and fenofibrate  134mg  daily.  - Due to recheck lipids in March 2025. If LDL still > 100, consider adding either Nexletol / nexlizet or PCSK9 agent.    Follow up in 2-3 months to assess blood glucose and consider Mounjaro  titration  Madelin Ray, PharmD Clinical Pharmacist [facility]

## 2023-03-16 NOTE — Telephone Encounter (Signed)
 Patient would like a call back for a sooner appt

## 2023-03-16 NOTE — Progress Notes (Signed)
 Specialty Pharmacy Refill Coordination Note  Sandra [name] is a 63 y.o. female contacted today regarding refills of specialty medication(s) No data recorded  Patient requested (Patient-Rptd) Delivery   Delivery date: (Patient-Rptd) 03/20/23   Verified address: (Patient-Rptd) [address] [place], [place] [address]   Medication will be filled on 03/19/23. [employer]  Pending refill request. Patient has been notified via MyChart regarding refill needed.

## 2023-03-17 ENCOUNTER — Encounter: Payer: Self-pay | Admitting: Family Medicine

## 2023-03-17 ENCOUNTER — Other Ambulatory Visit: Payer: Self-pay | Admitting: Family Medicine

## 2023-03-17 ENCOUNTER — Ambulatory Visit: Payer: BLUE CROSS/BLUE SHIELD | Admitting: Family Medicine

## 2023-03-17 VITALS — BP 124/72 | HR 103 | Temp 98.0°F | Resp 16 | Ht 65.0 in | Wt 179.6 lb

## 2023-03-17 DIAGNOSIS — J189 Pneumonia, unspecified organism: Secondary | ICD-10-CM | POA: Diagnosis not present

## 2023-03-17 DIAGNOSIS — Z09 Encounter for follow-up examination after completed treatment for conditions other than malignant neoplasm: Secondary | ICD-10-CM | POA: Diagnosis not present

## 2023-03-17 HISTORY — DX: Pneumonia, unspecified organism: J18.9

## 2023-03-17 MED ORDER — AZITHROMYCIN 250 MG PO TABS: ORAL_TABLET | ORAL | 0 refills | Status: DC

## 2023-03-17 NOTE — Telephone Encounter (Signed)
 Patient has appointment with Dr. Carmelia Roller today (03/17/2023) at 2:30 pm

## 2023-03-17 NOTE — Patient Instructions (Signed)
 Continue to push fluids, practice good hand hygiene, and cover your mouth if you cough. ? ?If you start having fevers, shaking or shortness of breath, seek immediate care. ? ?OK to take Tylenol 1000 mg (2 extra strength tabs) or 975 mg (3 regular strength tabs) every 6 hours as needed. ? ?Let us know if you need anything. ?

## 2023-03-17 NOTE — Progress Notes (Signed)
 Chief Complaint  Patient presents with   URI    Cough and congestion 2 weeks    Sandra [name] here for URI complaints.  Duration: 2 weeks  Associated symptoms: sinus congestion, rhinorrhea, shortness of breath, and dry cough Denies: sinus pain, itchy watery eyes, ear pain, ear drainage, sore throat, wheezing, myalgia, and N/V Treatment to date: Nyquil, Dayquil, Advil Sick contacts: No  Past Medical History:  Diagnosis Date   Allergic rhinitis 05/31/2012   Breast cancer (HCC)    right breast, dx 10/2021.   Chest pain    PT SAW CARDIOLOGIST DR. WONDA - STRESS ECHO DONE NEGATIVE - NO PROBLEM SINCE   Chicken pox as achild   Cough 11/29/2012   RESOLVED - IT WAS CAUSED BY LISINOPRIL   COVID-19    11/2022   Elevated LFTs 05/31/2012   Eustachian tube dysfunction 07/27/2017   GERD (gastroesophageal reflux disease)    H/O tobacco use, presenting hazards to health 11/29/2012   Smoked 1 1/2 ppd for 20 years quit in roughtly 2007   Headache 11/25/2015   Hematuria 04/09/2014   Hoarseness 02/04/2012   RESOLVED - THOUGHT TO HAVE BEEN ALLERY RELATED   Hyperglycemia 04/06/2014   Hyperlipidemia    Hyperparathyroidism (HCC)    Hypertension    Obesity, unspecified 05/31/2012   Ocular migraine    Overactive bladder 05/31/2012   Preventative health care 02/04/2012   PTSD (post-traumatic stress disorder)    Rapid heart beat    PT FEELS PROB RELATED TO HYPER PARATHYROIDISM   Rheumatoid arthritis (HCC)    SUI (stress urinary incontinence, female) 02/04/2012   Sees Dr Scottie and they have discussed a bladder tack but so far she declines    Objective BP 124/72   Pulse (!) 103   Temp 98 F (36.7 C) (Oral)   Resp 16   Ht 5' 5 (1.651 m)   Wt 179 lb 9.6 oz (81.5 kg)   LMP 04/01/2011   SpO2 95%   BMI 29.89 kg/m  General: Awake, alert, appears stated age HEENT: AT, [place], ears patent b/l and TM's neg, nares patent w/o discharge, pharynx pink and without exudates, MMM, no sinus  TTP Neck: No masses or asymmetry Heart: Tachycardic, regular rhythm Lungs: CTAB, no accessory muscle use Psych: Age appropriate judgment and insight, normal mood and affect  Walking pneumonia - Plan: azithromycin  (ZITHROMAX ) 250 MG tablet  She will continue supportive care for the next couple days.  If no improvement, will take a Z-Pak.  Continue to push fluids, practice good hand hygiene, cover mouth when coughing. F/u prn. If starting to experience fevers, shaking, or shortness of breath, seek immediate care. Pt voiced understanding and agreement to the plan.  Sandra [name] [place], DO 03/17/23 3:31 PM

## 2023-03-18 ENCOUNTER — Other Ambulatory Visit (HOSPITAL_COMMUNITY): Payer: Self-pay

## 2023-03-18 ENCOUNTER — Telehealth: Payer: Self-pay | Admitting: Pharmacist

## 2023-03-18 ENCOUNTER — Encounter: Payer: Self-pay | Admitting: Physician Assistant

## 2023-03-18 ENCOUNTER — Ambulatory Visit: Payer: BLUE CROSS/BLUE SHIELD | Attending: Physician Assistant | Admitting: Physician Assistant

## 2023-03-18 VITALS — BP 131/87 | HR 91 | Resp 14 | Ht 65.0 in | Wt 180.0 lb

## 2023-03-18 DIAGNOSIS — Z8759 Personal history of other complications of pregnancy, childbirth and the puerperium: Secondary | ICD-10-CM

## 2023-03-18 DIAGNOSIS — Z8719 Personal history of other diseases of the digestive system: Secondary | ICD-10-CM

## 2023-03-18 DIAGNOSIS — Z87891 Personal history of nicotine dependence: Secondary | ICD-10-CM

## 2023-03-18 DIAGNOSIS — R7303 Prediabetes: Secondary | ICD-10-CM

## 2023-03-18 DIAGNOSIS — E559 Vitamin D deficiency, unspecified: Secondary | ICD-10-CM | POA: Diagnosis not present

## 2023-03-18 DIAGNOSIS — M810 Age-related osteoporosis without current pathological fracture: Secondary | ICD-10-CM

## 2023-03-18 DIAGNOSIS — M0609 Rheumatoid arthritis without rheumatoid factor, multiple sites: Secondary | ICD-10-CM

## 2023-03-18 DIAGNOSIS — Z96611 Presence of right artificial shoulder joint: Secondary | ICD-10-CM

## 2023-03-18 DIAGNOSIS — F419 Anxiety disorder, unspecified: Secondary | ICD-10-CM

## 2023-03-18 DIAGNOSIS — M19041 Primary osteoarthritis, right hand: Secondary | ICD-10-CM

## 2023-03-18 DIAGNOSIS — Z853 Personal history of malignant neoplasm of breast: Secondary | ICD-10-CM

## 2023-03-18 DIAGNOSIS — Q6671 Congenital pes cavus, right foot: Secondary | ICD-10-CM

## 2023-03-18 DIAGNOSIS — Z79899 Other long term (current) drug therapy: Secondary | ICD-10-CM

## 2023-03-18 DIAGNOSIS — Q6672 Congenital pes cavus, left foot: Secondary | ICD-10-CM

## 2023-03-18 DIAGNOSIS — M19042 Primary osteoarthritis, left hand: Secondary | ICD-10-CM

## 2023-03-18 DIAGNOSIS — Z8669 Personal history of other diseases of the nervous system and sense organs: Secondary | ICD-10-CM

## 2023-03-18 DIAGNOSIS — M19072 Primary osteoarthritis, left ankle and foot: Secondary | ICD-10-CM

## 2023-03-18 DIAGNOSIS — E063 Autoimmune thyroiditis: Secondary | ICD-10-CM

## 2023-03-18 DIAGNOSIS — E210 Primary hyperparathyroidism: Secondary | ICD-10-CM

## 2023-03-18 DIAGNOSIS — Z8639 Personal history of other endocrine, nutritional and metabolic disease: Secondary | ICD-10-CM

## 2023-03-18 DIAGNOSIS — F32A Depression, unspecified: Secondary | ICD-10-CM

## 2023-03-18 DIAGNOSIS — M19071 Primary osteoarthritis, right ankle and foot: Secondary | ICD-10-CM

## 2023-03-18 DIAGNOSIS — I10 Essential (primary) hypertension: Secondary | ICD-10-CM

## 2023-03-18 NOTE — Patient Instructions (Signed)
 Standing Labs We placed an order today for your standing lab work.   Please have your standing labs drawn in March and every 3 months   Please have your labs drawn 2 weeks prior to your appointment so that the provider can discuss your lab results at your appointment, if possible.  Please note that you may see your imaging and lab results in MyChart before we have reviewed them. We will contact you once all results are reviewed. Please allow our office up to 72 hours to thoroughly review all of the results before contacting the office for clarification of your results.  WALK-IN LAB HOURS  Monday through Thursday from 8:00 am -12:30 pm and 1:00 pm-5:00 pm and Friday from 8:00 am-12:00 pm.  Patients with office visits requiring labs will be seen before walk-in labs.  You may encounter longer than normal wait times. Please allow additional time. Wait times may be shorter on  Monday and Thursday afternoons.  We do not book appointments for walk-in labs. We appreciate your patience and understanding with our staff.   Labs are drawn by [facility]. Please bring your co-pay at the time of your lab draw.  You may receive a bill from [facility] for your lab work.  Please note if you are on Hydroxychloroquine and and an order has been placed for a Hydroxychloroquine level,  you will need to have it drawn 4 hours or more after your last dose.  If you wish to have your labs drawn at another location, please call the office 24 hours in advance so we can fax the orders.  The office is located at [address], [address], [place], [place] [address]   If you have any questions regarding directions or hours of operation,  please call [phone].   As a reminder, please drink plenty of water prior to coming for your lab work. Thanks!

## 2023-03-18 NOTE — Telephone Encounter (Signed)
 Received copay card information for a BMS medication via [employer] however patient is taking Rinvoq  which is not a BMS-manufactured medication. Unclear what medication this is for? Scanning to media tab for retention  Sherry Pennant, PharmD, MPH, BCPS, CPP Clinical Pharmacist (Rheumatology and Pulmonology)

## 2023-03-19 ENCOUNTER — Encounter: Payer: Self-pay | Admitting: Physician Assistant

## 2023-03-19 ENCOUNTER — Other Ambulatory Visit: Payer: Self-pay

## 2023-03-19 ENCOUNTER — Other Ambulatory Visit (HOSPITAL_COMMUNITY): Payer: Self-pay

## 2023-03-23 NOTE — Telephone Encounter (Signed)
 Approval letter received from [facility] for Rinvoq. PA is approved from 03/12/23 to 03/11/24  Ref # [id number]  Chesley Mires, PharmD, MPH, BCPS, CPP Clinical Pharmacist (Rheumatology and Pulmonology)

## 2023-03-24 ENCOUNTER — Other Ambulatory Visit: Payer: Self-pay | Admitting: Family Medicine

## 2023-03-25 ENCOUNTER — Ambulatory Visit: Payer: BLUE CROSS/BLUE SHIELD | Admitting: Podiatry

## 2023-03-25 DIAGNOSIS — G4733 Obstructive sleep apnea (adult) (pediatric): Secondary | ICD-10-CM | POA: Diagnosis not present

## 2023-03-27 ENCOUNTER — Other Ambulatory Visit: Payer: Self-pay

## 2023-03-30 DIAGNOSIS — S62306A Unspecified fracture of fifth metacarpal bone, right hand, initial encounter for closed fracture: Secondary | ICD-10-CM | POA: Diagnosis not present

## 2023-03-31 ENCOUNTER — Telehealth: Payer: Self-pay | Admitting: *Deleted

## 2023-03-31 MED ORDER — PREDNISONE 5 MG PO TABS: ORAL_TABLET | ORAL | 0 refills | Status: DC

## 2023-03-31 NOTE — Telephone Encounter (Signed)
Ok to send prednisone 20 mg tapering by 5 mg every 4 days. Take prednisone in the morning with food and avoid the use of NSAIDs.

## 2023-03-31 NOTE — Addendum Note (Signed)
Addended by: Ellen Henri on: 03/31/2023 05:07 PM   Modules accepted: Orders

## 2023-03-31 NOTE — Telephone Encounter (Signed)
[facility] would [facility] [address] [place], [place] [address] Phone: [phone]

## 2023-03-31 NOTE — Telephone Encounter (Signed)
Patient contacted the office and left message stating she is out of town on vacation in [place]. Patient states she is having a terrible flare. Patient states she is having trouble walking and her hands and feet are painful. Patient is requesting a prescription for Prednisone. Please advise.

## 2023-03-31 NOTE — Telephone Encounter (Signed)
Rx sent to the pharmacy, verbal order confirmed with Sherron Ales, PA-C. Patient is aware that prescription has been sent.

## 2023-04-06 ENCOUNTER — Other Ambulatory Visit: Payer: Self-pay | Admitting: Family Medicine

## 2023-04-06 DIAGNOSIS — S62326A Displaced fracture of shaft of fifth metacarpal bone, right hand, initial encounter for closed fracture: Secondary | ICD-10-CM | POA: Diagnosis not present

## 2023-04-06 DIAGNOSIS — M79641 Pain in right hand: Secondary | ICD-10-CM | POA: Diagnosis not present

## 2023-04-09 LAB — HM DIABETES EYE EXAM

## 2023-04-10 ENCOUNTER — Other Ambulatory Visit: Payer: Self-pay

## 2023-04-10 NOTE — Progress Notes (Signed)
Specialty Pharmacy Refill Coordination Note  Sandra [name] is a 63 y.o. female contacted today regarding refills of specialty medication(s) Upadacitinib (Rinvoq)   Patient requested Delivery   Delivery date: 04/21/23   Verified address: [address], [place], [place] [address]   Medication will be filled on 04/20/23.

## 2023-04-10 NOTE — Progress Notes (Signed)
Specialty Pharmacy Ongoing Clinical Assessment Note  Sandra [name] is a 63 y.o. female who is being followed by the specialty pharmacy service for RxSp Rheumatoid Arthritis   Patient's specialty medication(s) reviewed today: Upadacitinib (Rinvoq)   Missed doses in the last 4 weeks: 0   Patient/Caregiver did not have any additional questions or concerns.   Therapeutic benefit summary: Patient is achieving benefit   Adverse events/side effects summary: No adverse events/side effects   Patient's therapy is appropriate to: Continue    Goals Addressed             This Visit's Progress    Minimize recurrence of flares       Patient is on track. Patient will maintain adherence         Follow up:  6 months  Bobette Mo Specialty Pharmacist

## 2023-04-13 DIAGNOSIS — I159 Secondary hypertension, unspecified: Secondary | ICD-10-CM | POA: Diagnosis not present

## 2023-04-13 DIAGNOSIS — M79641 Pain in right hand: Secondary | ICD-10-CM | POA: Diagnosis not present

## 2023-04-13 DIAGNOSIS — S62326D Displaced fracture of shaft of fifth metacarpal bone, right hand, subsequent encounter for fracture with routine healing: Secondary | ICD-10-CM | POA: Diagnosis not present

## 2023-04-14 DIAGNOSIS — D485 Neoplasm of uncertain behavior of skin: Secondary | ICD-10-CM | POA: Diagnosis not present

## 2023-04-14 DIAGNOSIS — L538 Other specified erythematous conditions: Secondary | ICD-10-CM | POA: Diagnosis not present

## 2023-04-14 DIAGNOSIS — L570 Actinic keratosis: Secondary | ICD-10-CM | POA: Diagnosis not present

## 2023-04-14 DIAGNOSIS — L814 Other melanin hyperpigmentation: Secondary | ICD-10-CM | POA: Diagnosis not present

## 2023-04-14 DIAGNOSIS — R238 Other skin changes: Secondary | ICD-10-CM | POA: Diagnosis not present

## 2023-04-14 DIAGNOSIS — D2362 Other benign neoplasm of skin of left upper limb, including shoulder: Secondary | ICD-10-CM | POA: Diagnosis not present

## 2023-04-14 DIAGNOSIS — Z789 Other specified health status: Secondary | ICD-10-CM | POA: Diagnosis not present

## 2023-04-14 DIAGNOSIS — X32XXXA Exposure to sunlight, initial encounter: Secondary | ICD-10-CM | POA: Diagnosis not present

## 2023-04-14 DIAGNOSIS — D2372 Other benign neoplasm of skin of left lower limb, including hip: Secondary | ICD-10-CM | POA: Diagnosis not present

## 2023-04-14 DIAGNOSIS — L821 Other seborrheic keratosis: Secondary | ICD-10-CM | POA: Diagnosis not present

## 2023-04-20 ENCOUNTER — Other Ambulatory Visit: Payer: Self-pay

## 2023-04-24 ENCOUNTER — Encounter: Payer: Self-pay | Admitting: Family Medicine

## 2023-04-24 ENCOUNTER — Telehealth: Payer: Self-pay | Admitting: *Deleted

## 2023-04-24 NOTE — Telephone Encounter (Signed)
Please clarify if she will require surgical intervention?   Reclast is due in April 2025.  Unable to use forteo/tylmos due to history of breast cancer/radiation.  Plan to avoid evenity due to concurrent use of rinvoq given the increased risk for MACE.   Is she taking a calcium and vitamin D supplement?

## 2023-04-24 NOTE — Telephone Encounter (Signed)
Patient contacted the office to advise she had a fall on 03/29/2023. Patient states she fractured the fifth metacarpal on her right hand. Patient is being followed by Dr. Oralia Rud at [facility] in [place].

## 2023-04-24 NOTE — Telephone Encounter (Signed)
Returned call to patient. She states she did not require surgical intervention. Patient states she is currently in a cast. Patient has a follow up with Ortho. Patient states she is not on Calcium due to her Calcium being elevated. Patient states she take Vitamin D 3 daily.

## 2023-04-25 DIAGNOSIS — G4733 Obstructive sleep apnea (adult) (pediatric): Secondary | ICD-10-CM | POA: Diagnosis not present

## 2023-04-27 DIAGNOSIS — S62326D Displaced fracture of shaft of fifth metacarpal bone, right hand, subsequent encounter for fracture with routine healing: Secondary | ICD-10-CM | POA: Diagnosis not present

## 2023-04-27 DIAGNOSIS — M79641 Pain in right hand: Secondary | ICD-10-CM | POA: Diagnosis not present

## 2023-04-29 ENCOUNTER — Ambulatory Visit: Payer: BLUE CROSS/BLUE SHIELD | Admitting: Podiatry

## 2023-04-29 DIAGNOSIS — M1711 Unilateral primary osteoarthritis, right knee: Secondary | ICD-10-CM | POA: Diagnosis not present

## 2023-04-29 DIAGNOSIS — M25551 Pain in right hip: Secondary | ICD-10-CM | POA: Diagnosis not present

## 2023-04-29 DIAGNOSIS — M25561 Pain in right knee: Secondary | ICD-10-CM | POA: Diagnosis not present

## 2023-04-29 DIAGNOSIS — M8448XD Pathological fracture, other site, subsequent encounter for fracture with routine healing: Secondary | ICD-10-CM | POA: Diagnosis not present

## 2023-05-06 ENCOUNTER — Other Ambulatory Visit: Payer: Self-pay | Admitting: Family Medicine

## 2023-05-07 DIAGNOSIS — M5459 Other low back pain: Secondary | ICD-10-CM | POA: Diagnosis not present

## 2023-05-07 DIAGNOSIS — M533 Sacrococcygeal disorders, not elsewhere classified: Secondary | ICD-10-CM | POA: Diagnosis not present

## 2023-05-08 ENCOUNTER — Other Ambulatory Visit: Payer: Self-pay | Admitting: Pharmacy Technician

## 2023-05-08 ENCOUNTER — Other Ambulatory Visit: Payer: Self-pay

## 2023-05-08 NOTE — Progress Notes (Signed)
 Specialty Pharmacy Refill Coordination Note  Sandra [name] is a 63 y.o. female contacted today regarding refills of specialty medication(s) Upadacitinib (Rinvoq)   Patient requested Delivery   Delivery date: 05/15/23   Verified address: [address] [place]   Medication will be filled on 05/14/23.

## 2023-05-12 ENCOUNTER — Other Ambulatory Visit: Payer: Self-pay | Admitting: *Deleted

## 2023-05-12 ENCOUNTER — Other Ambulatory Visit (INDEPENDENT_AMBULATORY_CARE_PROVIDER_SITE_OTHER): Payer: BLUE CROSS/BLUE SHIELD

## 2023-05-12 DIAGNOSIS — E785 Hyperlipidemia, unspecified: Secondary | ICD-10-CM

## 2023-05-12 DIAGNOSIS — Z79899 Other long term (current) drug therapy: Secondary | ICD-10-CM

## 2023-05-12 DIAGNOSIS — Z111 Encounter for screening for respiratory tuberculosis: Secondary | ICD-10-CM

## 2023-05-12 DIAGNOSIS — I10 Essential (primary) hypertension: Secondary | ICD-10-CM

## 2023-05-12 DIAGNOSIS — E1169 Type 2 diabetes mellitus with other specified complication: Secondary | ICD-10-CM

## 2023-05-12 LAB — CBC WITH DIFFERENTIAL/PLATELET
Basophils Absolute: 0 10*3/uL (ref 0.0–0.1)
Basophils Relative: 0.7 % (ref 0.0–3.0)
Eosinophils Absolute: 0 10*3/uL (ref 0.0–0.7)
Eosinophils Relative: 1.1 % (ref 0.0–5.0)
HCT: 37.3 % (ref 36.0–46.0)
Hemoglobin: 12.6 g/dL (ref 12.0–15.0)
Lymphocytes Relative: 37.4 % (ref 12.0–46.0)
Lymphs Abs: 1.5 10*3/uL (ref 0.7–4.0)
MCHC: 33.6 g/dL (ref 30.0–36.0)
MCV: 90.2 fl (ref 78.0–100.0)
Monocytes Absolute: 0.5 10*3/uL (ref 0.1–1.0)
Monocytes Relative: 13.4 % — ABNORMAL HIGH (ref 3.0–12.0)
Neutro Abs: 1.9 10*3/uL (ref 1.4–7.7)
Neutrophils Relative %: 47.4 % (ref 43.0–77.0)
Platelets: 342 10*3/uL (ref 150.0–400.0)
RBC: 4.14 Mil/uL (ref 3.87–5.11)
RDW: 14.5 % (ref 11.5–15.5)
WBC: 4 10*3/uL (ref 4.0–10.5)

## 2023-05-12 LAB — COMPREHENSIVE METABOLIC PANEL
ALT: 36 U/L — ABNORMAL HIGH (ref 0–35)
AST: 34 U/L (ref 0–37)
Albumin: 4.4 g/dL (ref 3.5–5.2)
Alkaline Phosphatase: 39 U/L (ref 39–117)
BUN: 16 mg/dL (ref 6–23)
CO2: 27 meq/L (ref 19–32)
Calcium: 9.9 mg/dL (ref 8.4–10.5)
Chloride: 103 meq/L (ref 96–112)
Creatinine, Ser: 0.78 mg/dL (ref 0.40–1.20)
GFR: 82.04 mL/min (ref >60.00)
Glucose, Bld: 95 mg/dL (ref 70–99)
Potassium: 4.5 meq/L (ref 3.5–5.1)
Sodium: 138 meq/L (ref 135–145)
Total Bilirubin: 0.4 mg/dL (ref 0.2–1.2)
Total Protein: 6.8 g/dL (ref 6.0–8.3)

## 2023-05-12 LAB — TSH: TSH: 1.92 u[IU]/mL (ref 0.35–5.50)

## 2023-05-12 LAB — LIPID PANEL
Cholesterol: 193 mg/dL (ref 0–200)
HDL: 63.9 mg/dL (ref >39.00)
LDL Cholesterol: 101 mg/dL — ABNORMAL HIGH (ref 0–99)
NonHDL: 128.66
Total CHOL/HDL Ratio: 3
Triglycerides: 137 mg/dL (ref 0.0–149.0)
VLDL: 27.4 mg/dL (ref 0.0–40.0)

## 2023-05-12 LAB — HEMOGLOBIN A1C: Hgb A1c MFr Bld: 6 % (ref 4.6–6.5)

## 2023-05-12 NOTE — Addendum Note (Signed)
 Addended by: Mervin Kung A on: 05/12/2023 11:26 AM   Modules accepted: Orders

## 2023-05-13 ENCOUNTER — Encounter: Payer: Self-pay | Admitting: Family Medicine

## 2023-05-13 ENCOUNTER — Ambulatory Visit (INDEPENDENT_AMBULATORY_CARE_PROVIDER_SITE_OTHER): Payer: BLUE CROSS/BLUE SHIELD | Admitting: Pharmacist

## 2023-05-13 VITALS — Ht 65.0 in | Wt 170.0 lb

## 2023-05-13 DIAGNOSIS — E669 Obesity, unspecified: Secondary | ICD-10-CM

## 2023-05-13 DIAGNOSIS — S62326D Displaced fracture of shaft of fifth metacarpal bone, right hand, subsequent encounter for fracture with routine healing: Secondary | ICD-10-CM | POA: Diagnosis not present

## 2023-05-13 DIAGNOSIS — I10 Essential (primary) hypertension: Secondary | ICD-10-CM

## 2023-05-13 DIAGNOSIS — M79641 Pain in right hand: Secondary | ICD-10-CM | POA: Diagnosis not present

## 2023-05-13 DIAGNOSIS — E782 Mixed hyperlipidemia: Secondary | ICD-10-CM

## 2023-05-13 NOTE — Progress Notes (Signed)
 05/13/2023 Name: Sandra [name] MRN: [id number] DOB: 27-Nov-1960  No chief complaint on file.   Sandra [name] is a 63 y.o. year old female she was referred to the pharmacist by their PCP for assistance in managing medication access.   Spoke with Sandra [name] by phone today.  Subjective:   Type 2 DM  and obesity - Diagnosed 10/2022 after her last labs showed an A1c of 6.0%.   Patient has rheumatoid arthritis and reports she has steroid injection about every 3 months in feet or hip. She also will sometimes need systemic /oral prednisone for burst therapy when RA is not well controlled..   Starting weight = 206lbs; Current weight = 170 lbs (home weight)  Has lost about 30 lbs (start date 11/01/2022) Starting BMI = 34.4; Current BMI = 28.29 Height = 5\' 5"   Wt Readings from Last 3 Encounters:  03/18/23 180 lb (81.6 kg)  03/17/23 179 lb 9.6 oz (81.5 kg)  02/10/23 185 lb (83.9 kg)     Current therapy: Mounjaro 10mg  weekly Initially started Mounjaro 2.5mg  11/01/2022.    Previous medications tried:  metformin - caused diarrhea.   She is using One Touch Verio to check her blood glucose daily.  Recent home blood glucose range 90 to 110's  Diet: Following a keto type diet. 1500 calories or less per day.  Usually eats 2 meals per day - 11am and 6pm. If she snacks between meals is usually low sugar yogurt or a few blueberries.  Lunch is tuna salad or green salad Dinner - meat + veggies; occasionally will have a small portion of low carb pasta or rice.    Hyperlipidemia:  Current therapy: atorvastatin 80mg  daily, ezetimibe 10mg  daily and fenofibrate 134mg  daily  LDL is not at goal but had improved 147 >> 131 >> 117 >> 101. It was recommended she consider referral to lipid clinic but patient declines.   Hypertension:  Current therapy: irbesartan 150mg  daily and furosemide 20mg  daily   BP Readings from Last 3 Encounters:  03/18/23 131/87  03/17/23 124/72  02/10/23  106/70     Current Pharmacy:  [id number] [place], [place] - [address] [facility] [address] [place] [address] Phone: [phone] Fax: [phone]    Objective:  Lab Results  Component Value Date   HGBA1C 6.0 05/12/2023    Lab Results  Component Value Date   CREATININE 0.78 05/12/2023   BUN 16 05/12/2023   NA 138 05/12/2023   K 4.5 05/12/2023   CL 103 05/12/2023   CO2 27 05/12/2023    Lab Results  Component Value Date   CHOL 193 05/12/2023   HDL 63.90 05/12/2023   LDLCALC 101 (H) 05/12/2023   LDLDIRECT 179.0 09/03/2021   TRIG 137.0 05/12/2023   CHOLHDL 3 05/12/2023    Medications Reviewed Today   Medications were not reviewed in this encounter       Assessment/Plan:   Diabetes / obesity: Tolerating Mounjaro well. Has lost 30 lbs and A1c and home blood glucose has improved.  - Continue Mounjaro to 10mg  weekly.   - Continue with current diet changes.  - Reviewed home blood glucose readings and reviewed goals. Continue to check blood glucose at home 1 to 2 times per day. Fasting blood glucose goal (before meals) = 80 to 130 Blood glucose goal after a meal = less than 180   Hypertension - last blood pressure was at goal  - Continue  to check blood pressure at home. Call office if blood pressure is > 140/90 on a regular basis.  - Continue current blood pressure medications.  Hyperlipidemia: Not at LDL goal; TG is now at goal  - Continue atorvastatin 80mg  daily, ezetimibe 10mg  daily and fenofibrate 134mg  daily.  -  If LDL still > 100 with next lipid check, coudl consider adding either Nexletol / nexlizet or PCSK9 agent.    Follow up in 2-3 months to assess blood glucose and consider Mounjaro titration if needed. Also order labs if needed prior to PCP appointment 09/2023  Henrene Pastor, PharmD Clinical Pharmacist [facility]

## 2023-05-14 ENCOUNTER — Other Ambulatory Visit: Payer: Self-pay

## 2023-05-14 DIAGNOSIS — M5459 Other low back pain: Secondary | ICD-10-CM | POA: Diagnosis not present

## 2023-05-15 DIAGNOSIS — M5459 Other low back pain: Secondary | ICD-10-CM | POA: Diagnosis not present

## 2023-05-18 ENCOUNTER — Other Ambulatory Visit: Payer: Self-pay

## 2023-05-18 DIAGNOSIS — M5459 Other low back pain: Secondary | ICD-10-CM | POA: Diagnosis not present

## 2023-05-20 ENCOUNTER — Ambulatory Visit (INDEPENDENT_AMBULATORY_CARE_PROVIDER_SITE_OTHER)

## 2023-05-20 ENCOUNTER — Encounter: Payer: Self-pay | Admitting: Podiatry

## 2023-05-20 ENCOUNTER — Ambulatory Visit: Payer: BLUE CROSS/BLUE SHIELD | Admitting: Podiatry

## 2023-05-20 ENCOUNTER — Other Ambulatory Visit: Payer: Self-pay

## 2023-05-20 DIAGNOSIS — M19071 Primary osteoarthritis, right ankle and foot: Secondary | ICD-10-CM | POA: Diagnosis not present

## 2023-05-20 DIAGNOSIS — M19072 Primary osteoarthritis, left ankle and foot: Secondary | ICD-10-CM | POA: Diagnosis not present

## 2023-05-20 MED ORDER — BETAMETHASONE SOD PHOS & ACET 6 (3-3) MG/ML IJ SUSP
3.0000 mg | Freq: Once | INTRAMUSCULAR | Status: AC
  Administered 2023-05-20: 3 mg via INTRA_ARTICULAR

## 2023-05-20 NOTE — Progress Notes (Signed)
 Chief Complaint  Patient presents with   Injections    Patient states that she is here for her injections for her arthritis    Subjective:  63 year old female PMHx rheumatoid arthritis presenting today for follow-up management of chronic bilateral midtarsal pain and arthritis.  She is being seen by rheumatology.  Patient states that the cortisone injections do help for a few months.  She also does not go barefoot and wears good supportive tennis shoes and sneakers  Past Medical History:  Diagnosis Date   Allergic rhinitis 05/31/2012   Breast cancer (HCC)    right breast, dx 10/2021.   Chest pain    PT SAW CARDIOLOGIST DR. Excell Seltzer - STRESS ECHO DONE NEGATIVE - NO PROBLEM SINCE   Chicken pox as achild   Cough 11/29/2012   RESOLVED - IT WAS CAUSED BY LISINOPRIL   COVID-19    11/2022   Elevated LFTs 05/31/2012   Eustachian tube dysfunction 07/27/2017   GERD (gastroesophageal reflux disease)    H/O tobacco use, presenting hazards to health 11/29/2012   Smoked 1 1/2 ppd for 20 years quit in roughtly 2007   Headache 11/25/2015   Hematuria 04/09/2014   Hoarseness 02/04/2012   RESOLVED - THOUGHT TO HAVE BEEN ALLERY RELATED   Hyperglycemia 04/06/2014   Hyperlipidemia    Hyperparathyroidism (HCC)    Hypertension    Obesity, unspecified 05/31/2012   Ocular migraine    Overactive bladder 05/31/2012   Preventative health care 02/04/2012   PTSD (post-traumatic stress disorder)    Rapid heart beat    PT FEELS PROB RELATED TO HYPER PARATHYROIDISM   Rheumatoid arthritis (HCC)    SUI (stress urinary incontinence, female) 02/04/2012   Sees Dr Newt Minion and they have discussed a bladder tack but so far she declines   Walking pneumonia 03/17/2023   Past Surgical History:  Procedure Laterality Date   BREAST EXCISIONAL BIOPSY Left    Age 71 Fibroadenoma   BREAST SURGERY     left breast excision of adenoma   btl     COLONOSCOPY  07/2020   FOOT SURGERY     MASTECTOMY Bilateral  11/28/2021   PARATHYROIDECTOMY Right 08/12/2013   Procedure: right inferior frozen section PARATHYROIDECTOMY;  Surgeon: Velora Heckler, MD;  Location: [facility];  Service: General;  Laterality: Right;   PELVIC LAPAROSCOPY  03/2004   PLACEMENT OF BREAST IMPLANTS  01/15/2023   History of breast cancer   TISSUE EXPANDER PLACEMENT Bilateral 08/14/2022   at [facility] REPLACEMENT Right 01/2019   TUBAL LIGATION     WISDOM TOOTH EXTRACTION  30 yrs ago   Allergies  Allergen Reactions   Cefdinir Other (See Comments) and Nausea And Vomiting    Severe Stomach cramps and diarrhea for several days.   Doxycycline Diarrhea   Codeine Nausea And Vomiting   Penicillins     RASH, ITCHING   Simvastatin     myalgias   Other Rash   Objective: Physical Exam General: The patient is alert and oriented x3 in no acute distress.  Dermatology: Skin is cool, dry and supple bilateral lower extremities. Negative for open lesions or macerations.  Vascular: Palpable pedal pulses bilaterally.  Capillary refill WNL.  Clinically no concern for vascular compromise  Neurological: Grossly intact via light touch  Musculoskeletal Exam: Chronic pain on palpation throughout the midtarsal joint bilateral  Radiographic exam B/L feet 04/03/2021 Normal osseous mineralization.  There is some degenerative changes noted throughout the lesser TMT joints consistent  with the patient's given history of rheumatoid and osteoarthritis.  No acute fractures identified.  Assessment: 1. s/p excision of benign skin lesion left plantar foot. DOS: 06/13/2021 2. Chronic bilateral midfoot capsulitis/arthritis 3.  History of rheumatoid arthritis; managed by rheumatology  Plan of Care:  -Patient was evaluated.   -Prescription for Medrol Dosepak -Injection of 0.5 cc Celestone Soluspan injection of the bilateral midtarsal joint -Continue close management with rheumatology -Continue custom molded orthotics -Return to clinic as  needed, or about 3 months for follow-up care and for follow-up x-rays  Felecia Shelling, DPM [facility]  Dr. Felecia Shelling, DPM    [address] [place], [place] [address]                Office [phone]  Fax [phone]

## 2023-05-21 ENCOUNTER — Telehealth: Payer: Self-pay | Admitting: *Deleted

## 2023-05-21 DIAGNOSIS — M5459 Other low back pain: Secondary | ICD-10-CM | POA: Diagnosis not present

## 2023-05-21 NOTE — Telephone Encounter (Signed)
 Attempted to contact the patient and left message for patient to call the office.

## 2023-05-21 NOTE — Telephone Encounter (Signed)
 Patient returned call to the office. Patient advised that she will need to hold Rinvoq and arava at least 1 week prior to surgical intervention and then she will require clearance by the surgeons prior to resuming.

## 2023-05-21 NOTE — Telephone Encounter (Signed)
 Patient states she will be having right knee replacement. Patient states she thinks this will ne scheduled for May or June. Patient states she is also going to have to have surgery for revision for breast cancer. Patient states she is going to be applying for disability and wanted you to be aware.

## 2023-05-21 NOTE — Telephone Encounter (Signed)
 Please make patient aware that she will need to hold Rinvoq and arava at least 1 week prior to surgical intervention and then she will require clearance by the surgeons prior to resuming.

## 2023-05-25 DIAGNOSIS — M5459 Other low back pain: Secondary | ICD-10-CM | POA: Diagnosis not present

## 2023-05-29 DIAGNOSIS — M5459 Other low back pain: Secondary | ICD-10-CM | POA: Diagnosis not present

## 2023-06-02 ENCOUNTER — Encounter: Payer: Self-pay | Admitting: Family Medicine

## 2023-06-02 DIAGNOSIS — M5459 Other low back pain: Secondary | ICD-10-CM | POA: Diagnosis not present

## 2023-06-03 ENCOUNTER — Other Ambulatory Visit: Payer: Self-pay

## 2023-06-03 ENCOUNTER — Other Ambulatory Visit (HOSPITAL_COMMUNITY): Payer: Self-pay

## 2023-06-04 ENCOUNTER — Other Ambulatory Visit: Payer: Self-pay

## 2023-06-04 ENCOUNTER — Other Ambulatory Visit (HOSPITAL_COMMUNITY): Payer: Self-pay

## 2023-06-04 ENCOUNTER — Telehealth: Payer: Self-pay | Admitting: Pharmacy Technician

## 2023-06-04 ENCOUNTER — Other Ambulatory Visit: Payer: Self-pay | Admitting: Pharmacist

## 2023-06-04 ENCOUNTER — Other Ambulatory Visit: Payer: Self-pay | Admitting: Pharmacy Technician

## 2023-06-04 ENCOUNTER — Other Ambulatory Visit: Payer: Self-pay | Admitting: Physician Assistant

## 2023-06-04 DIAGNOSIS — Z111 Encounter for screening for respiratory tuberculosis: Secondary | ICD-10-CM

## 2023-06-04 DIAGNOSIS — Z79899 Other long term (current) drug therapy: Secondary | ICD-10-CM

## 2023-06-04 DIAGNOSIS — M5459 Other low back pain: Secondary | ICD-10-CM | POA: Diagnosis not present

## 2023-06-04 DIAGNOSIS — Z9225 Personal history of immunosupression therapy: Secondary | ICD-10-CM

## 2023-06-04 MED ORDER — RINVOQ 15 MG PO TB24
15.0000 mg | ORAL_TABLET | Freq: Every day | ORAL | 2 refills | Status: DC
  Filled 2023-06-04: qty 30, 30d supply, fill #0
  Filled 2023-06-30: qty 30, 30d supply, fill #1
  Filled 2023-10-26: qty 30, 30d supply, fill #2

## 2023-06-04 NOTE — Progress Notes (Signed)
 Therapy plan placed for Reclast IV [phone])  for [facility] to start benefits investigation  Diagnosis: age-related osteoporosis  Provider: Dr.Shaili Corliss Skains  Dose: 5mg  IV every 12 months   Last Clinic Visit: 03/18/2023 Next Clinic Visit: 08/19/23 [facility] on 05/12/2023  Orders placed for Reclast IV x 1 dose along with premedication of acetaminophen and diphenhydramine to be administered 30 minutes before medication infusion.  Chesley Mires, PharmD, MPH, BCPS, CPP Clinical Pharmacist (Rheumatology and Pulmonology)

## 2023-06-04 NOTE — Telephone Encounter (Signed)
 Auth Submission: NO AUTH NEEDED Site of care: Site of care: [facility] Payer: BCBS [place] Medication & CPT/J Code(s) submitted: Reclast (Zolendronic acid) [id number] Route of submission (phone, fax, portal):  Phone # Fax # Auth type: Buy/Bill PB Units/visits requested: 1 DOSE Reference number:  Approval from: 06/04/23 to 03/09/24

## 2023-06-04 NOTE — Progress Notes (Signed)
 Specialty Pharmacy Refill Coordination Note  CORIN FORMISANO is a 63 y.o. female contacted today regarding refills of specialty medication(s) Upadacitinib (Rinvoq)   Patient requested (Patient-Rptd) Delivery   Delivery date: (Patient-Rptd) 06/19/23   Verified address: (Patient-Rptd) [address], [place], [place] [address]   Medication will be filled on 06/18/2023.   This fill date is pending response to refill request from provider. Patient was left a voice mail and if they have not received fill by intended date they must follow up with pharmacy.

## 2023-06-04 NOTE — Telephone Encounter (Signed)
 Last Fill: 03/16/2023  Labs: 05/12/2023 ALT 36, Monocytes Relative 13.4  TB Gold: 09/04/2022 Neg   Next Visit: 08/19/2023  Last Visit: 03/18/2023  [id number] arthritis of multiple sites with negative rheumatoid factor   Current Dose per office note 03/18/2023: Rinvoq 15 mg 1 tablet by mouth daily   Okay to refill Rinvoq?

## 2023-06-06 ENCOUNTER — Other Ambulatory Visit: Payer: Self-pay | Admitting: Family Medicine

## 2023-06-07 ENCOUNTER — Other Ambulatory Visit: Payer: Self-pay | Admitting: Physician Assistant

## 2023-06-08 NOTE — Telephone Encounter (Signed)
 Last Fill: 03/05/2023  Labs: 05/12/2023 ALT 36, Monocytes Relative 13.4  Next Visit: 08/19/2023  Last Visit: 03/18/2023  DX: Rheumatoid arthritis of multiple sites with negative rheumatoid factor   Current Dose per office note 03/18/2023:  Arava 20 mg daily.   Okay to refill Arava ?

## 2023-06-09 DIAGNOSIS — G4733 Obstructive sleep apnea (adult) (pediatric): Secondary | ICD-10-CM | POA: Diagnosis not present

## 2023-06-11 DIAGNOSIS — M5459 Other low back pain: Secondary | ICD-10-CM | POA: Diagnosis not present

## 2023-06-12 DIAGNOSIS — M5459 Other low back pain: Secondary | ICD-10-CM | POA: Diagnosis not present

## 2023-06-15 ENCOUNTER — Ambulatory Visit (INDEPENDENT_AMBULATORY_CARE_PROVIDER_SITE_OTHER)

## 2023-06-15 VITALS — BP 151/95 | HR 76 | Temp 98.5°F | Resp 16 | Ht 65.0 in | Wt 167.8 lb

## 2023-06-15 DIAGNOSIS — M816 Localized osteoporosis [Lequesne]: Secondary | ICD-10-CM

## 2023-06-15 MED ORDER — SODIUM CHLORIDE 0.9 % IV SOLN: INTRAVENOUS | Status: DC

## 2023-06-15 MED ORDER — ZOLEDRONIC ACID 5 MG/100ML IV SOLN
5.0000 mg | Freq: Once | INTRAVENOUS | Status: AC
  Administered 2023-06-15: 5 mg via INTRAVENOUS
  Filled 2023-06-15: qty 100

## 2023-06-15 MED ORDER — DIPHENHYDRAMINE HCL 25 MG PO CAPS
25.0000 mg | ORAL_CAPSULE | Freq: Once | ORAL | Status: AC
  Administered 2023-06-15: 25 mg via ORAL
  Filled 2023-06-15: qty 1

## 2023-06-15 MED ORDER — ACETAMINOPHEN 325 MG PO TABS
650.0000 mg | ORAL_TABLET | Freq: Once | ORAL | Status: AC
  Administered 2023-06-15: 650 mg via ORAL
  Filled 2023-06-15: qty 2

## 2023-06-15 NOTE — Progress Notes (Signed)
 Diagnosis: Osteoporosis  Provider:  Chilton Greathouse MD  Procedure: IV Infusion  IV Type: Peripheral, IV Location: R Antecubital  Reclast (Zolendronic Acid), Dose: 5 mg  Infusion Start Time: 0854  Infusion Stop Time: 0926  Post Infusion IV Care: Observation period completed and Peripheral IV Discontinued  Discharge: Condition: Good, Destination: Home . AVS Provided  Performed by:  Wyvonne Lenz, RN

## 2023-06-16 DIAGNOSIS — M5459 Other low back pain: Secondary | ICD-10-CM | POA: Diagnosis not present

## 2023-06-18 ENCOUNTER — Other Ambulatory Visit: Payer: Self-pay

## 2023-06-18 DIAGNOSIS — M533 Sacrococcygeal disorders, not elsewhere classified: Secondary | ICD-10-CM | POA: Diagnosis not present

## 2023-06-19 DIAGNOSIS — M5459 Other low back pain: Secondary | ICD-10-CM | POA: Diagnosis not present

## 2023-06-23 DIAGNOSIS — M5459 Other low back pain: Secondary | ICD-10-CM | POA: Diagnosis not present

## 2023-06-24 DIAGNOSIS — M1711 Unilateral primary osteoarthritis, right knee: Secondary | ICD-10-CM | POA: Diagnosis not present

## 2023-06-25 DIAGNOSIS — M5459 Other low back pain: Secondary | ICD-10-CM | POA: Diagnosis not present

## 2023-06-26 DIAGNOSIS — M1711 Unilateral primary osteoarthritis, right knee: Secondary | ICD-10-CM | POA: Diagnosis not present

## 2023-06-30 ENCOUNTER — Other Ambulatory Visit: Payer: Self-pay

## 2023-06-30 DIAGNOSIS — M5459 Other low back pain: Secondary | ICD-10-CM | POA: Diagnosis not present

## 2023-06-30 NOTE — Progress Notes (Signed)
 Specialty Pharmacy Refill Coordination Note  Sandra [name] is a 63 y.o. female contacted today regarding refills of specialty medication(s) Upadacitinib  (Rinvoq )   Patient requested (Patient-Rptd) Delivery   Delivery date: (Patient-Rptd) 07/10/23   Verified address: (Patient-Rptd) [address] [place], [place] [address]   Medication will be filled on 07/13/23.    Patient was contacted that the requested delivery date is too soon per insurance. New delivery date is 07/14/23 .

## 2023-07-02 DIAGNOSIS — M5459 Other low back pain: Secondary | ICD-10-CM | POA: Diagnosis not present

## 2023-07-07 ENCOUNTER — Other Ambulatory Visit: Payer: Self-pay | Admitting: Family Medicine

## 2023-07-07 DIAGNOSIS — Z421 Encounter for breast reconstruction following mastectomy: Secondary | ICD-10-CM | POA: Diagnosis not present

## 2023-07-09 ENCOUNTER — Telehealth: Payer: Self-pay | Admitting: *Deleted

## 2023-07-09 ENCOUNTER — Other Ambulatory Visit: Payer: Self-pay | Admitting: Family Medicine

## 2023-07-09 DIAGNOSIS — G4733 Obstructive sleep apnea (adult) (pediatric): Secondary | ICD-10-CM | POA: Diagnosis not present

## 2023-07-09 NOTE — Telephone Encounter (Signed)
 Patient contacted the office and states she is having a total knee replacement. Patient states she is having this done on 08/04/2023 with Dr. Jerre Moots with [facility]. Patient states this will be done at [facility] [address]. Patient is on Rinvoq  and Arava . Patient advised she should hold both medications 1 week prior to surgery. Patient also moved her appointment with our office up prior to the surgery.

## 2023-07-19 NOTE — Progress Notes (Signed)
 Office Visit Note  Patient: Sandra [name]             Date of Birth: 1960-11-23           MRN: [id number]             PCP: Neda Balk, MD Referring: Neda Balk, MD Visit Date: 07/30/2023 Occupation: @GUAROCC @  Subjective:  Pain in multiple joints  History of Present Illness: Sandra [name] is a 63 y.o. female with history of rheumatoid arthritis and osteoarthritis.   She is prescribed Rinvoq  15 mg daily and Arava  20 mg daily but has been holding both medications in anticipation of upcoming right total knee replacement scheduled on 08/04/2023.  She is aware that she will require clearance by the orthopedist prior to resuming both medications.  Patient continues to have significant discomfort in her right knee as well as her lower back.  According to the patient she currently has a pinched nerve at L4-L5 and was recently given an IM Toradol  injection as well as a Medrol  Dosepak.  Patient has been having to use a cane to assist with ambulation due to severity of discomfort.  Patient states that after her knee replacement she will be having home therapy. Patient is currently in the process of filing for disability.  Patient has been unable to work for the past 3 years due to the severity of symptoms and limited mobility.   Activities of Daily Living:  Patient reports morning stiffness for 2 hours.   Patient Reports nocturnal pain.  Difficulty dressing/grooming: Reports Difficulty climbing stairs: Reports Difficulty getting out of chair: Denies Difficulty using hands for taps, buttons, cutlery, and/or writing: Reports  Review of Systems  Constitutional:  Positive for fatigue.  HENT:  Positive for mouth dryness. Negative for mouth sores.   Eyes:  Positive for dryness.  Respiratory:  Negative for shortness of breath.   Cardiovascular:  Negative for chest pain and palpitations.  Gastrointestinal:  Negative for blood in stool, constipation and diarrhea.  Endocrine: Negative  for increased urination.  Genitourinary:  Negative for involuntary urination.  Musculoskeletal:  Positive for joint pain, gait problem, joint pain and morning stiffness. Negative for joint swelling, myalgias, muscle weakness, muscle tenderness and myalgias.  Skin:  Negative for color change, rash, hair loss and sensitivity to sunlight.  Allergic/Immunologic: Negative for susceptible to infections.  Neurological:  Negative for dizziness and headaches.  Hematological:  Negative for swollen glands.  Psychiatric/Behavioral:  Positive for sleep disturbance. Negative for depressed mood. The patient is nervous/anxious.     PMFS History:  Patient Active Problem List   Diagnosis Date Noted   Nodule of soft tissue 10/06/2022   Osteoporosis 06/04/2022   Hashimoto's thyroiditis 04/08/2022   Bronchitis 01/09/2022   H/O mastectomy, bilateral 11/29/2021   Malignant neoplasm of right breast in female, estrogen receptor positive (HCC) 10/14/2021   Insomnia 09/04/2021   Osteopenia 09/04/2021   Pulmonary nodule 03/29/2021   Rheumatoid arthritis (HCC) 12/02/2019   Anxiety and depression 08/01/2019   Arthralgia 05/04/2019   OSA (obstructive sleep apnea) 05/03/2019   Thyroiditis 12/20/2018   Muscle cramp 12/20/2018   Eustachian tube dysfunction 07/27/2017   Headache 11/25/2015   Acute bacterial sinusitis 03/26/2015   Hyperglycemia 04/06/2014   Hyperparathyroidism, primary (HCC) 07/12/2013   Hypercalcemia 12/16/2012   H/O tobacco use, presenting hazards to health 11/29/2012   Overactive bladder 05/31/2012   Elevated LFTs 05/31/2012   Obesity (BMI 30-39.9) 05/31/2012   Allergic rhinitis 05/31/2012  Preventative health care 02/04/2012   GERD (gastroesophageal reflux disease)    Rotator cuff syndrome of right shoulder 06/11/2011   Hypertension 01/03/2011   Hyperlipidemia 01/03/2011   KNEE PAIN, BILATERAL 09/28/2008   ANSERINE BURSITIS 09/28/2008   CAVUS DEFORMITY OF FOOT, ACQUIRED 09/28/2008     Past Medical History:  Diagnosis Date   Allergic rhinitis 05/31/2012   Breast cancer (HCC)    right breast, dx 10/2021.   Chest pain    PT SAW CARDIOLOGIST DR. Arlester Ladd - STRESS ECHO DONE NEGATIVE - NO PROBLEM SINCE   Chicken pox as achild   Cough 11/29/2012   RESOLVED - IT WAS CAUSED BY LISINOPRIL   COVID-19    11/2022   Elevated LFTs 05/31/2012   Eustachian tube dysfunction 07/27/2017   GERD (gastroesophageal reflux disease)    H/O tobacco use, presenting hazards to health 11/29/2012   Smoked 1 1/2 ppd for 20 years quit in roughtly 2007   Headache 11/25/2015   Hematuria 04/09/2014   Hoarseness 02/04/2012   RESOLVED - THOUGHT TO HAVE BEEN ALLERY RELATED   Hyperglycemia 04/06/2014   Hyperlipidemia    Hyperparathyroidism (HCC)    Hypertension    Obesity, unspecified 05/31/2012   Ocular migraine    Overactive bladder 05/31/2012   Pinched nerve 07/23/2023   Right hip   Preventative health care 02/04/2012   PTSD (post-traumatic stress disorder)    Rapid heart beat    PT FEELS PROB RELATED TO HYPER PARATHYROIDISM   Rheumatoid arthritis (HCC)    SUI (stress urinary incontinence, female) 02/04/2012   Sees Dr Lorry Rouge and they have discussed a bladder tack but so far she declines   Walking pneumonia 03/17/2023    Family History  Problem Relation Age of Onset   Hypertension Mother 20       alive   Renal cancer Mother        renal   Hyperlipidemia Mother    Osteoporosis Mother        humerus    Macular degeneration Mother    Tremor Mother    Parkinson's disease Mother    Hypertension Father 61       alive   Atrial fibrillation Father    Colon cancer Father        colon   Hyperlipidemia Father    Stroke Father    Alzheimer's disease Father    Hypertension Sister    Hypertension Brother 59       alive   Tremor Brother    Asthma Son    Eczema Son    Healthy Son    Heart disease Paternal Aunt    Heart disease Paternal Uncle    Lung cancer Maternal Grandfather         lung/ smoker   Emphysema Maternal Grandfather    Heart disease Paternal Grandmother    Other Paternal Grandfather        black lung   Heart disease Paternal Grandfather    Past Surgical History:  Procedure Laterality Date   BREAST EXCISIONAL BIOPSY Left    Age 26 Fibroadenoma   BREAST SURGERY     left breast excision of adenoma   btl     COLONOSCOPY  07/2020   FOOT SURGERY     MASTECTOMY Bilateral 11/28/2021   PARATHYROIDECTOMY Right 08/12/2013   Procedure: right inferior frozen section PARATHYROIDECTOMY;  Surgeon: Keitha Pata, MD;  Location: [facility];  Service: General;  Laterality: Right;   PELVIC LAPAROSCOPY  03/2004  PLACEMENT OF BREAST IMPLANTS  01/15/2023   History of breast cancer   TISSUE EXPANDER PLACEMENT Bilateral 08/14/2022   at [facility] REPLACEMENT Right 01/2019   TUBAL LIGATION     WISDOM TOOTH EXTRACTION  30 yrs ago   Social History   Social History Narrative   Not on file   Immunization History  Administered Date(s) Administered   Hepatitis A, Adult 09/04/2016, 03/27/2017   Influenza Split 12/09/2011   Influenza, Seasonal, Injecte, Preservative Fre 02/10/2023   Influenza,inj,Quad PF,6+ Mos 11/29/2012, 11/01/2013, 11/20/2015, 12/29/2017, 04/03/2019, 12/13/2019, 03/28/2021   PFIZER(Purple Top)SARS-COV-2 Vaccination 05/20/2019, 06/15/2019, 01/11/2020   Pneumococcal Conjugate-13 03/28/2021   Pneumococcal Polysaccharide-23 08/01/2019   Tdap 06/07/2015   Zoster Recombinant(Shingrix) 08/01/2019, 10/03/2020     Objective: Vital Signs: BP (!) 154/97 (BP Location: Left Arm, Patient Position: Sitting, Cuff Size: Normal)   Pulse 77   Resp 16   Ht 5\' 5"  (1.651 m)   Wt 165 lb (74.8 kg)   LMP 04/01/2011   BMI 27.46 kg/m    Physical Exam Vitals and nursing note reviewed.  Constitutional:      Appearance: She is well-developed.  HENT:     Head: Normocephalic and atraumatic.  Eyes:     Conjunctiva/sclera: Conjunctivae normal.   Cardiovascular:     Rate and Rhythm: Normal rate and regular rhythm.     Heart sounds: Normal heart sounds.  Pulmonary:     Effort: Pulmonary effort is normal.     Breath sounds: Normal breath sounds.  Abdominal:     General: Bowel sounds are normal.     Palpations: Abdomen is soft.  Musculoskeletal:     Cervical back: Normal range of motion.  Lymphadenopathy:     Cervical: No cervical adenopathy.  Skin:    General: Skin is warm and dry.     Capillary Refill: Capillary refill takes less than 2 seconds.  Neurological:     Mental Status: She is alert and oriented to person, place, and time.  Psychiatric:        Behavior: Behavior normal.      Musculoskeletal Exam: C-spine has good range of motion.  Limited mobility of the lumbar spine.  Right shoulder replacement has good range of motion.  Left shoulder joint has good range of motion.  Elbow joints, wrist joints, MCPs, PIPs, DIPs have good range of motion with no synovitis.  Complete fist formation noted bilaterally.  PIP and DIP thickening consistent with osteoarthritis of both hands.  Hip joints difficult to assess once seated position.  Painful range of motion of the right knee.  Left knee joint has full range of motion no warmth or effusion.  Ankle joints have good range of motion with no tenderness or joint swelling.  CDAI Exam: CDAI Score: -- Patient Global: --; Provider Global: -- Swollen: --; Tender: -- Joint Exam 07/30/2023   No joint exam has been documented for this visit   There is currently no information documented on the homunculus. Go to the Rheumatology activity and complete the homunculus joint exam.  Investigation: No additional findings.  Imaging: DG Foot Complete Left Result Date: 07/01/2023 Please see detailed radiograph report in office note.  DG Foot Complete Right Result Date: 07/01/2023 Please see detailed radiograph report in office note.   Recent Labs: Lab Results  Component Value Date    WBC 4.0 05/12/2023   HGB 12.6 05/12/2023   PLT 342.0 05/12/2023   NA 138 05/12/2023   K 4.5 05/12/2023  CL 103 05/12/2023   CO2 27 05/12/2023   GLUCOSE 95 05/12/2023   BUN 16 05/12/2023   CREATININE 0.78 05/12/2023   BILITOT 0.4 05/12/2023   ALKPHOS 39 05/12/2023   AST 34 05/12/2023   ALT 36 (H) 05/12/2023   PROT 6.8 05/12/2023   ALBUMIN 4.4 05/12/2023   CALCIUM  9.9 05/12/2023   GFRAA 117 08/31/2020   QFTBGOLDPLUS NEGATIVE 09/04/2022    Speciality Comments: Orencia  stoped 10/04/20 Rinvoq  started 10/11/20  Procedures:  No procedures performed Allergies: Cefdinir, Doxycycline , Codeine, Metformin and related, Penicillins, Simvastatin , and Other   Upcoming knee replacement scheduled on 08/04/23.    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  Patient is currently taking a Medrol  Dosepak prescribed by neurosurgery.  According to the patient she is having impingement at L4-L5 and is having to use a cane to assist with ambulation.  She is also having persistent discomfort in the right knee joint due to underlying osteoarthritis.  Patient is currently holding Rinvoq  and Arava  in anticipation for upcoming right knee replacement surgery scheduled on 08/04/2023.  Overall her rheumatoid arthritis has remained well-controlled on combination therapy.  She requested a refill of arava  to be sent to the pharmacy.  Patient is aware that she will require clearance to reinitiate Rinvoq  and Arava  after surgery.  She will notify us  if she develops any signs or symptoms of a rheumatoid arthritis flare.  She will follow-up in the office in 5 months or sooner if needed.  High risk medication use - Currently holding--Rinvoq  15 mg by mouth daily and Arava  20 mg daily.  Rinvoq  initially July 2022-gaps in therapy breast cancer dx/ mastectomy/recurrent seromas/reconstructive surgery.  CBC and BMP  updated on 07/20/23.  Patient requested to have updated lab work today and plans on returning for her next lab work in September 2025. TB gold negative on 09/04/22.  Order for TB gold released today. Lipid panel updated on 05/12/23.  No recent or recurrent infections.  Discussed the importance of holding rinvoq  and arava  if she develops signs or symptoms of an infection and to resume once the infection has completely cleared.  - Plan: CBC with Differential/Platelet, Comprehensive metabolic panel with GFR, QuantiFERON-TB Gold Plus  Screening for tuberculosis -Order for TB gold released today.   Plan: QuantiFERON-TB Gold Plus  Post-menopausal osteoporosis: DEXA updated on 09/09/21: The BMD measured at Forearm Radius 33% is 0.741 g/cm2 with a T-score of -1.5. Previous DEXA on 08/12/19:Radius 33% is 0.751 g/cm2 with a T-score of -1.4. Bilateral sacral alar fracture after stepping in a hole in January 2024.   History of left hip fracture-April 2024.  History of recurrent prednisone  tapers and frequent cortisone injections. History of hyperparathyroidism--history of right parathyroidectomy 2015. History of Hashimoto's thyroiditis--remains on Synthroid . Previous smoker. History of GERD--not a good candidate for oral bisphosphonate therapy.  History of breast cancer--plan avoiding Tymlos/Forteo.  Plan to avoid Evenity given risk for major cardiovascular events while on Rinvoq . IV reclast  administered 06/09/22-no adverse effects.  She is taking vitamin D  5,000 twice weekly. She is taking a daily calcium  supplement.  Discussed the importance of trying to avoid corticosteroid use. Most recent IV reclast  administered 06/15/23. Due to update DEXA in July 2025--patient plans to update in fall 2025.   Vitamin D  deficiency: She is taking vitamin D  5,000 units twice weekly.   Status post total shoulder replacement, right: Doing well.  Good range of motion.  Primary osteoarthritis of both hands: PIP and DIP  thickening consistent with osteoarthritis of both hands.  Patient has difficulty with fine motor and grip strength at times due to severity of osteoarthritic changes in both hands.  No active inflammation was noted on examination today.  Primary osteoarthritis of right knee: Scheduled for a total right knee replacement on 08/04/2023.  She is currently holding Rinvoq  and Arava  as advised.  She will require clearance by orthopedic surgeon prior to reinitiating therapy.  Patient will be having home therapy after surgery.    Primary osteoarthritis of both feet: Patient continues to experience intermittent pain and stiffness in both feet.  She has difficulty standing or walking for long periods of time.  Pes cavus of both feet: Discussed the importance of wearing proper fitting shoes.   History of breast cancer: 10/02/21 suspicious calcifications right breast-Bilateral mastectomy, right SLNBx, Dr Elam Gray. 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 PTispN0. ER 60%, PR negative.  Right breast with DCIS, all margins free of tumor, adequate margins and 0/5 lymph nodes positive for carcinoma. Left breast with no malignancy.  Recurrent seroma.   Completed implant and reconstruction with Dr. Rolene Client.  Planning nipple tattoos in the future.   No upcoming surgical procedures scheduled  Other medical conditions are listed as follows:   Anxiety and depression  Hyperparathyroidism, primary (HCC)  History of gastroesophageal reflux (GERD)  Essential hypertension: BP was elevated today in the office.    Hashimoto's thyroiditis  History of miscarriage  History of hyperlipidemia: Lipid panel updated on 05/12/23.   Hx of migraines  Former smoker  Prediabetes   Orders: Orders Placed This Encounter  Procedures   CBC with Differential/Platelet   Comprehensive metabolic panel with GFR   QuantiFERON-TB Gold Plus   No orders of the defined types were  placed in this encounter.   Follow-Up Instructions: Return in about 5 months (around 12/30/2023) for Rheumatoid arthritis, Osteoarthritis.   Romayne Clubs, PA-C  Note - This record has been created using Dragon software.  Chart creation errors have been sought, but may not always  have been located. Such creation errors do not reflect on  the standard of medical care.

## 2023-07-20 DIAGNOSIS — Z01812 Encounter for preprocedural laboratory examination: Secondary | ICD-10-CM | POA: Diagnosis not present

## 2023-07-20 DIAGNOSIS — I10 Essential (primary) hypertension: Secondary | ICD-10-CM | POA: Diagnosis not present

## 2023-07-20 DIAGNOSIS — Z01810 Encounter for preprocedural cardiovascular examination: Secondary | ICD-10-CM | POA: Diagnosis not present

## 2023-07-20 DIAGNOSIS — F419 Anxiety disorder, unspecified: Secondary | ICD-10-CM | POA: Diagnosis not present

## 2023-07-20 DIAGNOSIS — N3281 Overactive bladder: Secondary | ICD-10-CM | POA: Diagnosis not present

## 2023-07-20 DIAGNOSIS — M069 Rheumatoid arthritis, unspecified: Secondary | ICD-10-CM | POA: Diagnosis not present

## 2023-07-20 DIAGNOSIS — Z01818 Encounter for other preprocedural examination: Secondary | ICD-10-CM | POA: Diagnosis not present

## 2023-07-20 DIAGNOSIS — M1711 Unilateral primary osteoarthritis, right knee: Secondary | ICD-10-CM | POA: Diagnosis not present

## 2023-07-20 DIAGNOSIS — E119 Type 2 diabetes mellitus without complications: Secondary | ICD-10-CM | POA: Diagnosis not present

## 2023-07-20 DIAGNOSIS — E785 Hyperlipidemia, unspecified: Secondary | ICD-10-CM | POA: Diagnosis not present

## 2023-07-20 DIAGNOSIS — G43109 Migraine with aura, not intractable, without status migrainosus: Secondary | ICD-10-CM | POA: Diagnosis not present

## 2023-07-20 DIAGNOSIS — Z79899 Other long term (current) drug therapy: Secondary | ICD-10-CM | POA: Diagnosis not present

## 2023-07-20 DIAGNOSIS — G4733 Obstructive sleep apnea (adult) (pediatric): Secondary | ICD-10-CM | POA: Diagnosis not present

## 2023-07-22 ENCOUNTER — Other Ambulatory Visit: Payer: Self-pay | Admitting: Family Medicine

## 2023-07-23 DIAGNOSIS — M5416 Radiculopathy, lumbar region: Secondary | ICD-10-CM | POA: Diagnosis not present

## 2023-07-23 DIAGNOSIS — M1711 Unilateral primary osteoarthritis, right knee: Secondary | ICD-10-CM | POA: Diagnosis not present

## 2023-07-23 DIAGNOSIS — M5459 Other low back pain: Secondary | ICD-10-CM | POA: Diagnosis not present

## 2023-07-23 DIAGNOSIS — G589 Mononeuropathy, unspecified: Secondary | ICD-10-CM

## 2023-07-23 HISTORY — DX: Mononeuropathy, unspecified: G58.9

## 2023-07-30 ENCOUNTER — Encounter: Payer: Self-pay | Admitting: Physician Assistant

## 2023-07-30 ENCOUNTER — Other Ambulatory Visit: Payer: Self-pay

## 2023-07-30 ENCOUNTER — Other Ambulatory Visit (HOSPITAL_COMMUNITY): Payer: Self-pay

## 2023-07-30 ENCOUNTER — Ambulatory Visit: Attending: Physician Assistant | Admitting: Physician Assistant

## 2023-07-30 VITALS — BP 154/97 | HR 77 | Resp 16 | Ht 65.0 in | Wt 165.0 lb

## 2023-07-30 DIAGNOSIS — Z8639 Personal history of other endocrine, nutritional and metabolic disease: Secondary | ICD-10-CM

## 2023-07-30 DIAGNOSIS — Z8719 Personal history of other diseases of the digestive system: Secondary | ICD-10-CM

## 2023-07-30 DIAGNOSIS — R7303 Prediabetes: Secondary | ICD-10-CM

## 2023-07-30 DIAGNOSIS — Q6671 Congenital pes cavus, right foot: Secondary | ICD-10-CM

## 2023-07-30 DIAGNOSIS — M0609 Rheumatoid arthritis without rheumatoid factor, multiple sites: Secondary | ICD-10-CM | POA: Diagnosis not present

## 2023-07-30 DIAGNOSIS — M19041 Primary osteoarthritis, right hand: Secondary | ICD-10-CM

## 2023-07-30 DIAGNOSIS — E210 Primary hyperparathyroidism: Secondary | ICD-10-CM

## 2023-07-30 DIAGNOSIS — Z8759 Personal history of other complications of pregnancy, childbirth and the puerperium: Secondary | ICD-10-CM

## 2023-07-30 DIAGNOSIS — M19071 Primary osteoarthritis, right ankle and foot: Secondary | ICD-10-CM

## 2023-07-30 DIAGNOSIS — M19042 Primary osteoarthritis, left hand: Secondary | ICD-10-CM

## 2023-07-30 DIAGNOSIS — E559 Vitamin D deficiency, unspecified: Secondary | ICD-10-CM | POA: Diagnosis not present

## 2023-07-30 DIAGNOSIS — M810 Age-related osteoporosis without current pathological fracture: Secondary | ICD-10-CM

## 2023-07-30 DIAGNOSIS — M1711 Unilateral primary osteoarthritis, right knee: Secondary | ICD-10-CM

## 2023-07-30 DIAGNOSIS — Z87891 Personal history of nicotine dependence: Secondary | ICD-10-CM

## 2023-07-30 DIAGNOSIS — Z853 Personal history of malignant neoplasm of breast: Secondary | ICD-10-CM

## 2023-07-30 DIAGNOSIS — Z8669 Personal history of other diseases of the nervous system and sense organs: Secondary | ICD-10-CM

## 2023-07-30 DIAGNOSIS — Z111 Encounter for screening for respiratory tuberculosis: Secondary | ICD-10-CM | POA: Diagnosis not present

## 2023-07-30 DIAGNOSIS — Z79899 Other long term (current) drug therapy: Secondary | ICD-10-CM

## 2023-07-30 DIAGNOSIS — Z96611 Presence of right artificial shoulder joint: Secondary | ICD-10-CM

## 2023-07-30 DIAGNOSIS — F32A Depression, unspecified: Secondary | ICD-10-CM

## 2023-07-30 DIAGNOSIS — E063 Autoimmune thyroiditis: Secondary | ICD-10-CM

## 2023-07-30 DIAGNOSIS — M19072 Primary osteoarthritis, left ankle and foot: Secondary | ICD-10-CM

## 2023-07-30 DIAGNOSIS — F419 Anxiety disorder, unspecified: Secondary | ICD-10-CM

## 2023-07-30 DIAGNOSIS — Q6672 Congenital pes cavus, left foot: Secondary | ICD-10-CM

## 2023-07-30 DIAGNOSIS — I10 Essential (primary) hypertension: Secondary | ICD-10-CM

## 2023-07-30 MED ORDER — LEFLUNOMIDE 20 MG PO TABS: 20.0000 mg | ORAL_TABLET | Freq: Every day | ORAL | 0 refills | Status: DC

## 2023-07-30 NOTE — Telephone Encounter (Signed)
 Last Fill: 06/08/2023  Labs: 05/12/2023  Monocytes Relative 13.4, ALT 36 (updated labs today 07/30/2023 waiting on results)  Next Visit: 12/31/2023  Last Visit: 07/30/2023  DX: Rheumatoid arthritis of multiple sites with negative rheumatoid factor   Current Dose per office note 07/30/2023: Arava  20 mg daily.   Okay to refill Arava  ?

## 2023-07-30 NOTE — Patient Instructions (Signed)

## 2023-07-31 ENCOUNTER — Ambulatory Visit: Payer: Self-pay | Admitting: Physician Assistant

## 2023-07-31 NOTE — Progress Notes (Signed)
 Absolute neutrophils are elevated and absolute lymphocytes are slightly low.   Glucose is 156.  Sodium and chloride low-- could be related to hydration status.   Alk phos is low. We will continue to monitor.  AST and ALT WNL.

## 2023-08-01 ENCOUNTER — Other Ambulatory Visit: Payer: Self-pay | Admitting: Family Medicine

## 2023-08-02 LAB — CBC WITH DIFFERENTIAL/PLATELET
Absolute Lymphocytes: 775 {cells}/uL — ABNORMAL LOW (ref 850–3900)
Absolute Monocytes: 683 {cells}/uL (ref 200–950)
Basophils Absolute: 20 {cells}/uL (ref 0–200)
Basophils Relative: 0.2 %
Eosinophils Absolute: 0 {cells}/uL — ABNORMAL LOW (ref 15–500)
Eosinophils Relative: 0 %
HCT: 36.9 % (ref 35.0–45.0)
Hemoglobin: 12.1 g/dL (ref 11.7–15.5)
MCH: 30.6 pg (ref 27.0–33.0)
MCHC: 32.8 g/dL (ref 32.0–36.0)
MCV: 93.2 fL (ref 80.0–100.0)
MPV: 10 fL (ref 7.5–12.5)
Monocytes Relative: 6.7 %
Neutro Abs: 8721 {cells}/uL — ABNORMAL HIGH (ref 1500–7800)
Neutrophils Relative %: 85.5 %
Platelets: 396 10*3/uL (ref 140–400)
RBC: 3.96 10*6/uL (ref 3.80–5.10)
RDW: 13.9 % (ref 11.0–15.0)
Total Lymphocyte: 7.6 %
WBC: 10.2 10*3/uL (ref 3.8–10.8)

## 2023-08-02 LAB — COMPREHENSIVE METABOLIC PANEL WITH GFR
AG Ratio: 1.8 (calc) (ref 1.0–2.5)
ALT: 22 U/L (ref 6–29)
AST: 18 U/L (ref 10–35)
Albumin: 4.2 g/dL (ref 3.6–5.1)
Alkaline phosphatase (APISO): 28 U/L — ABNORMAL LOW (ref 37–153)
BUN/Creatinine Ratio: 36 (calc) — ABNORMAL HIGH (ref 6–22)
BUN: 27 mg/dL — ABNORMAL HIGH (ref 7–25)
CO2: 29 mmol/L (ref 20–32)
Calcium: 10 mg/dL (ref 8.6–10.4)
Chloride: 97 mmol/L — ABNORMAL LOW (ref 98–110)
Creat: 0.76 mg/dL (ref 0.50–1.05)
Globulin: 2.3 g/dL (ref 1.9–3.7)
Glucose, Bld: 156 mg/dL — ABNORMAL HIGH (ref 65–99)
Potassium: 5.2 mmol/L (ref 3.5–5.3)
Sodium: 133 mmol/L — ABNORMAL LOW (ref 135–146)
Total Bilirubin: 0.4 mg/dL (ref 0.2–1.2)
Total Protein: 6.5 g/dL (ref 6.1–8.1)
eGFR: 89 mL/min/{1.73_m2} (ref >=60)

## 2023-08-02 LAB — QUANTIFERON-TB GOLD PLUS
Mitogen-NIL: 4.24 [IU]/mL
NIL: 0.01 [IU]/mL
QuantiFERON-TB Gold Plus: NEGATIVE
TB1-NIL: 0 [IU]/mL
TB2-NIL: 0 [IU]/mL

## 2023-08-04 ENCOUNTER — Other Ambulatory Visit: Payer: Self-pay

## 2023-08-04 DIAGNOSIS — Z7985 Long-term (current) use of injectable non-insulin antidiabetic drugs: Secondary | ICD-10-CM | POA: Diagnosis not present

## 2023-08-04 DIAGNOSIS — G4733 Obstructive sleep apnea (adult) (pediatric): Secondary | ICD-10-CM | POA: Diagnosis not present

## 2023-08-04 DIAGNOSIS — G8918 Other acute postprocedural pain: Secondary | ICD-10-CM | POA: Diagnosis not present

## 2023-08-04 DIAGNOSIS — Z471 Aftercare following joint replacement surgery: Secondary | ICD-10-CM | POA: Diagnosis not present

## 2023-08-04 DIAGNOSIS — Z791 Long term (current) use of non-steroidal anti-inflammatories (NSAID): Secondary | ICD-10-CM | POA: Diagnosis not present

## 2023-08-04 DIAGNOSIS — I10 Essential (primary) hypertension: Secondary | ICD-10-CM | POA: Diagnosis not present

## 2023-08-04 DIAGNOSIS — Z96651 Presence of right artificial knee joint: Secondary | ICD-10-CM | POA: Diagnosis not present

## 2023-08-04 DIAGNOSIS — M069 Rheumatoid arthritis, unspecified: Secondary | ICD-10-CM | POA: Diagnosis not present

## 2023-08-04 DIAGNOSIS — E039 Hypothyroidism, unspecified: Secondary | ICD-10-CM | POA: Diagnosis not present

## 2023-08-04 DIAGNOSIS — M1711 Unilateral primary osteoarthritis, right knee: Secondary | ICD-10-CM | POA: Diagnosis not present

## 2023-08-04 DIAGNOSIS — Z79899 Other long term (current) drug therapy: Secondary | ICD-10-CM | POA: Diagnosis not present

## 2023-08-04 DIAGNOSIS — Z7982 Long term (current) use of aspirin: Secondary | ICD-10-CM | POA: Diagnosis not present

## 2023-08-04 DIAGNOSIS — Z87891 Personal history of nicotine dependence: Secondary | ICD-10-CM | POA: Diagnosis not present

## 2023-08-04 DIAGNOSIS — Z79890 Hormone replacement therapy (postmenopausal): Secondary | ICD-10-CM | POA: Diagnosis not present

## 2023-08-04 DIAGNOSIS — K219 Gastro-esophageal reflux disease without esophagitis: Secondary | ICD-10-CM | POA: Diagnosis not present

## 2023-08-04 DIAGNOSIS — M810 Age-related osteoporosis without current pathological fracture: Secondary | ICD-10-CM | POA: Diagnosis not present

## 2023-08-04 DIAGNOSIS — E119 Type 2 diabetes mellitus without complications: Secondary | ICD-10-CM | POA: Diagnosis not present

## 2023-08-04 HISTORY — PX: REPLACEMENT TOTAL KNEE: SUR1224

## 2023-08-04 NOTE — Progress Notes (Signed)
 TB gold negative

## 2023-08-05 ENCOUNTER — Telehealth: Payer: Self-pay | Admitting: *Deleted

## 2023-08-05 NOTE — Telephone Encounter (Signed)
 Patient contacted the office stating she had her knee replacement yesterday. Patient states she was advised by the surgeon she may resume her medication on 08/18/2023. Patient advised she would need to wait until she has a follow up visit and has been cleared from infection and is healing well. Patient expressed understanding.

## 2023-08-06 DIAGNOSIS — M1711 Unilateral primary osteoarthritis, right knee: Secondary | ICD-10-CM | POA: Diagnosis not present

## 2023-08-09 DIAGNOSIS — G4733 Obstructive sleep apnea (adult) (pediatric): Secondary | ICD-10-CM | POA: Diagnosis not present

## 2023-08-10 DIAGNOSIS — M1711 Unilateral primary osteoarthritis, right knee: Secondary | ICD-10-CM | POA: Diagnosis not present

## 2023-08-13 DIAGNOSIS — M1711 Unilateral primary osteoarthritis, right knee: Secondary | ICD-10-CM | POA: Diagnosis not present

## 2023-08-18 DIAGNOSIS — M1711 Unilateral primary osteoarthritis, right knee: Secondary | ICD-10-CM | POA: Diagnosis not present

## 2023-08-19 ENCOUNTER — Ambulatory Visit: Payer: BLUE CROSS/BLUE SHIELD | Admitting: Physician Assistant

## 2023-08-20 DIAGNOSIS — M1711 Unilateral primary osteoarthritis, right knee: Secondary | ICD-10-CM | POA: Diagnosis not present

## 2023-08-26 ENCOUNTER — Ambulatory Visit (INDEPENDENT_AMBULATORY_CARE_PROVIDER_SITE_OTHER): Admitting: Pharmacist

## 2023-08-26 VITALS — Ht 65.0 in | Wt 167.0 lb

## 2023-08-26 DIAGNOSIS — E782 Mixed hyperlipidemia: Secondary | ICD-10-CM

## 2023-08-26 DIAGNOSIS — R739 Hyperglycemia, unspecified: Secondary | ICD-10-CM

## 2023-08-26 DIAGNOSIS — E669 Obesity, unspecified: Secondary | ICD-10-CM

## 2023-08-26 DIAGNOSIS — I10 Essential (primary) hypertension: Secondary | ICD-10-CM

## 2023-08-26 MED ORDER — TIRZEPATIDE 12.5 MG/0.5ML ~~LOC~~ SOAJ: 12.5000 mg | SUBCUTANEOUS | 1 refills | Status: DC

## 2023-08-26 NOTE — Progress Notes (Signed)
 08/26/2023 Name: Sandra [name] MRN: [id number] DOB: 04-21-1960  Chief Complaint  Patient presents with   Hyperlipidemia   Medication Management    Sandra [name] is a 63 y.o. year old female she was referred to the pharmacist by their PCP for assistance in managing medication access.   Spoke with [name] by phone today.  Subjective:   Type 2 DM  and obesity  Diagnosed with type 2 DM 10/2022.  Current therapy: Mounjaro  10mg  weekly Initially started Mounjaro  2.5mg  11/01/2022.   Previous medications tried:  metformin - caused diarrhea.   Patient has rheumatoid arthritis and reports she has steroid injection about every 3 months in feet or hip. She also will sometimes need systemic /oral prednisone  for burst therapy when RA is not well controlled..   Starting weight = 206lbs; Current weight = 167 lbs (home weight)  - she had been down around 157lbs but weight increased when she took steroids recently for pinched nerve.  Has lost about 39 lbs (Mounjaro  start date 11/01/2022) Starting BMI = 34.4; Current BMI = 27.79 Height = 5' 5  Wt Readings from Last 3 Encounters:  08/26/23 167 lb (75.8 kg)  07/30/23 165 lb (74.8 kg)  06/15/23 167 lb 12.8 oz (76.1 kg)   A1c checked at [facility] for preop - A1c was 5.4% on 07/20/2023  She is using One Touch Verio to check her blood glucose daily.  Recent home blood glucose range 90 to 120  Diet: Continues to follow keto type diet. 1500 calories or less per day.  Usually eats 2 meals per day - 11am and 6pm. If she snacks between meals is usually low sugar yogurt or a few blueberries.  Lunch is tuna salad or green salad; cottage cheese Dinner - meat + veggies; occasionally will have a small portion of low carb pasta or rice.    Hyperlipidemia:  Current therapy: atorvastatin  80mg  daily, ezetimibe  10mg  daily and fenofibrate  134mg  daily  LDL is not at goal but had improved 147 >> 131 >> 117 >> 101. It was recommended she  consider referral to lipid clinic but patient declined/    Hypertension:  Current therapy: irbesartan  150mg  daily and furosemide  20mg  daily   She is not checking blood pressure at home.  BP Readings from Last 3 Encounters:  07/30/23 (!) 154/97  06/15/23 (!) 151/95  03/18/23 131/87     Current Pharmacy:  [id number] - [place], [place] - [address] AT [facility] [address] [place] [address] Phone: [phone] Fax: [phone]    Objective:  Lab Results  Component Value Date   HGBA1C 6.0 05/12/2023    Lab Results  Component Value Date   CREATININE 0.76 07/30/2023   BUN 27 (H) 07/30/2023   NA 133 (L) 07/30/2023   K 5.2 07/30/2023   CL 97 (L) 07/30/2023   CO2 29 07/30/2023    Lab Results  Component Value Date   CHOL 193 05/12/2023   HDL 63.90 05/12/2023   LDLCALC 101 (H) 05/12/2023   LDLDIRECT 179.0 09/03/2021   TRIG 137.0 05/12/2023   CHOLHDL 3 05/12/2023    Medications Reviewed Today     Reviewed by Cecilie Coffee, RPH-CPP (Pharmacist) on 08/26/23 at 1150  Med List Status: <None>   Medication Order Taking? Sig Documenting Provider Last Dose Status Informant  ALPRAZolam  (XANAX ) 0.25 MG tablet [id number]  Take 1 tablet (0.25 mg total) by mouth daily as needed. Neda Balk, MD  Active  aspirin 325 MG tablet [id number]  Take 325 mg by mouth daily. [provider]  Active   atorvastatin  (LIPITOR) 80 MG tablet [id number] Yes TAKE 1 TABLET BY MOUTH EVERY DAY Neda Balk, MD  Active   Blood Glucose Monitoring Suppl (ONETOUCH VERIO FLEX SYSTEM) w/Device KIT [id number]  Use to check blood glucose 1 to 2 times per day Neda Balk, MD  Active   Cholecalciferol (VITAMIN D3) 125 MCG (5000 UT) CAPS [id number] Yes Take by mouth 2 (two) times a week. [provider]  Active   escitalopram  (LEXAPRO ) 10 MG tablet [id number] Yes Take 1 tablet (10 mg total) by mouth daily. Neda Balk, MD  Active    ezetimibe  (ZETIA ) 10 MG tablet [id number] Yes Take 1 tablet (10 mg total) by mouth daily. Neda Balk, MD  Active   fenofibrate  micronized (LOFIBRA) 134 MG capsule [id number] Yes TAKE 1 CAPSULE BY MOUTH DAILY BEFORE BREAKFAST. Neda Balk, MD  Active   furosemide  (LASIX ) 20 MG tablet [id number] Yes Take 1 tablet (20 mg total) by mouth daily. Neda Balk, MD  Active   glucose blood [facility] VERIO) test strip [id number] Yes Use to check blood glucose 1 to 2 times per day Neda Balk, MD  Active   hydrocortisone-pramoxine [facility]) 2.5-1 % rectal cream [id number]  as needed. [provider]  Active   irbesartan  (AVAPRO ) 150 MG tablet [id number] Yes Take 1 tablet (150 mg total) by mouth daily. Neda Balk, MD  Active   lactobacillus acidophilus (BACID) TABS tablet [id number]  Take 2 tablets by mouth 3 (three) times daily. [provider]  Active   leflunomide  (ARAVA ) 20 MG tablet [id number]  Take 1 tablet (20 mg total) by mouth daily.  Patient not taking: Reported on 08/26/2023   Romayne Clubs, PA-C  Active   levothyroxine  (SYNTHROID ) 75 MCG tablet [id number] Yes TAKE 1 TABLET BY MOUTH EVERY DAY BEFORE BREAKFAST Neda Balk, MD  Active   meloxicam  (MOBIC ) 7.5 MG tablet [id number] Yes Take 7.5 mg by mouth daily as needed.  Patient taking differently: Take 7.5 mg by mouth in the morning and at bedtime.   [provider]  Active     Discontinued 08/26/23 [phone] (Completed Course)   metoprolol  succinate (TOPROL -XL) 25 MG 24 hr tablet [id number] Yes Take 1 tablet (25 mg total) by mouth daily. TAKE WITH OR IMMEDIATELY FOLLOWING A MEAL. Neda Balk, MD  Active   OMEGA-3 FATTY ACIDS PO [id number]  Take by mouth. [provider]  Active   omeprazole  (PRILOSEC) 40 MG capsule [id number]  Take 1 capsule (40 mg total) by mouth daily. Neda Balk, MD  Active   ondansetron  (ZOFRAN -ODT) 4 MG disintegrating tablet [id number]  Take 4 mg by mouth as needed.  [provider]  Active   OneTouch Delica Lancets 33G MISC [id number]  Use to check blood glucose 1 to 2 times per day Neda Balk, MD  Active   oxyCODONE  (OXY IR/ROXICODONE ) 5 MG immediate release tablet [id number] Yes Take 5 mg by mouth every 4 (four) hours as needed. [provider]  Active     Discontinued 08/26/23 0856 (Completed Course)    Patient not taking:   Discontinued 08/26/23 0856 (Completed Course)    Patient not taking:   Discontinued 08/26/23 0857 (Patient Preference)   senna-docusate (SENOKOT-S) 8.6-50 MG tablet [id number] Yes Take 2 tablets by mouth daily as needed. [provider]  Active   TART CHERRY  PO [id number] Yes Take by mouth. [provider]  Active   tirzepatide  (MOUNJARO ) 10 MG/0.5ML Pen [id number] Yes Inject 10 mg into the skin once a week. Neda Balk, MD  Active   Trospium Chloride 60 MG CP24 [id number] Yes trospium ER 60 mg capsule,extended release 24 hr  TAKE 1 CAPSULE BY MOUTH EVERY DAY [provider]  Active   TURMERIC PO [id number] Yes Take 1 tablet by mouth 2 (two) times daily. [provider]  Active   Upadacitinib  ER (RINVOQ ) 15 MG TB24 [id number]  Take 1 tablet (15 mg total) by mouth daily.  Patient not taking: Reported on 08/26/2023   Nicholas Bari, MD  Active   verapamil  (CALAN -SR) 120 MG CR tablet [id number] Yes TAKE 1 TABLET BY MOUTH EVERYDAY AT BEDTIME Neda Balk, MD  Active   zoledronic  acid (RECLAST ) 5 MG/100ML SOLN injection [id number] Yes Inject 5 mg into the vein. Last dose 06/15/2023 [provider]  Active               Assessment/Plan:   Diabetes / obesity: Tolerating Mounjaro  well. Has lost 39 lbs. A1c and home blood glucose have improved.  - Increase Mounjaro  to 12.5mg  mg weekly.   - Continue with current diet changes.  - Reviewed home blood glucose readings and reviewed goals. Continue to check blood glucose at home 1 to 2 times per day. Fasting blood glucose  goal (before meals) = 80 to 130 Blood glucose goal after a meal = less than 180   Hypertension - last blood pressure at rheumatology office was elevated but last blood pressure in our office was at goal  - Continue current blood pressure medications. - Patient will be in office 09/24/2023 to see PCP if blood pressure not at goal could consider increasing irbesartan  to 300mg  daily.  Hyperlipidemia: Not at LDL goal; TG is now at goal  - Continue atorvastatin  80mg  daily, ezetimibe  10mg  daily and fenofibrate  134mg  daily.  -  If LDL still > 100 with next lipid check, could consider adding either Nexletol / nexlizet or PCSK9 agent.    Follow up with PCP 09/24/2023; Clinical Pharmacist Practitioner will check back with patient in 8 weeksto 12 weeks  Cecilie Coffee, PharmD Clinical Pharmacist [facility]

## 2023-08-28 DIAGNOSIS — M1711 Unilateral primary osteoarthritis, right knee: Secondary | ICD-10-CM | POA: Diagnosis not present

## 2023-08-31 DIAGNOSIS — M25561 Pain in right knee: Secondary | ICD-10-CM | POA: Diagnosis not present

## 2023-09-01 ENCOUNTER — Telehealth: Payer: Self-pay | Admitting: *Deleted

## 2023-09-01 NOTE — Telephone Encounter (Signed)
 FYI - Patient called, [facility] office visit 08/31/2023 in Care Everywhere, patient has resumed meds.  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 I refilled oxycodone  for nighttime use. Recommend Flexeril trial for muscle spasm complaint. Should improve with time and additional therapy. She does have my permission to resume her rheumatoid medicines at this point. I suspect that will help. Follow-up in 6 weeks for final x-ray and postoperative exam.

## 2023-09-01 NOTE — Telephone Encounter (Signed)
 Noted

## 2023-09-02 DIAGNOSIS — M5416 Radiculopathy, lumbar region: Secondary | ICD-10-CM | POA: Diagnosis not present

## 2023-09-02 DIAGNOSIS — M25561 Pain in right knee: Secondary | ICD-10-CM | POA: Diagnosis not present

## 2023-09-02 DIAGNOSIS — Z96651 Presence of right artificial knee joint: Secondary | ICD-10-CM | POA: Diagnosis not present

## 2023-09-02 DIAGNOSIS — R2689 Other abnormalities of gait and mobility: Secondary | ICD-10-CM | POA: Diagnosis not present

## 2023-09-02 DIAGNOSIS — M6281 Muscle weakness (generalized): Secondary | ICD-10-CM | POA: Diagnosis not present

## 2023-09-02 DIAGNOSIS — M48061 Spinal stenosis, lumbar region without neurogenic claudication: Secondary | ICD-10-CM | POA: Diagnosis not present

## 2023-09-07 DIAGNOSIS — M6281 Muscle weakness (generalized): Secondary | ICD-10-CM | POA: Diagnosis not present

## 2023-09-07 DIAGNOSIS — R2689 Other abnormalities of gait and mobility: Secondary | ICD-10-CM | POA: Diagnosis not present

## 2023-09-07 DIAGNOSIS — Z96651 Presence of right artificial knee joint: Secondary | ICD-10-CM | POA: Diagnosis not present

## 2023-09-07 DIAGNOSIS — M25561 Pain in right knee: Secondary | ICD-10-CM | POA: Diagnosis not present

## 2023-09-08 ENCOUNTER — Other Ambulatory Visit: Payer: Self-pay | Admitting: Family Medicine

## 2023-09-09 DIAGNOSIS — Z96651 Presence of right artificial knee joint: Secondary | ICD-10-CM | POA: Diagnosis not present

## 2023-09-09 DIAGNOSIS — R2689 Other abnormalities of gait and mobility: Secondary | ICD-10-CM | POA: Diagnosis not present

## 2023-09-09 DIAGNOSIS — M6281 Muscle weakness (generalized): Secondary | ICD-10-CM | POA: Diagnosis not present

## 2023-09-09 DIAGNOSIS — M25561 Pain in right knee: Secondary | ICD-10-CM | POA: Diagnosis not present

## 2023-09-14 DIAGNOSIS — Z96651 Presence of right artificial knee joint: Secondary | ICD-10-CM | POA: Diagnosis not present

## 2023-09-14 DIAGNOSIS — R2689 Other abnormalities of gait and mobility: Secondary | ICD-10-CM | POA: Diagnosis not present

## 2023-09-14 DIAGNOSIS — M6281 Muscle weakness (generalized): Secondary | ICD-10-CM | POA: Diagnosis not present

## 2023-09-14 DIAGNOSIS — M25561 Pain in right knee: Secondary | ICD-10-CM | POA: Diagnosis not present

## 2023-09-15 DIAGNOSIS — M9902 Segmental and somatic dysfunction of thoracic region: Secondary | ICD-10-CM | POA: Diagnosis not present

## 2023-09-15 DIAGNOSIS — M9903 Segmental and somatic dysfunction of lumbar region: Secondary | ICD-10-CM | POA: Diagnosis not present

## 2023-09-15 DIAGNOSIS — M9905 Segmental and somatic dysfunction of pelvic region: Secondary | ICD-10-CM | POA: Diagnosis not present

## 2023-09-15 DIAGNOSIS — M9908 Segmental and somatic dysfunction of rib cage: Secondary | ICD-10-CM | POA: Diagnosis not present

## 2023-09-17 DIAGNOSIS — M25561 Pain in right knee: Secondary | ICD-10-CM | POA: Diagnosis not present

## 2023-09-17 DIAGNOSIS — M6281 Muscle weakness (generalized): Secondary | ICD-10-CM | POA: Diagnosis not present

## 2023-09-17 DIAGNOSIS — Z96651 Presence of right artificial knee joint: Secondary | ICD-10-CM | POA: Diagnosis not present

## 2023-09-17 DIAGNOSIS — R2689 Other abnormalities of gait and mobility: Secondary | ICD-10-CM | POA: Diagnosis not present

## 2023-09-18 ENCOUNTER — Other Ambulatory Visit: Payer: Self-pay

## 2023-09-21 ENCOUNTER — Ambulatory Visit: Admitting: Podiatry

## 2023-09-21 DIAGNOSIS — M48061 Spinal stenosis, lumbar region without neurogenic claudication: Secondary | ICD-10-CM | POA: Diagnosis not present

## 2023-09-21 DIAGNOSIS — M5416 Radiculopathy, lumbar region: Secondary | ICD-10-CM | POA: Diagnosis not present

## 2023-09-21 NOTE — Assessment & Plan Note (Signed)
 Patient encouraged to maintain heart healthy diet, regular exercise, adequate sleep. Consider daily probiotics. Take medications as prescribed Labs ordered and reviewed Given and reviewed copy of ACP documents from [facility] Secretary of [place] and encouraged to complete and return  MM July 2023, b/l mastectomy so has been exempted from further Pap 2022 sees Dr Meisinger Colonoscopy 2022 repeat in 2027 Dexa 2023 Dr Dolphus Given and reviewed copy of ACP documents from [facility] Secretary of [place] and encouraged to complete and return

## 2023-09-21 NOTE — Assessment & Plan Note (Signed)
 hgba1c acceptable, minimize simple carbs. Increase exercise as tolerated.

## 2023-09-21 NOTE — Assessment & Plan Note (Signed)
 Encourage heart healthy diet such as MIND or DASH diet, increase exercise, avoid trans fats, simple carbohydrates and processed foods, consider a krill or fish or flaxseed oil cap daily.

## 2023-09-21 NOTE — Assessment & Plan Note (Signed)
Still struggling with grief after the loss of her son but fortunately her other son who is in the [place] is being redirected to [place] so he can be nearby to help her. Tolerating Escitalpram

## 2023-09-21 NOTE — Assessment & Plan Note (Signed)
 Tolerating Reclast . Bone density shows osteopenia, which is thinner than normal but not as bad as osteoporosis. Recommend calcium  intake of 1200 to 1500 mg daily, divided into roughly 3 doses. Best source is the diet and a single dairy serving is about 500 mg, a supplement of calcium  citrate once or twice daily to balance diet is fine if not getting enough in diet. Also need Vitamin D  2000 IU caps, 1 cap daily if not already taking vitamin D . Also recommend weight baring exercise on hips and upper body to keep bones strong

## 2023-09-21 NOTE — Assessment & Plan Note (Signed)
 Well controlled, no changes to meds. Encouraged heart healthy diet such as the DASH diet and exercise as tolerated.

## 2023-09-23 DIAGNOSIS — Z96651 Presence of right artificial knee joint: Secondary | ICD-10-CM | POA: Diagnosis not present

## 2023-09-23 DIAGNOSIS — R2689 Other abnormalities of gait and mobility: Secondary | ICD-10-CM | POA: Diagnosis not present

## 2023-09-23 DIAGNOSIS — M25561 Pain in right knee: Secondary | ICD-10-CM | POA: Diagnosis not present

## 2023-09-23 DIAGNOSIS — M6281 Muscle weakness (generalized): Secondary | ICD-10-CM | POA: Diagnosis not present

## 2023-09-24 ENCOUNTER — Ambulatory Visit: Payer: Self-pay | Admitting: Family Medicine

## 2023-09-24 ENCOUNTER — Encounter: Payer: Self-pay | Admitting: Family Medicine

## 2023-09-24 ENCOUNTER — Ambulatory Visit (INDEPENDENT_AMBULATORY_CARE_PROVIDER_SITE_OTHER): Payer: BLUE CROSS/BLUE SHIELD | Admitting: Family Medicine

## 2023-09-24 VITALS — BP 130/86 | HR 68 | Resp 16 | Ht 65.0 in | Wt 160.2 lb

## 2023-09-24 DIAGNOSIS — F32A Depression, unspecified: Secondary | ICD-10-CM

## 2023-09-24 DIAGNOSIS — Z0000 Encounter for general adult medical examination without abnormal findings: Secondary | ICD-10-CM

## 2023-09-24 DIAGNOSIS — E782 Mixed hyperlipidemia: Secondary | ICD-10-CM | POA: Diagnosis not present

## 2023-09-24 DIAGNOSIS — I10 Essential (primary) hypertension: Secondary | ICD-10-CM | POA: Diagnosis not present

## 2023-09-24 DIAGNOSIS — R739 Hyperglycemia, unspecified: Secondary | ICD-10-CM

## 2023-09-24 DIAGNOSIS — M810 Age-related osteoporosis without current pathological fracture: Secondary | ICD-10-CM

## 2023-09-24 DIAGNOSIS — F419 Anxiety disorder, unspecified: Secondary | ICD-10-CM

## 2023-09-24 DIAGNOSIS — M545 Low back pain, unspecified: Secondary | ICD-10-CM | POA: Insufficient documentation

## 2023-09-24 LAB — COMPREHENSIVE METABOLIC PANEL WITH GFR
ALT: 18 U/L (ref 0–35)
AST: 23 U/L (ref 0–37)
Albumin: 4.5 g/dL (ref 3.5–5.2)
Alkaline Phosphatase: 31 U/L — ABNORMAL LOW (ref 39–117)
BUN: 17 mg/dL (ref 6–23)
CO2: 29 meq/L (ref 19–32)
Calcium: 9.5 mg/dL (ref 8.4–10.5)
Chloride: 102 meq/L (ref 96–112)
Creatinine, Ser: 0.68 mg/dL (ref 0.40–1.20)
GFR: 93.83 mL/min (ref >60.00)
Glucose, Bld: 79 mg/dL (ref 70–99)
Potassium: 4.6 meq/L (ref 3.5–5.1)
Sodium: 138 meq/L (ref 135–145)
Total Bilirubin: 0.3 mg/dL (ref 0.2–1.2)
Total Protein: 6.9 g/dL (ref 6.0–8.3)

## 2023-09-24 NOTE — Progress Notes (Signed)
 Subjective:    Patient ID: Sandra [name], female    DOB: 03-30-1960, 63 y.o.   MRN: [id number]  Chief Complaint  Patient presents with   Annual Exam    Patient presents today for a physical exam.   Quality Metric Gaps    DEXA scan    HPI Discussed the use of AI scribe software for clinical note transcription with the patient, who gave verbal consent to proceed.  History of Present Illness Sandra [name] is a 63 year old female with rheumatoid arthritis and osteoporosis who presents for follow-up on chronic pain management and recent knee surgery as well as annual preventative exam.  She is currently disabled due to her medical conditions, including rheumatoid arthritis and osteoporosis, and is seeking a disabled parking permit to assist with mobility.  She underwent a right knee replacement on Aug 04, 2023, and is currently in therapy. She reports some improvement in her knee, but also reports daily back pain from a herniated disc at L4-L5, which affects her sleep and overall quality of life. She experiences daily back pain that affects her sleep and overall quality of life. She is under the care of a spine doctor at [facility] and received an epidural injection on Monday, which has not provided relief.  She has tried various treatments for her back pain, including traction, lidocaine  patches, and meloxicam . She is not taking any narcotics due to their side effects. She uses Flexeril (cyclobenzaprine) 10 mg for muscle spasms related to her knee surgery, but it does not alleviate her back pain.  She is also on Mounjaro  and has lost significant weight, going from 207 lbs to 158 lbs. Her last A1c was 5.4 in May, indicating good blood sugar control. She is on Lexapro  10 mg for mood management.  She has a history of osteopenia, with a recent DEXA scan in July 2023 confirming this diagnosis. She receives Reclast  infusions annually, with the last one administered in April  2025.  Her sodium levels were slightly low at 133, and she is agreeable to having this rechecked. She maintains regular dental visits and reports no significant changes in her family history.    Past Medical History:  Diagnosis Date   Allergic rhinitis 05/31/2012   Breast cancer (HCC)    right breast, dx 10/2021.   Chest pain    PT SAW CARDIOLOGIST DR. WONDA - STRESS ECHO DONE NEGATIVE - NO PROBLEM SINCE   Chicken pox as achild   Cough 11/29/2012   RESOLVED - IT WAS CAUSED BY LISINOPRIL   COVID-19    11/2022   Elevated LFTs 05/31/2012   Eustachian tube dysfunction 07/27/2017   GERD (gastroesophageal reflux disease)    H/O tobacco use, presenting hazards to health 11/29/2012   Smoked 1 1/2 ppd for 20 years quit in roughtly 2007   Headache 11/25/2015   Hematuria 04/09/2014   Hoarseness 02/04/2012   RESOLVED - THOUGHT TO HAVE BEEN ALLERY RELATED   Hyperglycemia 04/06/2014   Hyperlipidemia    Hyperparathyroidism (HCC)    Hypertension    Obesity, unspecified 05/31/2012   Ocular migraine    Overactive bladder 05/31/2012   Pinched nerve 07/23/2023   Right hip   Preventative health care 02/04/2012   PTSD (post-traumatic stress disorder)    Rapid heart beat    PT FEELS PROB RELATED TO HYPER PARATHYROIDISM   Rheumatoid arthritis (HCC)    SUI (stress urinary incontinence, female) 02/04/2012   Sees Dr Scottie and they have  discussed a bladder tack but so far she declines   Walking pneumonia 03/17/2023    Past Surgical History:  Procedure Laterality Date   BREAST EXCISIONAL BIOPSY Left    Age 37 Fibroadenoma   BREAST SURGERY     left breast excision of adenoma   btl     COLONOSCOPY  07/2020   FOOT SURGERY     MASTECTOMY Bilateral 11/28/2021   PARATHYROIDECTOMY Right 08/12/2013   Procedure: right inferior frozen section PARATHYROIDECTOMY;  Surgeon: Krystal CHRISTELLA Spinner, MD;  Location: [facility];  Service: General;  Laterality: Right;   PELVIC LAPAROSCOPY  03/2004   PLACEMENT OF  BREAST IMPLANTS  01/15/2023   History of breast cancer   TISSUE EXPANDER PLACEMENT Bilateral 08/14/2022   at [facility]   TOTAL SHOULDER REPLACEMENT Right 01/2019   TUBAL LIGATION     WISDOM TOOTH EXTRACTION  30 yrs ago    Family History  Problem Relation Age of Onset   Hypertension Mother 32       alive   Renal cancer Mother        renal   Hyperlipidemia Mother    Osteoporosis Mother        humerus    Macular degeneration Mother    Tremor Mother    Parkinson's disease Mother    Hypertension Father 56       alive   Atrial fibrillation Father    Colon cancer Father        colon   Hyperlipidemia Father    Stroke Father    Alzheimer's disease Father    Hypertension Sister    Hypertension Brother 39       alive   Tremor Brother    Asthma Son    Eczema Son    Healthy Son    Heart disease Paternal Aunt    Heart disease Paternal Uncle    Lung cancer Maternal Grandfather        lung/ smoker   Emphysema Maternal Grandfather    Heart disease Paternal Grandmother    Other Paternal Grandfather        black lung   Heart disease Paternal Grandfather     Social History   Socioeconomic History   Marital status: Married    Spouse name: Not on file   Number of children: 2   Years of education: Not on file   Highest education level: Associate degree: academic program  Occupational History   Occupation: Teacher, adult education: Advertising copywriter  Tobacco Use   Smoking status: Former    Current packs/day: 0.00    Average packs/day: 1 pack/day for 20.0 years (20.0 ttl pk-yrs)    Types: Cigarettes    Start date: 03/10/1985    Quit date: 03/10/2005    Years since quitting: 18.5    Passive exposure: Never   Smokeless tobacco: Never  Vaping Use   Vaping status: Never Used  Substance and Sexual Activity   Alcohol use: Not Currently   Drug use: No   Sexual activity: Yes    Partners: Male  Other Topics Concern   Not on file  Social History Narrative   Not on file   Social Drivers  of Health   Financial Resource Strain: Low Risk  (03/16/2023)   Overall Financial Resource Strain (CARDIA)    Difficulty of Paying Living Expenses: Not hard at all  Food Insecurity: No Food Insecurity (03/16/2023)   Hunger Vital Sign    Worried About Running Out of Food  in the Last Year: Never true    Ran Out of Food in the Last Year: Never true  Transportation Needs: No Transportation Needs (03/16/2023)   PRAPARE - Administrator, Civil Service (Medical): No    Lack of Transportation (Non-Medical): No  Physical Activity: Inactive (03/16/2023)   Exercise Vital Sign    Days of Exercise per Week: 2 days    Minutes of Exercise per Session: 0 min  Stress: No Stress Concern Present (03/16/2023)   [employer] of Occupational Health - Occupational Stress Questionnaire    Feeling of Stress : Not at all  Recent Concern: Stress - Stress Concern Present (02/03/2023)   [employer] of Occupational Health - Occupational Stress Questionnaire    Feeling of Stress : To some extent  Social Connections: Moderately Integrated (03/16/2023)   Social Connection and Isolation Panel    Frequency of Communication with Friends and Family: More than three times a week    Frequency of Social Gatherings with Friends and Family: Once a week    Attends Religious Services: 1 to 4 times per year    Active Member of [employer] or Organizations: No    Attends Engineer, structural: Not on file    Marital Status: Married  Catering manager Violence: Not At Risk (07/20/2023)   Received from [facility]   HITS    Over the last 12 months how often did your partner physically hurt you?: Never    Over the last 12 months how often did your partner insult you or talk down to you?: Never    Over the last 12 months how often did your partner threaten you with physical harm?: Never    Over the last 12 months how often did your partner scream or curse at you?: Never    Outpatient Medications Prior to Visit   Medication Sig Dispense Refill   ALPRAZolam  (XANAX ) 0.25 MG tablet Take 1 tablet (0.25 mg total) by mouth daily as needed. 15 tablet 0   aspirin 325 MG tablet Take 325 mg by mouth daily.     atorvastatin  (LIPITOR) 80 MG tablet TAKE 1 TABLET BY MOUTH EVERY DAY 90 tablet 1   Blood Glucose Monitoring Suppl (ONETOUCH VERIO FLEX SYSTEM) w/Device KIT Use to check blood glucose 1 to 2 times per day 1 kit 0   Cholecalciferol (VITAMIN D3) 125 MCG (5000 UT) CAPS Take by mouth 2 (two) times a week.     escitalopram  (LEXAPRO ) 10 MG tablet Take 1 tablet (10 mg total) by mouth daily. 90 tablet 1   ezetimibe  (ZETIA ) 10 MG tablet Take 1 tablet (10 mg total) by mouth daily. 90 tablet 1   fenofibrate  micronized (LOFIBRA) 134 MG capsule TAKE 1 CAPSULE BY MOUTH DAILY BEFORE BREAKFAST. 90 capsule 1   furosemide  (LASIX ) 20 MG tablet Take 1 tablet (20 mg total) by mouth daily. 90 tablet 1   glucose blood (ONETOUCH VERIO) test strip Use to check blood glucose 1 to 2 times per day 50 each 5   hydrocortisone-pramoxine (ANALPRAM-HC) 2.5-1 % rectal cream as needed.     irbesartan  (AVAPRO ) 150 MG tablet Take 1 tablet (150 mg total) by mouth daily. 90 tablet 1   lactobacillus acidophilus (BACID) TABS tablet Take 2 tablets by mouth 3 (three) times daily.     leflunomide  (ARAVA ) 20 MG tablet Take 1 tablet (20 mg total) by mouth daily. 90 tablet 0   levothyroxine  (SYNTHROID ) 75 MCG tablet TAKE 1 TABLET BY MOUTH  EVERY DAY BEFORE BREAKFAST 90 tablet 1   meloxicam  (MOBIC ) 7.5 MG tablet Take 7.5 mg by mouth daily as needed. (Patient taking differently: Take 7.5 mg by mouth in the morning and at bedtime.)     metoprolol  succinate (TOPROL -XL) 25 MG 24 hr tablet Take 1 tablet (25 mg total) by mouth daily. TAKE WITH OR IMMEDIATELY FOLLOWING A MEAL. 90 tablet 1   OMEGA-3 FATTY ACIDS PO Take by mouth.     omeprazole  (PRILOSEC) 40 MG capsule TAKE 1 CAPSULE (40 MG TOTAL) BY MOUTH DAILY. 90 capsule 1   ondansetron  (ZOFRAN -ODT) 4 MG  disintegrating tablet Take 4 mg by mouth as needed.     OneTouch Delica Lancets 33G MISC Use to check blood glucose 1 to 2 times per day 100 each 5   TART CHERRY PO Take by mouth.     tirzepatide  (MOUNJARO ) 12.5 MG/0.5ML Pen Inject 12.5 mg into the skin once a week. 2 mL 1   Trospium Chloride 60 MG CP24 trospium ER 60 mg capsule,extended release 24 hr  TAKE 1 CAPSULE BY MOUTH EVERY DAY     TURMERIC PO Take 1 tablet by mouth 2 (two) times daily.     Upadacitinib  ER (RINVOQ ) 15 MG TB24 Take 1 tablet (15 mg total) by mouth daily. 30 tablet 2   verapamil  (CALAN -SR) 120 MG CR tablet TAKE 1 TABLET BY MOUTH EVERYDAY AT BEDTIME 90 tablet 1   zoledronic  acid (RECLAST ) 5 MG/100ML SOLN injection Inject 5 mg into the vein. Last dose 06/15/2023     oxyCODONE  (OXY IR/ROXICODONE ) 5 MG immediate release tablet Take 5 mg by mouth every 4 (four) hours as needed.     diclofenac (VOLTAREN) 75 MG EC tablet  (Patient not taking: Reported on 03/18/2023)     senna-docusate (SENOKOT-S) 8.6-50 MG tablet Take 2 tablets by mouth daily as needed.     No facility-administered medications prior to visit.    Allergies  Allergen Reactions   Cefdinir Other (See Comments) and Nausea And Vomiting    Severe Stomach cramps and diarrhea for several days.   Codeine Nausea And Vomiting   Doxycycline  Diarrhea   Penicillins     RASH, ITCHING   Metformin And Related     Diarrhea    Other Rash   Simvastatin      myalgias    ROS     Objective:    Physical Exam  BP 130/86   Pulse 68   Resp 16   Ht 5' 5 (1.651 m)   Wt 160 lb 3.2 oz (72.7 kg)   LMP 04/01/2011   SpO2 96%   BMI 26.66 kg/m  Wt Readings from Last 3 Encounters:  09/24/23 160 lb 3.2 oz (72.7 kg)  08/26/23 167 lb (75.8 kg)  07/30/23 165 lb (74.8 kg)    Diabetic Foot Exam - Simple   No data filed    Lab Results  Component Value Date   WBC 10.2 07/30/2023   HGB 12.1 07/30/2023   HCT 36.9 07/30/2023   PLT 396 07/30/2023   GLUCOSE 156 (H)  07/30/2023   CHOL 193 05/12/2023   TRIG 137.0 05/12/2023   HDL 63.90 05/12/2023   LDLDIRECT 179.0 09/03/2021   LDLCALC 101 (H) 05/12/2023   ALT 22 07/30/2023   AST 18 07/30/2023   NA 133 (L) 07/30/2023   K 5.2 07/30/2023   CL 97 (L) 07/30/2023   CREATININE 0.76 07/30/2023   BUN 27 (H) 07/30/2023   CO2 29 07/30/2023  TSH 1.92 05/12/2023   HGBA1C 6.0 05/12/2023    Lab Results  Component Value Date   TSH 1.92 05/12/2023   Lab Results  Component Value Date   WBC 10.2 07/30/2023   HGB 12.1 07/30/2023   HCT 36.9 07/30/2023   MCV 93.2 07/30/2023   PLT 396 07/30/2023   Lab Results  Component Value Date   NA 133 (L) 07/30/2023   K 5.2 07/30/2023   CO2 29 07/30/2023   GLUCOSE 156 (H) 07/30/2023   BUN 27 (H) 07/30/2023   CREATININE 0.76 07/30/2023   BILITOT 0.4 07/30/2023   ALKPHOS 39 05/12/2023   AST 18 07/30/2023   ALT 22 07/30/2023   PROT 6.5 07/30/2023   ALBUMIN 4.4 05/12/2023   CALCIUM  10.0 07/30/2023   ANIONGAP 10 11/01/2020   EGFR 89 07/30/2023   GFR 82.04 05/12/2023   Lab Results  Component Value Date   CHOL 193 05/12/2023   Lab Results  Component Value Date   HDL 63.90 05/12/2023   Lab Results  Component Value Date   LDLCALC 101 (H) 05/12/2023   Lab Results  Component Value Date   TRIG 137.0 05/12/2023   Lab Results  Component Value Date   CHOLHDL 3 05/12/2023   Lab Results  Component Value Date   HGBA1C 6.0 05/12/2023       Assessment & Plan:  Anxiety and depression Assessment & Plan: Still struggling with grief after the loss of her son but fortunately her other son who is in the military is being redirected to [place] so he can be nearby to help her. Tolerating Escitalpram   Hyperglycemia Assessment & Plan: hgba1c acceptable, minimize simple carbs. Increase exercise as tolerated.    Mixed hyperlipidemia Assessment & Plan: Encourage heart healthy diet such as MIND or DASH diet, increase exercise, avoid trans fats, simple  carbohydrates and processed foods, consider a krill or fish or flaxseed oil cap daily.     Primary hypertension Assessment & Plan: Well controlled, no changes to meds. Encouraged heart healthy diet such as the DASH diet and exercise as tolerated.   Orders: -     Comprehensive metabolic panel with GFR  Osteoporosis, unspecified osteoporosis type, unspecified pathological fracture presence Assessment & Plan: Tolerating Reclast . Bone density shows osteopenia, which is thinner than normal but not as bad as osteoporosis. Recommend calcium  intake of 1200 to 1500 mg daily, divided into roughly 3 doses. Best source is the diet and a single dairy serving is about 500 mg, a supplement of calcium  citrate once or twice daily to balance diet is fine if not getting enough in diet. Also need Vitamin D  2000 IU caps, 1 cap daily if not already taking vitamin D . Also recommend weight baring exercise on hips and upper body to keep bones strong    Preventative health care Assessment & Plan: Patient encouraged to maintain heart healthy diet, regular exercise, adequate sleep. Consider daily probiotics. Take medications as prescribed Labs ordered and reviewed Given and reviewed copy of ACP documents from [facility] Secretary of [place] and encouraged to complete and return  MM July 2023, b/l mastectomy so has been exempted from further Pap 2022 sees Dr Meisinger Colonoscopy 2022 repeat in 2027 Dexa 2023 Dr Dolphus Given and reviewed copy of ACP documents from [facility] Secretary of State and encouraged to complete and return    Low back pain, unspecified back pain laterality, unspecified chronicity, unspecified whether sciatica present Assessment & Plan: Has a herniated disc at L4 and is working with  ortho cheron had a recent epidural injection was not helpful     Assessment and Plan Assessment & Plan Chronic Back Pain due to Herniated Disc at L4-L5 Chronic back pain from L4-L5 herniated disc, affecting sleep and  quality of life. Epidural injection ineffective; hesitant for further injections. Spine specialist noted variable outcomes. Lidocaine  patches, traction, meloxicam  tried. Narcotics avoided due to side effects and limited efficacy. - Continue follow-up with spine specialist at [facility] - Consider alternative pain management strategies such as nerve blocks or implantable devices. - Discuss potential use of tizanidine (Zanaflex) as a muscle relaxant with the spine specialist.  Postoperative Care for Right Knee Replacement Post-knee replacement care with physical therapy progress hindered by back pain. Quadriceps spasms managed with cyclobenzaprine, ineffective for back pain. Tizanidine suggested as alternative. - Continue physical therapy for knee rehabilitation. - Follow up with knee surgeon on August 6. - Consider discussing the use of tizanidine (Zanaflex) for muscle spasms with the knee surgeon.  Type 2 Diabetes Mellitus Type 2 diabetes well-controlled with A1c of 5.4. On tirzepatide  with significant weight loss, beneficial for diabetes management. - Continue current diabetes management regimen with tirzepatide . - Monitor blood glucose levels regularly.  Osteoporosis Osteoporosis with recent DEXA scan showing osteopenia. On Reclast  infusions, last in April 2023. Possible improvement from osteoporosis to osteopenia with treatment. - Continue annual Reclast  infusions. - Discuss DEXA scan results and future management with Dr. Arneta.  Hyponatremia Mild hyponatremia with sodium level of 133. Monitoring necessary to prevent further decline. - Repeat metabolic panel to check sodium levels.  General Health Maintenance General health maintenance up to date. Advised to maintain hydration, healthy diet, and activity within physical limitations. - Repeat Pap smear in 2027. - Continue regular dental check-ups. - Maintain hydration and a healthy diet.  Goals of Care Advanced care  planning documents at home, not in medical record. Importance of accessibility in emergencies emphasized. - Provide a copy of advanced care planning documents for the medical record.  Follow-up Advised to follow up as needed, no later than six months. Under care of nurse practitioner for refills and minor issues when primary physician unavailable. - Schedule follow-up appointment in January 2024. - Contact the office if any issues arise before the next scheduled visit.     Harlene Horton, MD

## 2023-09-24 NOTE — Patient Instructions (Addendum)
 Tizanidine/Zanaflex can replace Flexeril/Cyclobenzaprine  Increase Lexapro  to 20 mg daily  Bring in copy of your advanced directives   Preventive Care 49-62 Years Old, Female Preventive care refers to lifestyle choices and visits with your health care provider that can promote health and wellness. Preventive care visits are also called wellness exams. What can I expect for my preventive care visit? Counseling Your health care provider may ask you questions about your: Medical history, including: Past medical problems. Family medical history. Pregnancy history. Current health, including: Menstrual cycle. Method of birth control. Emotional well-being. Home life and relationship well-being. Sexual activity and sexual health. Lifestyle, including: Alcohol, nicotine or tobacco, and drug use. Access to firearms. Diet, exercise, and sleep habits. Work and work Astronomer. Sunscreen use. Safety issues such as seatbelt and bike helmet use. Physical exam Your health care provider will check your: Height and weight. These may be used to calculate your BMI (body mass index). BMI is a measurement that tells if you are at a healthy weight. Waist circumference. This measures the distance around your waistline. This measurement also tells if you are at a healthy weight and may help predict your risk of certain diseases, such as type 2 diabetes and high blood pressure. Heart rate and blood pressure. Body temperature. Skin for abnormal spots. What immunizations do I need?  Vaccines are usually given at various ages, according to a schedule. Your health care provider will recommend vaccines for you based on your age, medical history, and lifestyle or other factors, such as travel or where you work. What tests do I need? Screening Your health care provider may recommend screening tests for certain conditions. This may include: Lipid and cholesterol levels. Diabetes screening. This is done by  checking your blood sugar (glucose) after you have not eaten for a while (fasting). Pelvic exam and Pap test. Hepatitis B test. Hepatitis C test. HIV (human immunodeficiency virus) test. STI (sexually transmitted infection) testing, if you are at risk. Lung cancer screening. Colorectal cancer screening. Mammogram. Talk with your health care provider about when you should start having regular mammograms. This may depend on whether you have a family history of breast cancer. BRCA-related cancer screening. This may be done if you have a family history of breast, ovarian, tubal, or peritoneal cancers. Bone density scan. This is done to screen for osteoporosis. Talk with your health care provider about your test results, treatment options, and if necessary, the need for more tests. Follow these instructions at home: Eating and drinking  Eat a diet that includes fresh fruits and vegetables, whole grains, lean protein, and low-fat dairy products. Take vitamin and mineral supplements as recommended by your health care provider. Do not drink alcohol if: Your health care provider tells you not to drink. You are pregnant, may be pregnant, or are planning to become pregnant. If you drink alcohol: Limit how much you have to 0-1 drink a day. Know how much alcohol is in your drink. In the [place] one drink equals one 12 oz bottle of beer (355 mL), one 5 oz glass of wine (148 mL), or one 1 oz glass of hard liquor (44 mL). Lifestyle Brush your teeth every morning and night with fluoride toothpaste. Floss one time each day. Exercise for at least 30 minutes 5 or more days each week. Do not use any products that contain nicotine or tobacco. These products include cigarettes, chewing tobacco, and vaping devices, such as e-cigarettes. If you need help quitting, ask your health care provider.  Do not use drugs. If you are sexually active, practice safe sex. Use a condom or other form of protection to prevent  STIs. If you do not wish to become pregnant, use a form of birth control. If you plan to become pregnant, see your health care provider for a prepregnancy visit. Take aspirin only as told by your health care provider. Make sure that you understand how much to take and what form to take. Work with your health care provider to find out whether it is safe and beneficial for you to take aspirin daily. Find healthy ways to manage stress, such as: Meditation, yoga, or listening to music. Journaling. Talking to a trusted person. Spending time with friends and family. Minimize exposure to UV radiation to reduce your risk of skin cancer. Safety Always wear your seat belt while driving or riding in a vehicle. Do not drive: If you have been drinking alcohol. Do not ride with someone who has been drinking. When you are tired or distracted. While texting. If you have been using any mind-altering substances or drugs. Wear a helmet and other protective equipment during sports activities. If you have firearms in your house, make sure you follow all gun safety procedures. Seek help if you have been physically or sexually abused. What's next? Visit your health care provider once a year for an annual wellness visit. Ask your health care provider how often you should have your eyes and teeth checked. Stay up to date on all vaccines. This information is not intended to replace advice given to you by your health care provider. Make sure you discuss any questions you have with your health care provider. Document Revised: 08/22/2020 Document Reviewed: 08/22/2020 Elsevier Patient Education  2024 [employer].

## 2023-09-24 NOTE — Assessment & Plan Note (Signed)
 Has a herniated disc at L4 and is working with ortho martinique had a recent epidural injection was not helpful

## 2023-10-01 ENCOUNTER — Other Ambulatory Visit: Payer: Self-pay | Admitting: Family Medicine

## 2023-10-05 ENCOUNTER — Ambulatory Visit (INDEPENDENT_AMBULATORY_CARE_PROVIDER_SITE_OTHER): Admitting: Podiatry

## 2023-10-05 ENCOUNTER — Encounter: Payer: Self-pay | Admitting: Podiatry

## 2023-10-05 DIAGNOSIS — M19071 Primary osteoarthritis, right ankle and foot: Secondary | ICD-10-CM

## 2023-10-05 DIAGNOSIS — M9905 Segmental and somatic dysfunction of pelvic region: Secondary | ICD-10-CM | POA: Diagnosis not present

## 2023-10-05 DIAGNOSIS — M9903 Segmental and somatic dysfunction of lumbar region: Secondary | ICD-10-CM | POA: Diagnosis not present

## 2023-10-05 DIAGNOSIS — M19072 Primary osteoarthritis, left ankle and foot: Secondary | ICD-10-CM | POA: Diagnosis not present

## 2023-10-05 DIAGNOSIS — M9908 Segmental and somatic dysfunction of rib cage: Secondary | ICD-10-CM | POA: Diagnosis not present

## 2023-10-05 NOTE — Progress Notes (Signed)
 Chief Complaint  Patient presents with   Foot Pain    Follow up arthritis dorsal midfoot bilateral   Its time for the injections again    Subjective:  63 year old female PMHx rheumatoid arthritis presenting today for follow-up management of chronic bilateral midtarsal pain and arthritis.  She is being seen by rheumatology.  Injections in March 2025 have helped significantly.  She currently has no pain.  She is also requesting new orthotics today.  Custom orthotics in the past have helped significantly to support her feet and alleviate her midfoot pain and arthritis  Past Medical History:  Diagnosis Date   Allergic rhinitis 05/31/2012   Breast cancer (HCC)    right breast, dx 10/2021.   Chest pain    PT SAW CARDIOLOGIST DR. WONDA - STRESS ECHO DONE NEGATIVE - NO PROBLEM SINCE   Chicken pox as achild   Cough 11/29/2012   RESOLVED - IT WAS CAUSED BY LISINOPRIL   COVID-19    11/2022   Elevated LFTs 05/31/2012   Eustachian tube dysfunction 07/27/2017   GERD (gastroesophageal reflux disease)    H/O tobacco use, presenting hazards to health 11/29/2012   Smoked 1 1/2 ppd for 20 years quit in roughtly 2007   Headache 11/25/2015   Hematuria 04/09/2014   Hoarseness 02/04/2012   RESOLVED - THOUGHT TO HAVE BEEN ALLERY RELATED   Hyperglycemia 04/06/2014   Hyperlipidemia    Hyperparathyroidism (HCC)    Hypertension    Obesity, unspecified 05/31/2012   Ocular migraine    Overactive bladder 05/31/2012   Pinched nerve 07/23/2023   Right hip   Preventative health care 02/04/2012   PTSD (post-traumatic stress disorder)    Rapid heart beat    PT FEELS PROB RELATED TO HYPER PARATHYROIDISM   Rheumatoid arthritis (HCC)    SUI (stress urinary incontinence, female) 02/04/2012   Sees Dr Scottie and they have discussed a bladder tack but so far she declines   Walking pneumonia 03/17/2023   Past Surgical History:  Procedure Laterality Date   BREAST EXCISIONAL BIOPSY Left    Age 58  Fibroadenoma   BREAST SURGERY     left breast excision of adenoma   btl     COLONOSCOPY  07/2020   FOOT SURGERY     MASTECTOMY Bilateral 11/28/2021   PARATHYROIDECTOMY Right 08/12/2013   Procedure: right inferior frozen section PARATHYROIDECTOMY;  Surgeon: Krystal CHRISTELLA Spinner, MD;  Location: [facility];  Service: General;  Laterality: Right;   PELVIC LAPAROSCOPY  03/2004   PLACEMENT OF BREAST IMPLANTS  01/15/2023   History of breast cancer   TISSUE EXPANDER PLACEMENT Bilateral 08/14/2022   at [facility] REPLACEMENT Right 01/2019   TUBAL LIGATION     WISDOM TOOTH EXTRACTION  30 yrs ago   Allergies  Allergen Reactions   Cefdinir Other (See Comments) and Nausea And Vomiting    Severe Stomach cramps and diarrhea for several days.   Codeine Nausea And Vomiting   Doxycycline  Diarrhea   Penicillins     RASH, ITCHING   Metformin And Related     Diarrhea    Other Rash   Simvastatin      myalgias   Objective: Physical Exam General: The patient is alert and oriented x3 in no acute distress.  Dermatology: Skin is cool, dry and supple bilateral lower extremities. Negative for open lesions or macerations.  Vascular: Palpable pedal pulses bilaterally.  Capillary refill WNL.  Clinically no concern for vascular compromise  Neurological: Grossly intact  via light touch  Musculoskeletal Exam: Chronic pain on palpation throughout the midtarsal joint bilateral  Radiographic exam B/L feet 05/20/2023 Advanced severe degenerative changes noted throughout the lesser TMT joints consistent with the patient's given history of rheumatoid and osteoarthritis.  Collapse of the midtarsal joints also noted left greater than the right  Assessment: 1. Chronic bilateral midfoot capsulitis/arthritis 2. History of rheumatoid arthritis; managed by rheumatology  Plan of Care:  -Patient was evaluated.   - Currently the patient does not have any flareups or severe pain to the feet bilaterally.  We will  hold off on injection for now -Today patient was molded for custom orthotics -Continue close management with rheumatology - Return to clinic for orthotics pickup and she will plan to schedule back-to-back appointments with me for possible cortisone injections if needed  Thresa EMERSON Sar, DPM [facility]  Dr. Thresa EMERSON Sar, DPM    [address] [place], [place] [address]                Office [phone]  Fax [phone]

## 2023-10-07 DIAGNOSIS — M9908 Segmental and somatic dysfunction of rib cage: Secondary | ICD-10-CM | POA: Diagnosis not present

## 2023-10-07 DIAGNOSIS — M25561 Pain in right knee: Secondary | ICD-10-CM | POA: Diagnosis not present

## 2023-10-07 DIAGNOSIS — M9903 Segmental and somatic dysfunction of lumbar region: Secondary | ICD-10-CM | POA: Diagnosis not present

## 2023-10-07 DIAGNOSIS — Z96651 Presence of right artificial knee joint: Secondary | ICD-10-CM | POA: Diagnosis not present

## 2023-10-07 DIAGNOSIS — M9905 Segmental and somatic dysfunction of pelvic region: Secondary | ICD-10-CM | POA: Diagnosis not present

## 2023-10-07 DIAGNOSIS — R2689 Other abnormalities of gait and mobility: Secondary | ICD-10-CM | POA: Diagnosis not present

## 2023-10-07 DIAGNOSIS — M6281 Muscle weakness (generalized): Secondary | ICD-10-CM | POA: Diagnosis not present

## 2023-10-09 DIAGNOSIS — Z96651 Presence of right artificial knee joint: Secondary | ICD-10-CM | POA: Diagnosis not present

## 2023-10-09 DIAGNOSIS — M25561 Pain in right knee: Secondary | ICD-10-CM | POA: Diagnosis not present

## 2023-10-09 DIAGNOSIS — R2689 Other abnormalities of gait and mobility: Secondary | ICD-10-CM | POA: Diagnosis not present

## 2023-10-09 DIAGNOSIS — M6281 Muscle weakness (generalized): Secondary | ICD-10-CM | POA: Diagnosis not present

## 2023-10-13 DIAGNOSIS — M48061 Spinal stenosis, lumbar region without neurogenic claudication: Secondary | ICD-10-CM | POA: Diagnosis not present

## 2023-10-13 DIAGNOSIS — M5416 Radiculopathy, lumbar region: Secondary | ICD-10-CM | POA: Diagnosis not present

## 2023-10-13 DIAGNOSIS — M810 Age-related osteoporosis without current pathological fracture: Secondary | ICD-10-CM | POA: Diagnosis not present

## 2023-10-14 DIAGNOSIS — M25561 Pain in right knee: Secondary | ICD-10-CM | POA: Diagnosis not present

## 2023-10-20 DIAGNOSIS — M8588 Other specified disorders of bone density and structure, other site: Secondary | ICD-10-CM | POA: Diagnosis not present

## 2023-10-20 DIAGNOSIS — M48061 Spinal stenosis, lumbar region without neurogenic claudication: Secondary | ICD-10-CM | POA: Diagnosis not present

## 2023-10-20 DIAGNOSIS — M8589 Other specified disorders of bone density and structure, multiple sites: Secondary | ICD-10-CM | POA: Diagnosis not present

## 2023-10-20 DIAGNOSIS — M85851 Other specified disorders of bone density and structure, right thigh: Secondary | ICD-10-CM | POA: Diagnosis not present

## 2023-10-20 DIAGNOSIS — M5416 Radiculopathy, lumbar region: Secondary | ICD-10-CM | POA: Diagnosis not present

## 2023-10-26 ENCOUNTER — Other Ambulatory Visit (HOSPITAL_COMMUNITY): Payer: Self-pay

## 2023-10-26 ENCOUNTER — Other Ambulatory Visit: Payer: Self-pay

## 2023-10-26 NOTE — Progress Notes (Signed)
 Specialty Pharmacy Refill Coordination Note  Sandra [name] is a 62 y.o. female contacted today regarding refills of specialty medication(s) Upadacitinib  (Rinvoq )   Patient requested Delivery   Delivery date: 10/28/23   Verified address: [address]   [place] [address]   Medication will be filled on 10/27/23.

## 2023-10-27 DIAGNOSIS — M5416 Radiculopathy, lumbar region: Secondary | ICD-10-CM | POA: Diagnosis not present

## 2023-10-27 DIAGNOSIS — M4316 Spondylolisthesis, lumbar region: Secondary | ICD-10-CM | POA: Diagnosis not present

## 2023-10-27 DIAGNOSIS — M48061 Spinal stenosis, lumbar region without neurogenic claudication: Secondary | ICD-10-CM | POA: Diagnosis not present

## 2023-11-04 ENCOUNTER — Ambulatory Visit: Admitting: Podiatry

## 2023-11-04 ENCOUNTER — Ambulatory Visit (INDEPENDENT_AMBULATORY_CARE_PROVIDER_SITE_OTHER)

## 2023-11-04 DIAGNOSIS — M19071 Primary osteoarthritis, right ankle and foot: Secondary | ICD-10-CM | POA: Diagnosis not present

## 2023-11-04 DIAGNOSIS — M2142 Flat foot [pes planus] (acquired), left foot: Secondary | ICD-10-CM

## 2023-11-04 DIAGNOSIS — M2141 Flat foot [pes planus] (acquired), right foot: Secondary | ICD-10-CM

## 2023-11-04 DIAGNOSIS — M19072 Primary osteoarthritis, left ankle and foot: Secondary | ICD-10-CM

## 2023-11-04 DIAGNOSIS — M778 Other enthesopathies, not elsewhere classified: Secondary | ICD-10-CM | POA: Diagnosis not present

## 2023-11-04 NOTE — Progress Notes (Signed)
 Patient presents today to pick up custom molded foot orthotics, diagnosed with Arthritis of Mid foot Bilateral  by Dr. Janit.   Orthotics were dispensed and fit was satisfactory. Reviewed instructions for break-in and wear. Written instructions given to patient.  Patient will follow up as needed.   Lolita Schultze Cped, CFo, CFm

## 2023-11-10 ENCOUNTER — Encounter: Payer: Self-pay | Admitting: Pharmacist

## 2023-11-10 ENCOUNTER — Other Ambulatory Visit: Payer: Self-pay | Admitting: Pharmacist

## 2023-11-10 DIAGNOSIS — I10 Essential (primary) hypertension: Secondary | ICD-10-CM

## 2023-11-10 DIAGNOSIS — E1165 Type 2 diabetes mellitus with hyperglycemia: Secondary | ICD-10-CM

## 2023-11-10 DIAGNOSIS — E669 Obesity, unspecified: Secondary | ICD-10-CM

## 2023-11-10 MED ORDER — TIRZEPATIDE 12.5 MG/0.5ML ~~LOC~~ SOAJ: 12.5000 mg | SUBCUTANEOUS | 1 refills | Status: DC

## 2023-11-10 NOTE — Progress Notes (Signed)
 11/10/2023 Name: Sandra [name] MRN: [id number] DOB: 1960-08-04  Chief Complaint  Patient presents with   Medication Management   Diabetes   Hypertension   Hyperlipidemia    Sandra [name] is a 63 y.o. year old female she was referred to the pharmacist by their PCP for assistance in managing medication access.   Spoke with Sandra [name] by phone today.  Subjective:  Patient reports today that she will have surgery on her spine in October 2025.  She has been instructed to hold all antiinflammatory medications 1 week prior to surgery.   Type 2 DM  and obesity  Diagnosed with type 2 DM 10/2022.  Current therapy: Mounjaro  12.5mg  weekly - has 2 syringes left Initially started Mounjaro  2.5mg  11/01/2022.   Previous medications tried:  metformin - caused diarrhea.   Patient has rheumatoid arthritis and reports she has steroid injection about every 3 months in feet or hip. She also will sometimes need systemic /oral prednisone  for burst therapy when RA is not well controlled.  Starting weight = 206lbs; Recent weight = 160 lbs (156 lbs at home today) Has lost about 46 lbs (Mounjaro  start date 11/01/2022) Starting BMI = 34.4; Current BMI = 26.66 Height = 5' 5  Wt Readings from Last 3 Encounters:  09/24/23 160 lb 3.2 oz (72.7 kg)  08/26/23 167 lb (75.8 kg)  07/30/23 165 lb (74.8 kg)    She is using One Touch Verio to check her blood glucose daily.  Recent home blood glucose range 90 to 120  Diet: Continues to follow keto type diet. 1500 calories or less per day.  Usually eats 2 meals per day - 11am and 6pm. If she snacks between meals is usually low sugar yogurt or a few blueberries.  Lunch is tuna salad or green salad; cottage cheese Dinner - meat + veggies; occasionally will have a small portion of low carb pasta or rice.    Hyperlipidemia:  Current therapy: atorvastatin  80mg  daily, ezetimibe  10mg  daily and fenofibrate  134mg  daily  LDL is not at goal but had  improved 147 >> 131 >> 117 >> 101. It was recommended she consider referral to lipid clinic but patient declined/    Hypertension:  Current therapy: irbesartan  150mg  daily and furosemide  20mg  daily   She is not checking blood pressure at home.  BP Readings from Last 3 Encounters:  09/24/23 130/86  07/30/23 (!) 154/97  06/15/23 (!) 151/95    Osteopenia:  Currently taking Relast once per year and vitamin D  5000 units daily Sees Dr [place] - rheumatologist.   Recent DEXA at [facility] 08/12/202 Renaldo Ditch, MD - 10/20/2023   INDICATION: Age-related osteoporosis without current pathological fracture  TECHNIQUE:  Standard bone densitometry was performed.  FINDINGS:      LUMBAR SPINE:  The bone mineral density of the lumbar spine from L1-L2 is 0.913 g/cm2.  T-score: -2.1  Z-score: -1.1  Additional comments/Omitted levels: L3 and L4  Comparison: Previous T score at L1-L2 was -1.6.   PROXIMAL FEMUR:  The bone mineral density as measured at the right femoral neck region of interest is 0.779 g/cm2.  T-score: -1.9  Z-score: -0.8  Comparison: Previous right femoral neck score was -1.5.   Additional sites: None   IMPRESSION:   Osteopenia/low bone mass.  Fracture Risk:  Not calculated  FRAX was not performed.  Treatment Recommendations: FRAX is not available.  Treatment per clinical discretion.  Vitamin D  and calcium  DO NOT constitute treatment for  osteopenia and osteoporosis.   NOTE: Hip or vertebral (clinical or morphometric) fractures may be an indication of osteopenia.  Refer to NOF recommendations.    Follow-up recommendations: Repeat bone densitometry in 2 years.   Previous DEXA at [facility] [place] from 09/09/2021 ASSESSMENT: The BMD measured at Forearm Radius 33% is 0.741 g/cm2 with a T-score of -1.5. This patient is considered osteopenic according to World Health Organization [facility]) criteria. Compared with the prior study on, 08/12/2019 the BMD  of the total mean shows no statistically significant change. The scan quality is good.   Site Region Measured Date Measured Age WHO YA BMD Classification T-score AP Spine L1-L4 09/09/2021 59.6 Low Bone Mass -1.1 1.042 g/cm2 AP Spine L1-L4 08/12/2019 57.5 Low Bone Mass -1.2 1.033 g/cm2   DualFemur Total Mean 09/09/2021 59.6 Normal -0.6 0.937 g/cm2 DualFemur Total Mean 08/12/2019 57.5 Normal -0.5 0.939 g/cm2   Left Forearm Radius 33% 09/09/2021 59.6 Low Bone Mass -1.5 0.741 g/cm2 Left Forearm Radius 33% 08/12/2019 57.5 Low Bone Mass -1.4 0.751 g/cm2  Current Pharmacy:  [id number] - [place], [place] - [address] AT [facility] [address] [place] [address] Phone: [phone] Fax: [phone]    Objective:  Lab Results  Component Value Date   HGBA1C 6.0 05/12/2023    Lab Results  Component Value Date   CREATININE 0.68 09/24/2023   BUN 17 09/24/2023   NA 138 09/24/2023   K 4.6 09/24/2023   CL 102 09/24/2023   CO2 29 09/24/2023    Lab Results  Component Value Date   CHOL 193 05/12/2023   HDL 63.90 05/12/2023   LDLCALC 101 (H) 05/12/2023   LDLDIRECT 179.0 09/03/2021   TRIG 137.0 05/12/2023   CHOLHDL 3 05/12/2023    Medications Reviewed Today     Reviewed by Carla Milling, RPH-CPP (Pharmacist) on 11/10/23 at 1050  Med List Status: <None>   Medication Order Taking? Sig Documenting Provider Last Dose Status Informant  ALPRAZolam  (XANAX ) 0.25 MG tablet [id number] Yes Take 1 tablet (0.25 mg total) by mouth daily as needed. Domenica Harlene LABOR, MD  Active   aspirin 325 MG tablet [id number] Yes Take 325 mg by mouth daily. [provider]  Active   atorvastatin  (LIPITOR) 80 MG tablet [id number] Yes TAKE 1 TABLET BY MOUTH EVERY DAY Domenica Harlene LABOR, MD  Active   Blood Glucose Monitoring Suppl (ONETOUCH VERIO FLEX SYSTEM) w/Device KIT [id number] Yes Use to check blood glucose 1 to 2 times per day Domenica Harlene LABOR, MD  Active    Cholecalciferol (VITAMIN D3) 125 MCG (5000 UT) CAPS [id number] Yes Take by mouth 2 (two) times a week. [provider]  Active   cyclobenzaprine (FLEXERIL) 10 MG tablet [id number]  Take 10 mg by mouth. [provider]  Active   escitalopram  (LEXAPRO ) 10 MG tablet [id number] Yes Take 1 tablet (10 mg total) by mouth daily. Domenica Harlene LABOR, MD  Active   ezetimibe  (ZETIA ) 10 MG tablet [id number] Yes Take 1 tablet (10 mg total) by mouth daily. Domenica Harlene LABOR, MD  Active   fenofibrate  micronized (LOFIBRA) 134 MG capsule [id number] Yes TAKE 1 CAPSULE BY MOUTH DAILY BEFORE BREAKFAST. Domenica Harlene LABOR, MD  Active   furosemide  (LASIX ) 20 MG tablet [id number] Yes Take 1 tablet (20 mg total) by mouth daily. Domenica Harlene LABOR, MD  Active   glucose blood [facility] VERIO) test strip [id number] Yes Use to check blood glucose 1 to 2 times per day  Domenica Harlene LABOR, MD  Active   hydrocortisone-pramoxine [facility]) 2.5-1 % rectal cream [id number]  as needed. [provider]  Active   irbesartan  (AVAPRO ) 150 MG tablet [id number] Yes Take 1 tablet (150 mg total) by mouth daily. Domenica Harlene LABOR, MD  Active   lactobacillus acidophilus (BACID) TABS tablet [id number]  Take 2 tablets by mouth 3 (three) times daily. [provider]  Active   leflunomide  (ARAVA ) 20 MG tablet [id number] Yes Take 1 tablet (20 mg total) by mouth daily. Cheryl Waddell HERO, PA-C  Active   levothyroxine  (SYNTHROID ) 75 MCG tablet [id number] Yes TAKE 1 TABLET BY MOUTH EVERY DAY BEFORE BREAKFAST Domenica Harlene LABOR, MD  Active   meloxicam  (MOBIC ) 7.5 MG tablet [id number] Yes Take 7.5 mg by mouth daily as needed.  Patient taking differently: Take 7.5 mg by mouth in the morning and at bedtime.   [provider]  Active   metoprolol  succinate (TOPROL -XL) 25 MG 24 hr tablet [id number] Yes Take 1 tablet (25 mg total) by mouth daily. TAKE WITH OR IMMEDIATELY FOLLOWING A MEAL. Domenica Harlene LABOR, MD  Active   OMEGA-3 FATTY ACIDS PO  [id number] Yes Take by mouth. [provider]  Active   omeprazole  (PRILOSEC) 40 MG capsule [id number]  TAKE 1 CAPSULE (40 MG TOTAL) BY MOUTH DAILY. Domenica Harlene LABOR, MD  Active   ondansetron  (ZOFRAN -ODT) 4 MG disintegrating tablet [id number] Yes Take 4 mg by mouth as needed. [provider]  Active   OneTouch Delica Lancets 33G MISC [id number]  Use to check blood glucose 1 to 2 times per day Domenica Harlene LABOR, MD  Active   TART CHERRY PO [id number]  Take by mouth. [provider]  Active   tirzepatide  (MOUNJARO ) 12.5 MG/0.5ML Pen [id number] Yes Inject 12.5 mg into the skin once a week. Domenica Harlene LABOR, MD  Active   Trospium Chloride 60 MG CP24 [id number] Yes trospium ER 60 mg capsule,extended release 24 hr  TAKE 1 CAPSULE BY MOUTH EVERY DAY [provider]  Active   TURMERIC PO [id number] Yes Take 1 tablet by mouth 2 (two) times daily. [provider]  Active   Upadacitinib  ER (RINVOQ ) 15 MG TB24 [id number] Yes Take 1 tablet (15 mg total) by mouth daily. Dolphus Reiter, MD  Active   verapamil  (CALAN -SR) 120 MG CR tablet [id number] Yes Take 1 tablet (120 mg total) by mouth at bedtime. Domenica Harlene LABOR, MD  Active   zoledronic  acid (RECLAST ) 5 MG/100ML SOLN injection [id number] Yes Inject 5 mg into the vein. Last dose 06/15/2023 [provider]  Active               Assessment/Plan:   Diabetes / obesity: Tolerating Mounjaro  well. Has lost 39 lbs. A1c and home blood glucose have improved.  - Continue Mounjaro  to 12.5mg  mg weekly.  Reminded patient to hold 1 week prior to and week of her upcoming surgery. - Continue with current diet changes.  - Reviewed home blood glucose readings and reviewed goals. Continue to check blood glucose at home 1 to 2 times per day. Fasting blood glucose goal (before meals) = 80 to 130 Blood glucose goal after a meal = less than 180   Hypertension - last blood pressure improved. Goal blood pressure < 130/80 - Continue  current blood pressure medications.  Hyperlipidemia: Not at LDL goal; TG is now at goal  - Continue atorvastatin  80mg  daily, ezetimibe  10mg  daily and fenofibrate  134mg  daily.  -  If LDL still >  100 with next lipid check, could consider adding either Nexletol / nexlizet or PCSK9 agent.   Osteopenia with high fracture risk due to RA - Reviewed recent and previous DEXA results - T-Score have decreased but not at osteoporosis level.  - Sent patient information about calcium  intake.  - She will discuss DEXA results with rheumatologist and determine if needs to continue or change Reclast .  - Continue vitamin D  therapy.  Follow up with Clinical Pharmacist Practitioner in 8 weeksto 12 weeks  Madelin Ray, PharmD Clinical Pharmacist [facility]

## 2023-11-17 DIAGNOSIS — Z421 Encounter for breast reconstruction following mastectomy: Secondary | ICD-10-CM | POA: Diagnosis not present

## 2023-11-19 ENCOUNTER — Other Ambulatory Visit: Payer: Self-pay

## 2023-11-19 ENCOUNTER — Other Ambulatory Visit: Payer: Self-pay | Admitting: Rheumatology

## 2023-11-19 ENCOUNTER — Telehealth: Payer: Self-pay

## 2023-11-19 NOTE — Telephone Encounter (Signed)
 Patient advised she is scheduled for L3-L5 laminectomy and posterior spinal instrumentation fusion with transforaminal lumbar interbody fusion on 12/09/2023.  Patient states she will begin to hold arava  and rinvoq  on 11/25/2023.   She is scheduled to see Waddell for a routine office visit on 12/08/2023.

## 2023-11-19 NOTE — Telephone Encounter (Signed)
 Patient advised Patient needs to hold Arava  2 weeks prior to the surgery and Rinvoq  for 1 week prior to surgery and may resume both medications 2 weeks after the surgery if there is no infection and she gets clearance by her surgeon. Patient verbalized understanding.

## 2023-11-19 NOTE — Telephone Encounter (Signed)
 Patient needs to hold Arava  2 weeks prior to the surgery and Rinvoq  for 1 week prior to surgery and may resume both medications 2 weeks after the surgery if there is no infection and she gets clearance by her surgeon.

## 2023-11-19 NOTE — Telephone Encounter (Addendum)
 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    Spoke with the patient and she does not need this refilled at this time due to a scheduled surgery.

## 2023-11-24 NOTE — Progress Notes (Unsigned)
 Office Visit Note  Patient: Sandra [name]             Date of Birth: Dec 06, 1960           MRN: [id number]             PCP: Domenica Harlene DELENA, MD Referring: Domenica Harlene DELENA, MD Visit Date: 12/08/2023 Occupation: Data Unavailable  Subjective:  Medication monitoring   History of Present Illness: Sandra [name] is a 63 y.o. female with history of rheumatoid arthritis.  She is prescribed rinvoq  15 mg 1 tablet by mouth daily and arava  20 mg daily.  Patient states that she is scheduled for lumbar surgical intervention tomorrow which will be performed by Dr. Daune.  Patient states that she has been holding both Rinvoq  and Arava  for the past 2 weeks and plans on holding both medications 2 weeks postoperatively.  She has noticed an increase stiffness in both hands and both feet but she denies any signs or symptoms of a rheumatoid arthritis flare recently. Patient reports that she had her right knee replaced on 08/04/2023.  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  She is using a wedge provided by Dr. Janit as well as wearing orthotics.  She has been using either a walker or cane to assist with ambulation.    Activities of Daily Living:  Patient reports morning stiffness for 2 hours.   Patient Reports nocturnal pain.  Difficulty dressing/grooming: Denies Difficulty climbing stairs: Denies Difficulty getting out of chair: Denies Difficulty using hands for taps, buttons, cutlery, and/or writing: Reports  Review of Systems  Constitutional:  Positive for fatigue.  HENT:  Positive for mouth dryness. Negative for mouth sores.   Eyes:  Positive for dryness.  Respiratory:  Positive for shortness of breath.   Cardiovascular:  Negative for chest pain and palpitations.  Gastrointestinal:  Negative for blood in stool, constipation and diarrhea.  Endocrine: Negative for increased urination.  Genitourinary:  Positive for involuntary  urination.  Musculoskeletal:  Positive for joint pain, gait problem, joint pain, muscle weakness, morning stiffness and muscle tenderness. Negative for joint swelling, myalgias and myalgias.  Skin:  Negative for color change, rash, hair loss and sensitivity to sunlight.  Allergic/Immunologic: Negative for susceptible to infections.  Neurological:  Negative for dizziness and headaches.  Hematological:  Negative for swollen glands.  Psychiatric/Behavioral:  Positive for sleep disturbance. Negative for depressed mood. The patient is not nervous/anxious.     PMFS History:  Patient Active Problem List   Diagnosis Date Noted   Low back pain 09/24/2023   Nodule of soft tissue 10/06/2022   Osteoporosis 06/04/2022   Hashimoto's thyroiditis 04/08/2022   Bronchitis 01/09/2022   H/O mastectomy, bilateral 11/29/2021   Malignant neoplasm of right breast in female, estrogen receptor positive (HCC) 10/14/2021   Insomnia 09/04/2021   Osteopenia 09/04/2021   Pulmonary nodule 03/29/2021   Rheumatoid arthritis (HCC) 12/02/2019   Anxiety and depression 08/01/2019   Arthralgia 05/04/2019   OSA (obstructive sleep apnea) 05/03/2019   Thyroid  disease 12/20/2018   Muscle cramp 12/20/2018   Eustachian tube dysfunction 07/27/2017   Headache 11/25/2015   Acute bacterial sinusitis 03/26/2015   Hyperglycemia 04/06/2014   Hyperparathyroidism, primary 07/12/2013   Hypercalcemia 12/16/2012   H/O tobacco use, presenting hazards to health 11/29/2012   Overactive bladder 05/31/2012   Elevated LFTs 05/31/2012   Obesity (BMI 30-39.9) 05/31/2012   Allergic rhinitis 05/31/2012   Preventative health  care 02/04/2012   GERD (gastroesophageal reflux disease)    Rotator cuff syndrome of right shoulder 06/11/2011   Hypertension 01/03/2011   Hyperlipidemia 01/03/2011   KNEE PAIN, BILATERAL 09/28/2008   ANSERINE BURSITIS 09/28/2008   CAVUS DEFORMITY OF FOOT, ACQUIRED 09/28/2008    Past Medical History:  Diagnosis  Date   Allergic rhinitis 05/31/2012   Breast cancer (HCC)    right breast, dx 10/2021.   Chest pain    PT SAW CARDIOLOGIST DR. WONDA - STRESS ECHO DONE NEGATIVE - NO PROBLEM SINCE   Chicken pox as achild   Cough 11/29/2012   RESOLVED - IT WAS CAUSED BY LISINOPRIL   COVID-19    11/2022   Elevated LFTs 05/31/2012   Eustachian tube dysfunction 07/27/2017   GERD (gastroesophageal reflux disease)    H/O tobacco use, presenting hazards to health 11/29/2012   Smoked 1 1/2 ppd for 20 years quit in roughtly 2007   Headache 11/25/2015   Hematuria 04/09/2014   Hoarseness 02/04/2012   RESOLVED - THOUGHT TO HAVE BEEN ALLERY RELATED   Hyperglycemia 04/06/2014   Hyperlipidemia    Hyperparathyroidism    Hypertension    Obesity, unspecified 05/31/2012   Ocular migraine    Overactive bladder 05/31/2012   Pinched nerve 07/23/2023   Right hip   Preventative health care 02/04/2012   PTSD (post-traumatic stress disorder)    Rapid heart beat    PT FEELS PROB RELATED TO HYPER PARATHYROIDISM   Rheumatoid arthritis (HCC)    SUI (stress urinary incontinence, female) 02/04/2012   Sees Dr Scottie and they have discussed a bladder tack but so far she declines   Walking pneumonia 03/17/2023    Family History  Problem Relation Age of Onset   Hypertension Mother 6       alive   Renal cancer Mother        renal   Hyperlipidemia Mother    Osteoporosis Mother        humerus    Macular degeneration Mother    Tremor Mother    Parkinson's disease Mother    Hypertension Father 40       alive   Atrial fibrillation Father    Colon cancer Father        colon   Hyperlipidemia Father    Stroke Father    Alzheimer's disease Father    Hypertension Sister    Hypertension Brother 27       alive   Tremor Brother    Asthma Son    Eczema Son    Healthy Son    Heart disease Paternal Aunt    Heart disease Paternal Uncle    Lung cancer Maternal Grandfather        lung/ smoker   Emphysema Maternal  Grandfather    Heart disease Paternal Grandmother    Other Paternal Grandfather        black lung   Heart disease Paternal Grandfather    Past Surgical History:  Procedure Laterality Date   BREAST EXCISIONAL BIOPSY Left    Age 36 Fibroadenoma   BREAST SURGERY     left breast excision of adenoma   btl     COLONOSCOPY  07/2020   FOOT SURGERY     MASTECTOMY Bilateral 11/28/2021   PARATHYROIDECTOMY Right 08/12/2013   Procedure: right inferior frozen section PARATHYROIDECTOMY;  Surgeon: Krystal CHRISTELLA Spinner, MD;  Location: [facility];  Service: General;  Laterality: Right;   PELVIC LAPAROSCOPY  03/2004   PLACEMENT OF BREAST  IMPLANTS  01/15/2023   History of breast cancer   REPLACEMENT TOTAL KNEE Right 08/04/2023   TISSUE EXPANDER PLACEMENT Bilateral 08/14/2022   at [facility] REPLACEMENT Right 01/2019   TUBAL LIGATION     WISDOM TOOTH EXTRACTION  30 yrs ago   Social History   Tobacco Use   Smoking status: Former    Current packs/day: 0.00    Average packs/day: 1 pack/day for 20.0 years (20.0 ttl pk-yrs)    Types: Cigarettes    Start date: 03/10/1985    Quit date: 03/10/2005    Years since quitting: 18.7    Passive exposure: Never   Smokeless tobacco: Never  Vaping Use   Vaping status: Never Used  Substance Use Topics   Alcohol use: Not Currently   Drug use: No   Social History   Social History Narrative   Not on file     Immunization History  Administered Date(s) Administered   Hepatitis A, Adult 09/04/2016, 03/27/2017   Influenza Split 12/09/2011   Influenza, Seasonal, Injecte, Preservative Fre 02/10/2023   Influenza,inj,Quad PF,6+ Mos 11/29/2012, 11/01/2013, 11/20/2015, 12/29/2017, 04/03/2019, 12/13/2019, 03/28/2021   PFIZER(Purple Top)SARS-COV-2 Vaccination 05/20/2019, 06/15/2019, 01/11/2020   Pneumococcal Conjugate-13 03/28/2021   Pneumococcal Polysaccharide-23 08/01/2019   Tdap 06/07/2015   Zoster Recombinant(Shingrix) 08/01/2019, 10/03/2020      Objective: Vital Signs: BP 133/84   Pulse 96   Temp 98.1 F (36.7 C)   Resp 14   Ht 5' 5 (1.651 m)   Wt 156 lb 6.4 oz (70.9 kg)   LMP 04/01/2011   BMI 26.03 kg/m    Physical Exam Vitals and nursing note reviewed.  Constitutional:      Appearance: She is well-developed.  HENT:     Head: Normocephalic and atraumatic.  Eyes:     Conjunctiva/sclera: Conjunctivae normal.  Cardiovascular:     Rate and Rhythm: Normal rate and regular rhythm.     Heart sounds: Normal heart sounds.  Pulmonary:     Effort: Pulmonary effort is normal.     Breath sounds: Normal breath sounds.  Abdominal:     General: Bowel sounds are normal.     Palpations: Abdomen is soft.  Musculoskeletal:     Cervical back: Normal range of motion.  Lymphadenopathy:     Cervical: No cervical adenopathy.  Skin:    General: Skin is warm and dry.     Capillary Refill: Capillary refill takes less than 2 seconds.  Neurological:     Mental Status: She is alert and oriented to person, place, and time.  Psychiatric:        Behavior: Behavior normal.      Musculoskeletal Exam: Patient remained seated during examination today.  Right shoulder placement has good range of motion.  Left shoulder joint has good range of motion.  Elbow joints, wrist joints, MCPs, PIPs, DIPs have good range of motion with no synovitis.  Complete fist formation bilaterally.  PIP and DIP thickening consistent with osteoarthritis of both hands.  Hip joints are good assessed in seated position.  Right knee replacement has good range of motion.  Left knee joint has good range of motion no warmth or effusion.  Ankle joints have good range of motion with no tenderness or joint swelling.   CDAI Exam: CDAI Score: -- Patient Global: --; Provider Global: -- Swollen: --; Tender: -- Joint Exam 12/08/2023   No joint exam has been documented for this visit   There is currently no information documented on  the homunculus. Go to the Rheumatology  activity and complete the homunculus joint exam.  Investigation: No additional findings.  Imaging: No results found.  Recent Labs: Lab Results  Component Value Date   WBC 3.3 11/26/2023   HGB 11.1 (A) 11/26/2023   PLT 366 11/26/2023   NA 140 11/26/2023   K 4.5 11/26/2023   CL 105 11/26/2023   CO2 25 (A) 11/26/2023   GLUCOSE 79 09/24/2023   BUN 13 11/26/2023   CREATININE 0.6 11/26/2023   BILITOT 0.3 09/24/2023   ALKPHOS 34 11/26/2023   AST 28 11/26/2023   ALT 19 11/26/2023   PROT 6.9 09/24/2023   ALBUMIN 4.4 11/26/2023   CALCIUM  9.8 11/26/2023   GFRAA 117 08/31/2020   QFTBGOLDPLUS NEGATIVE 07/30/2023    Speciality Comments: Orencia  stoped 10/04/20 Rinvoq  started 10/11/20  Procedures:  No procedures performed Allergies: Cefdinir, Codeine, Doxycycline , Penicillins, Metformin and related, Other, and Simvastatin   Dr. 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  She has not had any signs or symptoms of a rheumatoid arthritis flare.  She has clinically been doing well taking Rinvoq  15 mg 1 tablet by mouth daily and Arava  20 mg 1 tablet daily.  She is currently holding both medications in anticipation of having a lumbar fusion performed tomorrow by Dr. Daune.  Patient is aware that she will hold both medications for 2 weeks after surgery and will require clearance by her surgeon prior to resuming these medications.   She has noticed an increase stiffness in her hands and feet while off of rinvoq  and arava  but has not had a flare. No medication changes will be made.  She is vies notify us  if she develops any signs or symptoms of a flare.  She will follow-up in the office in 5 months or sooner if needed.  High risk medication use - Currently holding Rinvoq  15 mg daily and Arava  20 mg daily--scheduled for fusion tomorrow.   Rinvoq   initially July 2022-gaps in therapy breast cancer dx/ mastectomy/recurrent seromas/reconstructive surgery. CBC, BMP, and hepatic function panel updated on 11/26/23.  Her next lab work will be due in mid December and every 3 months to monitor for drug toxicity. Hgb A1c updated on 11/26/23 Lipid panel 05/12/23 TB gold negative 07/30/23.  No recent or recurrent infections.  Discussed the importance of holding rinvoq  and arava  if she develops signs or symptoms of an infection and to resume once the infection has completely cleared.    Spondylolisthesis, lumbar region: Chronic pain.  Using cane or walker to assist with ambulation. Scheduled for fusion tomorrow-Dr. Daune.  She has been holding Rinvoq  and Arava  for the past 2 weeks and plans on continuing to hold both medications for 2 weeks postoperatively.  Post-menopausal osteoporosis:  DEXA updated on 09/09/21: The BMD measured at Forearm Radius 33% is 0.741 g/cm2 with a T-score of -1.5. Previous DEXA on 08/12/19:Radius 33% is 0.751 g/cm2 with a T-score of -1.4. Bilateral sacral alar fracture after stepping in a hole in January 2024.   History of left hip fracture-April 2024.  History of recurrent prednisone  tapers and frequent cortisone injections. History of hyperparathyroidism--history of right parathyroidectomy 2015. History of Hashimoto's thyroiditis--remains on Synthroid . Previous smoker. History of GERD--not a good candidate for oral bisphosphonate therapy.  History of breast cancer--plan avoiding Tymlos/Forteo.  Plan to avoid Evenity given risk for  major cardiovascular events while on Rinvoq . IV reclast  administered 06/09/22 and again on 06/15/23.   She is taking vitamin D  5,000 twice weekly. She is taking a daily calcium  supplement.  Discussed the importance of trying to avoid corticosteroid use. DEXA updated on 10/20/23: lumbar spine from L1-L2 is 0.913 g/cm2. T-score: -2.1  Right femoral neck region of interest is 0.779 g/cm2. T-score: -1.9.     Vitamin D  deficiency: She is taking vitamin D  5000 units twice weekly.  Status post total shoulder replacement, right: Doing well. Good ROM with no discomfort.   Primary osteoarthritis of both hands: She has PIP and DIP thickening consistent with osteoarthritis of both hands. She continues to experience stiffness in both hands but no active synovitis noted.   S/P total knee arthroplasty, right: Performed on 08/04/2023. Doing well.  Leg length inequality.    Primary osteoarthritis of both feet: Under the care of Dr. Janit.  She has not required cortisone injections in her feet since being fitted for orthotics.   History of breast cancer: 10/02/21 suspicious calcifications right breast-Bilateral mastectomy, right SLNBx, Dr Margarito. 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 PTispN0. ER 60%, PR negative.  Right breast with DCIS, all margins free of tumor, adequate margins and 0/5 lymph nodes positive for carcinoma. Left breast with no malignancy.  Recurrent seroma.   Completed implant and reconstruction with Dr. Merita.   No upcoming surgical procedures scheduled  Pes cavus of both feet: Using orthotics prescribed by Dr. Janit.  Other medical conditions are listed as follows:  Anxiety and depression  Hyperparathyroidism, primary  History of gastroesophageal reflux (GERD)  Essential hypertension: Blood pressure was 133/84 today in the office.  Hashimoto's thyroiditis  History of miscarriage  History of hyperlipidemia  Hx of migraines  Former smoker  Prediabetes  Orders: No orders of the defined types were placed in this encounter.  No orders of the defined types were placed in this encounter.    Follow-Up Instructions: Return in about 5 months (around 05/07/2024) for Rheumatoid arthritis.   Waddell CHRISTELLA Craze, PA-C  Note - This record has been created using Dragon software.  Chart creation errors have been sought, but may  not always  have been located. Such creation errors do not reflect on  the standard of medical care.

## 2023-11-25 DIAGNOSIS — E119 Type 2 diabetes mellitus without complications: Secondary | ICD-10-CM | POA: Diagnosis not present

## 2023-11-25 DIAGNOSIS — Z01818 Encounter for other preprocedural examination: Secondary | ICD-10-CM | POA: Diagnosis not present

## 2023-11-25 DIAGNOSIS — I10 Essential (primary) hypertension: Secondary | ICD-10-CM | POA: Diagnosis not present

## 2023-11-25 DIAGNOSIS — Z79899 Other long term (current) drug therapy: Secondary | ICD-10-CM | POA: Diagnosis not present

## 2023-11-25 DIAGNOSIS — M4726 Other spondylosis with radiculopathy, lumbar region: Secondary | ICD-10-CM | POA: Diagnosis not present

## 2023-11-26 LAB — BASIC METABOLIC PANEL WITH GFR
BUN: 13 (ref 4–21)
CO2: 25 — AB (ref 13–22)
Chloride: 105 (ref 99–108)
Creatinine: 0.6 (ref 0.5–1.1)
Glucose: 80
Potassium: 4.5 meq/L (ref 3.5–5.1)
Sodium: 140 (ref 137–147)

## 2023-11-26 LAB — HEMOGLOBIN A1C: Hemoglobin A1C: 5.2

## 2023-11-26 LAB — CBC AND DIFFERENTIAL
HCT: 34 — AB (ref 36–46)
Hemoglobin: 11.1 — AB (ref 12.0–16.0)
Platelets: 366 K/uL (ref 150–400)
WBC: 3.3

## 2023-11-26 LAB — HEPATIC FUNCTION PANEL
ALT: 19 U/L (ref 7–35)
AST: 28 (ref 13–35)
Alkaline Phosphatase: 34 (ref 25–125)
Bilirubin, Total: 0.3

## 2023-11-26 LAB — IRON,TIBC AND FERRITIN PANEL
%SAT: 23
Ferritin: 221
Iron: 109.9
UIBC: 370

## 2023-11-26 LAB — COMPREHENSIVE METABOLIC PANEL WITH GFR
Albumin: 4.4 (ref 3.5–5.0)
Calcium: 9.8 (ref 8.7–10.7)
Globulin: 2.1
eGFR: 102

## 2023-11-26 LAB — CBC: RBC: 3.74 — AB (ref 3.87–5.11)

## 2023-12-02 ENCOUNTER — Other Ambulatory Visit: Payer: Self-pay | Admitting: Family Medicine

## 2023-12-02 DIAGNOSIS — Z01419 Encounter for gynecological examination (general) (routine) without abnormal findings: Secondary | ICD-10-CM | POA: Diagnosis not present

## 2023-12-02 DIAGNOSIS — F419 Anxiety disorder, unspecified: Secondary | ICD-10-CM

## 2023-12-02 DIAGNOSIS — N3281 Overactive bladder: Secondary | ICD-10-CM | POA: Diagnosis not present

## 2023-12-03 ENCOUNTER — Other Ambulatory Visit: Payer: Self-pay | Admitting: Physician Assistant

## 2023-12-03 NOTE — Telephone Encounter (Signed)
 Last Fill: 07/30/2023  Labs: 11/26/2023 CO2 25, RBC 3.74, Hgb 11.1, Hct 34  Next Visit: 12/08/2023  Last Visit: 07/30/2023  DX: Rheumatoid arthritis of multiple sites with negative rheumatoid factor   Current Dose per office note 07/30/2023: Arava  20 mg daily.   Okay to refill Arava  ?

## 2023-12-04 ENCOUNTER — Encounter: Payer: Self-pay | Admitting: Rheumatology

## 2023-12-04 ENCOUNTER — Other Ambulatory Visit: Payer: Self-pay | Admitting: Rheumatology

## 2023-12-04 ENCOUNTER — Other Ambulatory Visit (HOSPITAL_COMMUNITY): Payer: Self-pay

## 2023-12-04 ENCOUNTER — Other Ambulatory Visit: Payer: Self-pay

## 2023-12-04 MED ORDER — RINVOQ 15 MG PO TB24
15.0000 mg | ORAL_TABLET | Freq: Every day | ORAL | 2 refills | Status: DC
  Filled 2023-12-04: qty 30, 30d supply, fill #0
  Filled 2024-01-07 – 2024-01-08 (×2): qty 30, 30d supply, fill #1
  Filled 2024-02-03 – 2024-02-09 (×2): qty 30, 30d supply, fill #2

## 2023-12-04 NOTE — Telephone Encounter (Signed)
 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   Okay to refill Rinvoq ?

## 2023-12-04 NOTE — Progress Notes (Signed)
 Specialty Pharmacy Refill Coordination Note  Sandra [name] is a 63 y.o. female contacted today regarding refills of specialty medication(s) Upadacitinib  (Rinvoq )   Patient requested Delivery   Delivery date: 12/21/23   Verified address: [address]   [place] [address]   Medication will be filled on 12/18/23. This fill date is pending response to refill request from provider. Patient is aware and if they have not received fill by intended date they must follow up with pharmacy.

## 2023-12-08 ENCOUNTER — Ambulatory Visit: Attending: Physician Assistant | Admitting: Physician Assistant

## 2023-12-08 ENCOUNTER — Encounter: Payer: Self-pay | Admitting: Physician Assistant

## 2023-12-08 VITALS — BP 133/84 | HR 96 | Temp 98.1°F | Resp 14 | Ht 65.0 in | Wt 156.4 lb

## 2023-12-08 DIAGNOSIS — Z96651 Presence of right artificial knee joint: Secondary | ICD-10-CM

## 2023-12-08 DIAGNOSIS — Z79899 Other long term (current) drug therapy: Secondary | ICD-10-CM | POA: Diagnosis not present

## 2023-12-08 DIAGNOSIS — I10 Essential (primary) hypertension: Secondary | ICD-10-CM

## 2023-12-08 DIAGNOSIS — M19072 Primary osteoarthritis, left ankle and foot: Secondary | ICD-10-CM

## 2023-12-08 DIAGNOSIS — M19041 Primary osteoarthritis, right hand: Secondary | ICD-10-CM

## 2023-12-08 DIAGNOSIS — Z8669 Personal history of other diseases of the nervous system and sense organs: Secondary | ICD-10-CM

## 2023-12-08 DIAGNOSIS — Q6671 Congenital pes cavus, right foot: Secondary | ICD-10-CM

## 2023-12-08 DIAGNOSIS — Z87891 Personal history of nicotine dependence: Secondary | ICD-10-CM

## 2023-12-08 DIAGNOSIS — M4316 Spondylolisthesis, lumbar region: Secondary | ICD-10-CM

## 2023-12-08 DIAGNOSIS — M810 Age-related osteoporosis without current pathological fracture: Secondary | ICD-10-CM

## 2023-12-08 DIAGNOSIS — E559 Vitamin D deficiency, unspecified: Secondary | ICD-10-CM

## 2023-12-08 DIAGNOSIS — Q6672 Congenital pes cavus, left foot: Secondary | ICD-10-CM

## 2023-12-08 DIAGNOSIS — Z8759 Personal history of other complications of pregnancy, childbirth and the puerperium: Secondary | ICD-10-CM

## 2023-12-08 DIAGNOSIS — Z96611 Presence of right artificial shoulder joint: Secondary | ICD-10-CM

## 2023-12-08 DIAGNOSIS — Z853 Personal history of malignant neoplasm of breast: Secondary | ICD-10-CM

## 2023-12-08 DIAGNOSIS — M0609 Rheumatoid arthritis without rheumatoid factor, multiple sites: Secondary | ICD-10-CM | POA: Diagnosis not present

## 2023-12-08 DIAGNOSIS — R7303 Prediabetes: Secondary | ICD-10-CM

## 2023-12-08 DIAGNOSIS — M19042 Primary osteoarthritis, left hand: Secondary | ICD-10-CM

## 2023-12-08 DIAGNOSIS — F419 Anxiety disorder, unspecified: Secondary | ICD-10-CM

## 2023-12-08 DIAGNOSIS — E210 Primary hyperparathyroidism: Secondary | ICD-10-CM

## 2023-12-08 DIAGNOSIS — M1711 Unilateral primary osteoarthritis, right knee: Secondary | ICD-10-CM

## 2023-12-08 DIAGNOSIS — Z8639 Personal history of other endocrine, nutritional and metabolic disease: Secondary | ICD-10-CM

## 2023-12-08 DIAGNOSIS — E063 Autoimmune thyroiditis: Secondary | ICD-10-CM

## 2023-12-08 DIAGNOSIS — M19071 Primary osteoarthritis, right ankle and foot: Secondary | ICD-10-CM

## 2023-12-08 DIAGNOSIS — Z8719 Personal history of other diseases of the digestive system: Secondary | ICD-10-CM

## 2023-12-08 DIAGNOSIS — F32A Depression, unspecified: Secondary | ICD-10-CM

## 2023-12-08 NOTE — Patient Instructions (Signed)
 Standing Labs We placed an order today for your standing lab work.   Please have your standing labs drawn in mid-December and every 3 months   Please have your labs drawn 2 weeks prior to your appointment so that the provider can discuss your lab results at your appointment, if possible.  Please note that you may see your imaging and lab results in MyChart before we have reviewed them. We will contact you once all results are reviewed. Please allow our office up to 72 hours to thoroughly review all of the results before contacting the office for clarification of your results.  WALK-IN LAB HOURS  Monday through Thursday from 8:00 am -12:30 pm and 1:00 pm-4:30 pm and Friday from 8:00 am-12:00 pm.  Patients with office visits requiring labs will be seen before walk-in labs.  You may encounter longer than normal wait times. Please allow additional time. Wait times may be shorter on  Monday and Thursday afternoons.  We do not book appointments for walk-in labs. We appreciate your patience and understanding with our staff.   Labs are drawn by [facility]. Please bring your co-pay at the time of your lab draw.  You may receive a bill from [facility] for your lab work.  Please note if you are on Hydroxychloroquine and and an order has been placed for a Hydroxychloroquine level,  you will need to have it drawn 4 hours or more after your last dose.  If you wish to have your labs drawn at another location, please call the office 24 hours in advance so we can fax the orders.  The office is located at [address], [place], [place] [address]   If you have any questions regarding directions or hours of operation,  please call [phone].   As a reminder, please drink plenty of water prior to coming for your lab work. Thanks!

## 2023-12-09 DIAGNOSIS — M069 Rheumatoid arthritis, unspecified: Secondary | ICD-10-CM | POA: Diagnosis not present

## 2023-12-09 DIAGNOSIS — Z87891 Personal history of nicotine dependence: Secondary | ICD-10-CM | POA: Diagnosis not present

## 2023-12-09 DIAGNOSIS — Z7985 Long-term (current) use of injectable non-insulin antidiabetic drugs: Secondary | ICD-10-CM | POA: Diagnosis not present

## 2023-12-09 DIAGNOSIS — I10 Essential (primary) hypertension: Secondary | ICD-10-CM | POA: Diagnosis not present

## 2023-12-09 DIAGNOSIS — E213 Hyperparathyroidism, unspecified: Secondary | ICD-10-CM | POA: Diagnosis not present

## 2023-12-09 DIAGNOSIS — M4316 Spondylolisthesis, lumbar region: Secondary | ICD-10-CM | POA: Diagnosis not present

## 2023-12-09 DIAGNOSIS — F39 Unspecified mood [affective] disorder: Secondary | ICD-10-CM | POA: Diagnosis not present

## 2023-12-09 DIAGNOSIS — M810 Age-related osteoporosis without current pathological fracture: Secondary | ICD-10-CM | POA: Diagnosis not present

## 2023-12-09 DIAGNOSIS — E785 Hyperlipidemia, unspecified: Secondary | ICD-10-CM | POA: Diagnosis not present

## 2023-12-09 DIAGNOSIS — M5416 Radiculopathy, lumbar region: Secondary | ICD-10-CM | POA: Diagnosis not present

## 2023-12-09 DIAGNOSIS — Z853 Personal history of malignant neoplasm of breast: Secondary | ICD-10-CM | POA: Diagnosis not present

## 2023-12-09 DIAGNOSIS — M48061 Spinal stenosis, lumbar region without neurogenic claudication: Secondary | ICD-10-CM | POA: Diagnosis not present

## 2023-12-09 DIAGNOSIS — Z79890 Hormone replacement therapy (postmenopausal): Secondary | ICD-10-CM | POA: Diagnosis not present

## 2023-12-09 DIAGNOSIS — K219 Gastro-esophageal reflux disease without esophagitis: Secondary | ICD-10-CM | POA: Diagnosis not present

## 2023-12-09 DIAGNOSIS — Z79899 Other long term (current) drug therapy: Secondary | ICD-10-CM | POA: Diagnosis not present

## 2023-12-10 DIAGNOSIS — I10 Essential (primary) hypertension: Secondary | ICD-10-CM | POA: Diagnosis not present

## 2023-12-10 DIAGNOSIS — E213 Hyperparathyroidism, unspecified: Secondary | ICD-10-CM | POA: Diagnosis not present

## 2023-12-10 DIAGNOSIS — E785 Hyperlipidemia, unspecified: Secondary | ICD-10-CM | POA: Diagnosis not present

## 2023-12-10 DIAGNOSIS — Z79890 Hormone replacement therapy (postmenopausal): Secondary | ICD-10-CM | POA: Diagnosis not present

## 2023-12-10 DIAGNOSIS — K219 Gastro-esophageal reflux disease without esophagitis: Secondary | ICD-10-CM | POA: Diagnosis not present

## 2023-12-10 DIAGNOSIS — F39 Unspecified mood [affective] disorder: Secondary | ICD-10-CM | POA: Diagnosis not present

## 2023-12-10 DIAGNOSIS — Z981 Arthrodesis status: Secondary | ICD-10-CM | POA: Diagnosis not present

## 2023-12-10 DIAGNOSIS — M48061 Spinal stenosis, lumbar region without neurogenic claudication: Secondary | ICD-10-CM | POA: Diagnosis not present

## 2023-12-10 DIAGNOSIS — M4316 Spondylolisthesis, lumbar region: Secondary | ICD-10-CM | POA: Diagnosis not present

## 2023-12-10 DIAGNOSIS — M069 Rheumatoid arthritis, unspecified: Secondary | ICD-10-CM | POA: Diagnosis not present

## 2023-12-10 DIAGNOSIS — Z87891 Personal history of nicotine dependence: Secondary | ICD-10-CM | POA: Diagnosis not present

## 2023-12-10 DIAGNOSIS — Z7985 Long-term (current) use of injectable non-insulin antidiabetic drugs: Secondary | ICD-10-CM | POA: Diagnosis not present

## 2023-12-10 DIAGNOSIS — Z853 Personal history of malignant neoplasm of breast: Secondary | ICD-10-CM | POA: Diagnosis not present

## 2023-12-10 DIAGNOSIS — Z79899 Other long term (current) drug therapy: Secondary | ICD-10-CM | POA: Diagnosis not present

## 2023-12-10 DIAGNOSIS — M810 Age-related osteoporosis without current pathological fracture: Secondary | ICD-10-CM | POA: Diagnosis not present

## 2023-12-10 DIAGNOSIS — M5416 Radiculopathy, lumbar region: Secondary | ICD-10-CM | POA: Diagnosis not present

## 2023-12-11 DIAGNOSIS — M4316 Spondylolisthesis, lumbar region: Secondary | ICD-10-CM | POA: Diagnosis not present

## 2023-12-11 DIAGNOSIS — E785 Hyperlipidemia, unspecified: Secondary | ICD-10-CM | POA: Diagnosis not present

## 2023-12-11 DIAGNOSIS — Z79899 Other long term (current) drug therapy: Secondary | ICD-10-CM | POA: Diagnosis not present

## 2023-12-11 DIAGNOSIS — M5416 Radiculopathy, lumbar region: Secondary | ICD-10-CM | POA: Diagnosis not present

## 2023-12-11 DIAGNOSIS — K219 Gastro-esophageal reflux disease without esophagitis: Secondary | ICD-10-CM | POA: Diagnosis not present

## 2023-12-11 DIAGNOSIS — Z79890 Hormone replacement therapy (postmenopausal): Secondary | ICD-10-CM | POA: Diagnosis not present

## 2023-12-11 DIAGNOSIS — Z7985 Long-term (current) use of injectable non-insulin antidiabetic drugs: Secondary | ICD-10-CM | POA: Diagnosis not present

## 2023-12-11 DIAGNOSIS — E213 Hyperparathyroidism, unspecified: Secondary | ICD-10-CM | POA: Diagnosis not present

## 2023-12-11 DIAGNOSIS — M069 Rheumatoid arthritis, unspecified: Secondary | ICD-10-CM | POA: Diagnosis not present

## 2023-12-11 DIAGNOSIS — F39 Unspecified mood [affective] disorder: Secondary | ICD-10-CM | POA: Diagnosis not present

## 2023-12-11 DIAGNOSIS — Z853 Personal history of malignant neoplasm of breast: Secondary | ICD-10-CM | POA: Diagnosis not present

## 2023-12-11 DIAGNOSIS — M810 Age-related osteoporosis without current pathological fracture: Secondary | ICD-10-CM | POA: Diagnosis not present

## 2023-12-11 DIAGNOSIS — I10 Essential (primary) hypertension: Secondary | ICD-10-CM | POA: Diagnosis not present

## 2023-12-11 DIAGNOSIS — M48061 Spinal stenosis, lumbar region without neurogenic claudication: Secondary | ICD-10-CM | POA: Diagnosis not present

## 2023-12-11 DIAGNOSIS — Z87891 Personal history of nicotine dependence: Secondary | ICD-10-CM | POA: Diagnosis not present

## 2023-12-13 DIAGNOSIS — Z96611 Presence of right artificial shoulder joint: Secondary | ICD-10-CM | POA: Diagnosis not present

## 2023-12-13 DIAGNOSIS — Z96651 Presence of right artificial knee joint: Secondary | ICD-10-CM | POA: Diagnosis not present

## 2023-12-13 DIAGNOSIS — Z79899 Other long term (current) drug therapy: Secondary | ICD-10-CM | POA: Diagnosis not present

## 2023-12-13 DIAGNOSIS — Z880 Allergy status to penicillin: Secondary | ICD-10-CM | POA: Diagnosis not present

## 2023-12-13 DIAGNOSIS — Z7985 Long-term (current) use of injectable non-insulin antidiabetic drugs: Secondary | ICD-10-CM | POA: Diagnosis not present

## 2023-12-13 DIAGNOSIS — Z853 Personal history of malignant neoplasm of breast: Secondary | ICD-10-CM | POA: Diagnosis not present

## 2023-12-13 DIAGNOSIS — Z882 Allergy status to sulfonamides status: Secondary | ICD-10-CM | POA: Diagnosis not present

## 2023-12-13 DIAGNOSIS — Z981 Arthrodesis status: Secondary | ICD-10-CM | POA: Diagnosis not present

## 2023-12-13 DIAGNOSIS — M069 Rheumatoid arthritis, unspecified: Secondary | ICD-10-CM | POA: Diagnosis not present

## 2023-12-13 DIAGNOSIS — Z881 Allergy status to other antibiotic agents status: Secondary | ICD-10-CM | POA: Diagnosis not present

## 2023-12-13 DIAGNOSIS — M549 Dorsalgia, unspecified: Secondary | ICD-10-CM | POA: Diagnosis not present

## 2023-12-13 DIAGNOSIS — Z79890 Hormone replacement therapy (postmenopausal): Secondary | ICD-10-CM | POA: Diagnosis not present

## 2023-12-13 DIAGNOSIS — Z888 Allergy status to other drugs, medicaments and biological substances status: Secondary | ICD-10-CM | POA: Diagnosis not present

## 2023-12-13 DIAGNOSIS — M545 Low back pain, unspecified: Secondary | ICD-10-CM | POA: Diagnosis not present

## 2023-12-13 DIAGNOSIS — Z9889 Other specified postprocedural states: Secondary | ICD-10-CM | POA: Diagnosis not present

## 2023-12-13 DIAGNOSIS — K219 Gastro-esophageal reflux disease without esophagitis: Secondary | ICD-10-CM | POA: Diagnosis not present

## 2023-12-13 DIAGNOSIS — S0990XA Unspecified injury of head, initial encounter: Secondary | ICD-10-CM | POA: Diagnosis not present

## 2023-12-13 DIAGNOSIS — Z885 Allergy status to narcotic agent status: Secondary | ICD-10-CM | POA: Diagnosis not present

## 2023-12-13 DIAGNOSIS — R296 Repeated falls: Secondary | ICD-10-CM | POA: Diagnosis not present

## 2023-12-13 DIAGNOSIS — S5002XA Contusion of left elbow, initial encounter: Secondary | ICD-10-CM | POA: Diagnosis not present

## 2023-12-13 DIAGNOSIS — W010XXA Fall on same level from slipping, tripping and stumbling without subsequent striking against object, initial encounter: Secondary | ICD-10-CM | POA: Diagnosis not present

## 2023-12-13 DIAGNOSIS — I10 Essential (primary) hypertension: Secondary | ICD-10-CM | POA: Diagnosis not present

## 2023-12-13 DIAGNOSIS — E119 Type 2 diabetes mellitus without complications: Secondary | ICD-10-CM | POA: Diagnosis not present

## 2023-12-13 DIAGNOSIS — Z87891 Personal history of nicotine dependence: Secondary | ICD-10-CM | POA: Diagnosis not present

## 2023-12-13 DIAGNOSIS — G4730 Sleep apnea, unspecified: Secondary | ICD-10-CM | POA: Diagnosis not present

## 2023-12-13 DIAGNOSIS — Z9013 Acquired absence of bilateral breasts and nipples: Secondary | ICD-10-CM | POA: Diagnosis not present

## 2023-12-13 DIAGNOSIS — M810 Age-related osteoporosis without current pathological fracture: Secondary | ICD-10-CM | POA: Diagnosis not present

## 2023-12-13 DIAGNOSIS — Z8669 Personal history of other diseases of the nervous system and sense organs: Secondary | ICD-10-CM | POA: Diagnosis not present

## 2023-12-16 ENCOUNTER — Other Ambulatory Visit: Payer: Self-pay

## 2023-12-18 ENCOUNTER — Other Ambulatory Visit: Payer: Self-pay

## 2023-12-28 ENCOUNTER — Telehealth: Payer: Self-pay | Admitting: Family Medicine

## 2023-12-28 ENCOUNTER — Other Ambulatory Visit: Payer: Self-pay | Admitting: Family Medicine

## 2023-12-28 ENCOUNTER — Other Ambulatory Visit: Payer: Self-pay

## 2023-12-28 MED ORDER — PREDNISONE 5 MG PO TABS: ORAL_TABLET | ORAL | 0 refills | Status: AC

## 2023-12-28 MED ORDER — TIRZEPATIDE 15 MG/0.5ML ~~LOC~~ SOAJ: 15.0000 mg | SUBCUTANEOUS | 3 refills | Status: AC

## 2023-12-28 NOTE — Telephone Encounter (Signed)
 Please review and sign pended prednisone taper. Thanks!

## 2023-12-28 NOTE — Telephone Encounter (Signed)
 Advised patient that prednisone  taper has been sent in and also advised her to Take prednisone  in the morning with food and avoid the use of NSAIDs. Patient verbalized understanding.

## 2023-12-28 NOTE — Telephone Encounter (Signed)
 Patient called to request a long prednisone  taper for a flare she is currently experiencing in her hands and feet. Patient reports pain in both hands and feet, and swelling in both hands. Patient can make a fist but reports it is very tight. Onset of flare was Friday and she has been taking tylenol  with no relief. Patient had surgery on 12/09/2023 and resumed both arava  and rinvoq  2 weeks after surgery as she has been healing well with no signs of infections.   [facility] [place] on [address].

## 2023-12-28 NOTE — Telephone Encounter (Signed)
Ok to send in prednisone starting at 20 mg tapering by 5 mg every 4 days.  Take prednisone in the morning with food and avoid the use of NSAIDs.

## 2023-12-28 NOTE — Telephone Encounter (Signed)
 Attempted to contact patient to advise her that prednisone  taper has been sent in and also advise her Take prednisone  in the morning with food and avoid the use of NSAIDs but could not leave message because mailbox was full.

## 2023-12-28 NOTE — Telephone Encounter (Unsigned)
 Copied from CRM #[id number]. Topic: Clinical - Medication Refill >> Dec 28, 2023 11:16 AM Carlyon D wrote: Medication: tirzepatide  (MOUNJARO ) 15MG /0.5ML Pen  (Pt is wanting to go up to 15 mg as she is done this week with 12.5 MG)   Has the patient contacted their pharmacy? Yes (Agent: If no, request that the patient contact the pharmacy for the refill. If patient does not wish to contact the pharmacy document the reason why and proceed with request.) (Agent: If yes, when and what did the pharmacy advise?)  This is the patient's preferred pharmacy:  [id number] - [place], [place] - [address] AT [facility] [address] [place] [address] Phone: [phone] Fax: [phone]  Is this the correct pharmacy for this prescription? Yes If no, delete pharmacy and type the correct one.   Has the prescription been filled recently? No  Is the patient out of the medication? No, 1 shot left this week   Has the patient been seen for an appointment in the last year OR does the patient have an upcoming appointment? Yes  Can we respond through MyChart? No, prefers phone call   Agent: Please be advised that Rx refills may take up to 3 business days. We ask that you follow-up with your pharmacy.

## 2023-12-29 DIAGNOSIS — Z4889 Encounter for other specified surgical aftercare: Secondary | ICD-10-CM | POA: Diagnosis not present

## 2023-12-29 NOTE — Telephone Encounter (Signed)
Sent pt message about refill.

## 2023-12-30 ENCOUNTER — Other Ambulatory Visit: Payer: Self-pay | Admitting: Family Medicine

## 2023-12-30 ENCOUNTER — Encounter: Payer: Self-pay | Admitting: Podiatry

## 2023-12-30 ENCOUNTER — Ambulatory Visit (INDEPENDENT_AMBULATORY_CARE_PROVIDER_SITE_OTHER): Admitting: Podiatry

## 2023-12-30 VITALS — Ht 65.0 in | Wt 156.4 lb

## 2023-12-30 DIAGNOSIS — M19071 Primary osteoarthritis, right ankle and foot: Secondary | ICD-10-CM | POA: Diagnosis not present

## 2023-12-30 DIAGNOSIS — M19072 Primary osteoarthritis, left ankle and foot: Secondary | ICD-10-CM | POA: Diagnosis not present

## 2023-12-30 MED ORDER — BETAMETHASONE SOD PHOS & ACET 6 (3-3) MG/ML IJ SUSP
3.0000 mg | Freq: Once | INTRAMUSCULAR | Status: AC
  Administered 2023-12-30: 3 mg via INTRA_ARTICULAR

## 2023-12-30 NOTE — Progress Notes (Signed)
 Chief Complaint  Patient presents with   Injections    Pt is here due to injections to bilateral feet due to pain.    Subjective:  63 year old female PMHx rheumatoid arthritis presenting today for follow-up management of chronic bilateral midtarsal pain and arthritis.  She is being seen by rheumatology.  Injections have helped significantly in the past.  She continues to wear her custom orthotics  Past Medical History:  Diagnosis Date   Allergic rhinitis 05/31/2012   Breast cancer (HCC)    right breast, dx 10/2021.   Chest pain    PT SAW CARDIOLOGIST DR. WONDA - STRESS ECHO DONE NEGATIVE - NO PROBLEM SINCE   Chicken pox as achild   Cough 11/29/2012   RESOLVED - IT WAS CAUSED BY LISINOPRIL   COVID-19    11/2022   Elevated LFTs 05/31/2012   Eustachian tube dysfunction 07/27/2017   GERD (gastroesophageal reflux disease)    H/O tobacco use, presenting hazards to health 11/29/2012   Smoked 1 1/2 ppd for 20 years quit in roughtly 2007   Headache 11/25/2015   Hematuria 04/09/2014   Hoarseness 02/04/2012   RESOLVED - THOUGHT TO HAVE BEEN ALLERY RELATED   Hyperglycemia 04/06/2014   Hyperlipidemia    Hyperparathyroidism    Hypertension    Obesity, unspecified 05/31/2012   Ocular migraine    Overactive bladder 05/31/2012   Pinched nerve 07/23/2023   Right hip   Preventative health care 02/04/2012   PTSD (post-traumatic stress disorder)    Rapid heart beat    PT FEELS PROB RELATED TO HYPER PARATHYROIDISM   Rheumatoid arthritis (HCC)    SUI (stress urinary incontinence, female) 02/04/2012   Sees Dr Scottie and they have discussed a bladder tack but so far she declines   Walking pneumonia 03/17/2023   Past Surgical History:  Procedure Laterality Date   BREAST EXCISIONAL BIOPSY Left    Age 51 Fibroadenoma   BREAST SURGERY     left breast excision of adenoma   btl     COLONOSCOPY  07/2020   FOOT SURGERY     MASTECTOMY Bilateral 11/28/2021   PARATHYROIDECTOMY Right  08/12/2013   Procedure: right inferior frozen section PARATHYROIDECTOMY;  Surgeon: Krystal CHRISTELLA Spinner, MD;  Location: [facility];  Service: General;  Laterality: Right;   PELVIC LAPAROSCOPY  03/2004   PLACEMENT OF BREAST IMPLANTS  01/15/2023   History of breast cancer   REPLACEMENT TOTAL KNEE Right 08/04/2023   TISSUE EXPANDER PLACEMENT Bilateral 08/14/2022   at [facility] REPLACEMENT Right 01/2019   TUBAL LIGATION     WISDOM TOOTH EXTRACTION  30 yrs ago   Allergies  Allergen Reactions   Cefdinir Other (See Comments) and Nausea And Vomiting    Severe Stomach cramps and diarrhea for several days.   Codeine Nausea And Vomiting   Doxycycline  Diarrhea   Penicillins     RASH, ITCHING   Metformin And Related     Diarrhea    Other Rash   Simvastatin      myalgias   Objective: Physical Exam General: The patient is alert and oriented x3 in no acute distress.  Dermatology: Skin is cool, dry and supple bilateral lower extremities. Negative for open lesions or macerations.  Vascular: Palpable pedal pulses bilaterally.  Capillary refill WNL.  Clinically no concern for vascular compromise  Neurological: Grossly intact via light touch  Musculoskeletal Exam: Chronic pain on palpation throughout the midtarsal joint bilateral  Radiographic exam B/L feet 05/20/2023 Advanced severe  degenerative changes noted throughout the lesser TMT joints consistent with the patient's given history of rheumatoid and osteoarthritis.  Collapse of the midtarsal joints also noted left greater than the right  Assessment: 1. Chronic bilateral midfoot capsulitis/arthritis 2. History of rheumatoid arthritis; managed by rheumatology  Plan of Care:  -Patient was evaluated.   - Injection of 0.5 cc Celestone  Soluspan injected into the bilateral midfoot midtarsal joints -Continue close management with rheumatology -continue custom molded orthotics to support the medial longitudinal arch of the foot -Return to  clinic PRN  *best friends with Landry Comer from [facility]  Thresa EMERSON Sar, DPM [facility]  Dr. Thresa EMERSON Sar, DPM    [address] [place], [place] [address]                Office [phone]  Fax [phone]

## 2023-12-31 ENCOUNTER — Ambulatory Visit: Admitting: Physician Assistant

## 2024-01-07 ENCOUNTER — Other Ambulatory Visit: Payer: Self-pay | Admitting: Family Medicine

## 2024-01-07 ENCOUNTER — Other Ambulatory Visit: Payer: Self-pay

## 2024-01-08 ENCOUNTER — Other Ambulatory Visit: Payer: Self-pay

## 2024-01-08 DIAGNOSIS — Z4889 Encounter for other specified surgical aftercare: Secondary | ICD-10-CM | POA: Diagnosis not present

## 2024-01-08 NOTE — Progress Notes (Signed)
 Specialty Pharmacy Refill Coordination Note  Sandra [name] is a 63 y.o. female contacted today regarding refills of specialty medication(s) Upadacitinib  (Rinvoq )   Patient requested Delivery   Delivery date: 01/12/24   Verified address: [address]   [place] [address]   Medication will be filled on: 01/11/24

## 2024-01-08 NOTE — Progress Notes (Signed)
 Specialty Pharmacy Ongoing Clinical Assessment Note  Sandra [name] is a 63 y.o. female who is being followed by the specialty pharmacy service for RxSp Rheumatoid Arthritis   Patient's specialty medication(s) reviewed today: Upadacitinib  (Rinvoq )   Missed doses in the last 4 weeks: 7   Patient/Caregiver did not have any additional questions or concerns.   Therapeutic benefit summary: Patient is achieving benefit   Adverse events/side effects summary: No adverse events/side effects   Patient's therapy is appropriate to: Continue    Goals Addressed             This Visit's Progress    Minimize recurrence of flares   On track    Patient is on track. Patient will maintain adherence         Follow up: 12 months  [facility]

## 2024-01-11 ENCOUNTER — Other Ambulatory Visit: Payer: Self-pay

## 2024-01-12 ENCOUNTER — Other Ambulatory Visit: Payer: Self-pay | Admitting: Pharmacist

## 2024-01-12 DIAGNOSIS — I10 Essential (primary) hypertension: Secondary | ICD-10-CM

## 2024-01-12 DIAGNOSIS — E1165 Type 2 diabetes mellitus with hyperglycemia: Secondary | ICD-10-CM

## 2024-01-12 DIAGNOSIS — E669 Obesity, unspecified: Secondary | ICD-10-CM

## 2024-01-12 MED ORDER — FENOFIBRATE MICRONIZED 134 MG PO CAPS: 134.0000 mg | ORAL_CAPSULE | Freq: Every day | ORAL | 0 refills | Status: DC

## 2024-01-12 NOTE — Progress Notes (Signed)
 01/12/2024 Name: Sandra [name] MRN: [id number] DOB: June 08, 1960  Chief Complaint  Patient presents with   Medication Management    Sandra [name] is a 63 y.o. year old female she was referred to the pharmacist by their PCP for assistance in managing medication access.   Spoke with Sandra [name] by phone today.  Subjective:  Patient had back surgery in October. She fell shortly after returning home. She states she feels like fall was related to taking oxycodone  which caused confusion.  She currently is taking hydrocodone  / acetaminophen  5/325mg  up to every 6 to 8 hours as needed - per patient she is taking about every 7 hours or 3 times a day.  She also is taking Robaxin  if needed for muscle spasms, prednisone  taper and alprazolam  0.25mg  if needed to help with sleep (has taken about 3 or 4 tablets since her surgery).   Type 2 DM  and obesity  Diagnosed with type 2 DM 10/2022.  Current therapy: Mounjaro  15mg  weekly - does was increased around 12/29/2023 Initially started Mounjaro  2.5mg  11/01/2022.   Previous medications tried:  metformin - caused diarrhea.   Patient has rheumatoid arthritis and reports she has steroid injection about every 3 months in feet or hip. She also will sometimes need systemic /oral prednisone  for burst therapy when RA is not well controlled.  Starting weight = 206lbs; Recent weight = 153 lbs at home today Has lost about 53 lbs (Mounjaro  start date 11/01/2022) Starting BMI = 34.4; Current BMI = 25.5 Height = 5' 5  Wt Readings from Last 3 Encounters:  12/30/23 156 lb 6.4 oz (70.9 kg)  12/08/23 156 lb 6.4 oz (70.9 kg)  09/24/23 160 lb 3.2 oz (72.7 kg)    She is using One Touch Verio to check her blood glucose daily.  Recent home blood glucose range 90 to 140's  Diet: Continues to follow keto type diet. 1500 calories or less per day.  Usually eats 2 meals per day - 11am and 6pm. If she snacks between meals is usually low sugar yogurt or a  few blueberries.  Lunch is tuna salad or green salad; cottage cheese Dinner - meat + veggies; occasionally will have a small portion of low carb pasta or rice.    Hyperlipidemia:  Current therapy: atorvastatin  80mg  daily, ezetimibe  10mg  daily and fenofibrate  134mg  daily  LDL is not at goal but had improved 147 >> 131 >> 117 >> 101. It was recommended she consider referral to lipid clinic but patient declined/  Hypertension:  Current therapy: irbesartan  150mg  daily and furosemide  20mg  daily   She is not checking blood pressure at home.  BP Readings from Last 3 Encounters:  12/08/23 133/84  09/24/23 130/86  07/30/23 (!) 154/97    Osteopenia:  Currently taking Relast once per year and vitamin D  5000 units daily Sees Sandra Sandra [name].   Recent DEXA at [facility] 08/12/202 Sandra [name] - 10/20/2023   INDICATION: Age-related osteoporosis without current pathological fracture  TECHNIQUE:  Standard bone densitometry was performed.  FINDINGS:      LUMBAR SPINE:  The bone mineral density of the lumbar spine from L1-L2 is 0.913 g/cm2.  T-score: -2.1  Z-score: -1.1  Additional comments/Omitted levels: L3 and L4  Comparison: Previous T score at L1-L2 was -1.6.   PROXIMAL FEMUR:  The bone mineral density as measured at the right femoral neck region of interest is 0.779 g/cm2.  T-score: -1.9  Z-score: -0.8  Comparison:  Previous right femoral neck score was -1.5.   Additional sites: None   IMPRESSION:   Osteopenia/low bone mass.  Fracture Risk:  Not calculated  FRAX was not performed.  Treatment Recommendations: FRAX is not available.  Treatment per clinical discretion.  Vitamin D  and calcium  DO NOT constitute treatment for osteopenia and osteoporosis.   NOTE: Hip or vertebral (clinical or morphometric) fractures may be an indication of osteopenia.  Refer to NOF recommendations.    Follow-up recommendations: Repeat bone densitometry in 2 years.   Previous  DEXA at [facility] from 09/09/2021 ASSESSMENT: The BMD measured at Forearm Radius 33% is 0.741 g/cm2 with a T-score of -1.5. This patient is considered osteopenic according to World Health Organization [facility]) criteria. Compared with the prior study on, 08/12/2019 the BMD of the total mean shows no statistically significant change. The scan quality is good.   Site Region Measured Date Measured Age WHO YA BMD Classification T-score AP Spine L1-L4 09/09/2021 59.6 Low Bone Mass -1.1 1.042 g/cm2 AP Spine L1-L4 08/12/2019 57.5 Low Bone Mass -1.2 1.033 g/cm2   DualFemur Total Mean 09/09/2021 59.6 Normal -0.6 0.937 g/cm2 DualFemur Total Mean 08/12/2019 57.5 Normal -0.5 0.939 g/cm2   Left Forearm Radius 33% 09/09/2021 59.6 Low Bone Mass -1.5 0.741 g/cm2 Left Forearm Radius 33% 08/12/2019 57.5 Low Bone Mass -1.4 0.751 g/cm2  Current Pharmacy:  [id number] - [place], [place] - [address] AT [facility] [address] [place] [address] Phone: [phone] Fax: [phone]    Objective:  Lab Results  Component Value Date   HGBA1C 5.2 11/26/2023    Lab Results  Component Value Date   CREATININE 0.6 11/26/2023   BUN 13 11/26/2023   NA 140 11/26/2023   K 4.5 11/26/2023   CL 105 11/26/2023   CO2 25 (A) 11/26/2023    Lab Results  Component Value Date   CHOL 193 05/12/2023   HDL 63.90 05/12/2023   LDLCALC 101 (H) 05/12/2023   LDLDIRECT 179.0 09/03/2021   TRIG 137.0 05/12/2023   CHOLHDL 3 05/12/2023    Medications Reviewed Today     Reviewed by Sandra [name], RPH-CPP (Pharmacist) on 01/12/24 at 1107  Med List Status: <None>   Medication Order Taking? Sig Documenting Provider Last Dose Status Informant  ALPRAZolam  (XANAX ) 0.25 MG tablet [id number] Yes Take 1 tablet (0.25 mg total) by mouth daily as needed. Sandra [name]  Active   aspirin 325 MG tablet [id number] Yes Take 325 mg by mouth daily. Provider,  Historical, [name]  Active   atorvastatin  (LIPITOR) 80 MG tablet [id number] Yes TAKE 1 TABLET BY MOUTH EVERY DAY Sandra [name]  Active   Blood Glucose Monitoring Suppl (ONETOUCH VERIO FLEX SYSTEM) w/Device KIT [id number] Yes Use to check blood glucose 1 to 2 times per day Sandra [name]  Active   Cholecalciferol (VITAMIN D3) 125 MCG (5000 UT) CAPS [id number] Yes Take by mouth 2 (two) times a week. Provider, Historical, [name]  Active    Patient not taking:   Discontinued 01/12/24 1102 (Completed Course)   escitalopram  (LEXAPRO ) 10 MG tablet [id number] Yes Take 1 tablet (10 mg total) by mouth daily. Sandra [name]  Active   ezetimibe  (ZETIA ) 10 MG tablet [id number] Yes Take 1 tablet (10 mg total) by mouth daily. Sandra [name]  Active   fenofibrate  micronized (LOFIBRA) 134 MG capsule [id number]  TAKE 1 CAPSULE BY MOUTH DAILY BEFORE BREAKFAST. Sandra [name]  Active   furosemide  (LASIX ) 20 MG tablet [id number] Yes Take 1 tablet (20 mg total) by mouth daily. Sandra [name]  Active   glucose blood [facility] VERIO) test strip [id number]  Use to check blood glucose 1 to 2 times per day Sandra [name]  Active   HYDROcodone -acetaminophen  (NORCO/VICODIN) 5-325 MG tablet [id number] Yes Take 1 tablet by mouth. Every 6 to 8 hours as needed Provider, Historical, [name]  Active   hydrocortisone-pramoxine [facility]) 2.5-1 % rectal cream [id number]  as needed. Provider, Historical, [name]  Active   irbesartan  (AVAPRO ) 150 MG tablet [id number] Yes TAKE 1 TABLET BY MOUTH EVERY DAY Sandra [name]  Active   lactobacillus acidophilus (BACID) TABS tablet [id number]  Take 2 tablets by mouth 3 (three) times daily. Provider, Historical, [name]  Active   leflunomide  (ARAVA ) 20 MG tablet [id number] Yes TAKE 1 TABLET BY MOUTH EVERY DAY Dolphus Reiter, [name]  Active            Med Note (HILL, ADRIENNE P   Tue Dec 08, 2023  1:54 PM) On hold for surgery  levothyroxine  (SYNTHROID ) 75 MCG tablet [id number] Yes TAKE 1  TABLET BY MOUTH EVERY DAY BEFORE BREAKFAST Sandra [name]  Active    Patient not taking:   Discontinued 01/12/24 1055 (Change in therapy)   methocarbamol  (ROBAXIN ) 750 MG tablet [id number] Yes Take 750 mg by mouth 3 (three) times daily as needed. Provider, Historical, [name]  Active   metoprolol  succinate (TOPROL -XL) 25 MG 24 hr tablet [id number]  Take 1 tablet (25 mg total) by mouth daily. TAKE WITH OR IMMEDIATELY FOLLOWING A MEAL. Sandra [name]  Active   OMEGA-3 FATTY ACIDS PO [id number]  Take by mouth. Provider, Historical, [name]  Active   omeprazole  (PRILOSEC) 40 MG capsule [id number]  TAKE 1 CAPSULE (40 MG TOTAL) BY MOUTH DAILY. Sandra [name]  Active   ondansetron  (ZOFRAN -ODT) 4 MG disintegrating tablet [id number]  Take 4 mg by mouth as needed. Provider, Historical, [name]  Active   OneTouch Delica Lancets 33G MISC [id number]  Use to check blood glucose 1 to 2 times per day Sandra [name]  Active   predniSONE  (DELTASONE ) 5 MG tablet [id number] Yes Take 4 tabs po qd x 4 days, 3  tabs po qd x 4 days, 2  tabs po qd x 4 days, 1  tab po qd x 4 days Cheryl Waddell CHRISTELLA DEVONNA  Active   TART CHERRY PO [id number]  Take by mouth. Provider, Historical, [name]  Active   tirzepatide  (MOUNJARO ) 15 MG/0.5ML Pen [id number] Yes Inject 15 mg into the skin once a week. Sandra [name]  Active   Trospium Chloride 60 MG CP24 [id number]  trospium ER 60 mg capsule,extended release 24 hr  TAKE 1 CAPSULE BY MOUTH EVERY DAY Provider, Historical, [name]  Active   TURMERIC PO [id number]  Take 1 tablet by mouth 2 (two) times daily. Provider, Historical, [name]  Active   Upadacitinib  ER (RINVOQ ) 15 MG TB24 [id number] Yes Take 1 tablet (15 mg total) by mouth daily. Dolphus Reiter, [name]  Active            Med Note (HILL, ADRIENNE P   Tue Dec 08, 2023  1:54 PM) On hold for surgery  verapamil  (CALAN -SR) 120 MG CR tablet [id number] Yes Take 1 tablet (120 mg total) by mouth at bedtime. Sandra [name]  Active   zoledronic  acid (RECLAST ) 5  MG/100ML SOLN injection [id number]  Inject 5 mg into the vein. Last dose 06/15/2023 Provider, Historical, [name]  Active               Assessment/Plan:   Diabetes / obesity: Tolerating Mounjaro  well. Has lost  53 lbs. A1c and home blood glucose have improved.  - Continue Mounjaro  to 15mg  mg weekly.   - Continue with current diet changes.  - Reviewed home blood glucose readings and reviewed goals. Continue to check blood glucose at home 1 to 2 times per day. Fasting blood glucose goal (before meals) = 80 to 130 Blood glucose goal after a meal = less than 180   Hypertension - last blood pressure improved. Goal blood pressure < 130/80 - Continue current blood pressure medications.  Hyperlipidemia: Not at LDL goal but improved; TG is now at goal  - Continue atorvastatin  80mg  daily, ezetimibe  10mg  daily and fenofibrate  134mg  daily.  -  If LDL still > 100 with next lipid check, could consider adding either Nexletol / nexlizet or PCSK9 agent.   Meds ordered this encounter  Medications   fenofibrate  micronized (LOFIBRA) 134 MG capsule    Sig: Take 1 capsule (134 mg total) by mouth daily before breakfast.    Dispense:  90 capsule    Refill:  0    Follow up with Clinical Pharmacist Practitioner in 3 months; Sees PCP in January 2026  Madelin Ray, PharmD Clinical Pharmacist [facility]

## 2024-01-22 DIAGNOSIS — Z853 Personal history of malignant neoplasm of breast: Secondary | ICD-10-CM | POA: Diagnosis not present

## 2024-01-22 DIAGNOSIS — M5416 Radiculopathy, lumbar region: Secondary | ICD-10-CM | POA: Diagnosis not present

## 2024-01-22 DIAGNOSIS — Z885 Allergy status to narcotic agent status: Secondary | ICD-10-CM | POA: Diagnosis not present

## 2024-01-22 DIAGNOSIS — E039 Hypothyroidism, unspecified: Secondary | ICD-10-CM | POA: Diagnosis not present

## 2024-01-22 DIAGNOSIS — W19XXXA Unspecified fall, initial encounter: Secondary | ICD-10-CM | POA: Diagnosis not present

## 2024-01-22 DIAGNOSIS — K219 Gastro-esophageal reflux disease without esophagitis: Secondary | ICD-10-CM | POA: Diagnosis not present

## 2024-01-22 DIAGNOSIS — E119 Type 2 diabetes mellitus without complications: Secondary | ICD-10-CM | POA: Diagnosis not present

## 2024-01-22 DIAGNOSIS — Z7985 Long-term (current) use of injectable non-insulin antidiabetic drugs: Secondary | ICD-10-CM | POA: Diagnosis not present

## 2024-01-22 DIAGNOSIS — G4733 Obstructive sleep apnea (adult) (pediatric): Secondary | ICD-10-CM | POA: Diagnosis not present

## 2024-01-22 DIAGNOSIS — Z79899 Other long term (current) drug therapy: Secondary | ICD-10-CM | POA: Diagnosis not present

## 2024-01-22 DIAGNOSIS — G43909 Migraine, unspecified, not intractable, without status migrainosus: Secondary | ICD-10-CM | POA: Diagnosis not present

## 2024-01-22 DIAGNOSIS — Z880 Allergy status to penicillin: Secondary | ICD-10-CM | POA: Diagnosis not present

## 2024-01-22 DIAGNOSIS — D62 Acute posthemorrhagic anemia: Secondary | ICD-10-CM | POA: Diagnosis not present

## 2024-01-22 DIAGNOSIS — Z981 Arthrodesis status: Secondary | ICD-10-CM | POA: Diagnosis not present

## 2024-01-22 DIAGNOSIS — E7800 Pure hypercholesterolemia, unspecified: Secondary | ICD-10-CM | POA: Diagnosis not present

## 2024-01-22 DIAGNOSIS — T84428A Displacement of other internal orthopedic devices, implants and grafts, initial encounter: Secondary | ICD-10-CM | POA: Diagnosis not present

## 2024-01-22 DIAGNOSIS — Z888 Allergy status to other drugs, medicaments and biological substances status: Secondary | ICD-10-CM | POA: Diagnosis not present

## 2024-01-22 DIAGNOSIS — M810 Age-related osteoporosis without current pathological fracture: Secondary | ICD-10-CM | POA: Diagnosis not present

## 2024-01-22 DIAGNOSIS — Z881 Allergy status to other antibiotic agents status: Secondary | ICD-10-CM | POA: Diagnosis not present

## 2024-01-22 DIAGNOSIS — Z882 Allergy status to sulfonamides status: Secondary | ICD-10-CM | POA: Diagnosis not present

## 2024-01-22 DIAGNOSIS — M4316 Spondylolisthesis, lumbar region: Secondary | ICD-10-CM | POA: Diagnosis not present

## 2024-01-22 DIAGNOSIS — Z79890 Hormone replacement therapy (postmenopausal): Secondary | ICD-10-CM | POA: Diagnosis not present

## 2024-01-22 DIAGNOSIS — F419 Anxiety disorder, unspecified: Secondary | ICD-10-CM | POA: Diagnosis not present

## 2024-01-22 DIAGNOSIS — Z87891 Personal history of nicotine dependence: Secondary | ICD-10-CM | POA: Diagnosis not present

## 2024-01-22 DIAGNOSIS — Y793 Surgical instruments, materials and orthopedic devices (including sutures) associated with adverse incidents: Secondary | ICD-10-CM | POA: Diagnosis not present

## 2024-01-22 DIAGNOSIS — T84226A Displacement of internal fixation device of vertebrae, initial encounter: Secondary | ICD-10-CM | POA: Diagnosis not present

## 2024-01-22 DIAGNOSIS — I10 Essential (primary) hypertension: Secondary | ICD-10-CM | POA: Diagnosis not present

## 2024-01-22 DIAGNOSIS — M48061 Spinal stenosis, lumbar region without neurogenic claudication: Secondary | ICD-10-CM | POA: Diagnosis not present

## 2024-01-25 NOTE — Discharge Summary (Signed)
 [facility]  Discharge Summary  PCP: HARLENE ARLEAN HORTON, MD Discharge Details   Admit date:         01/22/2024 Discharge date:         01/25/2024  Hospital LOS:    3 days  Active Hospital Problems   Diagnosis Date Noted POA  . *S/P lumbar fusion 01/22/2024 Not Applicable    Resolved Hospital Problems  No resolved problems to display.      Current Discharge Medication List     CONTINUE these medications which have CHANGED      Details  HYDROcodone -acetaminophen  5-325 mg per tablet Commonly known as: NORCO What changed: reasons to take this  Take one tablet by mouth every 6 (six) hours as needed for Pain for up to 8 days. Max Daily Amount: 4 tablets Quantity: 30 tablet   methocarbamol  750 mg tablet Commonly known as: ROBAXIN  What changed:  when to take this reasons to take this  Take one tablet (750 mg dose) by mouth 4 (four) times a day as needed (muscle spasm). Quantity: 35 tablet   tirzepatide  15 mg/0.5 mL Soaj injection Commonly known as: MOUNJARO  What changed: Another medication with the same name was removed. Continue taking this medication, and follow the directions you see here.  Inject 0.5 mLs (15 mg dose) into the skin once a week at 0900. Saturdays       CONTINUE these medications which have NOT CHANGED      Details  acetaminophen  500 mg tablet Commonly known as: TYLENOL   Take two tablets (1,000 mg dose) by mouth every 8 (eight) hours as needed for Pain.   atorvastatin  80 mg tablet Commonly known as: LIPITOR  Take one tablet (80 mg dose) by mouth at bedtime.   escitalopram  oxalate 10 mg tablet Commonly known as: LEXAPRO   Take one tablet (10 mg dose) by mouth daily.   ezetimibe  10 MG tablet Commonly known as: ZETIA   Take one tablet (10 mg dose) by mouth every evening.   fenofibrate  micronized 134 mg capsule Commonly known as: LOFIBRA  Take one capsule (134 mg dose) by mouth every evening.   furosemide  20 mg  tablet Commonly known as: LASIX   Take one tablet (20 mg dose) by mouth at bedtime.   hydrocortisone-pramoxine 2.5-1 % rectal cream Commonly known as: ANALPRAM-HC  Place one Application rectally as needed.   irbesartan  150 MG tablet Commonly known as: AVAPRO   Take one tablet (150 mg dose) by mouth at bedtime.   leflunomide  20 mg tablet Commonly known as: ARAVA   Take one tablet (20 mg dose) by mouth every morning.   levothyroxine  sodium 75 mcg tablet Commonly known as: SYNTHROID ,LEVOTHROID,LEVOXYL   Take by mouth 1 (one) time each day before breakfast.   metoprolol  succinate 25 mg 24 hr tablet Commonly known as: TOPROL -XL  Take one tablet (25 mg dose) by mouth at bedtime.   OMEGA-3 FATTY ACIDS PO  Take by mouth.   omeprazole  40 mg capsule Commonly known as: PRILOSEC  Take one capsule (40 mg dose) by mouth every morning.   PROBIOTIC DAILY PO  Take 1 capsule by mouth daily.   promethazine  12.5 MG tablet Commonly known as: PHENERGAN   Take one tablet (12.5 mg dose) by mouth every 6 (six) hours as needed. Quantity: 120 tablet   RECLAST  5 mg/100 mL Soln Generic drug: zoledronic  acid  Inject five mg into the vein Once a year.   sennosides-docusate sodium 8.6-50 mg per tablet Commonly known as: SENOKOT-S  Take two tablets by mouth daily as needed for Constipation. Quantity: 30 tablet   TART CHERRY PO  Take 1 capsule by mouth every morning.   trospium chloride 60 mg 24 hr capsule Commonly known as: SANCTURA XR  Take one capsule (60 mg dose) by mouth daily.   upadacitinib  15 MG ER tablet Commonly known as: RINVOQ   Take one tablet (15 mg dose) by mouth every morning. Quantity: 30 tablet   verapamil  120 mg ER tablet Commonly known as: CALAN -SR  Take one tablet (120 mg dose) by mouth at bedtime.      * You might also be taking other medications not listed above. If you have questions about any of your other medications, talk to the person who prescribed them or your  Primary Care Provider.          STOP taking these medications    ALPRAZolam  0.25 mg tablet Commonly known as: XANAX    predniSONE  5 mg tablet Commonly known as: DELTASONE         Hospital Course  Physicians involved in care during this hospitalization Attending Provider: J. Waddell Lager, MD Admitting Provider: JINNY. Waddell Lager, MD Anesthesiologist: Elsa ELINORE Familia, MD Anesthesiologist: Ozell ONEIDA Scala, MD  Indication for Admission: Patient was having increased pain and decreased quality of life, and was advised to proceed with the following procedure after an office visit revealed hardware was not adequately across the disc space as intended:   Procedure(s) (LRB): REVISION OF POSTERIOR L3-L5 SPINAL INSTRUMENTATION WITH REMOVAL OF L4-L5 INTERBODY DEVICE (Posterior)  01/22/2024  Surgeon(s): J. Waddell Lager, MD -------------------  Hospital Course:       Patient underwent procedure as above without complication. Following surgery, the patient was transferred from the PACU to the orthopedic floor in stable condition.  Vital signs have remained satisfactory throughout hospital course.  Operative dressing remained clean and dry without signs of compromise.  Laboratory data was reviewed daily and remained stable.  The patient was successfully transitioned to appropriate oral pain medications.  DVT prophylaxis in the form of lower extremity compression device was initiated postoperatively and continued.  The patient progressed in physical therapy ambulating in room.  Case management participated as needed in developing an appropriate discharge plan in accordance to the patient's desired goals.  At this time the patient is deemed stable for discharge.    Disposition: home with HHPT  Discharge condition: stable  Post Hospital Care   Discharge Procedure Orders  Discharge instructions  Order Comments: [facility]  POST-OP ORTHOPAEDIC SPINE  SURGERY:   - You were in the hospital for orthopedic surgery. It is normal to feel tired or washed out after surgery and this feeling should improve over the first few days to week.    - Resume your regular activities as tolerated.   - Please avoid excessive bending, twisting and lifting items greater than 10 lbs until you confirm with your provider when it is safe to do so.    - Narcotic pain medication is addictive and is meant to take the edge off the pain, not take the pain away completely. You should expect to feel some aching and soreness after surgery.      Medicines    - Resume taking your home medications unless specifically instructed to stop by your surgeon. Please talk to your primary care doctor within the next 1-2 weeks regarding this hospitalization and any changes to your home medications that may be necessary.    - Do not  drink alcohol, drive, or operate machinery while you are taking narcotic pain relievers (ex: Oxycodone /Dilaudid ) or muscle relaxants (Tizanidine, Flexeril).    - As your pain lessens, decrease the amount of narcotic pain relievers you are taking. Instead, take acetaminophen  (also called Tylenol ). Follow all instructions on the medication bottle and never take more than 3,000mg  of Tylenol  in a single day.    - If you need medication refills, call your surgeon's office 2-3 days before you need the refill or send your provider a message on [facility]. Your team will send your prescriptions electronically to your pharmacy of choice, so make sure you have your correct preferred pharmacy listed.    - Please note: if you have been instructed to avoid NSAIDs after a fusion procedure, please avoid taking ibuprofen or aleve for the first 6 weeks after surgery.       Constipation    Both surgery and narcotic pain relievers can cause constipation. Please follow the advice below to help prevent constipation:   - Drink 8 glasses of water and/or other fluids  like juice, tea, and broth to stay well hydrated.    - Eat foods that are high in fiber, like fruits and vegetables.    - Please take a stool softener like docusate (also called Colace) to help prevent constipation while you are taking narcotic pain relievers.    - You may also take a laxative such as Senna (also called Senokot) to help promote regular bowel movements.    - You can buy Senna or Colace over the counter. Stop taking them if your bowel movements become loose. If your bowel movements continue to stay loose after stopping these medications, please call your doctor.       Wound Care   - You may remove the dressing in 6 days and if it is dry at that point, you may continue to leave it open to air. You may leave the steri strips on until they fall of on their own.    - Drainage can be normal for the first couple weeks after surgery. A good rule of thumb is to continue to change the bandage as needed until it stops. You may change the bandage and replace with dry sterile gauze and a cover of your choosing.    - You may shower. Please leave the wound covered with a waterproof dressing until you have removed the bandage at postoperative day 6. You can gently cleanse the wound site until then with water and pat it over the area and replace the dressing afterwards.   - Once you have removed the dressing for good, (when showering) you may let water run over the incision, but do not vigorously scrub the surgical site. Pat the area dry after showering. No baths or swimming for at least 4 weeks. (See next bullet for other instructions about showering if drainage is still present).    - If you are still having drainage from your incision at 6 days post op when you go to remove the bandage, please continue to shower with a dressing in place. Cover the incision with a waterproof dressing while you are in the shower until the drainage stops.    - If you have questions about the amount of drainage  present, you may send a photo of the incision site and the bandage that was removed to your provider on Patient Gateway. This is the best way for your team to assess the wound.  Danger Signs   Please call your PCP or surgeon's office and/or return to the emergency department if you experience any of the following:   - Increasing pain that is not controlled with pain medications   - Increasing redness, swelling, drainage, or other concerning changes in your incision   - Persistent or increasing numbness, tingling, or loss of sensation   - Fever > 101.f F   - Shaking chills   - Chest pain   - Shortness of breath   - Nausea or vomiting with an inability to keep food, liquid, medications down   - Any other medical concerns      Call 911 if you have:    * Chest pain or shortness of breath    * Severe headache or confusion    * Weakness    * Coughing up large amounts of bright red blood, vomiting blood, or bleeding that you cannot stop    * A fall or injury to your head    Remember: In an emergency, ALWAYS call 911.       Follow up    - Please follow up with your surgeon and their team in 2-3 weeks for a wound check and general post-operative evaluation.   - You will again have another follow up appointment at 6 weeks post op or sooner if needed.    - Call our office, [phone] with any questions.    Unresulted [employer] Current Status   Prepare RBC, 2 Units Preliminary result           Electronically signed: Krystal LITTIE Horning, PA-C 01/25/2024 / 10:57 AM  *Some images could not be shown.

## 2024-01-26 ENCOUNTER — Telehealth: Payer: Self-pay

## 2024-01-26 NOTE — Transitions of Care (Post Inpatient/ED Visit) (Signed)
   01/26/2024  Name: Sandra [name] MRN: [id number] DOB: 1961/03/05  Today's TOC FU Call Status: Today's TOC FU Call Status:: Unsuccessful Call (1st Attempt) Unsuccessful Call (1st Attempt) Date: 01/26/24  Attempted to reach the patient regarding the most recent Inpatient/ED visit.  Follow Up Plan: Additional outreach attempts will be made to reach the patient to complete the Transitions of Care (Post Inpatient/ED visit) call.   Medford Balboa, BSN, RN [place]  [facility] - [employer] [facility] RN Care Manager [phone]

## 2024-02-03 ENCOUNTER — Other Ambulatory Visit: Payer: Self-pay

## 2024-02-09 ENCOUNTER — Other Ambulatory Visit: Payer: Self-pay

## 2024-02-10 ENCOUNTER — Other Ambulatory Visit: Payer: Self-pay

## 2024-02-10 ENCOUNTER — Encounter (INDEPENDENT_AMBULATORY_CARE_PROVIDER_SITE_OTHER): Payer: Self-pay

## 2024-02-10 NOTE — Progress Notes (Signed)
 Specialty Pharmacy Refill Coordination Note  Sandra [name] is a 63 y.o. female contacted today regarding refills of specialty medication(s) Upadacitinib  (Rinvoq )   Patient requested (Patient-Rptd) Delivery   Delivery date: 02/15/24   Verified address: (Patient-Rptd) [address] [place], [place], [place] [address]   Medication will be filled on: 02/12/24  Patient called in after questionnaire submission and was informed of delivery date over the phone, mychart message to inform was not needed.

## 2024-02-11 ENCOUNTER — Other Ambulatory Visit: Payer: Self-pay

## 2024-02-15 DIAGNOSIS — Z853 Personal history of malignant neoplasm of breast: Secondary | ICD-10-CM | POA: Diagnosis not present

## 2024-02-16 DIAGNOSIS — M5416 Radiculopathy, lumbar region: Secondary | ICD-10-CM | POA: Diagnosis not present

## 2024-02-16 DIAGNOSIS — M4316 Spondylolisthesis, lumbar region: Secondary | ICD-10-CM | POA: Diagnosis not present

## 2024-02-16 DIAGNOSIS — M48061 Spinal stenosis, lumbar region without neurogenic claudication: Secondary | ICD-10-CM | POA: Diagnosis not present

## 2024-03-01 ENCOUNTER — Other Ambulatory Visit: Payer: Self-pay | Admitting: Rheumatology

## 2024-03-01 NOTE — Telephone Encounter (Signed)
 Last Fill: 12/03/2023  Labs: 01/25/2024 Sodium 135, Creat. 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

## 2024-03-02 ENCOUNTER — Other Ambulatory Visit: Payer: Self-pay

## 2024-03-02 ENCOUNTER — Other Ambulatory Visit: Payer: Self-pay | Admitting: Rheumatology

## 2024-03-04 ENCOUNTER — Other Ambulatory Visit: Payer: Self-pay

## 2024-03-04 MED ORDER — RINVOQ 15 MG PO TB24
15.0000 mg | ORAL_TABLET | Freq: Every day | ORAL | 2 refills | Status: AC
  Filled 2024-03-04 – 2024-03-11 (×4): qty 30, 30d supply, fill #0

## 2024-03-04 NOTE — Telephone Encounter (Signed)
 I am sending the refill on Rinvoq .  Her labs are due now.  Please advise patient to come in for labs as soon as possible.

## 2024-03-07 ENCOUNTER — Other Ambulatory Visit: Payer: Self-pay

## 2024-03-07 ENCOUNTER — Other Ambulatory Visit: Payer: Self-pay | Admitting: *Deleted

## 2024-03-07 DIAGNOSIS — Z79899 Other long term (current) drug therapy: Secondary | ICD-10-CM

## 2024-03-08 ENCOUNTER — Ambulatory Visit: Payer: Self-pay | Admitting: Physician Assistant

## 2024-03-08 LAB — CBC WITH DIFFERENTIAL/PLATELET
Absolute Lymphocytes: 2246 {cells}/uL (ref 850–3900)
Absolute Monocytes: 365 {cells}/uL (ref 200–950)
Basophils Absolute: 30 {cells}/uL (ref 0–200)
Basophils Relative: 0.8 %
Eosinophils Absolute: 30 {cells}/uL (ref 15–500)
Eosinophils Relative: 0.8 %
HCT: 33.7 % — ABNORMAL LOW (ref 35.9–46.0)
Hemoglobin: 10.6 g/dL — ABNORMAL LOW (ref 11.7–15.5)
MCH: 27.9 pg (ref 27.0–33.0)
MCHC: 31.5 g/dL — ABNORMAL LOW (ref 31.6–35.4)
MCV: 88.7 fL (ref 81.4–101.7)
MPV: 9.7 fL (ref 7.5–12.5)
Monocytes Relative: 9.6 %
Neutro Abs: 1129 {cells}/uL — ABNORMAL LOW (ref 1500–7800)
Neutrophils Relative %: 29.7 %
Platelets: 525 Thousand/uL — ABNORMAL HIGH (ref 140–400)
RBC: 3.8 Million/uL (ref 3.80–5.10)
RDW: 15.2 % — ABNORMAL HIGH (ref 11.0–15.0)
Total Lymphocyte: 59.1 %
WBC: 3.8 Thousand/uL (ref 3.8–10.8)

## 2024-03-08 LAB — COMPREHENSIVE METABOLIC PANEL WITH GFR
AG Ratio: 1.7 (calc) (ref 1.0–2.5)
ALT: 10 U/L (ref 6–29)
AST: 18 U/L (ref 10–35)
Albumin: 4.3 g/dL (ref 3.6–5.1)
Alkaline phosphatase (APISO): 37 U/L (ref 37–153)
BUN: 11 mg/dL (ref 7–25)
CO2: 27 mmol/L (ref 20–32)
Calcium: 10 mg/dL (ref 8.6–10.4)
Chloride: 102 mmol/L (ref 98–110)
Creat: 0.65 mg/dL (ref 0.50–1.05)
Globulin: 2.5 g/dL (ref 1.9–3.7)
Glucose, Bld: 105 mg/dL — ABNORMAL HIGH (ref 65–99)
Potassium: 5 mmol/L (ref 3.5–5.3)
Sodium: 138 mmol/L (ref 135–146)
Total Bilirubin: 0.3 mg/dL (ref 0.2–1.2)
Total Protein: 6.8 g/dL (ref 6.1–8.1)
eGFR: 99 mL/min/1.73m2

## 2024-03-11 ENCOUNTER — Other Ambulatory Visit (HOSPITAL_COMMUNITY): Payer: Self-pay

## 2024-03-16 ENCOUNTER — Telehealth: Payer: Self-pay | Admitting: Pharmacist

## 2024-03-16 ENCOUNTER — Other Ambulatory Visit (HOSPITAL_COMMUNITY): Payer: Self-pay

## 2024-03-16 NOTE — Telephone Encounter (Signed)
 Patient may be due for Rinvoq  PA renewal but insurance not active. MyChart message sent to patient for update nn insurance status  Sherry Pennant, PharmD, MPH, BCPS, CPP Clinical Pharmacist

## 2024-03-23 ENCOUNTER — Other Ambulatory Visit: Payer: Self-pay | Admitting: Family Medicine

## 2024-03-23 NOTE — Assessment & Plan Note (Signed)
 Check labs patient asymptomatic

## 2024-03-23 NOTE — Assessment & Plan Note (Signed)
Follows with Dr Buddy Duty, endocrinology. Feels better on Levothyroxine 50 mcg daily

## 2024-03-23 NOTE — Assessment & Plan Note (Signed)
 Well controlled, no changes to meds. Encouraged heart healthy diet such as the DASH diet and exercise as tolerated.

## 2024-03-23 NOTE — Assessment & Plan Note (Signed)
 Encourage heart healthy diet such as MIND or DASH diet, increase exercise, avoid trans fats, simple carbohydrates and processed foods, consider a krill or fish or flaxseed oil cap daily.

## 2024-03-23 NOTE — Assessment & Plan Note (Signed)
Continues to follow with Rheumatology

## 2024-03-23 NOTE — Assessment & Plan Note (Signed)
Bone density shows osteopenia, which is thinner than normal but not as bad as osteoporosis. Recommend calcium intake of 1200 to 1500 mg daily, divided into roughly 3 doses. Best source is the diet and a single dairy serving is about 500 mg, a supplement of calcium citrate once or twice daily to balance diet is fine if not getting enough in diet. Also need Vitamin D 2000 IU caps, 1 cap daily if not already taking vitamin D. Also recommend weight baring exercise on hips and upper body to keep bones strong  

## 2024-03-23 NOTE — Progress Notes (Signed)
 "  Subjective:    Patient ID: Sandra [name], female    DOB: 08-16-1960, 64 y.o.   MRN: [id number]  Chief Complaint  Patient presents with   Medical Management of Chronic Issues    Patient presents today for a 6 month follow-up.   Quality Metric Gaps    DEXA scan, foot & eye exam, urine microalbumin    HPI Discussed the use of AI scribe software for clinical note transcription with the patient, who gave verbal consent to proceed.  History of Present Illness Sandra [name] is a 64 year old female who presents for follow-up after recent spine surgeries and weight management.  She underwent two spine surgeries in October and November 2025 at [facility] in [place]. Following the first surgery, she experienced a fall at home, resulting in nerve pain radiating down her leg. She was told at the hospital that her hardware was all in place, but her symptoms persisted. She was told that during her subsequent surgery on January 09, 2024, one of her spacers had moved and was sitting on a nerve at L3-L5. The spacer was removed, and she reports that she has felt great ever since, with no incontinence or radicular symptoms post-surgery.  Her initial complaint was persistent low back pain radiating to the right hip, which began after stepping in a hole in her backyard. Prior to the spine surgeries, she had knee pain and underwent right knee surgery. Imaging and diagnostic studies, including MRI, CT scan, and DEXA scan, revealed arthritis and vertebral misalignment. Her bone density improved from osteoporosis to osteopenia with Reclast  treatment.  She has experienced significant weight loss from 207 lbs to 154 lbs with the use of Mounjaro , which she finds effective in controlling her blood sugar and reducing 'food noise'. Her blood sugar levels are generally in the high nineties, with a maximum of 120 and a minimum of 60 in the past month. She no longer checks her blood sugar daily.  She has a  history of a lung nodule, initially identified in 2022 as a 3 mm nodule in the left lower lobe, which has remained stable on follow-up imaging. She smoked one pack per day for 20 years, quitting in 2007.  She reports difficulty sleeping, often needing to change positions due to hip pain. She occasionally uses alprazolam  to aid sleep. She engages in activities that bring her happiness, such as traveling and spending time with family.  She has a history of anemia, possibly related to her surgeries, and reports feeling tired. An iron panel and CBC were last done in September 2025. No shortness of breath or chest pain.  She takes several medications and supplements, including Reclast  for bone density, Mounjaro  for weight management, and various supplements for rheumatoid arthritis. She is transitioning to Medicare in March 2026 and plans to manage her prescriptions accordingly.    Past Medical History:  Diagnosis Date   Allergic rhinitis 05/31/2012   Breast cancer (HCC)    right breast, dx 10/2021.   Chest pain    PT SAW CARDIOLOGIST DR. WONDA - STRESS ECHO DONE NEGATIVE - NO PROBLEM SINCE   Chicken pox as achild   Cough 11/29/2012   RESOLVED - IT WAS CAUSED BY LISINOPRIL   COVID-19    11/2022   Elevated LFTs 05/31/2012   Eustachian tube dysfunction 07/27/2017   GERD (gastroesophageal reflux disease)    H/O tobacco use, presenting hazards to health 11/29/2012   Smoked 1 1/2 ppd for 20  years quit in roughtly 2007   Headache 11/25/2015   Hematuria 04/09/2014   Hoarseness 02/04/2012   RESOLVED - THOUGHT TO HAVE BEEN ALLERY RELATED   Hyperglycemia 04/06/2014   Hyperlipidemia    Hyperparathyroidism    Hypertension    Obesity, unspecified 05/31/2012   Ocular migraine    Overactive bladder 05/31/2012   Pinched nerve 07/23/2023   Right hip   Preventative health care 02/04/2012   PTSD (post-traumatic stress disorder)    Rapid heart beat    PT FEELS PROB RELATED TO HYPER PARATHYROIDISM    Rheumatoid arthritis (HCC)    SUI (stress urinary incontinence, female) 02/04/2012   Sees Dr Scottie and they have discussed a bladder tack but so far she declines   Walking pneumonia 03/17/2023    Past Surgical History:  Procedure Laterality Date   BREAST EXCISIONAL BIOPSY Left    Age 54 Fibroadenoma   BREAST SURGERY     left breast excision of adenoma   btl     COLONOSCOPY  07/2020   FOOT SURGERY     MASTECTOMY Bilateral 11/28/2021   PARATHYROIDECTOMY Right 08/12/2013   Procedure: right inferior frozen section PARATHYROIDECTOMY;  Surgeon: Krystal CHRISTELLA Spinner, MD;  Location: [facility];  Service: General;  Laterality: Right;   PELVIC LAPAROSCOPY  03/2004   PLACEMENT OF BREAST IMPLANTS  01/15/2023   History of breast cancer   REPLACEMENT TOTAL KNEE Right 08/04/2023   TISSUE EXPANDER PLACEMENT Bilateral 08/14/2022   at [facility]   TOTAL SHOULDER REPLACEMENT Right 01/2019   TUBAL LIGATION     WISDOM TOOTH EXTRACTION  30 yrs ago    Family History  Problem Relation Age of Onset   Hypertension Mother 54       alive   Renal cancer Mother        renal   Hyperlipidemia Mother    Osteoporosis Mother        humerus    Macular degeneration Mother    Tremor Mother    Parkinson's disease Mother    Hypertension Father 54       alive   Atrial fibrillation Father    Colon cancer Father        colon   Hyperlipidemia Father    Stroke Father    Alzheimer's disease Father    Hypertension Sister    Hypertension Brother 69       alive   Tremor Brother    Asthma Son    Eczema Son    Healthy Son    Heart disease Paternal Aunt    Heart disease Paternal Uncle    Lung cancer Maternal Grandfather        lung/ smoker   Emphysema Maternal Grandfather    Heart disease Paternal Grandmother    Other Paternal Grandfather        black lung   Heart disease Paternal Grandfather     Social History   Socioeconomic History   Marital status: Married    Spouse name: Not on file   Number of  children: 2   Years of education: Not on file   Highest education level: Associate degree: academic program  Occupational History   Occupation: Teacher, Adult Education: ADVERTISING COPYWRITER  Tobacco Use   Smoking status: Former    Current packs/day: 0.00    Average packs/day: 1 pack/day for 20.0 years (20.0 ttl pk-yrs)    Types: Cigarettes    Start date: 03/10/1985    Quit date: 03/10/2005  Years since quitting: 19.0    Passive exposure: Never   Smokeless tobacco: Never  Vaping Use   Vaping status: Never Used  Substance and Sexual Activity   Alcohol use: Not Currently   Drug use: No   Sexual activity: Yes    Partners: Male  Other Topics Concern   Not on file  Social History Narrative   Not on file   Social Drivers of Health   Tobacco Use: Medium Risk (03/24/2024)   Patient History    Smoking Tobacco Use: Former    Smokeless Tobacco Use: Never    Passive Exposure: Never  Physicist, Medical Strain: Low Risk (03/17/2024)   Overall Financial Resource Strain (CARDIA)    Difficulty of Paying Living Expenses: Not hard at all  Food Insecurity: No Food Insecurity (03/17/2024)   Epic    Worried About Radiation Protection Practitioner of Food in the Last Year: Never true    Ran Out of Food in the Last Year: Never true  Transportation Needs: No Transportation Needs (03/17/2024)   Epic    Lack of Transportation (Medical): No    Lack of Transportation (Non-Medical): No  Physical Activity: Insufficiently Active (03/17/2024)   Exercise Vital Sign    Days of Exercise per Week: 1 day    Minutes of Exercise per Session: 10 min  Stress: Stress Concern Present (03/17/2024)   [employer] of Occupational Health - Occupational Stress Questionnaire    Feeling of Stress: To some extent  Social Connections: Moderately Integrated (03/17/2024)   Social Connection and Isolation Panel    Frequency of Communication with Friends and Family: More than three times a week    Frequency of Social Gatherings with Friends and Family: Once a  week    Attends Religious Services: 1 to 4 times per year    Active Member of [employer] or Organizations: No    Attends Engineer, Structural: Not on file    Marital Status: Married  Catering Manager Violence: Not At Risk (01/22/2024)   Received from [facility]   HITS    Over the last 12 months how often did your partner physically hurt you?: Never    Over the last 12 months how often did your partner insult you or talk down to you?: Never    Over the last 12 months how often did your partner threaten you with physical harm?: Never    Over the last 12 months how often did your partner scream or curse at you?: Never  Depression (PHQ2-9): Low Risk (03/24/2024)   Depression (PHQ2-9)    PHQ-2 Score: 2  Alcohol Screen: Not on file  Housing: Low Risk (03/17/2024)   Epic    Unable to Pay for Housing in the Last Year: No    Number of Times Moved in the Last Year: 0    Homeless in the Last Year: No  Utilities: Not At Risk (01/22/2024)   Received from [facility]    In the past 12 months has the electric, gas, oil, or water company threatened to shut off services in your home?: No  Health Literacy: Not on file    Outpatient Medications Prior to Visit  Medication Sig Dispense Refill   aspirin 325 MG tablet Take 325 mg by mouth daily.     atorvastatin  (LIPITOR) 80 MG tablet TAKE 1 TABLET BY MOUTH EVERY DAY 90 tablet 1   Blood Glucose Monitoring Suppl (ONETOUCH VERIO FLEX SYSTEM) w/Device KIT Use to check blood glucose 1  to 2 times per day 1 kit 0   Cholecalciferol (VITAMIN D3) 125 MCG (5000 UT) CAPS Take by mouth 2 (two) times a week.     escitalopram  (LEXAPRO ) 10 MG tablet Take 1 tablet (10 mg total) by mouth daily. 90 tablet 1   ezetimibe  (ZETIA ) 10 MG tablet Take 1 tablet (10 mg total) by mouth daily. 90 tablet 1   fenofibrate  micronized (LOFIBRA) 134 MG capsule Take 1 capsule (134 mg total) by mouth daily before breakfast. 90 capsule 0   furosemide  (LASIX ) 20 MG tablet Take  1 tablet (20 mg total) by mouth daily. 90 tablet 1   glucose blood (ONETOUCH VERIO) test strip Use to check blood glucose 1 to 2 times per day 50 each 5   hydrocortisone-pramoxine (ANALPRAM-HC) 2.5-1 % rectal cream as needed.     irbesartan  (AVAPRO ) 150 MG tablet TAKE 1 TABLET BY MOUTH EVERY DAY 90 tablet 1   lactobacillus acidophilus (BACID) TABS tablet Take 2 tablets by mouth 3 (three) times daily.     leflunomide  (ARAVA ) 20 MG tablet TAKE 1 TABLET BY MOUTH EVERY DAY 90 tablet 0   levothyroxine  (SYNTHROID ) 75 MCG tablet TAKE 1 TABLET BY MOUTH EVERY DAY BEFORE BREAKFAST 90 tablet 1   methocarbamol  (ROBAXIN ) 750 MG tablet Take 750 mg by mouth 3 (three) times daily as needed.     metoprolol  succinate (TOPROL -XL) 25 MG 24 hr tablet Take 1 tablet (25 mg total) by mouth daily. TAKE WITH OR IMMEDIATELY FOLLOWING A MEAL. 90 tablet 1   OMEGA-3 FATTY ACIDS PO Take by mouth.     omeprazole  (PRILOSEC) 40 MG capsule TAKE 1 CAPSULE (40 MG TOTAL) BY MOUTH DAILY. 90 capsule 1   ondansetron  (ZOFRAN -ODT) 4 MG disintegrating tablet Take 4 mg by mouth as needed.     OneTouch Delica Lancets 33G MISC Use to check blood glucose 1 to 2 times per day 100 each 5   predniSONE  (DELTASONE ) 5 MG tablet Take 4 tabs po qd x 4 days, 3  tabs po qd x 4 days, 2  tabs po qd x 4 days, 1  tab po qd x 4 days 40 tablet 0   TART CHERRY PO Take by mouth.     tirzepatide  (MOUNJARO ) 15 MG/0.5ML Pen Inject 15 mg into the skin once a week. 6 mL 3   Trospium Chloride 60 MG CP24 trospium ER 60 mg capsule,extended release 24 hr  TAKE 1 CAPSULE BY MOUTH EVERY DAY     TURMERIC PO Take 1 tablet by mouth 2 (two) times daily.     Upadacitinib  ER (RINVOQ ) 15 MG TB24 Take 1 tablet (15 mg total) by mouth daily. 30 tablet 2   verapamil  (CALAN -SR) 120 MG CR tablet Take 1 tablet (120 mg total) by mouth at bedtime. 90 tablet 1   zoledronic  acid (RECLAST ) 5 MG/100ML SOLN injection Inject 5 mg into the vein. Last dose 06/15/2023     ALPRAZolam  (XANAX )  0.25 MG tablet Take 1 tablet (0.25 mg total) by mouth daily as needed. 15 tablet 0   No facility-administered medications prior to visit.    Allergies[1]  Review of Systems  Constitutional:  Positive for malaise/fatigue. Negative for fever.  HENT:  Negative for congestion.   Eyes:  Negative for blurred vision.  Respiratory:  Negative for shortness of breath.   Cardiovascular:  Negative for chest pain, palpitations and leg swelling.  Gastrointestinal:  Negative for abdominal pain, blood in stool and nausea.  Genitourinary:  Negative for  dysuria and frequency.  Musculoskeletal:  Positive for back pain and joint pain. Negative for falls.  Skin:  Negative for rash.  Neurological:  Negative for dizziness, loss of consciousness and headaches.  Endo/Heme/Allergies:  Negative for environmental allergies.  Psychiatric/Behavioral:  Negative for depression. The patient is nervous/anxious and has insomnia.        Objective:    Physical Exam Constitutional:      General: She is not in acute distress.    Appearance: Normal appearance. She is well-developed. She is not toxic-appearing.  HENT:     Head: Normocephalic and atraumatic.     Right Ear: External ear normal.     Left Ear: External ear normal.     Nose: Nose normal.  Eyes:     General:        Right eye: No discharge.        Left eye: No discharge.     Conjunctiva/sclera: Conjunctivae normal.  Neck:     Thyroid : No thyromegaly.  Cardiovascular:     Rate and Rhythm: Normal rate and regular rhythm.     Heart sounds: Normal heart sounds. No murmur heard. Pulmonary:     Effort: Pulmonary effort is normal. No respiratory distress.     Breath sounds: Normal breath sounds.  Abdominal:     General: Bowel sounds are normal.     Palpations: Abdomen is soft.     Tenderness: There is no abdominal tenderness. There is no guarding.  Musculoskeletal:        General: Normal range of motion.     Cervical back: Neck supple.   Lymphadenopathy:     Cervical: No cervical adenopathy.  Skin:    General: Skin is warm and dry.  Neurological:     Mental Status: She is alert and oriented to person, place, and time.  Psychiatric:        Mood and Affect: Mood normal.        Behavior: Behavior normal.        Thought Content: Thought content normal.        Judgment: Judgment normal.     BP 128/86   Pulse 78   Temp 98.3 F (36.8 C)   Resp 16   Ht 5' 5 (1.651 m)   Wt 154 lb (69.9 kg)   LMP 04/01/2011   SpO2 97%   BMI 25.63 kg/m  Wt Readings from Last 3 Encounters:  03/24/24 154 lb (69.9 kg)  12/30/23 156 lb 6.4 oz (70.9 kg)  12/08/23 156 lb 6.4 oz (70.9 kg)    Diabetic Foot Exam - Simple   No data filed    Lab Results  Component Value Date   WBC 5.1 03/24/2024   HGB 11.4 (L) 03/24/2024   HCT 34.8 (L) 03/24/2024   PLT 502.0 (H) 03/24/2024   GLUCOSE 81 03/24/2024   CHOL 189 03/24/2024   TRIG 134.0 03/24/2024   HDL 68.90 03/24/2024   LDLDIRECT 179.0 09/03/2021   LDLCALC 94 03/24/2024   ALT 14 03/24/2024   AST 21 03/24/2024   NA 137 03/24/2024   K 4.5 03/24/2024   CL 101 03/24/2024   CREATININE 0.77 03/24/2024   BUN 17 03/24/2024   CO2 30 03/24/2024   TSH 0.53 03/24/2024   HGBA1C 5.5 03/24/2024   MICROALBUR <0.7 03/24/2024    Lab Results  Component Value Date   TSH 0.53 03/24/2024   Lab Results  Component Value Date   WBC 5.1 03/24/2024   HGB 11.4 (L)  03/24/2024   HCT 34.8 (L) 03/24/2024   MCV 86.3 03/24/2024   PLT 502.0 (H) 03/24/2024   Lab Results  Component Value Date   NA 137 03/24/2024   K 4.5 03/24/2024   CO2 30 03/24/2024   GLUCOSE 81 03/24/2024   BUN 17 03/24/2024   CREATININE 0.77 03/24/2024   BILITOT 0.3 03/24/2024   ALKPHOS 32 (L) 03/24/2024   AST 21 03/24/2024   ALT 14 03/24/2024   PROT 7.0 03/24/2024   ALBUMIN 4.5 03/24/2024   CALCIUM  9.9 03/24/2024   ANIONGAP 10 11/01/2020   EGFR 99 03/07/2024   GFR 82.82 03/24/2024   Lab Results  Component Value  Date   CHOL 189 03/24/2024   Lab Results  Component Value Date   HDL 68.90 03/24/2024   Lab Results  Component Value Date   LDLCALC 94 03/24/2024   Lab Results  Component Value Date   TRIG 134.0 03/24/2024   Lab Results  Component Value Date   CHOLHDL 3 03/24/2024   Lab Results  Component Value Date   HGBA1C 5.5 03/24/2024       Assessment & Plan:  Primary hypertension Assessment & Plan: Well controlled, no changes to meds. Encouraged heart healthy diet such as the DASH diet and exercise as tolerated.   Orders: -     Comprehensive metabolic panel with GFR -     CBC with Differential/Platelet  Mixed hyperlipidemia Assessment & Plan: Encourage heart healthy diet such as MIND or DASH diet, increase exercise, avoid trans fats, simple carbohydrates and processed foods, consider a krill or fish or flaxseed oil cap daily.    Orders: -     Lipid panel  DM type 2 with diabetic mixed hyperlipidemia (HCC) Assessment & Plan: hgba1c acceptable, minimize simple carbs. Increase exercise as tolerated. Great weight loss with mounjaro  and sugars well controlled. Continue to monitor  Orders: -     Hemoglobin A1c -     Microalbumin / creatinine urine ratio  Osteopenia, unspecified location Assessment & Plan: Bone density shows osteopenia, which is thinner than normal but not as bad as osteoporosis. Recommend calcium  intake of 1200 to 1500 mg daily, divided into roughly 3 doses. Best source is the diet and a single dairy serving is about 500 mg, a supplement of calcium  citrate once or twice daily to balance diet is fine if not getting enough in diet. Also need Vitamin D  2000 IU caps, 1 cap daily if not already taking vitamin D . Also recommend weight baring exercise on hips and upper body to keep bones strong.   Muscle cramp Assessment & Plan: Hydrate and monitor    Obesity (BMI 30-39.9) Assessment & Plan: Encouraged DASH or MIND diet, decrease po intake and increase exercise  as tolerated. Needs 7-8 hours of sleep nightly. Avoid trans fats, eat small, frequent meals every 4-5 hours with lean proteins, complex carbs and healthy fats. Minimize simple carbs, high fat foods and processed foods   Thyroid  disease Assessment & Plan: Follows with Dr Faythe, endocrinology. Feels better on Levothyroxine  50 mcg daily  Orders: -     TSH  Hyperparathyroidism, primary Assessment & Plan: Check labs patient asymptomatic   Rheumatoid arthritis, involving unspecified site, unspecified whether rheumatoid factor present [facility]) Assessment & Plan: Continues to follow with Rheumatology   Anemia, unspecified type -     Iron, TIBC and Ferritin Panel  Need for influenza vaccination -     Flu vaccine trivalent PF, 6mos and older(Flulaval,Afluria,Fluarix,Fluzone)    Assessment  and Plan Assessment & Plan Type 2 diabetes mellitus Well-controlled with Mounjaro . Blood glucose levels are consistently in the high 90s to low 100s, with no hypoglycemia. Significant weight loss achieved, contributing to improved glycemic control. - Continue Mounjaro  for weight maintenance and glycemic control. - Monitor blood glucose levels as needed.  Rheumatoid arthritis Managed with current medications. No new symptoms reported. - Continue current rheumatoid arthritis management regimen.  Osteopenia Improved from osteoporosis, likely due to Reclast  treatment. Bone density has improved, and she is engaging in weight-bearing exercises as advised. - Continue Reclast  annually. - Encouraged weight-bearing exercises and adequate vitamin D  intake.  Mixed hyperlipidemia Managed with atorvastatin . Concerns about potential cognitive effects of atorvastatin  were discussed, but benefits outweigh risks given her history of high cholesterol. - Continue atorvastatin . - Ordered lipid panel to monitor cholesterol levels.  Anemia Possibly related to recent orthopedic surgeries. She reports fatigue, which may  be related to anemia. - Ordered CBC and iron panel to assess current anemia status.  Thyroid  disease Managed with levothyroxine . No new symptoms reported. - Continue current thyroid  management regimen.  Insomnia Likely exacerbated by recent surgeries and stress. She uses alprazolam  occasionally for sleep. Discussed potential risks of long-term benzodiazepine use, including habituation and increased fall risk. Recommended cognitive behavioral therapy for insomnia (CBTI) as a non-pharmacological approach. - Refilled alprazolam  prescription for occasional use. - Recommended CBTI app for insomnia management.  General Health Maintenance She is up to date on most vaccinations but has not received the flu shot this year. Discussed the importance of vaccinations, especially given her travel plans and diabetes status. - Administered flu shot today. - Recommended RSV vaccine due to diabetes and travel plans. - Discussed pneumonia booster in the future. - Consider Novavax COVID vaccine for future protection.  Recording duration: 44 minutes     Harlene Horton, MD     [1]  Allergies Allergen Reactions   Cefdinir Other (See Comments) and Nausea And Vomiting    Severe Stomach cramps and diarrhea for several days.   Codeine Nausea And Vomiting   Doxycycline  Diarrhea   Penicillins     RASH, ITCHING   Metformin And Related     Diarrhea    Other Rash   Simvastatin      myalgias   "

## 2024-03-23 NOTE — Assessment & Plan Note (Addendum)
 hgba1c acceptable, minimize simple carbs. Increase exercise as tolerated. Great weight loss with mounjaro  and sugars well controlled. Continue to monitor

## 2024-03-23 NOTE — Assessment & Plan Note (Signed)
 Hydrate and monitor

## 2024-03-23 NOTE — Assessment & Plan Note (Signed)
 Her BMI today is 46. Encouraged ongoing weight-loss efforts, as even modest reductions can significantly improve overall health. Encouraged DASH or MIND diet, decrease po intake and increase exercise as tolerated. Needs 7-8 hours of sleep nightly. Avoid trans fats, eat small, frequent meals every 4-5 hours with lean proteins, complex carbs and healthy fats. Minimize simple carbs, high fat foods and processed foods

## 2024-03-24 ENCOUNTER — Other Ambulatory Visit (HOSPITAL_BASED_OUTPATIENT_CLINIC_OR_DEPARTMENT_OTHER): Payer: Self-pay

## 2024-03-24 ENCOUNTER — Ambulatory Visit: Payer: Self-pay | Admitting: Family Medicine

## 2024-03-24 ENCOUNTER — Other Ambulatory Visit: Payer: Self-pay | Admitting: Family Medicine

## 2024-03-24 ENCOUNTER — Encounter: Payer: Self-pay | Admitting: Family Medicine

## 2024-03-24 VITALS — BP 128/86 | HR 78 | Temp 98.3°F | Resp 16 | Ht 65.0 in | Wt 154.0 lb

## 2024-03-24 DIAGNOSIS — E782 Mixed hyperlipidemia: Secondary | ICD-10-CM

## 2024-03-24 DIAGNOSIS — M8580 Other specified disorders of bone density and structure, unspecified site: Secondary | ICD-10-CM

## 2024-03-24 DIAGNOSIS — D649 Anemia, unspecified: Secondary | ICD-10-CM

## 2024-03-24 DIAGNOSIS — M069 Rheumatoid arthritis, unspecified: Secondary | ICD-10-CM

## 2024-03-24 DIAGNOSIS — I10 Essential (primary) hypertension: Secondary | ICD-10-CM

## 2024-03-24 DIAGNOSIS — E669 Obesity, unspecified: Secondary | ICD-10-CM

## 2024-03-24 DIAGNOSIS — E1169 Type 2 diabetes mellitus with other specified complication: Secondary | ICD-10-CM

## 2024-03-24 DIAGNOSIS — E210 Primary hyperparathyroidism: Secondary | ICD-10-CM

## 2024-03-24 DIAGNOSIS — E079 Disorder of thyroid, unspecified: Secondary | ICD-10-CM

## 2024-03-24 DIAGNOSIS — Z7985 Long-term (current) use of injectable non-insulin antidiabetic drugs: Secondary | ICD-10-CM

## 2024-03-24 DIAGNOSIS — R252 Cramp and spasm: Secondary | ICD-10-CM

## 2024-03-24 DIAGNOSIS — Z23 Encounter for immunization: Secondary | ICD-10-CM

## 2024-03-24 LAB — CBC WITH DIFFERENTIAL/PLATELET
Basophils Absolute: 0 K/uL (ref 0.0–0.1)
Basophils Relative: 0.6 % (ref 0.0–3.0)
Eosinophils Absolute: 0 K/uL (ref 0.0–0.7)
Eosinophils Relative: 0.3 % (ref 0.0–5.0)
HCT: 34.8 % — ABNORMAL LOW (ref 36.0–46.0)
Hemoglobin: 11.4 g/dL — ABNORMAL LOW (ref 12.0–15.0)
Lymphocytes Relative: 46.9 % — ABNORMAL HIGH (ref 12.0–46.0)
Lymphs Abs: 2.4 K/uL (ref 0.7–4.0)
MCHC: 32.7 g/dL (ref 30.0–36.0)
MCV: 86.3 fl (ref 78.0–100.0)
Monocytes Absolute: 0.5 K/uL (ref 0.1–1.0)
Monocytes Relative: 10.2 % (ref 3.0–12.0)
Neutro Abs: 2.1 K/uL (ref 1.4–7.7)
Neutrophils Relative %: 42 % — ABNORMAL LOW (ref 43.0–77.0)
Platelets: 502 K/uL — ABNORMAL HIGH (ref 150.0–400.0)
RBC: 4.04 Mil/uL (ref 3.87–5.11)
RDW: 18.3 % — ABNORMAL HIGH (ref 11.5–15.5)
WBC: 5.1 K/uL (ref 4.0–10.5)

## 2024-03-24 LAB — LIPID PANEL
Cholesterol: 189 mg/dL (ref 28–200)
HDL: 68.9 mg/dL
LDL Cholesterol: 94 mg/dL (ref 10–99)
NonHDL: 120.57
Total CHOL/HDL Ratio: 3
Triglycerides: 134 mg/dL (ref 10.0–149.0)
VLDL: 26.8 mg/dL (ref 0.0–40.0)

## 2024-03-24 LAB — IRON,TIBC AND FERRITIN PANEL
%SAT: 15 % — ABNORMAL LOW (ref 16–45)
Ferritin: 82 ng/mL (ref 16–288)
Iron: 80 ug/dL (ref 45–160)
TIBC: 546 ug/dL — ABNORMAL HIGH (ref 250–450)

## 2024-03-24 LAB — COMPREHENSIVE METABOLIC PANEL WITH GFR
ALT: 14 U/L (ref 3–35)
AST: 21 U/L (ref 5–37)
Albumin: 4.5 g/dL (ref 3.5–5.2)
Alkaline Phosphatase: 32 U/L — ABNORMAL LOW (ref 39–117)
BUN: 17 mg/dL (ref 6–23)
CO2: 30 meq/L (ref 19–32)
Calcium: 9.9 mg/dL (ref 8.4–10.5)
Chloride: 101 meq/L (ref 96–112)
Creatinine, Ser: 0.77 mg/dL (ref 0.40–1.20)
GFR: 82.82 mL/min
Glucose, Bld: 81 mg/dL (ref 70–99)
Potassium: 4.5 meq/L (ref 3.5–5.1)
Sodium: 137 meq/L (ref 135–145)
Total Bilirubin: 0.3 mg/dL (ref 0.2–1.2)
Total Protein: 7 g/dL (ref 6.0–8.3)

## 2024-03-24 LAB — TSH: TSH: 0.53 u[IU]/mL (ref 0.35–5.50)

## 2024-03-24 LAB — MICROALBUMIN / CREATININE URINE RATIO
Creatinine,U: 20.5 mg/dL
Microalb Creat Ratio: UNDETERMINED mg/g (ref 0.0–30.0)
Microalb, Ur: <0.7 mg/dL

## 2024-03-24 LAB — HEMOGLOBIN A1C: Hgb A1c MFr Bld: 5.5 % (ref 4.6–6.5)

## 2024-03-24 MED ORDER — RSVPREF3 VAC RECOMB ADJUVANTED 120 MCG/0.5ML IM SUSR
0.5000 mL | Freq: Once | INTRAMUSCULAR | 0 refills | Status: AC
  Filled 2024-03-24: qty 0.5, 1d supply, fill #0

## 2024-03-24 MED ORDER — ALPRAZOLAM 0.25 MG PO TABS: 0.2500 mg | ORAL_TABLET | Freq: Every day | ORAL | 0 refills | Status: AC | PRN

## 2024-03-24 NOTE — Patient Instructions (Addendum)
 [facility]  Send us  a mychart message for refills late February for March 1  ACP documents for the chart  Novavax is the new COVID vaccine  Insomnia is a sleep disorder that makes it difficult to fall asleep or stay asleep. Insomnia can cause fatigue, low energy, difficulty concentrating, mood swings, and poor performance at work or school. There are three different ways to classify insomnia: Difficulty falling asleep. Difficulty staying asleep. Waking up too early in the morning. Any type of insomnia can be long-term (chronic) or short-term (acute). Both are common. Short-term insomnia usually lasts for 3 months or less. Chronic insomnia occurs at least three times a week for longer than 3 months. What are the causes? Insomnia may be caused by another condition, situation, or substance, such as: Having certain mental health conditions, such as anxiety and depression. Using caffeine, alcohol, tobacco, or drugs. Having gastrointestinal conditions, such as gastroesophageal reflux disease (GERD). Having certain medical conditions. These include: Asthma. Alzheimer's disease. Stroke. Chronic pain. An overactive thyroid  gland (hyperthyroidism). Other sleep disorders, such as restless legs syndrome and sleep apnea. Menopause. Sometimes, the cause of insomnia may not be known. What increases the risk? Risk factors for insomnia include: Gender. Females are affected more often than males. Age. Insomnia is more common as people get older. Stress and certain medical and mental health conditions. Lack of exercise. Having an irregular work schedule. This may include working night shifts and traveling between different time zones. What are the signs or symptoms? If you have insomnia, the main symptom is having trouble falling asleep or having trouble staying asleep. This may lead to other symptoms, such as: Feeling tired or having low energy. Feeling nervous about going  to sleep. Not feeling rested in the morning. Having trouble concentrating. Feeling irritable, anxious, or depressed. How is this diagnosed? This condition may be diagnosed based on: Your symptoms and medical history. Your health care provider may ask about: Your sleep habits. Any medical conditions you have. Your mental health. A physical exam. How is this treated? Treatment for insomnia depends on the cause. Treatment may focus on treating an underlying condition that is causing the insomnia. Treatment may also include: Medicines to help you sleep. Counseling or therapy. Lifestyle adjustments to help you sleep better. Follow these instructions at home: Eating and drinking  Limit or avoid alcohol, caffeinated beverages, and products that contain nicotine and tobacco, especially close to bedtime. These can disrupt your sleep. Do not eat a large meal or eat spicy foods right before bedtime. This can lead to digestive discomfort that can make it hard for you to sleep. Sleep habits  Keep a sleep diary to help you and your health care provider figure out what could be causing your insomnia. Write down: When you sleep. When you wake up during the night. How well you sleep and how rested you feel the next day. Any side effects of medicines you are taking. What you eat and drink. Make your bedroom a dark, comfortable place where it is easy to fall asleep. Put up shades or blackout curtains to block light from outside. Use a white noise machine to block noise. Keep the temperature cool. Limit screen use before bedtime. This includes: Not watching TV. Not using your smartphone, tablet, or computer. Stick to a routine that includes going to bed and waking up at the same times every day and night. This can help you fall asleep faster. Consider making a quiet activity, such as  reading, part of your nighttime routine. Try to avoid taking naps during the day so that you sleep better at  night. Get out of bed if you are still awake after 15 minutes of trying to sleep. Keep the lights down, but try reading or doing a quiet activity. When you feel sleepy, go back to bed. General instructions Take over-the-counter and prescription medicines only as told by your health care provider. Exercise regularly as told by your health care provider. However, avoid exercising in the hours right before bedtime. Use relaxation techniques to manage stress. Ask your health care provider to suggest some techniques that may work well for you. These may include: Breathing exercises. Routines to release muscle tension. Visualizing peaceful scenes. Make sure that you drive carefully. Do not drive if you feel very sleepy. Keep all follow-up visits. This is important. Contact a health care provider if: You are tired throughout the day. You have trouble in your daily routine due to sleepiness. You continue to have sleep problems, or your sleep problems get worse. Get help right away if: You have thoughts about hurting yourself or someone else. Get help right away if you feel like you may hurt yourself or others, or have thoughts about taking your own life. Go to your nearest emergency room or: Call 911. Call the National Suicide Prevention Lifeline at 435 370 9671 or 988. This is open 24 hours a day. Text the Crisis Text Line at 867-566-5940. Summary Insomnia is a sleep disorder that makes it difficult to fall asleep or stay asleep. Insomnia can be long-term (chronic) or short-term (acute). Treatment for insomnia depends on the cause. Treatment may focus on treating an underlying condition that is causing the insomnia. Keep a sleep diary to help you and your health care provider figure out what could be causing your insomnia. This information is not intended to replace advice given to you by your health care provider. Make sure you discuss any questions you have with your health care provider. Document  Revised: 02/04/2021 Document Reviewed: 02/04/2021 [employer] Patient Education  2024 [employer].

## 2024-03-25 ENCOUNTER — Encounter: Payer: Self-pay | Admitting: Family Medicine

## 2024-04-05 ENCOUNTER — Other Ambulatory Visit (HOSPITAL_COMMUNITY): Payer: Self-pay

## 2024-04-09 ENCOUNTER — Other Ambulatory Visit: Payer: Self-pay | Admitting: Family Medicine

## 2024-04-28 ENCOUNTER — Other Ambulatory Visit: Admitting: Pharmacist

## 2024-05-05 ENCOUNTER — Ambulatory Visit: Admitting: Physician Assistant

## 2024-09-26 ENCOUNTER — Encounter: Payer: Self-pay | Admitting: Family Medicine

## 2024-09-27 ENCOUNTER — Encounter: Payer: Self-pay | Admitting: Student

## 2024-09-28 ENCOUNTER — Encounter: Payer: Self-pay | Admitting: Student
